# Patient Record
Sex: Male | Born: 1940 | Race: White | Hispanic: No | Marital: Married | Smoking: Former smoker
Health system: Southern US, Community
[De-identification: ages and names within clinical notes are randomized; demographics above are authoritative.]

## PROBLEM LIST (undated history)

## (undated) DIAGNOSIS — I509 Heart failure, unspecified: Secondary | ICD-10-CM

## (undated) DIAGNOSIS — I2510 Atherosclerotic heart disease of native coronary artery without angina pectoris: Secondary | ICD-10-CM

## (undated) DIAGNOSIS — I779 Disorder of arteries and arterioles, unspecified: Secondary | ICD-10-CM

## (undated) DIAGNOSIS — I5020 Unspecified systolic (congestive) heart failure: Secondary | ICD-10-CM

## (undated) DIAGNOSIS — I255 Ischemic cardiomyopathy: Secondary | ICD-10-CM

## (undated) DIAGNOSIS — I447 Left bundle-branch block, unspecified: Secondary | ICD-10-CM

## (undated) DIAGNOSIS — Z87891 Personal history of nicotine dependence: Secondary | ICD-10-CM

## (undated) DIAGNOSIS — Z5189 Encounter for other specified aftercare: Secondary | ICD-10-CM

## (undated) DIAGNOSIS — R011 Cardiac murmur, unspecified: Secondary | ICD-10-CM

## (undated) DIAGNOSIS — R001 Bradycardia, unspecified: Secondary | ICD-10-CM

## (undated) DIAGNOSIS — I739 Peripheral vascular disease, unspecified: Secondary | ICD-10-CM

## (undated) DIAGNOSIS — I771 Stricture of artery: Secondary | ICD-10-CM

## (undated) DIAGNOSIS — I350 Nonrheumatic aortic (valve) stenosis: Secondary | ICD-10-CM

## (undated) DIAGNOSIS — E119 Type 2 diabetes mellitus without complications: Secondary | ICD-10-CM

## (undated) DIAGNOSIS — S2239XA Fracture of one rib, unspecified side, initial encounter for closed fracture: Secondary | ICD-10-CM

## (undated) DIAGNOSIS — K922 Gastrointestinal hemorrhage, unspecified: Secondary | ICD-10-CM

## (undated) DIAGNOSIS — D689 Coagulation defect, unspecified: Secondary | ICD-10-CM

## (undated) DIAGNOSIS — I10 Essential (primary) hypertension: Secondary | ICD-10-CM

## (undated) DIAGNOSIS — D649 Anemia, unspecified: Secondary | ICD-10-CM

## (undated) HISTORY — DX: Ischemic cardiomyopathy: I25.5

## (undated) HISTORY — DX: Peripheral vascular disease, unspecified: I73.9

## (undated) HISTORY — PX: CORONARY ANGIOPLASTY: SHX604

## (undated) HISTORY — DX: Encounter for other specified aftercare: Z51.89

## (undated) HISTORY — DX: Atherosclerotic heart disease of native coronary artery without angina pectoris: I25.10

## (undated) HISTORY — DX: Disorder of arteries and arterioles, unspecified: I77.9

## (undated) HISTORY — DX: Cardiac murmur, unspecified: R01.1

## (undated) HISTORY — DX: Personal history of nicotine dependence: Z87.891

## (undated) HISTORY — DX: Gastrointestinal hemorrhage, unspecified: K92.2

## (undated) HISTORY — DX: Bradycardia, unspecified: R00.1

## (undated) HISTORY — DX: Left bundle-branch block, unspecified: I44.7

## (undated) HISTORY — DX: Coagulation defect, unspecified: D68.9

## (undated) HISTORY — DX: Nonrheumatic aortic (valve) stenosis: I35.0

## (undated) HISTORY — DX: Unspecified systolic (congestive) heart failure: I50.20

## (undated) HISTORY — PX: CARDIAC CATHETERIZATION: SHX172

---
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
## 2009-05-06 ENCOUNTER — Emergency Department: Payer: Self-pay | Admitting: Emergency Medicine

## 2010-02-01 DIAGNOSIS — F1011 Alcohol abuse, in remission: Secondary | ICD-10-CM | POA: Insufficient documentation

## 2010-06-18 ENCOUNTER — Emergency Department: Payer: Self-pay | Admitting: Emergency Medicine

## 2011-02-13 DIAGNOSIS — L219 Seborrheic dermatitis, unspecified: Secondary | ICD-10-CM | POA: Insufficient documentation

## 2011-08-05 ENCOUNTER — Emergency Department: Payer: Self-pay

## 2011-08-07 ENCOUNTER — Emergency Department: Payer: Self-pay | Admitting: Emergency Medicine

## 2015-12-14 DIAGNOSIS — I6523 Occlusion and stenosis of bilateral carotid arteries: Secondary | ICD-10-CM | POA: Insufficient documentation

## 2015-12-14 DIAGNOSIS — IMO0001 Reserved for inherently not codable concepts without codable children: Secondary | ICD-10-CM | POA: Insufficient documentation

## 2016-02-09 DIAGNOSIS — R001 Bradycardia, unspecified: Secondary | ICD-10-CM | POA: Insufficient documentation

## 2016-04-04 DIAGNOSIS — N400 Enlarged prostate without lower urinary tract symptoms: Secondary | ICD-10-CM | POA: Insufficient documentation

## 2016-06-25 DIAGNOSIS — L570 Actinic keratosis: Secondary | ICD-10-CM | POA: Insufficient documentation

## 2017-10-13 ENCOUNTER — Emergency Department: Payer: Medicare Other

## 2017-10-13 ENCOUNTER — Other Ambulatory Visit: Payer: Self-pay

## 2017-10-13 ENCOUNTER — Encounter: Payer: Self-pay | Admitting: Emergency Medicine

## 2017-10-13 ENCOUNTER — Emergency Department
Admission: EM | Admit: 2017-10-13 | Discharge: 2017-10-13 | Disposition: A | Discharge status: Home / Self Care | Payer: Medicare Other | Attending: Emergency Medicine | Admitting: Emergency Medicine

## 2017-10-13 DIAGNOSIS — Z7984 Long term (current) use of oral hypoglycemic drugs: Secondary | ICD-10-CM | POA: Insufficient documentation

## 2017-10-13 DIAGNOSIS — M25461 Effusion, right knee: Secondary | ICD-10-CM | POA: Diagnosis not present

## 2017-10-13 DIAGNOSIS — E119 Type 2 diabetes mellitus without complications: Secondary | ICD-10-CM | POA: Insufficient documentation

## 2017-10-13 DIAGNOSIS — W08XXXA Fall from other furniture, initial encounter: Secondary | ICD-10-CM | POA: Insufficient documentation

## 2017-10-13 DIAGNOSIS — I10 Essential (primary) hypertension: Secondary | ICD-10-CM | POA: Diagnosis not present

## 2017-10-13 DIAGNOSIS — Z79899 Other long term (current) drug therapy: Secondary | ICD-10-CM | POA: Insufficient documentation

## 2017-10-13 DIAGNOSIS — Z87891 Personal history of nicotine dependence: Secondary | ICD-10-CM | POA: Diagnosis not present

## 2017-10-13 DIAGNOSIS — M25561 Pain in right knee: Secondary | ICD-10-CM | POA: Diagnosis present

## 2017-10-13 HISTORY — DX: Essential (primary) hypertension: I10

## 2017-10-13 HISTORY — DX: Type 2 diabetes mellitus without complications: E11.9

## 2017-10-13 HISTORY — DX: Fracture of one rib, unspecified side, initial encounter for closed fracture: S22.39XA

## 2017-10-13 MED ORDER — TRAMADOL HCL 50 MG PO TABS: 50.0000 mg | ORAL_TABLET | Freq: Four times a day (QID) | ORAL | 0 refills | Status: DC | PRN

## 2017-10-13 NOTE — ED Provider Notes (Signed)
[facility] Emergency Department Provider Note ____________________________________________  Time seen: Approximately 12:56 PM  I have reviewed the triage vital signs and the nursing notes.   HISTORY  Chief Complaint Fall and Knee Injury    HPI Kevin Case is a 76 y.o. male who presents to the emergency department for evaluation and treatment after sustaining a mechanical, non-syncopal fall 5 days ago.  While inside hanging Christmas lights, he fell off of a bench and landed on a hardwood floor.  His back initially hurt as well, however that has since resolved but the knee continues to be painful and is still swollen.  He took an ibuprofen this morning, but otherwise has not taken any medications for his pain.  He attempted to apply neoprene knee sleeve, but removed it because it made the knee more painful. Past Medical History:  Diagnosis Date  . Diabetes mellitus without complication (HCC)   . Hypertension   . Rib fracture     There are no active problems to display for this patient.   History reviewed. No pertinent surgical history.  Prior to Admission medications   Medication Sig Start Date End Date Taking? Authorizing Provider  hydrochlorothiazide (HYDRODIURIL) 25 MG tablet Take 25 mg by mouth daily.   Yes [provider]  metFORMIN (GLUCOPHAGE) 500 MG tablet Take 500 mg by mouth 2 (two) times daily with a meal.   Yes [provider]  omeprazole (PRILOSEC) 20 MG capsule Take 20 mg by mouth daily.   Yes [provider]  traMADol (ULTRAM) 50 MG tablet Take 1 tablet (50 mg total) by mouth every 6 (six) hours as needed. 10/13/17   Chinita Pester, FNP    Allergies Patient has no known allergies.  No family history on file.  Social History Social History   Tobacco Use  . Smoking status: Former Games developer  . Smokeless tobacco: Never Used  Substance Use Topics  . Alcohol use: No    Frequency: Never  . Drug use: Not on  file    Review of Systems Constitutional: Positive for recent injury Cardiovascular: Negative for chest pain Respiratory: Negative for shortness of breath Musculoskeletal: Positive for right knee pain Skin: Positive for swelling of the right knee  Neurological: Negative for loss of consciousness  ____________________________________________   PHYSICAL EXAM:  VITAL SIGNS: ED Triage Vitals  Enc Vitals Group     BP 10/13/17 1140 (!) 135/52     Pulse Rate 10/13/17 1140 66     Resp 10/13/17 1140 16     Temp 10/13/17 1140 97.6 F (36.4 C)     Temp Source 10/13/17 1140 Oral     SpO2 10/13/17 1140 100 %     Weight 10/13/17 1140 157 lb (71.2 kg)     Height 10/13/17 1140 5\' 9"  (1.753 m)     Head Circumference --      Peak Flow --      Pain Score 10/13/17 1139 3     Pain Loc --      Pain Edu? --      Excl. in GC? --     Constitutional: Alert and oriented. Well appearing and in no acute distress. Eyes: Conjunctivae are clear without discharge or drainage Head: Atraumatic Neck: Supple with full range of motion observed Respiratory: Respirations even and unlabored Musculoskeletal: Palpable prepatellar effusion of the right knee without obvious deformity. Neurologic: Sensation is intact. Skin: Intact Psychiatric: Affect and behavior are appropriate.  ____________________________________________   LABS (all  labs ordered are listed, but only abnormal results are displayed)  Labs Reviewed - No data to display ____________________________________________  RADIOLOGY  Image of the right knee is negative for acute bony abnormality per radiology. ____________________________________________   PROCEDURES  .Splint Application Date/Time: 10/13/2017 3:54 PM Performed by: Chinita Pesterriplett, Jacobi Nile B, FNP Authorized by: Chinita Pesterriplett, Pauline Pegues B, FNP   Consent:    Consent obtained:  Verbal   Consent given by:  Patient   Risks discussed:  Pain   Alternatives discussed:  Referral Pre-procedure  details:    Sensation:  Normal Procedure details:    Laterality:  Right   Location:  Knee   Splint type:  Knee immobilizer Post-procedure details:    Pain:  Unchanged   Sensation:  Normal   Patient tolerance of procedure:  Tolerated well, no immediate complications   ____________________________________________   INITIAL IMPRESSION / ASSESSMENT AND PLAN / ED COURSE  [name] is a 76 y.o. male who presents to the emergency department 5 days after a mechanical, non-syncopal fall.  X-rays do not reveal acute bony abnormality.  Knee immobilizer to be applied and the patient was advised to rest, ice, and elevate the right lower extremity for the next several days.  He was instructed to call and schedule an appointment with orthopedics if he has not had any improvement over the next 5-7 days.  He is currently using a cane and was encouraged to continue to do so.  He was advised to follow-up with his primary care provider or return to the emergency department for symptoms of concern if he is unable to see orthopedics.  Medications - No data to display  Pertinent labs & imaging results that were available during my care of the patient were reviewed by me and considered in my medical decision making (see chart for details).  _________________________________________   FINAL CLINICAL IMPRESSION(S) / ED DIAGNOSES  Final diagnoses:  Effusion of right knee    ED Discharge Orders        Ordered    traMADol (ULTRAM) 50 MG tablet  Every 6 hours PRN     10/13/17 1255       If controlled substance prescribed during this visit, 12 month history viewed on the [facility] prior to issuing an initial prescription for Schedule II or III opiod.    Chinita Pesterriplett, Berish Bohman B, FNP 10/13/17 1600    Sharman CheekStafford, Phillip, MD 10/15/17 2324

## 2017-10-13 NOTE — ED Triage Notes (Signed)
Says fell off a bench while hanging christmas decorations about 5 days ago.  Says his back got better with heat, but his right knee is still swollen.

## 2017-10-13 NOTE — ED Notes (Signed)
Pt ambulatory with cane and knee immobilizer to wheelchair. Pt states "this is 100% better with this brace on." Pt and family verbalized understanding of discharge instructions, follow-up care and prescription. VSS. Skin warm and dry. A&O x4. This RN helped pt to family vehicle.

## 2017-10-13 NOTE — ED Notes (Signed)
See triage note  Presents with pain and swelling to right knee s/p fall about 5 days ago   States he fell on wood floor

## 2018-01-29 DIAGNOSIS — E039 Hypothyroidism, unspecified: Secondary | ICD-10-CM | POA: Insufficient documentation

## 2018-01-29 DIAGNOSIS — E038 Other specified hypothyroidism: Secondary | ICD-10-CM | POA: Insufficient documentation

## 2018-11-24 DIAGNOSIS — K219 Gastro-esophageal reflux disease without esophagitis: Secondary | ICD-10-CM | POA: Insufficient documentation

## 2019-03-12 ENCOUNTER — Emergency Department: Payer: Medicare Other

## 2019-03-12 ENCOUNTER — Other Ambulatory Visit: Payer: Self-pay

## 2019-03-12 ENCOUNTER — Inpatient Hospital Stay
Admission: EM | Admit: 2019-03-12 | Discharge: 2019-03-16 | DRG: 280 | Disposition: A | Discharge status: Home / Self Care | Payer: Medicare Other | Attending: Family Medicine | Admitting: Family Medicine

## 2019-03-12 DIAGNOSIS — J9601 Acute respiratory failure with hypoxia: Secondary | ICD-10-CM | POA: Diagnosis present

## 2019-03-12 DIAGNOSIS — I2510 Atherosclerotic heart disease of native coronary artery without angina pectoris: Secondary | ICD-10-CM | POA: Diagnosis present

## 2019-03-12 DIAGNOSIS — I429 Cardiomyopathy, unspecified: Secondary | ICD-10-CM | POA: Diagnosis present

## 2019-03-12 DIAGNOSIS — N179 Acute kidney failure, unspecified: Secondary | ICD-10-CM | POA: Diagnosis present

## 2019-03-12 DIAGNOSIS — I350 Nonrheumatic aortic (valve) stenosis: Secondary | ICD-10-CM | POA: Diagnosis present

## 2019-03-12 DIAGNOSIS — Z87891 Personal history of nicotine dependence: Secondary | ICD-10-CM

## 2019-03-12 DIAGNOSIS — I5021 Acute systolic (congestive) heart failure: Secondary | ICD-10-CM

## 2019-03-12 DIAGNOSIS — E119 Type 2 diabetes mellitus without complications: Secondary | ICD-10-CM | POA: Diagnosis present

## 2019-03-12 DIAGNOSIS — R0602 Shortness of breath: Secondary | ICD-10-CM

## 2019-03-12 DIAGNOSIS — I708 Atherosclerosis of other arteries: Secondary | ICD-10-CM | POA: Diagnosis present

## 2019-03-12 DIAGNOSIS — I110 Hypertensive heart disease with heart failure: Secondary | ICD-10-CM | POA: Diagnosis present

## 2019-03-12 DIAGNOSIS — I5031 Acute diastolic (congestive) heart failure: Secondary | ICD-10-CM | POA: Diagnosis not present

## 2019-03-12 DIAGNOSIS — Z8249 Family history of ischemic heart disease and other diseases of the circulatory system: Secondary | ICD-10-CM | POA: Diagnosis not present

## 2019-03-12 DIAGNOSIS — I214 Non-ST elevation (NSTEMI) myocardial infarction: Principal | ICD-10-CM

## 2019-03-12 DIAGNOSIS — Z7984 Long term (current) use of oral hypoglycemic drugs: Secondary | ICD-10-CM

## 2019-03-12 DIAGNOSIS — E785 Hyperlipidemia, unspecified: Secondary | ICD-10-CM | POA: Diagnosis present

## 2019-03-12 DIAGNOSIS — I5043 Acute on chronic combined systolic (congestive) and diastolic (congestive) heart failure: Secondary | ICD-10-CM | POA: Diagnosis present

## 2019-03-12 DIAGNOSIS — J811 Chronic pulmonary edema: Secondary | ICD-10-CM

## 2019-03-12 DIAGNOSIS — I447 Left bundle-branch block, unspecified: Secondary | ICD-10-CM | POA: Diagnosis present

## 2019-03-12 DIAGNOSIS — Z5320 Procedure and treatment not carried out because of patient's decision for unspecified reasons: Secondary | ICD-10-CM | POA: Diagnosis present

## 2019-03-12 DIAGNOSIS — I959 Hypotension, unspecified: Secondary | ICD-10-CM | POA: Diagnosis not present

## 2019-03-12 DIAGNOSIS — Z20828 Contact with and (suspected) exposure to other viral communicable diseases: Secondary | ICD-10-CM | POA: Diagnosis present

## 2019-03-12 DIAGNOSIS — R7989 Other specified abnormal findings of blood chemistry: Secondary | ICD-10-CM | POA: Diagnosis not present

## 2019-03-12 LAB — BLOOD GAS, ARTERIAL
Acid-base deficit: 3 mmol/L — ABNORMAL HIGH (ref 0.0–2.0)
Bicarbonate: 21.1 mmol/L (ref 20.0–28.0)
Delivery systems: POSITIVE
Expiratory PAP: 6
FIO2: 50
Inspiratory PAP: 12
O2 Saturation: 95.4 %
Patient temperature: 37
pCO2 arterial: 34 mmHg (ref 32.0–48.0)
pH, Arterial: 7.4 (ref 7.350–7.450)
pO2, Arterial: 78 mmHg — ABNORMAL LOW (ref 83.0–108.0)

## 2019-03-12 LAB — CBC WITH DIFFERENTIAL/PLATELET
Abs Immature Granulocytes: 0.04 10*3/uL (ref 0.00–0.07)
Basophils Absolute: 0 10*3/uL (ref 0.0–0.1)
Basophils Relative: 0 %
Eosinophils Absolute: 0.3 10*3/uL (ref 0.0–0.5)
Eosinophils Relative: 2 %
HCT: 36 % — ABNORMAL LOW (ref 39.0–52.0)
Hemoglobin: 10.9 g/dL — ABNORMAL LOW (ref 13.0–17.0)
Immature Granulocytes: 0 %
Lymphocytes Relative: 26 %
Lymphs Abs: 2.7 10*3/uL (ref 0.7–4.0)
MCH: 25.3 pg — ABNORMAL LOW (ref 26.0–34.0)
MCHC: 30.3 g/dL (ref 30.0–36.0)
MCV: 83.5 fL (ref 80.0–100.0)
Monocytes Absolute: 0.9 10*3/uL (ref 0.1–1.0)
Monocytes Relative: 8 %
Neutro Abs: 6.4 10*3/uL (ref 1.7–7.7)
Neutrophils Relative %: 64 %
Platelets: 218 10*3/uL (ref 150–400)
RBC: 4.31 MIL/uL (ref 4.22–5.81)
RDW: 15.2 % (ref 11.5–15.5)
WBC: 10.3 10*3/uL (ref 4.0–10.5)
nRBC: 0 % (ref 0.0–0.2)

## 2019-03-12 LAB — COMPREHENSIVE METABOLIC PANEL
ALT: 34 U/L (ref 0–44)
AST: 49 U/L — ABNORMAL HIGH (ref 15–41)
Albumin: 3.5 g/dL (ref 3.5–5.0)
Alkaline Phosphatase: 67 U/L (ref 38–126)
Anion gap: 11 (ref 5–15)
BUN: 26 mg/dL — ABNORMAL HIGH (ref 8–23)
CO2: 19 mmol/L — ABNORMAL LOW (ref 22–32)
Calcium: 8.3 mg/dL — ABNORMAL LOW (ref 8.9–10.3)
Chloride: 110 mmol/L (ref 98–111)
Creatinine, Ser: 1.27 mg/dL — ABNORMAL HIGH (ref 0.61–1.24)
GFR calc Af Amer: >60 mL/min (ref >60)
GFR calc non Af Amer: 55 mL/min — ABNORMAL LOW (ref >60)
Glucose, Bld: 188 mg/dL — ABNORMAL HIGH (ref 70–99)
Potassium: 3.7 mmol/L (ref 3.5–5.1)
Sodium: 140 mmol/L (ref 135–145)
Total Bilirubin: 0.9 mg/dL (ref 0.3–1.2)
Total Protein: 7.3 g/dL (ref 6.5–8.1)

## 2019-03-12 LAB — BRAIN NATRIURETIC PEPTIDE: B Natriuretic Peptide: 864 pg/mL — ABNORMAL HIGH (ref 0.0–100.0)

## 2019-03-12 LAB — ETHANOL: Alcohol, Ethyl (B): <10 mg/dL (ref <10)

## 2019-03-12 LAB — TROPONIN I
Troponin I: 0.06 ng/mL (ref <0.03)
Troponin I: 1.6 ng/mL (ref <0.03)

## 2019-03-12 LAB — GLUCOSE, CAPILLARY: Glucose-Capillary: 138 mg/dL — ABNORMAL HIGH (ref 70–99)

## 2019-03-12 LAB — SARS CORONAVIRUS 2 BY RT PCR (HOSPITAL ORDER, PERFORMED IN ~~LOC~~ HOSPITAL LAB): SARS Coronavirus 2: NEGATIVE

## 2019-03-12 LAB — PROCALCITONIN: Procalcitonin: <0.1 ng/mL

## 2019-03-12 LAB — PROTIME-INR
INR: 1.1 (ref 0.8–1.2)
Prothrombin Time: 14.4 seconds (ref 11.4–15.2)

## 2019-03-12 LAB — APTT: aPTT: 27 seconds (ref 24–36)

## 2019-03-12 MED ORDER — ACETAMINOPHEN 650 MG RE SUPP: 650.0000 mg | Freq: Four times a day (QID) | RECTAL | Status: DC | PRN

## 2019-03-12 MED ORDER — ONDANSETRON HCL 4 MG/2ML IJ SOLN: 4.0000 mg | Freq: Four times a day (QID) | INTRAMUSCULAR | Status: DC | PRN

## 2019-03-12 MED ORDER — ENOXAPARIN SODIUM 40 MG/0.4ML ~~LOC~~ SOLN
40.0000 mg | SUBCUTANEOUS | Status: DC
  Administered 2019-03-12: 40 mg via SUBCUTANEOUS
  Filled 2019-03-12: qty 0.4

## 2019-03-12 MED ORDER — ONDANSETRON HCL 4 MG PO TABS: 4.0000 mg | ORAL_TABLET | Freq: Four times a day (QID) | ORAL | Status: DC | PRN

## 2019-03-12 MED ORDER — ACETAMINOPHEN 325 MG PO TABS
650.0000 mg | ORAL_TABLET | Freq: Four times a day (QID) | ORAL | Status: DC | PRN
  Administered 2019-03-14 (×2): 650 mg via ORAL
  Filled 2019-03-12 (×2): qty 2

## 2019-03-12 MED ORDER — INSULIN ASPART 100 UNIT/ML ~~LOC~~ SOLN
0.0000 [IU] | Freq: Three times a day (TID) | SUBCUTANEOUS | Status: DC
  Administered 2019-03-13: 2 [IU] via SUBCUTANEOUS
  Administered 2019-03-13 – 2019-03-14 (×2): 1 [IU] via SUBCUTANEOUS
  Administered 2019-03-14: 3 [IU] via SUBCUTANEOUS
  Administered 2019-03-14 – 2019-03-16 (×3): 1 [IU] via SUBCUTANEOUS
  Filled 2019-03-12 (×8): qty 1

## 2019-03-12 MED ORDER — ATORVASTATIN CALCIUM 20 MG PO TABS
80.0000 mg | ORAL_TABLET | Freq: Every day | ORAL | Status: DC
  Administered 2019-03-13 – 2019-03-15 (×3): 80 mg via ORAL
  Filled 2019-03-12 (×3): qty 4

## 2019-03-12 MED ORDER — ASPIRIN EC 81 MG PO TBEC
162.0000 mg | DELAYED_RELEASE_TABLET | Freq: Once | ORAL | Status: AC
  Administered 2019-03-12: 162 mg via ORAL

## 2019-03-12 MED ORDER — FUROSEMIDE 10 MG/ML IJ SOLN
40.0000 mg | Freq: Every day | INTRAMUSCULAR | Status: DC
  Administered 2019-03-13: 40 mg via INTRAVENOUS
  Filled 2019-03-12: qty 4

## 2019-03-12 MED ORDER — FUROSEMIDE 10 MG/ML IJ SOLN
40.0000 mg | Freq: Once | INTRAMUSCULAR | Status: AC
  Administered 2019-03-12: 40 mg via INTRAVENOUS
  Filled 2019-03-12: qty 4

## 2019-03-12 MED ORDER — HYDRALAZINE HCL 20 MG/ML IJ SOLN: 5.0000 mg | INTRAMUSCULAR | Status: DC | PRN

## 2019-03-12 MED ORDER — PANTOPRAZOLE SODIUM 40 MG PO TBEC
80.0000 mg | DELAYED_RELEASE_TABLET | Freq: Every day | ORAL | Status: DC
  Administered 2019-03-13 – 2019-03-16 (×3): 80 mg via ORAL
  Filled 2019-03-12 (×3): qty 2

## 2019-03-12 MED ORDER — INSULIN ASPART 100 UNIT/ML ~~LOC~~ SOLN: 0.0000 [IU] | Freq: Every day | SUBCUTANEOUS | Status: DC

## 2019-03-12 MED ORDER — POLYETHYLENE GLYCOL 3350 17 G PO PACK: 17.0000 g | PACK | Freq: Every day | ORAL | Status: DC | PRN

## 2019-03-12 MED ORDER — HEPARIN (PORCINE) 25000 UT/250ML-% IV SOLN
1050.0000 [IU]/h | INTRAVENOUS | Status: DC
  Administered 2019-03-12 – 2019-03-14 (×3): 1050 [IU]/h via INTRAVENOUS
  Filled 2019-03-12 (×3): qty 250

## 2019-03-12 NOTE — ED Notes (Signed)
Pt taken off bipap, on 4L [place] at 94% sats

## 2019-03-12 NOTE — ED Notes (Signed)
Pt placed on Bipap

## 2019-03-12 NOTE — ED Notes (Signed)
ED TO INPATIENT HANDOFF REPORT  ED Nurse Name and Phone #: Berline Lopes [phone]  S Name/Age/Gender Kevin Case 78 y.o. male Room/Bed: ED01A/ED01A  Code Status   Code Status: Not on file  Home/SNF/Other Home Patient oriented to: self, place, time and situation Is this baseline? Yes   Triage Complete: Triage complete  Chief Complaint CODE STEMI  Triage Note Pt to ED via EMS from home with c/o chest pain sob, and cough for 2-3 days. Upon EMS arrival to pts home pt was in respiratory distress and in flash pulmonary edema after walking up set of stairs. Pt has hx LBBB, pt was given 1 inch nitro paste and fentanyl by ems. Pt placed on NRB 15L by ems not on o2 chronically, ems states sats were in the 70s on their arrival. Pt arrives at 92% NRB, MD McShane at bedside orders for biapap.    Allergies No Known Allergies  Level of Care/Admitting Diagnosis ED Disposition    ED Disposition Condition Comment   Admit  Hospital Area: [facility] [[id number]]  Level of Care: Telemetry [5]  Covid Evaluation: N/A  Diagnosis: Acute respiratory failure with hypoxia [facility]) [[id number]]  Admitting Physician: Willadean Carol DODD [[id number]]  Attending Physician: Willadean Carol DODD [[id number]]  Estimated length of stay: past midnight tomorrow  Certification:: I certify this patient will need inpatient services for at least 2 midnights  PT Class (Do Not Modify): Inpatient [101]  PT Acc Code (Do Not Modify): Private [1]       B Medical/Surgery History Past Medical History:  Diagnosis Date  . Diabetes mellitus without complication (HCC)   . Hypertension   . Rib fracture    History reviewed. No pertinent surgical history.   A IV Location/Drains/Wounds Patient Lines/Drains/Airways Status   Active Line/Drains/Airways    Name:   Placement date:   Placement time:   Site:   Days:   Peripheral IV 03/12/19 Left Forearm   03/12/19    1653    Forearm   less than 1   Peripheral IV  03/12/19 Right Forearm   03/12/19    1653    Forearm   less than 1          Intake/Output Last 24 hours No intake or output data in the 24 hours ending 03/12/19 1831  Labs/Imaging Results for orders placed or performed during the hospital encounter of 03/12/19 (from the past 48 hour(s))  CBC with Differential     Status: Abnormal   Collection Time: 03/12/19  4:44 PM  Result Value Ref Range   WBC 10.3 4.0 - 10.5 K/uL   RBC 4.31 4.22 - 5.81 MIL/uL   Hemoglobin 10.9 (L) 13.0 - 17.0 g/dL   HCT 92.4 (L) 46.2 - 86.3 %   MCV 83.5 80.0 - 100.0 fL   MCH 25.3 (L) 26.0 - 34.0 pg   MCHC 30.3 30.0 - 36.0 g/dL   RDW 81.7 71.1 - 65.7 %   Platelets 218 150 - 400 K/uL   nRBC 0.0 0.0 - 0.2 %   Neutrophils Relative % 64 %   Neutro Abs 6.4 1.7 - 7.7 K/uL   Lymphocytes Relative 26 %   Lymphs Abs 2.7 0.7 - 4.0 K/uL   Monocytes Relative 8 %   Monocytes Absolute 0.9 0.1 - 1.0 K/uL   Eosinophils Relative 2 %   Eosinophils Absolute 0.3 0.0 - 0.5 K/uL   Basophils Relative 0 %   Basophils Absolute 0.0 0.0 - 0.1 K/uL  Immature Granulocytes 0 %   Abs Immature Granulocytes 0.04 0.00 - 0.07 K/uL    Comment: Performed at [facility], [address] [place], [place] [address]  Troponin I - Once     Status: Abnormal   Collection Time: 03/12/19  4:44 PM  Result Value Ref Range   Troponin I 0.06 (HH) <0.03 ng/mL    Comment: CRITICAL RESULT CALLED TO, READ BACK BY AND VERIFIED WITH GRACIE [facility] AT 1724 03/12/2019.PMF Performed at [facility], [address] [place], [place] [address]   Comprehensive metabolic panel     Status: Abnormal   Collection Time: 03/12/19  4:44 PM  Result Value Ref Range   Sodium 140 135 - 145 mmol/L   Potassium 3.7 3.5 - 5.1 mmol/L   Chloride 110 98 - 111 mmol/L   CO2 19 (L) 22 - 32 mmol/L   Glucose, Bld 188 (H) 70 - 99 mg/dL   BUN 26 (H) 8 - 23 mg/dL   Creatinine, Ser 7.94 (H) 0.61 - 1.24 mg/dL   Calcium 8.3 (L) 8.9 - 10.3 mg/dL   Total Protein 7.3  6.5 - 8.1 g/dL   Albumin 3.5 3.5 - 5.0 g/dL   AST 49 (H) 15 - 41 U/L   ALT 34 0 - 44 U/L   Alkaline Phosphatase 67 38 - 126 U/L   Total Bilirubin 0.9 0.3 - 1.2 mg/dL   GFR calc non Af Amer 55 (L) >60 mL/min   GFR calc Af Amer >60 >60 mL/min   Anion gap 11 5 - 15    Comment: Performed at [facility], [address], [place], [place] [address]  Brain natriuretic peptide     Status: Abnormal   Collection Time: 03/12/19  4:44 PM  Result Value Ref Range   B Natriuretic Peptide 864.0 (H) 0.0 - 100.0 pg/mL    Comment: Performed at [facility], [address] [place], [place] [address]  Protime-INR     Status: None   Collection Time: 03/12/19  4:44 PM  Result Value Ref Range   Prothrombin Time 14.4 11.4 - 15.2 seconds   INR 1.1 0.8 - 1.2    Comment: (NOTE) INR goal varies based on device and disease states. Performed at [facility], [address] [place], [place] [address]   APTT     Status: None   Collection Time: 03/12/19  4:44 PM  Result Value Ref Range   aPTT 27 24 - 36 seconds    Comment: Performed at [facility], [address] [place], [place] [address]  Ethanol     Status: None   Collection Time: 03/12/19  4:44 PM  Result Value Ref Range   Alcohol, Ethyl (B) <10 <10 mg/dL    Comment: (NOTE) Lowest detectable limit for serum alcohol is 10 mg/dL. For medical purposes only. Performed at [facility], [address] [place], [place] [address]   Blood gas, arterial     Status: Abnormal   Collection Time: 03/12/19  5:10 PM  Result Value Ref Range   FIO2 50.00    Delivery systems BILEVEL POSITIVE AIRWAY PRESSURE    Inspiratory PAP 12    Expiratory PAP 6    pH, Arterial 7.40 7.350 - 7.450   pCO2 arterial 34 32.0 - 48.0 mmHg   pO2, Arterial 78 (L) 83.0 - 108.0 mmHg   Bicarbonate 21.1 20.0 - 28.0 mmol/L   Acid-base deficit 3.0 (H) 0.0 - 2.0 mmol/L  O2 Saturation 95.4 %   Patient temperature 37.0    Collection site  LEFT RADIAL    Sample type ARTERIAL DRAW    Allens test (pass/fail) PASS PASS    Comment: Performed at [facility], [address] [place] [place], [place] [address]   Dg Chest Port 1 View  Result Date: 03/12/2019 CLINICAL DATA:  Acute chest pain and cough. EXAM: PORTABLE CHEST 1 VIEW COMPARISON:  08/07/2011 FINDINGS: Diffuse bilateral airspace opacities are noted. The cardiomediastinal silhouette is unremarkable. There may be trace bilateral pleural effusions present. No pneumothorax or acute bony abnormality. IMPRESSION: Diffuse bilateral airspace opacities which may represent diffuse edema versus infection. Electronically Signed   By: Harmon Pier M.D.   On: 03/12/2019 17:11    Pending Labs Unresulted Labs (From admission, onward)    Start     Ordered   03/12/19 1643  SARS Coronavirus 2 (CEPHEID - Performed in [facility] hospital lab), Hosp Order  (Asymptomatic Patients Labs)  Once,   STAT    Question:  Rule Out  Answer:  Yes   03/12/19 1642   Signed and Held  Basic metabolic panel  Tomorrow morning,   R     Signed and Held   Signed and Held  CBC  Tomorrow morning,   R     Signed and Held   Signed and Held  Troponin I - Now Then Q6H  Now then every 6 hours,   R     Signed and Held   Signed and Held  Procalcitonin - Baseline  ONCE - STAT,   STAT     Signed and Held          Vitals/Pain Today's Vitals   03/12/19 1639 03/12/19 1640 03/12/19 1642 03/12/19 1700  BP:  139/84  110/73  Pulse:    88  Resp:    (!) 27  Temp:   97.8 F (36.6 C)   TempSrc:   Oral   SpO2:    96%  Weight: 73.9 kg     Height:  (1.753 m)     PainSc: 3        Isolation Precautions No active isolations  Medications Medications  aspirin EC tablet 162 mg (162 mg Oral Given 03/12/19 1653)  furosemide (LASIX) injection 40 mg (40 mg Intravenous Given 03/12/19 1746)    Mobility walks with person assist Low fall risk   Focused Assessments Pulmonary Assessment Handoff:  Lung sounds: Bilateral  Breath Sounds: Rales O2 Device: Bi-PAP        R Recommendations: See Admitting Provider Note  Report given to:   Additional Notes:

## 2019-03-12 NOTE — ED Provider Notes (Addendum)
[facility] Emergency Department Provider Note  ____________________________________________   I have reviewed the triage vital signs and the nursing notes. Where available I have reviewed prior notes and, if possible and indicated, outside hospital notes.    HISTORY  Chief Complaint Shortness of Breath    HPI Kevin Case is a 78 y.o. male  With a history of diabetes mellitus hypertension left bundle branch block, who also has a remote history of EtOH abuse presents today complaining of shortness of breath and chest pain which is a pressure, nonradiating, has been there off and on for the last 3 days.  Exertional.  Now constant for the last several hours.  No nausea no vomiting.  No leg swelling.  Is on Lasix.  He is somewhat limited due to patient respiratory status.  EMS found him with a oxygen saturation in the 80s or 70s gave him a nonrebreather, he is come off and is oxygen, he is also found to actually hypertensive for them: They did give him fentanyl, they gave him nitroglycerin, he took 2 baby aspirin's prior to EMS arrival.  Patient rates his pain down to about a 2 or 3 at this time and feels better although he still having difficulty breathing he feels that is improved as well.  No fever, no productive cough.  No known COVID exposures.  EKG was transmitted by EMS and reviewed by Dr. Kirke Corin.  Who felt that this was consistent with a old left bundle branch block and did not wish to activate cath lab.  Patient is full code. His med list that he brought with him including his medications does show Lasix which it appears that he is been taking but he states he is not on a fluid pill or on Lasix.  However Lasix is 1 of his meds  Past Medical History:  Diagnosis Date  . Diabetes mellitus without complication (HCC)   . Hypertension   . Rib fracture     There are no active problems to display for this patient.   History reviewed. No pertinent surgical  history.  Prior to Admission medications   Medication Sig Start Date End Date Taking? Authorizing Provider  hydrochlorothiazide (HYDRODIURIL) 25 MG tablet Take 25 mg by mouth daily.    [provider]  metFORMIN (GLUCOPHAGE) 500 MG tablet Take 500 mg by mouth 2 (two) times daily with a meal.    [provider]  omeprazole (PRILOSEC) 20 MG capsule Take 20 mg by mouth daily.    [provider]  traMADol (ULTRAM) 50 MG tablet Take 1 tablet (50 mg total) by mouth every 6 (six) hours as needed. 10/13/17   Chinita Pester, FNP    Allergies Patient has no known allergies.  No family history on file.  Social History Social History   Tobacco Use  . Smoking status: Former Games developer  . Smokeless tobacco: Never Used  Substance Use Topics  . Alcohol use: No    Frequency: Never  . Drug use: Not on file    Review of Systems Constitutional: No fever/chills Eyes: No visual changes. ENT: No sore throat. No stiff neck no neck pain Cardiovascular: + chest pain. Respiratory: + shortness of breath. Gastrointestinal:   no vomiting.  No diarrhea.  No constipation. Genitourinary: Negative for dysuria. Musculoskeletal: Negative lower extremity swelling Skin: Negative for rash. Neurological: Negative for severe headaches, focal weakness or numbness.   ____________________________________________   PHYSICAL EXAM:  VITAL SIGNS: ED Triage Vitals  Enc Vitals  Group     BP 03/12/19 1640 139/84     Pulse Rate 03/12/19 1638 (!) 102     Resp 03/12/19 1638 20     Temp 03/12/19 1642 97.8 F (36.6 C)     Temp Source 03/12/19 1642 Oral     SpO2 03/12/19 1638 94 %     Weight 03/12/19 1639 163 lb (73.9 kg)     Height 03/12/19 1639  (1.753 m)     Head Circumference --      Peak Flow --      Pain Score 03/12/19 1639 3     Pain Loc --      Pain Edu? --      Excl. in GC? --     Constitutional: Alert and oriented.  Patient is working to breathe, but speaking in 4 word  sentences. Eyes: Conjunctivae are normal Head: Atraumatic HEENT: No congestion/rhinnorhea. Mucous membranes are moist.  Oropharynx non-erythematous Neck:   Nontender with no meningismus, no masses, no stridor Cardiovascular: Normal rate, regular rhythm. Grossly normal heart sounds.  Good peripheral circulation. Respiratory: Excess respiratory effort.  No retractions.  Sounds wet diffuse Rales Abdominal: Soft and nontender. No distention. No guarding no rebound Back:  There is no focal tenderness or step off.  there is no midline tenderness there are no lesions noted. there is no CVA tenderness Musculoskeletal: No lower extremity tenderness, no upper extremity tenderness. No joint effusions, no DVT signs strong distal pulses no edema Neurologic:  Normal speech and language. No gross focal neurologic deficits are appreciated.  Skin:  Skin is warm, dry and intact. No rash noted. Psychiatric: Mood and affect are somewhat anxious. Speech and behavior are normal.  ____________________________________________   LABS (all labs ordered are listed, but only abnormal results are displayed)  Labs Reviewed  SARS CORONAVIRUS 2 (HOSPITAL ORDER, PERFORMED IN [facility])  CBC WITH DIFFERENTIAL/PLATELET  TROPONIN I  COMPREHENSIVE METABOLIC PANEL  BRAIN NATRIURETIC PEPTIDE  PROTIME-INR  APTT  ETHANOL    Pertinent labs  results that were available during my care of the patient were reviewed by me and considered in my medical decision making (see chart for details). ____________________________________________  EKG  I personally interpreted any EKGs ordered by me or triage Left bundle branch block rate 101, sinus tach, ____________________________________________  RADIOLOGY  Pertinent labs & imaging results that were available during my care of the patient were reviewed by me and considered in my medical decision making (see chart for details). If possible, patient and/or family  made aware of any abnormal findings.  No results found. ____________________________________________    PROCEDURES  Procedure(s) performed: None  Procedures  Critical Care performed: CRITICAL CARE Performed by: Jeanmarie Plant   Total critical care time: 55 minutes  Critical care time was exclusive of separately billable procedures and treating other patients.  Critical care was necessary to treat or prevent imminent or life-threatening deterioration.  Critical care was time spent personally by me on the following activities: development of treatment plan with patient and/or surrogate as well as nursing, discussions with consultants, evaluation of patient's response to treatment, examination of patient, obtaining history from patient or surrogate, ordering and performing treatments and interventions, ordering and review of laboratory studies, ordering and review of radiographic studies, pulse oximetry and re-evaluation of patient's condition.   ____________________________________________   INITIAL IMPRESSION / ASSESSMENT AND PLAN / ED COURSE  Pertinent labs & imaging results that were available during my care of the  patient were reviewed by me and considered in my medical decision making (see chart for details).  Patient here with chest pain shortness of breath and what it likely is pulmonary edema on clinical exam, feeling much better after EMS interventions.  Appreciate their hard work.  Patient is satting in the mid 90s on nonrebreather here.  I have again discussed with on-call cardiology.  They do not feel that he needs to go to the Cath Lab at this time after evaluating his EKG.  We did discuss his pain and his symptoms and history.  Dr. Kirke Corin asked me to defer heparin until we get the patient's enzymes given 3 days of symptoms.  We will do so.  Low suspicion for dissection given this history of intermittent pain worse with exertion, and it is certainly not possible infectious  etiology is present but I think that is not likely to be the first on the list.  In addition, patient does have no history of PE or DVT in the past he has diffuse altered lung sounds and chest pain, again more likely to be CHF.  However, we will continually reassess the need for further evaluation as patient progresses here.  We are putting him on BiPAP given his work of breathing which I think will help him.  Because of the pandemic we are sending COVID, though it is not first on my list of differentials.   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  I did discuss again with cardiology.  Again  can appreciate consult.  He does not want to heparinize the patient at this time he agrees with management and Lasix administration which we will give.  If patient's pressure starts to trend down that he is much more relaxed, is my hope that we can perhaps remove some of the nitroglycerin and get him off of the BiPAP.    ____________________________________________   FINAL CLINICAL IMPRESSION(S) / ED DIAGNOSES  Final diagnoses:  SOB (shortness of breath)      This chart was dictated using voice recognition software.  Despite best efforts to proofread,  errors can occur which can change meaning.      Jeanmarie Plant, MD 03/12/19 1654    Jeanmarie Plant, MD 03/12/19 1747    Jeanmarie Plant, MD 03/12/19 [phone]

## 2019-03-12 NOTE — H&P (Addendum)
[employer] [facility] [place] [facility]   PATIENT NAME: Kevin Case    MR#:  [id number]  DATE OF BIRTH:  1940-11-29  DATE OF ADMISSION:  03/12/2019  PRIMARY CARE PHYSICIAN: Ebbie Ridge, PA-C   REQUESTING/REFERRING PHYSICIAN: Ileana Roup, MD  CHIEF COMPLAINT:   Chief Complaint  Patient presents with  . Shortness of Breath    HISTORY OF PRESENT ILLNESS:  Kevin Case  is a 78 y.o. male with a known history of hypertension and diabetes who presented to the ED with gradually worsening shortness of breath over the last 3 days.  He states that his shortness of breath was so bad this morning that it "brought him to his knees".  He also endorses some worsening lower extremity edema over the last couple days.  He denies any orthopnea, cough, fevers, chills.  He endorses some "chest tightness", which has completely resolved.  In the ED, he had significantly increased work of breathing and was placed on nonrebreather mask and then BiPAP.  His work of breathing significantly improved, and he was weaned back to nasal cannula.  Labs are significant for creatinine 1.27, BNP 864, troponin 0.06. Chest x-ray with diffuse airspace opacities, likely diffuse pulmonary edema.  He was given Lasix IV x1 and hospitalists were called for admission.  PAST MEDICAL HISTORY:   Past Medical History:  Diagnosis Date  . Diabetes mellitus without complication (HCC)   . Hypertension   . Rib fracture     PAST SURGICAL HISTORY:  History reviewed. No pertinent surgical history.  SOCIAL HISTORY:   Social History   Tobacco Use  . Smoking status: Former Games developer  . Smokeless tobacco: Never Used  Substance Use Topics  . Alcohol use: No    Frequency: Never    FAMILY HISTORY:  Father-heart attack and pacemaker  DRUG ALLERGIES:  No Known Allergies  REVIEW OF SYSTEMS:   Review of Systems  Constitutional: Negative for chills and fever.  HENT: Negative for congestion and sore  throat.   Eyes: Negative for blurred vision and double vision.  Respiratory: Positive for shortness of breath. Negative for cough.   Cardiovascular: Positive for leg swelling. Negative for chest pain and palpitations.  Gastrointestinal: Negative for nausea and vomiting.  Genitourinary: Negative for dysuria and urgency.  Musculoskeletal: Negative for back pain and neck pain.  Neurological: Negative for dizziness and headaches.  Psychiatric/Behavioral: Negative for depression. The patient is not nervous/anxious.     MEDICATIONS AT HOME:   Prior to Admission medications   Medication Sig Start Date End Date Taking? Authorizing Provider  atorvastatin (LIPITOR) 80 MG tablet Take 1 tablet by mouth at bedtime. 01/05/19  Yes [provider]  furosemide (LASIX) 20 MG tablet Take 1 tablet by mouth daily. 12/14/18  Yes [provider]  JARDIANCE 10 MG TABS tablet Take 10 mg by mouth daily. 02/24/19  Yes [provider]  losartan (COZAAR) 25 MG tablet Take 25 mg by mouth daily. 10/27/18  Yes [provider]  omeprazole (PRILOSEC) 40 MG capsule Take 40 mg by mouth daily. 01/26/19  Yes [provider]  traMADol (ULTRAM) 50 MG tablet Take 1 tablet (50 mg total) by mouth every 6 (six) hours as needed. Patient not taking: Reported on 03/12/2019 10/13/17   Kem Boroughs B, FNP      VITAL SIGNS:  Blood pressure 110/73, pulse 88, temperature 97.8 F (36.6 C), temperature source Oral, resp. rate (!) 27, height 5\' 9"  (1.753 m), weight 73.9 kg, SpO2  96 %.  PHYSICAL EXAMINATION:  Physical Exam  GENERAL:  78 y.o.-year-old patient lying in the bed with no acute distress.  EYES: Pupils equal, round, reactive to light and accommodation. No scleral icterus. Extraocular muscles intact.  HEENT: Head atraumatic, normocephalic. Oropharynx and nasopharynx clear.  NECK:  Supple, no jugular venous distention. No thyroid enlargement, no tenderness.  LUNGS: + Bibasilar crackles  present.  BiPAP in place.  Able to speak in full sentences.  No use of accessory muscles of respiration.  CARDIOVASCULAR: RRR, S1, S2 normal. No murmurs, rubs, or gallops.  ABDOMEN: Soft, nontender, nondistended. Bowel sounds present. No organomegaly or mass.  EXTREMITIES: No cyanosis, or clubbing. + Trace peripheral edema bilaterally. NEUROLOGIC: Cranial nerves II through XII are intact. Muscle strength 5/5 in all extremities. Sensation intact. Gait not checked.  PSYCHIATRIC: The patient is alert and oriented x 3.  SKIN: No obvious rash, lesion, or ulcer.   LABORATORY PANEL:   CBC Recent Labs  Lab 03/12/19 1644  WBC 10.3  HGB 10.9*  HCT 36.0*  PLT 218   ------------------------------------------------------------------------------------------------------------------  Chemistries  Recent Labs  Lab 03/12/19 1644  NA 140  K 3.7  CL 110  CO2 19*  GLUCOSE 188*  BUN 26*  CREATININE 1.27*  CALCIUM 8.3*  AST 49*  ALT 34  ALKPHOS 67  BILITOT 0.9   ------------------------------------------------------------------------------------------------------------------  Cardiac Enzymes Recent Labs  Lab 03/12/19 1644  TROPONINI 0.06*   ------------------------------------------------------------------------------------------------------------------  RADIOLOGY:  Dg Chest Port 1 View  Result Date: 03/12/2019 CLINICAL DATA:  Acute chest pain and cough. EXAM: PORTABLE CHEST 1 VIEW COMPARISON:  08/07/2011 FINDINGS: Diffuse bilateral airspace opacities are noted. The cardiomediastinal silhouette is unremarkable. There may be trace bilateral pleural effusions present. No pneumothorax or acute bony abnormality. IMPRESSION: Diffuse bilateral airspace opacities which may represent diffuse edema versus infection. Electronically Signed   By: Harmon Pier M.D.   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  Chest x-ray with diffuse opacities, likely diffuse pulmonary edema.  Initially requiring BiPAP, but transitioned to nasal cannula.  Last ECHO 2017 with normal EF and grade 1 diastolic dysfunction. -COVID negative -Wean O2 as able -IV Lasix 40mg  daily -[facility] cardiology consulted -Check ECHO -Check procalcitonin -Daily weights, strict I/O  Elevated troponin- likely demand ischemia in the setting of CHF exacerbation and AKI.  No active chest pain. -Trend troponins -No heparin per cardiology recommendations, unless troponins trend up.  AKI- creatinine 1.27, baseline 0.8-1.0. -Will need to monitor kidney function closely with IV diuresis -Hold home losartan for now -Avoid nephrotoxic agents -Recheck kidney function in the morning  Hypertension-blood pressures normal in the ED -Holding home losartan -Hydralazine IV as needed  Type 2 diabetes- blood sugars elevated in the ED -SSI  Hyperlipidemia-stable -Continue home Lipitor  All the records are reviewed and case discussed with ED provider. Management plans discussed with the patient, family and they are in agreement.  CODE STATUS: DNI  TOTAL TIME TAKING CARE OF THIS PATIENT: 45 minutes.    Jinny Blossom Mayo M.D on 03/12/2019 at 5:51 PM  Between 7am to 6pm - Pager - [phone]  After 6pm go to [email or web address] - Social research officer, government  [facility]  Office  [phone]  CC: Primary care physician; Ebbie Ridge, PA-C   Note: This dictation was prepared with Dragon dictation along with smaller phrase technology. Any transcriptional errors that result from  this process are unintentional.

## 2019-03-12 NOTE — Progress Notes (Signed)
Family Meeting Note  Advance Directive:no  Today a meeting took place with the Patient.  Patient is able to participate.  The following clinical team members were present during this meeting:MD  The following were discussed:Patient's diagnosis: CHF exacerbation, Patient's progosis: Unable to determine and Goals for treatment: DNI   Patient states that he would never want to be on a ventilator machine, even for a short period of time.  He has been on BiPAP in the ED, and states he would be okay being on this in the future.  He is okay with cardiac resuscitation. Will make patient DNI.  Additional follow-up to be provided: prn  Time spent during discussion:20 minutes  Kevin Sinclair, MD

## 2019-03-12 NOTE — ED Notes (Signed)
Pts wife updated at this time.

## 2019-03-12 NOTE — Progress Notes (Signed)
ANTICOAGULATION CONSULT NOTE - Initial Consult  Pharmacy Consult for heparin drip Indication: chest pain/ACS  No Known Allergies  Patient Measurements: Height: 5\' 9"  (175.3 cm) Weight: 161 lb 8 oz (73.3 kg) IBW/kg (Calculated) : 70.7 Heparin Dosing Weight: 73 kg  Vital Signs: Temp: 98.9 F (37.2 C) (05/08 2019) Temp Source: Oral (05/08 2019) BP: 128/80 (05/08 2019) Pulse Rate: 82 (05/08 2019)  Labs: Recent Labs    03/12/19 1644 03/12/19 2034  HGB 10.9*  --   HCT 36.0*  --   PLT 218  --   APTT 27  --   LABPROT 14.4  --   INR 1.1  --   CREATININE 1.27*  --   TROPONINI 0.06* 1.60*    Estimated Creatinine Clearance: 50.3 mL/min (A) (by C-G formula based on SCr of 1.27 mg/dL (H)).   Medical History: Past Medical History:  Diagnosis Date  . Diabetes mellitus without complication (HCC)   . Hypertension   . Rib fracture     Medications:  Scheduled:  . atorvastatin  80 mg Oral QHS  . [START ON 03/13/2019] furosemide  40 mg Intravenous Daily  . insulin aspart  0-5 Units Subcutaneous QHS  . [START ON 03/13/2019] insulin aspart  0-9 Units Subcutaneous TID WC  . [START ON 03/13/2019] pantoprazole  80 mg Oral Daily    Assessment: Patient was admitted for SOB was found to have an initial trop of 0.06 then >> 1.6. EKG shows QRS widening and QT prolongation. Patient is being started on heparin for NSTEMI Patient is not on any PTA anticoagulation. Patient received one dose of lovenox 40 mg subq x 1  Goal of Therapy:  Heparin level 0.3-0.7 units/ml Monitor platelets by anticoagulation protocol: Yes   Plan:  Will omit bolus considering patient received lovenox x 1 @ 2059 Will start heparin drip at 1050 units/hr  Baseline labs WNL, will check anti-Xa @ 0600. Will monitor daily CBC's, adjust per anti-Xa levels and will monitor normalization of trops.  Thomasene Ripple, PharmD, BCPS Clinical Pharmacist 03/12/2019

## 2019-03-12 NOTE — ED Triage Notes (Signed)
Pt to ED via EMS from home with c/o chest pain sob, and cough for 2-3 days. Upon EMS arrival to pts home pt was in respiratory distress and in flash pulmonary edema after walking up set of stairs. Pt has hx LBBB, pt was given 1 inch nitro paste and fentanyl by ems. Pt placed on NRB 15L by ems not on o2 chronically, ems states sats were in the 70s on their arrival. Pt arrives at 92% NRB, MD McShane at bedside orders for biapap.

## 2019-03-13 ENCOUNTER — Inpatient Hospital Stay: Payer: Medicare Other

## 2019-03-13 ENCOUNTER — Encounter: Payer: Self-pay | Admitting: Cardiovascular Disease

## 2019-03-13 ENCOUNTER — Inpatient Hospital Stay (HOSPITAL_COMMUNITY)
Admit: 2019-03-13 | Discharge: 2019-03-13 | Disposition: A | Discharge status: Home / Self Care | Payer: Medicare Other | Attending: Internal Medicine | Admitting: Internal Medicine

## 2019-03-13 DIAGNOSIS — I447 Left bundle-branch block, unspecified: Secondary | ICD-10-CM

## 2019-03-13 DIAGNOSIS — R7989 Other specified abnormal findings of blood chemistry: Secondary | ICD-10-CM

## 2019-03-13 DIAGNOSIS — J9601 Acute respiratory failure with hypoxia: Secondary | ICD-10-CM

## 2019-03-13 DIAGNOSIS — I5031 Acute diastolic (congestive) heart failure: Secondary | ICD-10-CM

## 2019-03-13 LAB — COMPREHENSIVE METABOLIC PANEL
ALT: 30 U/L (ref 0–44)
AST: 50 U/L — ABNORMAL HIGH (ref 15–41)
Albumin: 2.8 g/dL — ABNORMAL LOW (ref 3.5–5.0)
Alkaline Phosphatase: 54 U/L (ref 38–126)
Anion gap: 6 (ref 5–15)
BUN: 26 mg/dL — ABNORMAL HIGH (ref 8–23)
CO2: 23 mmol/L (ref 22–32)
Calcium: 8.2 mg/dL — ABNORMAL LOW (ref 8.9–10.3)
Chloride: 111 mmol/L (ref 98–111)
Creatinine, Ser: 1.4 mg/dL — ABNORMAL HIGH (ref 0.61–1.24)
GFR calc Af Amer: 57 mL/min — ABNORMAL LOW (ref >60)
GFR calc non Af Amer: 49 mL/min — ABNORMAL LOW (ref >60)
Glucose, Bld: 126 mg/dL — ABNORMAL HIGH (ref 70–99)
Potassium: 3.8 mmol/L (ref 3.5–5.1)
Sodium: 140 mmol/L (ref 135–145)
Total Bilirubin: 0.7 mg/dL (ref 0.3–1.2)
Total Protein: 6.2 g/dL — ABNORMAL LOW (ref 6.5–8.1)

## 2019-03-13 LAB — CBC
HCT: 29.9 % — ABNORMAL LOW (ref 39.0–52.0)
Hemoglobin: 9.1 g/dL — ABNORMAL LOW (ref 13.0–17.0)
MCH: 25.1 pg — ABNORMAL LOW (ref 26.0–34.0)
MCHC: 30.4 g/dL (ref 30.0–36.0)
MCV: 82.6 fL (ref 80.0–100.0)
Platelets: 176 10*3/uL (ref 150–400)
RBC: 3.62 MIL/uL — ABNORMAL LOW (ref 4.22–5.81)
RDW: 15.4 % (ref 11.5–15.5)
WBC: 10.1 10*3/uL (ref 4.0–10.5)
nRBC: 0 % (ref 0.0–0.2)

## 2019-03-13 LAB — HEPARIN LEVEL (UNFRACTIONATED)
Heparin Unfractionated: 0.61 IU/mL (ref 0.30–0.70)
Heparin Unfractionated: 0.61 IU/mL (ref 0.30–0.70)

## 2019-03-13 LAB — GLUCOSE, CAPILLARY
Glucose-Capillary: 132 mg/dL — ABNORMAL HIGH (ref 70–99)
Glucose-Capillary: 195 mg/dL — ABNORMAL HIGH (ref 70–99)
Glucose-Capillary: 154 mg/dL — ABNORMAL HIGH (ref 70–99)
Glucose-Capillary: 113 mg/dL — ABNORMAL HIGH (ref 70–99)

## 2019-03-13 LAB — TROPONIN I
Troponin I: 3.53 ng/mL (ref <0.03)
Troponin I: 3.44 ng/mL (ref <0.03)

## 2019-03-13 MED ORDER — IPRATROPIUM-ALBUTEROL 0.5-2.5 (3) MG/3ML IN SOLN
3.0000 mL | Freq: Three times a day (TID) | RESPIRATORY_TRACT | Status: DC
  Administered 2019-03-13 – 2019-03-16 (×7): 3 mL via RESPIRATORY_TRACT
  Filled 2019-03-13 (×7): qty 3

## 2019-03-13 MED ORDER — SODIUM CHLORIDE 0.9 % IV BOLUS: 250.0000 mL | Freq: Once | INTRAVENOUS | Status: DC

## 2019-03-13 MED ORDER — ALBUTEROL SULFATE (2.5 MG/3ML) 0.083% IN NEBU
2.5000 mg | INHALATION_SOLUTION | RESPIRATORY_TRACT | Status: DC | PRN
  Administered 2019-03-14: 2.5 mg via RESPIRATORY_TRACT
  Filled 2019-03-13: qty 3

## 2019-03-13 NOTE — Progress Notes (Signed)
The patient presented with chest pain and heart failure symptoms . Initially, a code STEMI was called by EMS due to LBBB but this was determined to be old . Improved with dieresis . Troponin trended up to 3.5. Heparin drip was started . Echo is pending .  Will likely need a right and left heart cath on Monday .  Full consult to follow by our team.

## 2019-03-13 NOTE — Consult Note (Signed)
Cardiology Consultation:   Patient ID: Kevin Case MRN: [id number]; DOB: [birth year]  Admit date: 03/12/2019 Date of Consult: 03/13/2019  Primary Care Provider: Ebbie Ridge, PA-C Primary Cardiologist: [facility]  Primary Electrophysiologist:  None    Patient Profile:   Kevin Case is a 78 y.o. male with a hx of bradycardia, hypertension, left bundle branch block hyperlipidemia.  He is typically followed at [facility].  Who is being seen today for the evaluation of chest pain and CHF  at the request of  Dr. Amado Coe. Marland Kitchen  History of Present Illness:   Kevin Case is a 78 year old gentleman regularly followed by the cardiology department at [facility].  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  He also has a history of hypertension, hyperlipidemia.  The patient has had worsening shortness of breath over the past 2 weeks.  Acutely worse 2-3 day s ago .  Severe DOE with any activity .     Yesterday morning it was so bad that it brought him to his knees.  He has had some chest discomfort.   Does not get any regular exercise  No weight loss Has some leg swelling , wears compression hose   He denies any fevers or chills. He denies any nausea vomiting or diarrhea.  He presented to the emergency room and was found to have significant pulmonary edema.  He received IV Lasix and has improved. He is diuresed 580 cc so far this admission.  No fever, covid test negative on 03/12/19    Past Medical History:  Diagnosis Date  . Diabetes mellitus without complication (HCC)   . Hypertension   . Rib fracture     History reviewed. No pertinent surgical history.   Home Medications:  Prior to Admission medications   Medication Sig Start Date End Date Taking? Authorizing Provider  atorvastatin (LIPITOR) 80 MG tablet Take 1 tablet by mouth at bedtime. 01/05/19  Yes [provider]  furosemide (LASIX) 20 MG tablet Take 1 tablet by mouth  daily. 12/14/18  Yes [provider]  JARDIANCE 10 MG TABS tablet Take 10 mg by mouth daily. 02/24/19  Yes [provider]  losartan (COZAAR) 25 MG tablet Take 25 mg by mouth daily. 10/27/18  Yes [provider]  omeprazole (PRILOSEC) 40 MG capsule Take 40 mg by mouth daily. 01/26/19  Yes [provider]  traMADol (ULTRAM) 50 MG tablet Take 1 tablet (50 mg total) by mouth every 6 (six) hours as needed. Patient not taking: Reported on 03/12/2019 10/13/17   Chinita Pester, FNP    Inpatient Medications: Scheduled Meds: . atorvastatin  80 mg Oral QHS  . furosemide  40 mg Intravenous Daily  . insulin aspart  0-5 Units Subcutaneous QHS  . insulin aspart  0-9 Units Subcutaneous TID WC  . pantoprazole  80 mg Oral Daily   Continuous Infusions: . heparin 1,050 Units/hr (03/12/19 2359)   PRN Meds: acetaminophen **OR** acetaminophen, hydrALAZINE, ondansetron **OR** ondansetron (ZOFRAN) IV, polyethylene glycol  Allergies:   No Known Allergies  Social History:   Social History   Socioeconomic History  . Marital status: Married    Spouse name: Not on file  . Number of children: Not on file  . Years of education: Not on file  . Highest education level: Not on file  Occupational History  . Not on file  Social Needs  . Financial resource strain: Not on file  . Food insecurity:  Worry: Not on file    Inability: Not on file  . Transportation needs:    Medical: Not on file    Non-medical: Not on file  Tobacco Use  . Smoking status: Former Games developer  . Smokeless tobacco: Never Used  Substance and Sexual Activity  . Alcohol use: No    Frequency: Never  . Drug use: Not on file  . Sexual activity: Not on file  Lifestyle  . Physical activity:    Days per week: Not on file    Minutes per session: Not on file  . Stress: Not on file  Relationships  . Social connections:    Talks on phone: Not on file    Gets together: Not on file    Attends religious  service: Not on file    Active member of club or organization: Not on file    Attends meetings of clubs or organizations: Not on file    Relationship status: Not on file  . Intimate partner violence:    Fear of current or ex partner: Not on file    Emotionally abused: Not on file    Physically abused: Not on file    Forced sexual activity: Not on file  Other Topics Concern  . Not on file  Social History Narrative  . Not on file    Family History:    Family History  Problem Relation Age of Onset  . Congestive Heart Failure Father        had AICD placed.      ROS:  Please see the history of present illness.   All other ROS reviewed and negative.     Physical Exam/Data:   Vitals:   03/12/19 2000 03/12/19 2019 03/13/19 0522 03/13/19 0820  BP:  128/80 (!) 96/56 (!) 90/57  Pulse:  82 (!) 58 60  Resp:  20 16   Temp:  98.9 F (37.2 C) 98.3 F (36.8 C) 99.3 F (37.4 C)  TempSrc:  Oral Oral Oral  SpO2:  97% 98% 98%  Weight: 73.3 kg     Height:  (1.753 m)       Intake/Output Summary (Last 24 hours) at 03/13/2019 1147 Last data filed at 03/13/2019 0948 Gross per 24 hour  Intake 511.4 ml  Output 1100 ml  Net -588.6 ml   Last 3 Weights 03/12/2019 03/12/2019 10/13/2017  Weight (lbs) 161 lb 8 oz 163 lb 157 lb  Weight (kg) 73.256 kg 73.936 kg 71.215 kg     Body mass index is 23.85 kg/m.  General:   Elderly male,   NAD  HEENT: normal Lymph: no adenopathy Neck: no JVD Endocrine:  No thryomegaly Vascular: No carotid bruits; FA pulses 2+ bilaterally without bruits  Cardiac:  RR, soft systolic murmur  Lungs:  clear to auscultation bilaterally, no wheezing, rhonchi or rales  Abd: soft, nontender, no hepatomegaly  Ext: no edema Musculoskeletal:  No deformities, BUE and BLE strength normal and equal Skin: warm and dry  Neuro:  CNs 2-12 intact, no focal abnormalities noted Psych:  Normal affect   EKG:  The EKG was personally reviewed and demonstrates:   NSR , LBBB at 85.   Telemetry:  Telemetry was personally reviewed and demonstrates:  NSR with BBB   Relevant CV Studies:   Laboratory Data:  Chemistry Recent Labs  Lab 03/12/19 1644 03/13/19 0754  NA 140 140  K 3.7 3.8  CL 110 111  CO2 19* 23  GLUCOSE 188* 126*  BUN 26*  26*  CREATININE 1.27* 1.40*  CALCIUM 8.3* 8.2*  GFRNONAA 55* 49*  GFRAA >60 57*  ANIONGAP 11 6    Recent Labs  Lab 03/12/19 1644 03/13/19 0754  PROT 7.3 6.2*  ALBUMIN 3.5 2.8*  AST 49* 50*  ALT 34 30  ALKPHOS 67 54  BILITOT 0.9 0.7   Hematology Recent Labs  Lab 03/12/19 1644 03/13/19 0553  WBC 10.3 10.1  RBC 4.31 3.62*  HGB 10.9* 9.1*  HCT 36.0* 29.9*  MCV 83.5 82.6  MCH 25.3* 25.1*  MCHC 30.3 30.4  RDW 15.2 15.4  PLT 218 176   Cardiac Enzymes Recent Labs  Lab 03/12/19 1644 03/12/19 2034 03/13/19 0206 03/13/19 0754  TROPONINI 0.06* 1.60* 3.53* 3.44*   No results for input(s): TROPIPOC in the last 168 hours.  BNP Recent Labs  Lab 03/12/19 1644  BNP 864.0*    DDimer No results for input(s): DDIMER in the last 168 hours.  Radiology/Studies:  Dg Chest Port 1 View  Result Date: 03/12/2019 CLINICAL DATA:  Acute chest pain and cough. EXAM: PORTABLE CHEST 1 VIEW COMPARISON:  08/07/2011 FINDINGS: Diffuse bilateral airspace opacities are noted. The cardiomediastinal silhouette is unremarkable. There may be trace bilateral pleural effusions present. No pneumothorax or acute bony abnormality. IMPRESSION: Diffuse bilateral airspace opacities which may represent diffuse edema versus infection. Electronically Signed   By: Harmon Pier M.D.   On: 03/12/2019 17:11    Assessment and Plan:   1. 1.  Flash pulmonary edema: The patient has a history of left bundle branch block.  He is followed very closely at [facility].    His last echocardiogram from 2017 reveals an ejection fraction of 55 to 60%.  He has grade 1 diastolic dysfunction.  He has mild mitral regurgitation and mild aortic insufficiency.  He is  had progressive shortness of breath with exertion and now presents with flash pulmonary edema.  He is markedly better after receiving IV Lasix.  His troponin levels are elevated.  This  is consistent with coronary artery disease.  Continue current medications.  Anticipate heart catheterization on Monday.  2.  Possible coronary artery disease: The patient presents with flash pulmonary edema.  He had an elevated troponin level which might be due to demand ischemia but also may be due to severe coronary artery disease.  He has a old left bundle branch block.  His left ventricular systolic function has been normal at his last echocardiogram was in 2017 which showed normal left ventricular systolic function.  Repeat echocardiogram was performed this morning.  We discussed cardiac catheterization and possible PCI.  We discussed the risks, benefits, options.  He understands and agrees to proceed.      For questions or updates, please contact [facility] Please consult [email or web address] for contact info under     Signed, Kristeen Miss, MD  03/13/2019 11:47 AM

## 2019-03-13 NOTE — Progress Notes (Signed)
I was called by the nurse about low blood pressure of 80/40. I reviewed patient's chart and spoke to cardiologist-they suggested if patient is symptomatic then give 250 mL bolus.  I saw the patient, he did not had any complaints of shortness of breath or chest pain.  He was able to get up and go to the bathroom without any dizziness.  On auscultation his lungs sound clear without much crepitation.  He does not have much edema.  On recheck his blood pressure is more than 100 systolic as per the nurse. I would hold onto giving IV fluid bolus for now and check a chest x-ray to follow-up on pulmonary edema.  I have discontinued Lasix as patient has slight worsening on the renal function after 1600 mL of urine output and his lungs sound clear now.  Time spent in this management is 30 minutes.

## 2019-03-13 NOTE — Progress Notes (Addendum)
BP 82/54 (auto); 80/40 (manual).  No c/o dizziness or light-headedness.  Text sent to Dr. Amado Coe.  1833--No response.  On-call MD paged.  1843--DrKarrie Meres (on-call MD) paged again.  1850--Spoke with Dr. Karrie Meres regarding low BP.  He will confer with cardiology.  1855--Dr. Gouru responded to earlier page.  New orders:  Hold BP meds.  Updated Dr. Amado Coe regarding conversation with Dr. Karrie Meres.

## 2019-03-13 NOTE — Progress Notes (Signed)
[facility] - [place] at [facility]   PATIENT NAME: Kevin Case    MR#:  [id number]  DATE OF BIRTH:  04/20/1941  SUBJECTIVE:  CHIEF COMPLAINT: Patient is resting comfortably.  On heparin drip.  Denies any chest pain or shortness of breath  REVIEW OF SYSTEMS:  CONSTITUTIONAL: No fever, fatigue or weakness.  EYES: No blurred or double vision.  EARS, NOSE, AND THROAT: No tinnitus or ear pain.  RESPIRATORY: No cough, shortness of breath, wheezing or hemoptysis.  CARDIOVASCULAR: No chest pain, orthopnea, edema.  GASTROINTESTINAL: No nausea, vomiting, diarrhea or abdominal pain.  GENITOURINARY: No dysuria, hematuria.  ENDOCRINE: No polyuria, nocturia,  HEMATOLOGY: No anemia, easy bruising or bleeding SKIN: No rash or lesion. MUSCULOSKELETAL: No joint pain or arthritis.   NEUROLOGIC: No tingling, numbness, weakness.  PSYCHIATRY: No anxiety or depression.   DRUG ALLERGIES:  No Known Allergies  VITALS:  Blood pressure (!) 90/57, pulse 60, temperature 99.3 F (37.4 C), temperature source Oral, resp. rate 16, height 5\' 9"  (1.753 m), weight 73.3 kg, SpO2 98 %.  PHYSICAL EXAMINATION:  GENERAL:  78 y.o.-year-old patient lying in the bed with no acute distress.  EYES: Pupils equal, round, reactive to light and accommodation. No scleral icterus. Extraocular muscles intact.  HEENT: Head atraumatic, normocephalic. Oropharynx and nasopharynx clear.  NECK:  Supple, no jugular venous distention. No thyroid enlargement, no tenderness.  LUNGS: Normal breath sounds bilaterally, no wheezing, rales,rhonchi or crepitation. No use of accessory muscles of respiration.  CARDIOVASCULAR: S1, S2 normal. No murmurs, rubs, or gallops.  ABDOMEN: Soft, nontender, nondistended. Bowel sounds present. No organomegaly or mass.  EXTREMITIES: No pedal edema, cyanosis, or clubbing.  NEUROLOGIC: Awake alert and oriented x3 sensation intact. Gait not checked.  PSYCHIATRIC: The patient is  alert and oriented x 3.  SKIN: No obvious rash, lesion, or ulcer.    LABORATORY PANEL:   CBC Recent Labs  Lab 03/13/19 0553  WBC 10.1  HGB 9.1*  HCT 29.9*  PLT 176   ------------------------------------------------------------------------------------------------------------------  Chemistries  Recent Labs  Lab 03/13/19 0754  NA 140  K 3.8  CL 111  CO2 23  GLUCOSE 126*  BUN 26*  CREATININE 1.40*  CALCIUM 8.2*  AST 50*  ALT 30  ALKPHOS 54  BILITOT 0.7   ------------------------------------------------------------------------------------------------------------------  Cardiac Enzymes Recent Labs  Lab 03/13/19 0754  TROPONINI 3.44*   ------------------------------------------------------------------------------------------------------------------  RADIOLOGY:  Dg Chest Port 1 View  Result Date: 03/12/2019 CLINICAL DATA:  Acute chest pain and cough. EXAM: PORTABLE CHEST 1 VIEW COMPARISON:  08/07/2011 FINDINGS: Diffuse bilateral airspace opacities are noted. The cardiomediastinal silhouette is unremarkable. There may be trace bilateral pleural effusions present. No pneumothorax or acute bony abnormality. IMPRESSION: Diffuse bilateral airspace opacities which may represent diffuse edema versus infection. Electronically Signed   By: Harmon Pier M.D.   On: 03/12/2019 17:11    EKG:   Orders placed or performed during the hospital encounter of 03/12/19  . ED EKG  . ED EKG  . EKG 12-Lead  . EKG 12-Lead  . EKG 12-Lead  . 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   Chest x-ray with diffuse opacities, likely diffuse pulmonary edema.   Initially requiring BiPAP, but transitioned to nasal cannula.  - Last ECHO 2017 with normal EF and grade 1 diastolic dysfunction 55 to 60% EF. -COVID negative -Wean O2 to room air as able -IV Lasix  40mg  daily -[facility] cardiology is following  scheduled for cardiac cath on Monday -Check ECHO --Daily weights, strict I/O  Non-STEMI On heparin drip Scheduled to get cardiac catheterization on Monday -Repeat echocardiogram done today [facility] cardiology is following   AKI- baseline creatinine 0.8-1.0 Creatinine today is at 1.40 -Will need to monitor kidney function closely with IV diuresis -Hold home losartan for now -Avoid nephrotoxic agents -Recheck kidney function in the morning  Hypertension- -Currently patient is hypotensive -Holding home losartan -Hydralazine IV as needed  Type 2 diabetes- blood sugars elevated in the ED -SSI -Holding home medication in view of renal insufficiency -Diabetic coordinator consulted as patient is refusing insulin shots  Hyperlipidemia-stable -Continue home Lipitor    All the records are reviewed and case discussed with Care Management/Social Workerr. Management plans discussed with the patient at bedside and wife over phone and they are in agreement.  CODE STATUS: Partial code  TOTAL TIME TAKING CARE OF THIS PATIENT: 35 minutes.   POSSIBLE D/C IN 2-3 DAYS, DEPENDING ON CLINICAL CONDITION.  Note: This dictation was prepared with Dragon dictation along with smaller phrase technology. Any transcriptional errors that result from this process are unintentional.   Ramonita Lab M.D on 03/13/2019 at 2:11 PM  Between 7am to 6pm - Pager - [phone] After 6pm go to [email or web address] - password [email or web address] [facility]  [place] [facility]  Office  [phone]  CC: Primary care physician; Ebbie Ridge, PA-C

## 2019-03-13 NOTE — Plan of Care (Signed)
  Problem: Activity: Goal: Ability to tolerate increased activity will improve Outcome: Progressing   Problem: Cardiac: Goal: Ability to achieve and maintain adequate cardiovascular perfusion will improve Outcome: Progressing   

## 2019-03-13 NOTE — Progress Notes (Signed)
ANTICOAGULATION CONSULT NOTE - Initial Consult  Pharmacy Consult for heparin drip Indication: chest pain/ACS  No Known Allergies  Patient Measurements: Height: 5\' 9"  (175.3 cm) Weight: 161 lb 8 oz (73.3 kg) IBW/kg (Calculated) : 70.7 Heparin Dosing Weight: 73 kg  Vital Signs: Temp: 98.3 F (36.8 C) (05/09 0522) Temp Source: Oral (05/09 0522) BP: 96/56 (05/09 0522) Pulse Rate: 58 (05/09 0522)  Labs: Recent Labs    03/12/19 1644 03/12/19 2034 03/13/19 0206 03/13/19 0553  HGB 10.9*  --   --  9.1*  HCT 36.0*  --   --  29.9*  PLT 218  --   --  176  APTT 27  --   --   --   LABPROT 14.4  --   --   --   INR 1.1  --   --   --   HEPARINUNFRC  --   --   --  0.61  CREATININE 1.27*  --   --   --   TROPONINI 0.06* 1.60* 3.53*  --     Estimated Creatinine Clearance: 50.3 mL/min (A) (by C-G formula based on SCr of 1.27 mg/dL (H)).   Medical History: Past Medical History:  Diagnosis Date  . Diabetes mellitus without complication (HCC)   . Hypertension   . Rib fracture     Medications:  Scheduled:  . atorvastatin  80 mg Oral QHS  . furosemide  40 mg Intravenous Daily  . insulin aspart  0-5 Units Subcutaneous QHS  . insulin aspart  0-9 Units Subcutaneous TID WC  . pantoprazole  80 mg Oral Daily    Assessment: Patient was admitted for SOB was found to have an initial trop of 0.06 then >> 1.6. EKG shows QRS widening and QT prolongation. Patient is being started on heparin for NSTEMI Patient is not on any PTA anticoagulation. Patient received one dose of lovenox 40 mg subq x 1  Goal of Therapy:  Heparin level 0.3-0.7 units/ml Monitor platelets by anticoagulation protocol: Yes   Plan:  05/09 @ 0600 HL 0.61 therapeutic. Will continue current rate and will recheck HL @ 1400, CBC trended down, trops continue to trend up, will continue to monitor.  Thomasene Ripple, PharmD, BCPS Clinical Pharmacist 03/13/2019

## 2019-03-13 NOTE — Plan of Care (Signed)
  Problem: Activity: Goal: Ability to tolerate increased activity will improve Outcome: Progressing   Problem: Cardiac: Goal: Ability to achieve and maintain adequate cardiovascular perfusion will improve Outcome: Progressing   Problem: Clinical Measurements: Goal: Ability to maintain clinical measurements within normal limits will improve Outcome: Progressing Goal: Will remain free from infection Outcome: Progressing   Problem: Activity: Goal: Risk for activity intolerance will decrease Outcome: Progressing   Problem: Coping: Goal: Level of anxiety will decrease Outcome: Progressing   Problem: Pain Managment: Goal: General experience of comfort will improve Outcome: Progressing   Problem: Safety: Goal: Ability to remain free from injury will improve Outcome: Progressing

## 2019-03-13 NOTE — Plan of Care (Signed)

## 2019-03-13 NOTE — Progress Notes (Signed)
ANTICOAGULATION CONSULT NOTE   Pharmacy Consult for heparin drip Indication: chest pain/ACS  No Known Allergies  Patient Measurements: Height: 5\' 9"  (175.3 cm) Weight: 161 lb 8 oz (73.3 kg) IBW/kg (Calculated) : 70.7 Heparin Dosing Weight: 73 kg  Vital Signs: Temp: 99.3 F (37.4 C) (05/09 0820) Temp Source: Oral (05/09 0820) BP: 90/57 (05/09 0820) Pulse Rate: 60 (05/09 0820)  Labs: Recent Labs    03/12/19 1644 03/12/19 2034 03/13/19 0206 03/13/19 0553 03/13/19 0754 03/13/19 1413  HGB 10.9*  --   --  9.1*  --   --   HCT 36.0*  --   --  29.9*  --   --   PLT 218  --   --  176  --   --   APTT 27  --   --   --   --   --   LABPROT 14.4  --   --   --   --   --   INR 1.1  --   --   --   --   --   HEPARINUNFRC  --   --   --  0.61  --  0.61  CREATININE 1.27*  --   --   --  1.40*  --   TROPONINI 0.06* 1.60* 3.53*  --  3.44*  --     Estimated Creatinine Clearance: 45.6 mL/min (A) (by C-G formula based on SCr of 1.4 mg/dL (H)).   Medical History: Past Medical History:  Diagnosis Date  . Diabetes mellitus without complication (HCC)   . Hypertension   . Rib fracture     Medications:  Scheduled:  . atorvastatin  80 mg Oral QHS  . furosemide  40 mg Intravenous Daily  . insulin aspart  0-5 Units Subcutaneous QHS  . insulin aspart  0-9 Units Subcutaneous TID WC  . pantoprazole  80 mg Oral Daily    Assessment: Patient was admitted for SOB was found to have an initial trop of 0.06 then >> 1.6. EKG shows QRS widening and QT prolongation. Patient is being started on heparin for NSTEMI Patient is not on any PTA anticoagulation. Patient received one dose of lovenox 40 mg subq x 1  05/09 @ 0600 HL 0.61 therapeutic. Will continue current rate and will recheck HL @ 1400, CBC trended down, trops continue to trend up, will continue to monitor.  Goal of Therapy:  Heparin level 0.3-0.7 units/ml Monitor platelets by anticoagulation protocol: Yes   Plan:  5/9 HL @ 1413= 0.61.  Will continue current Heparin drip rate of 1050 units/hr and f/u HL with am labs.   Bari Mantis PharmD Clinical Pharmacist 03/13/2019

## 2019-03-14 DIAGNOSIS — I5043 Acute on chronic combined systolic (congestive) and diastolic (congestive) heart failure: Secondary | ICD-10-CM

## 2019-03-14 LAB — CBC
HCT: 29.2 % — ABNORMAL LOW (ref 39.0–52.0)
Hemoglobin: 8.8 g/dL — ABNORMAL LOW (ref 13.0–17.0)
MCH: 25.1 pg — ABNORMAL LOW (ref 26.0–34.0)
MCHC: 30.1 g/dL (ref 30.0–36.0)
MCV: 83.2 fL (ref 80.0–100.0)
Platelets: 179 10*3/uL (ref 150–400)
RBC: 3.51 MIL/uL — ABNORMAL LOW (ref 4.22–5.81)
RDW: 15.8 % — ABNORMAL HIGH (ref 11.5–15.5)
WBC: 8.3 10*3/uL (ref 4.0–10.5)
nRBC: 0 % (ref 0.0–0.2)

## 2019-03-14 LAB — HEPARIN LEVEL (UNFRACTIONATED): Heparin Unfractionated: 0.54 IU/mL (ref 0.30–0.70)

## 2019-03-14 LAB — ECHOCARDIOGRAM COMPLETE
Height: 69 in
Weight: 2584 oz

## 2019-03-14 LAB — GLUCOSE, CAPILLARY
Glucose-Capillary: 134 mg/dL — ABNORMAL HIGH (ref 70–99)
Glucose-Capillary: 215 mg/dL — ABNORMAL HIGH (ref 70–99)
Glucose-Capillary: 124 mg/dL — ABNORMAL HIGH (ref 70–99)
Glucose-Capillary: 114 mg/dL — ABNORMAL HIGH (ref 70–99)

## 2019-03-14 MED ORDER — SODIUM CHLORIDE 0.9 % IV SOLN
INTRAVENOUS | Status: DC
  Administered 2019-03-14 – 2019-03-16 (×2): via INTRAVENOUS

## 2019-03-14 NOTE — Progress Notes (Addendum)
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 Echo yesterday showed EF 30-35%. He has moderate AS  He was aggressively diuresed yesterday and had transient hypotension  Received some IV fluids and BP has stabilized.   Feeling better   Inpatient Medications    Scheduled Meds: . atorvastatin  80 mg Oral QHS  . insulin aspart  0-5 Units Subcutaneous QHS  . insulin aspart  0-9 Units Subcutaneous TID WC  . ipratropium-albuterol  3 mL Nebulization TID  . pantoprazole  80 mg Oral Daily   Continuous Infusions: . heparin 1,050 Units/hr (03/13/19 2155)  . sodium chloride Stopped (03/13/19 2043)   PRN Meds: acetaminophen **OR** acetaminophen, albuterol, hydrALAZINE, ondansetron **OR** ondansetron (ZOFRAN) IV, polyethylene glycol   Vital Signs    Vitals:   03/14/19 0459 03/14/19 0537 03/14/19 0750 03/14/19 0905  BP: 94/62  (!) 91/49   Pulse: 72  (!) 51 (!) 55  Resp: 16   16  Temp: 98.1 F (36.7 C)  98.2 F (36.8 C)   TempSrc: Oral  Oral   SpO2: 100% 99% 98% 100%  Weight: 71.4 kg     Height:        Intake/Output Summary (Last 24 hours) at 03/14/2019 1022 Last data filed at 03/14/2019 0959 Gross per 24 hour  Intake 771.14 ml  Output 1645 ml  Net -873.86 ml   Last 3 Weights 03/14/2019 03/12/2019 03/12/2019  Weight (lbs) 157 lb 4.8 oz 161 lb 8 oz 163 lb  Weight (kg) 71.351 kg 73.256 kg 73.936 kg      Telemetry     NSR , LBBB  - Personally Reviewed  ECG     - Personally Reviewed  Physical Exam   GEN:  elderly man,  NAD  Neck: No JVD Cardiac: RRR, 2-3/6 systolic murmur .  Respiratory: Clear to auscultation bilaterally. GI: Soft, nontender, non-distended  MS: No edema; No deformity. Neuro:  Nonfocal  Psych: Normal affect   Labs    Chemistry Recent Labs  Lab 03/12/19 1644 03/13/19 0754  NA  140 140  K 3.7 3.8  CL 110 111  CO2 19* 23  GLUCOSE 188* 126*  BUN 26* 26*  CREATININE 1.27* 1.40*  CALCIUM 8.3* 8.2*  PROT 7.3 6.2*  ALBUMIN 3.5 2.8*  AST 49* 50*  ALT 34 30  ALKPHOS 67 54  BILITOT 0.9 0.7  GFRNONAA 55* 49*  GFRAA >60 57*  ANIONGAP 11 6     Hematology Recent Labs  Lab 03/12/19 1644 03/13/19 0553 03/14/19 0419  WBC 10.3 10.1 8.3  RBC 4.31 3.62* 3.51*  HGB 10.9* 9.1* 8.8*  HCT 36.0* 29.9* 29.2*  MCV 83.5 82.6 83.2  MCH 25.3* 25.1* 25.1*  MCHC 30.3 30.4 30.1  RDW 15.2 15.4 15.8*  PLT 218 176 179    Cardiac Enzymes Recent Labs  Lab 03/12/19 1644 03/12/19 2034 03/13/19 0206 03/13/19 0754  TROPONINI 0.06* 1.60* 3.53* 3.44*   No results for input(s): TROPIPOC in the last 168 hours.   BNP Recent Labs  Lab 03/12/19 1644  BNP 864.0*     DDimer No results for input(s): DDIMER in the last 168 hours.   Radiology    Dg Chest 1 View  Result Date: 03/13/2019 CLINICAL DATA:  Pulmonary edema EXAM: CHEST  1 VIEW COMPARISON:  03/12/2019 FINDINGS: Cardiac shadow is  stable. Previously seen vascular congestion and pulmonary edema has improved significantly with mild residual remaining. No focal infiltrate is seen. No bony abnormality is noted. IMPRESSION: Significant improvement in pulmonary edema when compared with the previous day. Electronically Signed   By: Alcide Clever M.D.   On: 03/13/2019 21:32   Dg Chest Port 1 View  Result Date: 03/12/2019 CLINICAL DATA:  Acute chest pain and cough. EXAM: PORTABLE CHEST 1 VIEW COMPARISON:  08/07/2011 FINDINGS: Diffuse bilateral airspace opacities are noted. The cardiomediastinal silhouette is unremarkable. There may be trace bilateral pleural effusions present. No pneumothorax or acute bony abnormality. IMPRESSION: Diffuse bilateral airspace opacities which may represent diffuse edema versus infection. Electronically Signed   By: Harmon Pier M.D.   On: 03/12/2019 17:11    Cardiac Studies      Patient Profile      78 y.o. male with long hx of LBBB Now admitted with CP and severe DOE   Assessment & Plan     1.   Acute on chronic combined CHF:   Echo shows an EF of 30-35%. He will need a cardiac cath  Also has at least moderate AS  Continue medical therapy for now We have discussed risks, benefits, options of cath .  He understands and agrees to proceed.  May eventually need a BI-V pacer ICD .   2.  Aortic stenosis.   Has thickened AV.   Will assess further at cath   3.  LBBB:         For questions or updates, please contact [facility] Please consult [email or web address] for contact info under        Signed, Kristeen Miss, MD  03/14/2019, 10:22 AM

## 2019-03-14 NOTE — Progress Notes (Signed)
[facility] - [place] at [facility]   PATIENT NAME: Kevin Case    MR#:  [id number]  DATE OF BIRTH:  [birth year]  SUBJECTIVE:  CHIEF COMPLAINT: Patient is resting comfortably.  On heparin drip.  Denies any chest pain or shortness of breath.  Denies any dizzy spells.  Reports his baseline systolic blood pressure 115-120  REVIEW OF SYSTEMS:  CONSTITUTIONAL: No fever, fatigue or weakness.  EYES: No blurred or double vision.  EARS, NOSE, AND THROAT: No tinnitus or ear pain.  RESPIRATORY: No cough, shortness of breath, wheezing or hemoptysis.  CARDIOVASCULAR: No chest pain, orthopnea, edema.  GASTROINTESTINAL: No nausea, vomiting, diarrhea or abdominal pain.  GENITOURINARY: No dysuria, hematuria.  ENDOCRINE: No polyuria, nocturia,  HEMATOLOGY: No anemia, easy bruising or bleeding SKIN: No rash or lesion. MUSCULOSKELETAL: No joint pain or arthritis.   NEUROLOGIC: No tingling, numbness, weakness.  PSYCHIATRY: No anxiety or depression.   DRUG ALLERGIES:  No Known Allergies  VITALS:  Blood pressure (!) 91/49, pulse (!) 55, temperature 98.2 F (36.8 C), temperature source Oral, resp. rate 16, height 5\' 9"  (1.753 m), weight 71.4 kg, SpO2 100 %.  PHYSICAL EXAMINATION:  GENERAL:  78 y.o.-year-old patient lying in the bed with no acute distress.  EYES: Pupils equal, round, reactive to light and accommodation. No scleral icterus. Extraocular muscles intact.  HEENT: Head atraumatic, normocephalic. Oropharynx and nasopharynx clear.  NECK:  Supple, no jugular venous distention. No thyroid enlargement, no tenderness.  LUNGS: Normal breath sounds bilaterally, no wheezing, rales,rhonchi or crepitation. No use of accessory muscles of respiration.  CARDIOVASCULAR: S1, S2 normal. No murmurs, rubs, or gallops.  ABDOMEN: Soft, nontender, nondistended. Bowel sounds present. EXTREMITIES: No pedal edema, cyanosis, or clubbing.  NEUROLOGIC: Awake alert and oriented x3 sensation  intact. Gait not checked.  PSYCHIATRIC: The patient is alert and oriented x 3.  SKIN: No obvious rash, lesion, or ulcer.    LABORATORY PANEL:   CBC Recent Labs  Lab 03/14/19 0419  WBC 8.3  HGB 8.8*  HCT 29.2*  PLT 179   ------------------------------------------------------------------------------------------------------------------  Chemistries  Recent Labs  Lab 03/13/19 0754  NA 140  K 3.8  CL 111  CO2 23  GLUCOSE 126*  BUN 26*  CREATININE 1.40*  CALCIUM 8.2*  AST 50*  ALT 30  ALKPHOS 54  BILITOT 0.7   ------------------------------------------------------------------------------------------------------------------  Cardiac Enzymes Recent Labs  Lab 03/13/19 0754  TROPONINI 3.44*   ------------------------------------------------------------------------------------------------------------------  RADIOLOGY:  Dg Chest 1 View  Result Date: 03/13/2019 CLINICAL DATA:  Pulmonary edema EXAM: CHEST  1 VIEW COMPARISON:  03/12/2019 FINDINGS: Cardiac shadow is stable. Previously seen vascular congestion and pulmonary edema has improved significantly with mild residual remaining. No focal infiltrate is seen. No bony abnormality is noted. IMPRESSION: Significant improvement in pulmonary edema when compared with the previous day. Electronically Signed   By: Alcide Clever M.D.   On: 03/13/2019 21:32   Dg Chest Port 1 View  Result Date: 03/12/2019 CLINICAL DATA:  Acute chest pain and cough. EXAM: PORTABLE CHEST 1 VIEW COMPARISON:  08/07/2011 FINDINGS: Diffuse bilateral airspace opacities are noted. The cardiomediastinal silhouette is unremarkable. There may be trace bilateral pleural effusions present. No pneumothorax or acute bony abnormality. IMPRESSION: Diffuse bilateral airspace opacities which may represent diffuse edema versus infection. Electronically Signed   By: Harmon Pier M.D.   On: 03/12/2019 17:11    EKG:   Orders placed or performed during the hospital encounter  of 03/12/19  . ED EKG  .  ED EKG  . EKG 12-Lead  . EKG 12-Lead  . EKG 12-Lead  . EKG 12-Lead    ASSESSMENT AND PLAN:    Acute hypoxic respiratory failure-likely secondary to acute on chronic systolic CHF exacerbation.   Chest x-ray with diffuse opacities, likely diffuse pulmonary edema.   Initially requiring BiPAP, but transitioned to nasal cannula.  -COVID negative -Wean O2 to room air as able -Lasix discontinued in view of hypotension -[facility] cardiology is following scheduled for cardiac cath on Monday -Current echo cardiogram has revealed 30 to 35% of ejection fraction  which has gotten worse from /2017-previously it was at 55 to 60% EF --Daily weights, strict I/O  Non-STEMI On heparin drip Scheduled to get cardiac catheterization on Monday.  N.p.o. after midnight -Repeat echocardiogram done today [facility] cardiology is following   AKI- baseline creatinine 0.8-1.0 Provide gentle hydration with IV fluids with close monitoring for symptoms and signs of fluid overload as his ejection fraction is 30 to 35% Creatinine is at 1.40 -Will need to monitor kidney function closely with IV diuresis -Hold home losartan and Lasix -Avoid nephrotoxic agents -Recheck kidney function in the morning  Hypertension- -Currently patient is hypotensive -Holding home losartan -Hydralazine IV as needed for high blood sugar  Type 2 diabetes- blood sugars elevated in the ED -SSI -Holding home medication in view of renal insufficiency -Diabetic coordinator consulted as patient is refusing insulin shots  Hyperlipidemia-stable -Continue home Lipitor    All the records are reviewed and case discussed with Care Management/Social Workerr. Management plans discussed with the patient at bedside and wife over phone and they are in agreement.  CODE STATUS: Partial code  TOTAL TIME TAKING CARE OF THIS PATIENT: 35 minutes.   POSSIBLE D/C IN 2-3 DAYS, DEPENDING ON CLINICAL CONDITION.  Note: This  dictation was prepared with Dragon dictation along with smaller phrase technology. Any transcriptional errors that result from this process are unintentional.   Ramonita Lab M.D on 03/14/2019 at 12:17 PM  Between 7am to 6pm - Pager - [phone] After 6pm go to [email or web address] - password [email or web address] [facility]  [place]  [facility]  Office  [phone]  CC: Primary care physician; Ebbie Ridge, PA-C

## 2019-03-14 NOTE — H&P (View-Only) (Signed)
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 Echo yesterday showed EF 30-35%. He has moderate AS  He was aggressively diuresed yesterday and had transient hypotension  Received some IV fluids and BP has stabilized.   Feeling better   Inpatient Medications    Scheduled Meds: . atorvastatin  80 mg Oral QHS  . insulin aspart  0-5 Units Subcutaneous QHS  . insulin aspart  0-9 Units Subcutaneous TID WC  . ipratropium-albuterol  3 mL Nebulization TID  . pantoprazole  80 mg Oral Daily   Continuous Infusions: . heparin 1,050 Units/hr (03/13/19 2155)  . sodium chloride Stopped (03/13/19 2043)   PRN Meds: acetaminophen **OR** acetaminophen, albuterol, hydrALAZINE, ondansetron **OR** ondansetron (ZOFRAN) IV, polyethylene glycol   Vital Signs    Vitals:   03/14/19 0459 03/14/19 0537 03/14/19 0750 03/14/19 0905  BP: 94/62  (!) 91/49   Pulse: 72  (!) 51 (!) 55  Resp: 16   16  Temp: 98.1 F (36.7 C)  98.2 F (36.8 C)   TempSrc: Oral  Oral   SpO2: 100% 99% 98% 100%  Weight: 71.4 kg     Height:        Intake/Output Summary (Last 24 hours) at 03/14/2019 1022 Last data filed at 03/14/2019 0959 Gross per 24 hour  Intake 771.14 ml  Output 1645 ml  Net -873.86 ml   Last 3 Weights 03/14/2019 03/12/2019 03/12/2019  Weight (lbs) 157 lb 4.8 oz 161 lb 8 oz 163 lb  Weight (kg) 71.351 kg 73.256 kg 73.936 kg      Telemetry     NSR , LBBB  - Personally Reviewed  ECG     - Personally Reviewed  Physical Exam   GEN:  elderly man,  NAD  Neck: No JVD Cardiac: RRR, 2-3/6 systolic murmur .  Respiratory: Clear to auscultation bilaterally. GI: Soft, nontender, non-distended  MS: No edema; No deformity. Neuro:  Nonfocal  Psych: Normal affect   Labs    Chemistry Recent Labs  Lab 03/12/19 1644 03/13/19 0754  NA  140 140  K 3.7 3.8  CL 110 111  CO2 19* 23  GLUCOSE 188* 126*  BUN 26* 26*  CREATININE 1.27* 1.40*  CALCIUM 8.3* 8.2*  PROT 7.3 6.2*  ALBUMIN 3.5 2.8*  AST 49* 50*  ALT 34 30  ALKPHOS 67 54  BILITOT 0.9 0.7  GFRNONAA 55* 49*  GFRAA >60 57*  ANIONGAP 11 6     Hematology Recent Labs  Lab 03/12/19 1644 03/13/19 0553 03/14/19 0419  WBC 10.3 10.1 8.3  RBC 4.31 3.62* 3.51*  HGB 10.9* 9.1* 8.8*  HCT 36.0* 29.9* 29.2*  MCV 83.5 82.6 83.2  MCH 25.3* 25.1* 25.1*  MCHC 30.3 30.4 30.1  RDW 15.2 15.4 15.8*  PLT 218 176 179    Cardiac Enzymes Recent Labs  Lab 03/12/19 1644 03/12/19 2034 03/13/19 0206 03/13/19 0754  TROPONINI 0.06* 1.60* 3.53* 3.44*   No results for input(s): TROPIPOC in the last 168 hours.   BNP Recent Labs  Lab 03/12/19 1644  BNP 864.0*     DDimer No results for input(s): DDIMER in the last 168 hours.   Radiology    Dg Chest 1 View  Result Date: 03/13/2019 CLINICAL DATA:  Pulmonary edema EXAM: CHEST  1 VIEW COMPARISON:  03/12/2019 FINDINGS: Cardiac shadow is  stable. Previously seen vascular congestion and pulmonary edema has improved significantly with mild residual remaining. No focal infiltrate is seen. No bony abnormality is noted. IMPRESSION: Significant improvement in pulmonary edema when compared with the previous day. Electronically Signed   By: Mark  Lukens M.D.   On: 03/13/2019 21:32   Dg Chest Port 1 View  Result Date: 03/12/2019 CLINICAL DATA:  Acute chest pain and cough. EXAM: PORTABLE CHEST 1 VIEW COMPARISON:  08/07/2011 FINDINGS: Diffuse bilateral airspace opacities are noted. The cardiomediastinal silhouette is unremarkable. There may be trace bilateral pleural effusions present. No pneumothorax or acute bony abnormality. IMPRESSION: Diffuse bilateral airspace opacities which may represent diffuse edema versus infection. Electronically Signed   By: Jeffrey  Hu M.D.   On: 03/12/2019 17:11    Cardiac Studies      Patient Profile      77 y.o. male with long hx of LBBB Now admitted with CP and severe DOE   Assessment & Plan     1.   Acute on chronic combined CHF:   Echo shows an EF of 30-35%. He will need a cardiac cath  Also has at least moderate AS  Continue medical therapy for now We have discussed risks, benefits, options of cath .  He understands and agrees to proceed.  May eventually need a BI-V pacer ICD .   2.  Aortic stenosis.   Has thickened AV.   Will assess further at cath   3.  LBBB:         For questions or updates, please contact [facility] Please consult [email or web address] for contact info under        Signed, Mckyle Solanki, MD  03/14/2019, 10:22 AM    

## 2019-03-14 NOTE — Plan of Care (Signed)

## 2019-03-15 ENCOUNTER — Encounter
Admission: EM | Disposition: A | Discharge status: Home / Self Care | Payer: Self-pay | Source: Home / Self Care | Attending: Internal Medicine

## 2019-03-15 ENCOUNTER — Encounter: Payer: Self-pay | Admitting: Cardiovascular Disease

## 2019-03-15 DIAGNOSIS — I2510 Atherosclerotic heart disease of native coronary artery without angina pectoris: Secondary | ICD-10-CM

## 2019-03-15 DIAGNOSIS — I214 Non-ST elevation (NSTEMI) myocardial infarction: Secondary | ICD-10-CM

## 2019-03-15 DIAGNOSIS — I5021 Acute systolic (congestive) heart failure: Secondary | ICD-10-CM

## 2019-03-15 HISTORY — PX: RIGHT/LEFT HEART CATH AND CORONARY ANGIOGRAPHY: CATH118266

## 2019-03-15 LAB — HEPARIN LEVEL (UNFRACTIONATED): Heparin Unfractionated: 0.5 IU/mL (ref 0.30–0.70)

## 2019-03-15 LAB — BASIC METABOLIC PANEL
Anion gap: 6 (ref 5–15)
BUN: 27 mg/dL — ABNORMAL HIGH (ref 8–23)
CO2: 23 mmol/L (ref 22–32)
Calcium: 8.2 mg/dL — ABNORMAL LOW (ref 8.9–10.3)
Chloride: 114 mmol/L — ABNORMAL HIGH (ref 98–111)
Creatinine, Ser: 1.35 mg/dL — ABNORMAL HIGH (ref 0.61–1.24)
GFR calc Af Amer: 59 mL/min — ABNORMAL LOW (ref >60)
GFR calc non Af Amer: 51 mL/min — ABNORMAL LOW (ref >60)
Glucose, Bld: 150 mg/dL — ABNORMAL HIGH (ref 70–99)
Potassium: 3.9 mmol/L (ref 3.5–5.1)
Sodium: 143 mmol/L (ref 135–145)

## 2019-03-15 LAB — GLUCOSE, CAPILLARY
Glucose-Capillary: 103 mg/dL — ABNORMAL HIGH (ref 70–99)
Glucose-Capillary: 116 mg/dL — ABNORMAL HIGH (ref 70–99)
Glucose-Capillary: 146 mg/dL — ABNORMAL HIGH (ref 70–99)
Glucose-Capillary: 124 mg/dL — ABNORMAL HIGH (ref 70–99)

## 2019-03-15 LAB — CBC
HCT: 28.4 % — ABNORMAL LOW (ref 39.0–52.0)
Hemoglobin: 8.6 g/dL — ABNORMAL LOW (ref 13.0–17.0)
MCH: 24.9 pg — ABNORMAL LOW (ref 26.0–34.0)
MCHC: 30.3 g/dL (ref 30.0–36.0)
MCV: 82.1 fL (ref 80.0–100.0)
Platelets: 164 10*3/uL (ref 150–400)
RBC: 3.46 MIL/uL — ABNORMAL LOW (ref 4.22–5.81)
RDW: 15.7 % — ABNORMAL HIGH (ref 11.5–15.5)
WBC: 7.4 10*3/uL (ref 4.0–10.5)
nRBC: 0 % (ref 0.0–0.2)

## 2019-03-15 SURGERY — RIGHT/LEFT HEART CATH AND CORONARY ANGIOGRAPHY
Anesthesia: Moderate Sedation

## 2019-03-15 MED ORDER — SODIUM CHLORIDE 0.9% FLUSH: 3.0000 mL | INTRAVENOUS | Status: DC | PRN

## 2019-03-15 MED ORDER — SODIUM CHLORIDE 0.9 % IV SOLN
INTRAVENOUS | Status: DC
  Administered 2019-03-15: 1000 mL via INTRAVENOUS

## 2019-03-15 MED ORDER — LOSARTAN POTASSIUM 25 MG PO TABS
25.0000 mg | ORAL_TABLET | Freq: Every day | ORAL | Status: DC
  Administered 2019-03-15 – 2019-03-16 (×2): 25 mg via ORAL
  Filled 2019-03-15 (×3): qty 1

## 2019-03-15 MED ORDER — MIDAZOLAM HCL 2 MG/2ML IJ SOLN
INTRAMUSCULAR | Status: DC | PRN
  Administered 2019-03-15: 1 mg via INTRAVENOUS

## 2019-03-15 MED ORDER — MIDAZOLAM HCL 2 MG/2ML IJ SOLN
INTRAMUSCULAR | Status: AC
  Filled 2019-03-15: qty 2

## 2019-03-15 MED ORDER — HEPARIN (PORCINE) IN NACL 1000-0.9 UT/500ML-% IV SOLN
INTRAVENOUS | Status: AC
  Filled 2019-03-15: qty 1000

## 2019-03-15 MED ORDER — ASPIRIN 81 MG PO CHEW
CHEWABLE_TABLET | ORAL | Status: AC
  Filled 2019-03-15: qty 1

## 2019-03-15 MED ORDER — SODIUM CHLORIDE 0.9 % IV SOLN
INTRAVENOUS | Status: AC
  Administered 2019-03-15: 13:00:00 via INTRAVENOUS

## 2019-03-15 MED ORDER — FENTANYL CITRATE (PF) 100 MCG/2ML IJ SOLN
INTRAMUSCULAR | Status: DC | PRN
  Administered 2019-03-15: 25 ug via INTRAVENOUS

## 2019-03-15 MED ORDER — SODIUM CHLORIDE 0.9% FLUSH
3.0000 mL | Freq: Two times a day (BID) | INTRAVENOUS | Status: DC
  Administered 2019-03-16: 3 mL via INTRAVENOUS

## 2019-03-15 MED ORDER — SODIUM CHLORIDE 0.9 % IV SOLN: 250.0000 mL | INTRAVENOUS | Status: DC | PRN

## 2019-03-15 MED ORDER — LOSARTAN POTASSIUM 25 MG PO TABS: 25.0000 mg | ORAL_TABLET | Freq: Every day | ORAL | Status: DC

## 2019-03-15 MED ORDER — SODIUM CHLORIDE 0.9 % IV SOLN
INTRAVENOUS | Status: DC | PRN
  Administered 2019-03-15: 250 mL via INTRAVENOUS

## 2019-03-15 MED ORDER — SODIUM CHLORIDE 0.9% FLUSH
3.0000 mL | INTRAVENOUS | Status: DC | PRN
  Administered 2019-03-15: 3 mL via INTRAVENOUS
  Filled 2019-03-15: qty 3

## 2019-03-15 MED ORDER — SODIUM CHLORIDE 0.9% FLUSH: 3.0000 mL | Freq: Two times a day (BID) | INTRAVENOUS | Status: DC

## 2019-03-15 MED ORDER — ASPIRIN 81 MG PO CHEW: 81.0000 mg | CHEWABLE_TABLET | Freq: Every day | ORAL | Status: DC

## 2019-03-15 MED ORDER — FUROSEMIDE 20 MG PO TABS
20.0000 mg | ORAL_TABLET | Freq: Every day | ORAL | Status: DC
  Administered 2019-03-15 – 2019-03-16 (×2): 20 mg via ORAL
  Filled 2019-03-15 (×2): qty 1

## 2019-03-15 MED ORDER — IOHEXOL 300 MG/ML  SOLN
INTRAMUSCULAR | Status: DC | PRN
  Administered 2019-03-15: 70 mL via INTRA_ARTERIAL

## 2019-03-15 MED ORDER — ASPIRIN 81 MG PO CHEW
81.0000 mg | CHEWABLE_TABLET | ORAL | Status: AC
  Administered 2019-03-15: 81 mg via ORAL

## 2019-03-15 MED ORDER — FENTANYL CITRATE (PF) 100 MCG/2ML IJ SOLN
INTRAMUSCULAR | Status: AC
  Filled 2019-03-15: qty 2

## 2019-03-15 SURGICAL SUPPLY — 15 items
CANNULA 5F STIFF (CANNULA) ×3 IMPLANT
CATH INFINITI 5FR ANG PIGTAIL (CATHETERS) ×3 IMPLANT
CATH INFINITI 5FR JL4 (CATHETERS) ×3 IMPLANT
CATH INFINITI JR4 5F (CATHETERS) ×3 IMPLANT
CATH SWANZ 7F THERMO (CATHETERS) ×3 IMPLANT
GLIDESHEATH SLEND SS 6F .021 (SHEATH) IMPLANT
KIT MANI 3VAL PERCEP (MISCELLANEOUS) ×3 IMPLANT
KIT RIGHT HEART (MISCELLANEOUS) ×3 IMPLANT
NEEDLE PERC 18GX7CM (NEEDLE) ×3 IMPLANT
PACK CARDIAC CATH (CUSTOM PROCEDURE TRAY) ×3 IMPLANT
SHEATH AVANTI 5FR X 11CM (SHEATH) ×3 IMPLANT
SHEATH AVANTI 7FRX11 (SHEATH) ×3 IMPLANT
SHEATH GLIDE SLENDER 4/5FR (SHEATH) IMPLANT
WIRE GUIDERIGHT .035X150 (WIRE) ×3 IMPLANT
WIRE ROSEN-J .035X260CM (WIRE) IMPLANT

## 2019-03-15 NOTE — Interval H&P Note (Signed)
Cath Lab Visit (complete for each Cath Lab visit)  Clinical Evaluation Leading to the Procedure:   ACS: Yes.     Non-ACS:  n/a    History and Physical Interval Note:  03/15/2019 11:06 AM  Kevin Case  has presented today for surgery, with the diagnosis of Acute on chronic combined congestive heart failure with moderate aortic stenosis.  The various methods of treatment have been discussed with the patient and family. After consideration of risks, benefits and other options for treatment, the patient has consented to  Procedure(s): RIGHT/LEFT HEART CATH AND CORONARY ANGIOGRAPHY (N/A) as a surgical intervention.  The patient's history has been reviewed, patient examined, no change in status, stable for surgery.  I have reviewed the patient's chart and labs.  Questions were answered to the patient's satisfaction.     Lorine Bears

## 2019-03-15 NOTE — Progress Notes (Signed)
Progress Note  Patient Name: Kevin Case Date of Encounter: 03/15/2019  Primary Cardiologist: [facility] but he wants to switch his care to [place].  Subjective   He feels better with less shortness of breath.  The patient underwent a right and left cardiac catheterization today.  Before cardiac catheterization, he was noted to have very poor pulses in the upper extremities and thus the catheterization was done via the right femoral artery and vein.  He denies arm claudication  Inpatient Medications    Scheduled Meds: . aspirin  81 mg Oral Daily  . atorvastatin  80 mg Oral QHS  . furosemide  20 mg Oral Daily  . insulin aspart  0-5 Units Subcutaneous QHS  . insulin aspart  0-9 Units Subcutaneous TID WC  . ipratropium-albuterol  3 mL Nebulization TID  . losartan  25 mg Oral Daily  . pantoprazole  80 mg Oral Daily  . sodium chloride flush  3 mL Intravenous Q12H   Continuous Infusions: . sodium chloride 50 mL/hr at 03/14/19 1500  . sodium chloride 50 mL/hr at 03/15/19 1232  . sodium chloride    . sodium chloride Stopped (03/13/19 2043)   PRN Meds: sodium chloride, acetaminophen **OR** acetaminophen, albuterol, hydrALAZINE, ondansetron **OR** ondansetron (ZOFRAN) IV, polyethylene glycol, sodium chloride flush   Vital Signs    Vitals:   03/15/19 1300 03/15/19 1330 03/15/19 1354 03/15/19 1359  BP: 137/64 (!) 157/63 (!) 111/57   Pulse:  73 (!) 58   Resp: (!) 23 14    Temp:      TempSrc:      SpO2: 97% 100% 100% 99%  Weight:      Height:        Intake/Output Summary (Last 24 hours) at 03/15/2019 1613 Last data filed at 03/15/2019 1300 Gross per 24 hour  Intake 240 ml  Output 960 ml  Net -720 ml   Last 3 Weights 03/15/2019 03/14/2019 03/12/2019  Weight (lbs) 157 lb 9.4 oz 157 lb 4.8 oz 161 lb 8 oz  Weight (kg) 71.48 kg 71.351 kg 73.256 kg      Telemetry    Normal sinus rhythm with left bundle branch block- Personally Reviewed  ECG     - Personally Reviewed  Physical Exam    GEN: No acute distress.   Neck: No JVD Cardiac: RRR, no rubs, or gallops.  2 out of 6 crescendo decrescendo systolic murmur in the aortic area which is early peaking Respiratory: Clear to auscultation bilaterally. GI: Soft, nontender, non-distended  MS: No edema; No deformity. Neuro:  Nonfocal  Psych: Normal affect   Labs    Chemistry Recent Labs  Lab 03/12/19 1644 03/13/19 0754 03/15/19 0356  NA 140 140 143  K 3.7 3.8 3.9  CL 110 111 114*  CO2 19* 23 23  GLUCOSE 188* 126* 150*  BUN 26* 26* 27*  CREATININE 1.27* 1.40* 1.35*  CALCIUM 8.3* 8.2* 8.2*  PROT 7.3 6.2*  --   ALBUMIN 3.5 2.8*  --   AST 49* 50*  --   ALT 34 30  --   ALKPHOS 67 54  --   BILITOT 0.9 0.7  --   GFRNONAA 55* 49* 51*  GFRAA >60 57* 59*  ANIONGAP Hematology Recent Labs  Lab 03/13/19 0553 03/14/19 0419 03/15/19 0356  WBC 10.1 8.3 7.4  RBC 3.62* 3.51* 3.46*  HGB 9.1* 8.8* 8.6*  HCT 29.9* 29.2* 28.4*  MCV 82.6 83.2 82.1  MCH  25.1* 25.1* 24.9*  MCHC 30.4 30.1 30.3  RDW 15.4 15.8* 15.7*  PLT 176 179 164    Cardiac Enzymes Recent Labs  Lab 03/12/19 1644 03/12/19 2034 03/13/19 0206 03/13/19 0754  TROPONINI 0.06* 1.60* 3.53* 3.44*   No results for input(s): TROPIPOC in the last 168 hours.   BNP Recent Labs  Lab 03/12/19 1644  BNP 864.0*     DDimer No results for input(s): DDIMER in the last 168 hours.   Radiology    Dg Chest 1 View  Result Date: 03/13/2019 CLINICAL DATA:  Pulmonary edema EXAM: CHEST  1 VIEW COMPARISON:  03/12/2019 FINDINGS: Cardiac shadow is stable. Previously seen vascular congestion and pulmonary edema has improved significantly with mild residual remaining. No focal infiltrate is seen. No bony abnormality is noted. IMPRESSION: Significant improvement in pulmonary edema when compared with the previous day. Electronically Signed   By: Alcide Clever M.D.   On: 03/13/2019 21:32    Cardiac Studies   Echocardiogram on 03/13/2019:  1. The left  ventricle has moderate-severely reduced systolic function, with an ejection fraction of 30-35%. The cavity size was normal. There is mild concentric left ventricular hypertrophy. Left ventricular diastolic Doppler parameters are consistent  with impaired relaxation.  2. The right ventricle has normal systolic function. The cavity was normal. There is no increase in right ventricular wall thickness.  3. Left atrial size was mildly dilated.  4. Moderate mitral annular dilatation.  5. The mitral valve is grossly normal.  6. The tricuspid valve is grossly normal.  7. The aortic valve is tricuspid. Moderate thickening of the aortic valve. Moderate calcification of the aortic valve. Aortic valve regurgitation was not assessed by color flow Doppler. Moderate stenosis of the aortic valve. Mean gradient was 18 mm Hg  but valve area was 0.76. Visually, the stenosis seems moderate and not severe.  Cardiac catheterization today:  1.  Left dominant coronary arteries with significant mid left circumflex stenosis.  The coronary arteries are overall heavily calcified. 2.  Right heart catheterization showed normal filling pressures, normal pulmonary pressure and normal cardiac output. 3.  Mild aortic stenosis with peak to peak gradient of only 10 mmHg. 4.  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  systolic gradient between right arm noninvasive pressure and central aortic pressure.  of systolic gradient between left arm noninvasive pressure and central aortic pressure.  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  According the patient, he was taken off antihypertensive medications about 1 to 2 years ago because his blood pressure was running low.  It seems that his blood  pressure was falsely low given significant obstructive disease affecting arterial system in both arms.   I Am going to resume losartan.  No beta-blocker due to bradycardia.  Check blood pressure in the left arm which is closer to central aortic pressure. Outpatient left subclavian artery stenting can be considered to at least be able to obtain accurate blood pressure readings on him. The patient can likely be discharged home today or tomorrow on small dose furosemide 20 mg once daily.   For his left circumflex stenosis, outpatient atherectomy and stenting can be considered for refractory angina.   Patient Profile     78 y.o. male with history of diabetes, essential hypertension, left bundle branch block and bradycardia who  presented with chest pain and shortness of breath and was found to be in heart failure with mildly elevated troponin  Assessment & Plan    1.  Non-ST elevation myocardial infarction: Cardiac catheterization was done today which showed severely calcified vessels and 80% stenosis in the left circumflex.  This appears to be chronic and I did not see a culprit for myocardial infarction.  I recommend aggressive medical therapy for now.  If he has refractory angina, left circumflex atherectomy and stent placement can be considered.  2.  Acute systolic heart failure: EF was 30 to 35%.  I suspect this is likely due to hypertensive heart disease.  I think the patient has been undertreated for hypertension due to falsely low readings in his arms related to significant bilateral subclavian artery stenosis.  The left arm was closer to central aortic pressure but still there was in difference.  Interestingly, the patient's systolic blood pressure was 140 on presentation which means that his central aortic pressure was between 180 to 200 mmHg depending on which arm it was checked. I resumed losartan which should be gradually increased. No beta-blocker due to chronic bradycardia.  Volume status appears optimal on right heart catheterization and the patient will need a small dose furosemide upon discharge.  3.  Aortic stenosis: This was moderate by echo but was only mild by cath.  4.  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  He has heavily calcified vessels.  I am planning to obtain carotid Doppler as an outpatient and then consider proceeding with angiography and endovascular intervention at least on one side in order to improve vertebral flow and also be able to check blood pressure accurately in 1 arm.  I discussed the findings with the patient's wife and I also discussed with Dr. Amado Coe  The patient can likely be discharged home tomorrow.       For questions or updates, please contact [facility] Please consult [email or web address] for contact info under        Signed, Lorine Bears, MD  03/15/2019, 4:13 PM

## 2019-03-15 NOTE — Progress Notes (Signed)
Pt returns from cath lab. Right groin is clean, dry and intact. No bleeding nor hematoma noted. Pulses are positive. Pt has no complaints of pain. I will continue to assess.

## 2019-03-15 NOTE — Progress Notes (Signed)
[facility] [place] at [facility]   PATIENT NAME: Kevin Case    MR#:  [id number]  DATE OF BIRTH:  18-Jun-1941  SUBJECTIVE:  CHIEF COMPLAINT: Patient is resting comfortably.   Seen after cardiac cath.   Denies any chest pain or shortness of breath.   REVIEW OF SYSTEMS:  CONSTITUTIONAL: No fever, fatigue or weakness.  EYES: No blurred or double vision.  EARS, NOSE, AND THROAT: No tinnitus or ear pain.  RESPIRATORY: No cough, shortness of breath, wheezing or hemoptysis.  CARDIOVASCULAR: No chest pain, orthopnea, edema.  GASTROINTESTINAL: No nausea, vomiting, diarrhea or abdominal pain.  GENITOURINARY: No dysuria, hematuria.  ENDOCRINE: No polyuria, nocturia,  HEMATOLOGY: No anemia, easy bruising or bleeding SKIN: No rash or lesion. MUSCULOSKELETAL: No joint pain or arthritis.   NEUROLOGIC: No tingling, numbness, weakness.  PSYCHIATRY: No anxiety or depression.   DRUG ALLERGIES:  No Known Allergies  VITALS:  Blood pressure (!) 127/57, pulse (!) 59, temperature 98.6 F (37 C), temperature source Oral, resp. rate (!) 21, height 5\' 9"  (1.753 m), weight 71.5 kg, SpO2 99 %.  PHYSICAL EXAMINATION:  GENERAL:  78 y.o.-year-old patient lying in the bed with no acute distress.  EYES: Pupils equal, round, reactive to light and accommodation. No scleral icterus. Extraocular muscles intact.  HEENT: Head atraumatic, normocephalic. Oropharynx and nasopharynx clear.  NECK:  Supple, no jugular venous distention. No thyroid enlargement, no tenderness.  LUNGS: Normal breath sounds bilaterally, no wheezing, rales,rhonchi or crepitation. No use of accessory muscles of respiration.  CARDIOVASCULAR: S1, S2 normal. No murmurs, rubs, or gallops.  ABDOMEN: Soft, nontender, nondistended. Bowel sounds present. EXTREMITIES: No pedal edema, cyanosis, or clubbing.  NEUROLOGIC: Awake alert and oriented x3 sensation intact. Gait not checked.  PSYCHIATRIC: The patient is alert and  oriented x 3.  SKIN: No obvious rash, lesion, or ulcer.    LABORATORY PANEL:   CBC Recent Labs  Lab 03/15/19 0356  WBC 7.4  HGB 8.6*  HCT 28.4*  PLT 164   ------------------------------------------------------------------------------------------------------------------  Chemistries  Recent Labs  Lab 03/13/19 0754 03/15/19 0356  NA 140 143  K 3.8 3.9  CL 111 114*  CO2 23 23  GLUCOSE 126* 150*  BUN 26* 27*  CREATININE 1.40* 1.35*  CALCIUM 8.2* 8.2*  AST 50*  --   ALT 30  --   ALKPHOS 54  --   BILITOT 0.7  --    ------------------------------------------------------------------------------------------------------------------  Cardiac Enzymes Recent Labs  Lab 03/13/19 0754  TROPONINI 3.44*   ------------------------------------------------------------------------------------------------------------------  RADIOLOGY:  Dg Chest 1 View  Result Date: 03/13/2019 CLINICAL DATA:  Pulmonary edema EXAM: CHEST  1 VIEW COMPARISON:  03/12/2019 FINDINGS: Cardiac shadow is stable. Previously seen vascular congestion and pulmonary edema has improved significantly with mild residual remaining. No focal infiltrate is seen. No bony abnormality is noted. IMPRESSION: Significant improvement in pulmonary edema when compared with the previous day. Electronically Signed   By: Alcide Clever M.D.   On: 03/13/2019 21:32    EKG:   Orders placed or performed during the hospital encounter of 03/12/19  . ED EKG  . ED EKG  . EKG 12-Lead  . EKG 12-Lead  . EKG 12-Lead  . EKG 12-Lead    ASSESSMENT AND PLAN:    Acute hypoxic respiratory failure-likely secondary to acute on chronic systolic CHF exacerbation.   Chest x-ray with diffuse opacities, likely diffuse pulmonary edema.   Initially requiring BiPAP, but transitioned to nasal cannula.  -COVID negative -Wean O2 to  room air as able -Lasix discontinued in view of hypotension -[facility] cardiology is following scheduled for cardiac cath on  Monday -Current echo cardiogram has revealed 30 to 35% of ejection fraction  which has gotten worse from /2017-previously it was at 55 to 60% EF --Daily weights, strict I/O  Non-STEMI s/p heparin drip Had cardiac catheterization on 03/15/2019. No stents placed -Repeat echocardiogram results as above [facility] cardiology is following -Aspirin, Cozaar, Lasix and statin -Not considering beta-blocker in view of chronic bradycardia- -outpatient follow-up with cardiology Dr. Kirke Corin in a week or sooner as needed  AKI- baseline creatinine 0.8-1.0 Provide gentle hydration with IV fluids with close monitoring for symptoms and signs of fluid overload as his ejection fraction is 30 to 35% Creatinine is at 1.40 -Will need to monitor kidney function closely with IV diuresis -Hold home losartan and Lasix -Avoid nephrotoxic agents -Recheck kidney function in the morning  Hypertension- --pt has significant subclavian stenosis and needs stent placement soon, will be done by dr.Arida soon  -Patient has blood pressure discrepancy between the right and left shoulders and the plan is to check his blood pressure on the left side and down 40 points to get the accurate central pressure -resume home losartan.  Patient is started on Lasix 20 mg p.o. once daily -Not considering beta-blocker in view of chronic bradycardia -Hydralazine IV as needed for high BP -  Type 2 diabetes- blood sugars elevated in the ED -SSI during hospital course -Holding home medication in view of renal insufficiency -Diabetic coordinator consulted as patient is refusing insulin shots  Hyperlipidemia-stable -Continue home Lipitor    All the records are reviewed and case discussed with Care Management/Social Workerr. Management plans discussed with the patient at bedside and wife over phone and they are in agreement.  CODE STATUS: Partial code  TOTAL TIME TAKING CARE OF THIS PATIENT: 35 minutes.   POSSIBLE D/C IN 1  DAYS,  DEPENDING ON CLINICAL CONDITION.  Note: This dictation was prepared with Dragon dictation along with smaller phrase technology. Any transcriptional errors that result from this process are unintentional.   Ramonita Lab M.D on 03/15/2019 at 1:16 PM  Between 7am to 6pm - Pager - [phone] After 6pm go to [email or web address] - password [email or web address] [facility]  [facility]  Office  [phone]  CC: Primary care physician; Ebbie Ridge, PA-C

## 2019-03-15 NOTE — Progress Notes (Signed)
Inpatient Diabetes Program Recommendations  AACE/ADA: New Consensus Statement on Inpatient Glycemic Control   Target Ranges:  Prepandial:   less than 140 mg/dL      Peak postprandial:   less than 180 mg/dL (1-2 hours)      Critically ill patients:  140 - 180 mg/dL   Results for Kevin Case, Kevin Case (MRN [id number]) as of 03/15/2019 10:12  Ref. Range 03/14/2019 07:50 03/14/2019 12:11 03/14/2019 16:24 03/14/2019 21:17 03/15/2019 08:17  Glucose-Capillary Latest Ref Range: 70 - 99 mg/dL 709 (H)  Novolog 1 unit 215 (H)  Novolog 3 units 124 (H)  Novolog 1 unit 114 (H) 103 (H)   Review of Glycemic Control  Diabetes history: DM2 Outpatient Diabetes medications: Jardiance 10 mg daily Current orders for Inpatient glycemic control: Novolog 0-9 units TID with meals, Novolog 0-5 units QHS  NOTE: Noted consult for Diabetes Coordinator for "Pt refusing SQ insulin". However, in reviewing chart, noted patient has been receiving Novolog correction insulin as ordered and glucose is fairly well controlled. Patient is currently in Cath Lab and fasting glucose 103 mg/dl today. Will sign off consult. Please re-consult if needed.   Thanks, Orlando Penner, RN, MSN, CDE Diabetes Coordinator Inpatient Diabetes Program [phone] (Team Pager from 8am to 5pm)

## 2019-03-15 NOTE — Care Management Important Message (Signed)
Important Message  Patient Details  Name: Kevin Case MRN: [id number] Date of Birth: Jun 17, 1941   Medicare Important Message Given:  Yes    Johnell Comings 03/15/2019, 11:54 AM

## 2019-03-15 NOTE — Progress Notes (Signed)
ANTICOAGULATION CONSULT NOTE - Initial Consult  Pharmacy Consult for heparin drip Indication: chest pain/ACS  No Known Allergies  Patient Measurements: Height: 5\' 9"  (175.3 cm) Weight: 157 lb 9.4 oz (71.5 kg) IBW/kg (Calculated) : 70.7 Heparin Dosing Weight: 73 kg  Vital Signs: Temp: 98.6 F (37 C) (05/11 0407) Temp Source: Oral (05/11 0407) BP: 101/67 (05/11 0407) Pulse Rate: 71 (05/11 0407)  Labs: Recent Labs    03/12/19 1644 03/12/19 2034 03/13/19 0206  03/13/19 0553 03/13/19 0754 03/13/19 1413 03/14/19 0419 03/15/19 0356  HGB 10.9*  --   --   --  9.1*  --   --  8.8* 8.6*  HCT 36.0*  --   --   --  29.9*  --   --  29.2* 28.4*  PLT 218  --   --   --  176  --   --  179 164  APTT 27  --   --   --   --   --   --   --   --   LABPROT 14.4  --   --   --   --   --   --   --   --   INR 1.1  --   --   --   --   --   --   --   --   HEPARINUNFRC  --   --   --    < > 0.61  --  0.61 0.54 0.50  CREATININE 1.27*  --   --   --   --  1.40*  --   --  1.35*  TROPONINI 0.06* 1.60* 3.53*  --   --  3.44*  --   --   --    < > = values in this interval not displayed.    Estimated Creatinine Clearance: 47.3 mL/min (A) (by C-G formula based on SCr of 1.35 mg/dL (H)).   Medical History: Past Medical History:  Diagnosis Date  . Diabetes mellitus without complication (HCC)   . Hypertension   . Rib fracture     Medications:  Scheduled:  . [START ON 03/16/2019] aspirin  81 mg Oral Pre-Cath  . atorvastatin  80 mg Oral QHS  . insulin aspart  0-5 Units Subcutaneous QHS  . insulin aspart  0-9 Units Subcutaneous TID WC  . ipratropium-albuterol  3 mL Nebulization TID  . pantoprazole  80 mg Oral Daily    Assessment: Patient was admitted for SOB was found to have an initial trop of 0.06 then >> 1.6. EKG shows QRS widening and QT prolongation. Patient is being started on heparin for NSTEMI Patient is not on any PTA anticoagulation. Patient received one dose of lovenox 40 mg subq x  1  Goal of Therapy:  Heparin level 0.3-0.7 units/ml Monitor platelets by anticoagulation protocol: Yes   Plan:  05/09 @ 0500 HL 0.50 therapeutic. Will continue current rate and will recheck HL w/ am labs CBC trended down, trops coming down, will continue to monitor.  Thomasene Ripple, PharmD, BCPS Clinical Pharmacist 03/15/2019

## 2019-03-15 NOTE — Plan of Care (Signed)
  Problem: Activity: Goal: Ability to tolerate increased activity will improve Outcome: Progressing   Problem: Cardiac: Goal: Ability to achieve and maintain adequate cardiovascular perfusion will improve Outcome: Progressing   

## 2019-03-16 ENCOUNTER — Telehealth: Payer: Self-pay | Admitting: *Deleted

## 2019-03-16 DIAGNOSIS — I771 Stricture of artery: Secondary | ICD-10-CM

## 2019-03-16 DIAGNOSIS — R42 Dizziness and giddiness: Secondary | ICD-10-CM

## 2019-03-16 LAB — GLUCOSE, CAPILLARY
Glucose-Capillary: 146 mg/dL — ABNORMAL HIGH (ref 70–99)
Glucose-Capillary: 200 mg/dL — ABNORMAL HIGH (ref 70–99)

## 2019-03-16 MED ORDER — TRAMADOL HCL 50 MG PO TABS: 50.0000 mg | ORAL_TABLET | Freq: Four times a day (QID) | ORAL | 0 refills | Status: DC | PRN

## 2019-03-16 MED ORDER — ASPIRIN 81 MG PO CHEW: 81.0000 mg | CHEWABLE_TABLET | Freq: Every day | ORAL | 0 refills | Status: DC

## 2019-03-16 MED ORDER — LOSARTAN POTASSIUM 25 MG PO TABS: 25.0000 mg | ORAL_TABLET | Freq: Every day | ORAL | 0 refills | Status: DC

## 2019-03-16 NOTE — Telephone Encounter (Signed)
03/25/2019 is fine for both visits. Please change to in office. Thank you.

## 2019-03-16 NOTE — Plan of Care (Signed)
  Problem: Education: Goal: Individualized Educational Video(s) Outcome: Progressing   Problem: Activity: Goal: Ability to tolerate increased activity will improve Outcome: Progressing   Problem: Cardiac: Goal: Ability to achieve and maintain adequate cardiovascular perfusion will improve Outcome: Progressing   Problem: Clinical Measurements: Goal: Respiratory complications will improve Outcome: Progressing Goal: Cardiovascular complication will be avoided Outcome: Progressing   Problem: Elimination: Goal: Will not experience complications related to bowel motility Outcome: Progressing

## 2019-03-16 NOTE — Discharge Summary (Signed)
[facility] [place] at [facility]   PATIENT NAME: Kevin Case    MR#:  [id number]  DATE OF BIRTH:  1941/01/03  DATE OF ADMISSION:  03/12/2019 ADMITTING PHYSICIAN: Campbell Stall, MD  DATE OF DISCHARGE: No discharge date for patient encounter.  PRIMARY CARE PHYSICIAN: Ebbie Ridge, PA-C    ADMISSION DIAGNOSIS:  SOB (shortness of breath) [R06.02]  DISCHARGE DIAGNOSIS:  Active Problems:   Acute respiratory failure with hypoxia (HCC)   Non-ST elevation (NSTEMI) myocardial infarction (HCC)   Acute systolic heart failure (HCC)   SECONDARY DIAGNOSIS:   Past Medical History:  Diagnosis Date  . Diabetes mellitus without complication (HCC)   . Hypertension   . Rib fracture     HOSPITAL COURSE:  Acute hypoxic respiratory failure Resolved  likely secondary toacute on chronic systolicCHF exacerbation and non-STEMI Successfully weaned off oxygen  *Acute non-STEMI Status post heart catheterization by Dr. [place] Sink on Mar 15, 2019-medical management recommended going forward, echo cardiogram 30 to 35% of ejection fraction which has gotten worse from /2017-previously it was at 55 to 60% EF Treated on our ACS protocol, beta-blocker therapy avoided given chronic bradycardia, Aspirin, Cozaar, and statin   *Acute on chronic systolic congestive heart failure exacerbation Exacerbated by uncontrolled hypertension Treated on our congestive heart failure protocol, blood pressure control per below, continue Lasix, losartan  *AKI Resolved with gentle IV fluids for rehydration  *Hypertension pt has significant bilateral subclavian stenosis and needs stent placement soon-currently being followed by Dr. Rita/cardiology Patient has blood pressure discrepancy between the right and left shoulders and the plan is to check his blood pressure on the left side and down 40 points to get the accurate central pressure  *Type 2 diabetes Stable on current regiment Diabetic  coordinator consulted as patient is refusing insulin shots  *Hyperlipidemia stable Continue home Lipitor   DISCHARGE CONDITIONS:   stable  CONSULTS OBTAINED:  Treatment Team:  Iran Ouch, MD  DRUG ALLERGIES:  No Known Allergies  DISCHARGE MEDICATIONS:   Allergies as of 03/16/2019   No Known Allergies     Medication List    TAKE these medications   aspirin 81 MG chewable tablet Chew 1 tablet (81 mg total) by mouth daily. Start taking on:  Mar 17, 2019   atorvastatin 80 MG tablet Commonly known as:  LIPITOR Take 1 tablet by mouth at bedtime.   furosemide 20 MG tablet Commonly known as:  LASIX Take 1 tablet by mouth daily.   Jardiance 10 MG Tabs tablet Generic drug:  empagliflozin Take 10 mg by mouth daily.   losartan 25 MG tablet Commonly known as:  COZAAR Take 25 mg by mouth daily.   omeprazole 40 MG capsule Commonly known as:  PRILOSEC Take 40 mg by mouth daily.   traMADol 50 MG tablet Commonly known as:  Ultram Take 1 tablet (50 mg total) by mouth every 6 (six) hours as needed.        DISCHARGE INSTRUCTIONS:   If you experience worsening of your admission symptoms, develop shortness of breath, life threatening emergency, suicidal or homicidal thoughts you must seek medical attention immediately by calling 911 or calling your MD immediately  if symptoms less severe.  You Must read complete instructions/literature along with all the possible adverse reactions/side effects for all the Medicines you take and that have been prescribed to you. Take any new Medicines after you have completely understood and accept all the possible adverse reactions/side effects.   Please note  You were cared for by a hospitalist during your hospital stay. If you have any questions about your discharge medications or the care you received while you were in the hospital after you are discharged, you can call the unit and asked to speak with the hospitalist on call if  the hospitalist that took care of you is not available. Once you are discharged, your primary care physician will handle any further medical issues. Please note that NO REFILLS for any discharge medications will be authorized once you are discharged, as it is imperative that you return to your primary care physician (or establish a relationship with a primary care physician if you do not have one) for your aftercare needs so that they can reassess your need for medications and monitor your lab values.    Today   CHIEF COMPLAINT:   Chief Complaint  Patient presents with  . Shortness of Breath    HISTORY OF PRESENT ILLNESS:  78 y.o. male with a known history of hypertension and diabetes who presented to the ED with gradually worsening shortness of breath over the last 3 days.  He states that his shortness of breath was so bad this morning that it "brought him to his knees".  He also endorses some worsening lower extremity edema over the last couple days.  He denies any orthopnea, cough, fevers, chills.  He endorses some "chest tightness", which has completely resolved.  In the ED, he had significantly increased work of breathing and was placed on nonrebreather mask and then BiPAP.  His work of breathing significantly improved, and he was weaned back to nasal cannula.  Labs are significant for creatinine 1.27, BNP 864, troponin 0.06. Chest x-ray with diffuse airspace opacities, likely diffuse pulmonary edema.  He was given Lasix IV x1 and hospitalists were called for admission.    VITAL SIGNS:  Blood pressure 101/72, pulse 70, temperature 98.5 F (36.9 C), temperature source Oral, resp. rate 18, height 5\' 9"  (1.753 m), weight 70.3 kg, SpO2 92 %.  I/O:    Intake/Output Summary (Last 24 hours) at 03/16/2019 1127 Last data filed at 03/16/2019 0911 Gross per 24 hour  Intake 1313.44 ml  Output 2030 ml  Net -716.56 ml    PHYSICAL EXAMINATION:  GENERAL:  78 y.o.-year-old patient lying in  the bed with no acute distress.  EYES: Pupils equal, round, reactive to light and accommodation. No scleral icterus. Extraocular muscles intact.  HEENT: Head atraumatic, normocephalic. Oropharynx and nasopharynx clear.  NECK:  Supple, no jugular venous distention. No thyroid enlargement, no tenderness.  LUNGS: Normal breath sounds bilaterally, no wheezing, rales,rhonchi or crepitation. No use of accessory muscles of respiration.  CARDIOVASCULAR: S1, S2 normal. No murmurs, rubs, or gallops.  ABDOMEN: Soft, non-tender, non-distended. Bowel sounds present. No organomegaly or mass.  EXTREMITIES: No pedal edema, cyanosis, or clubbing.  NEUROLOGIC: Cranial nerves II through XII are intact. Muscle strength 5/5 in all extremities. Sensation intact. Gait not checked.  PSYCHIATRIC: The patient is alert and oriented x 3.  SKIN: No obvious rash, lesion, or ulcer.   DATA REVIEW:   CBC Recent Labs  Lab 03/15/19 0356  WBC 7.4  HGB 8.6*  HCT 28.4*  PLT 164    Chemistries  Recent Labs  Lab 03/13/19 0754 03/15/19 0356  NA 140 143  K 3.8 3.9  CL 111 114*  CO2 23 23  GLUCOSE 126* 150*  BUN 26* 27*  CREATININE 1.40* 1.35*  CALCIUM 8.2* 8.2*  AST 50*  --  ALT 30  --   ALKPHOS 54  --   BILITOT 0.7  --     Cardiac Enzymes Recent Labs  Lab 03/13/19 0754  TROPONINI 3.44*    Microbiology Results  Results for orders placed or performed during the hospital encounter of 03/12/19  SARS Coronavirus 2 (CEPHEID - Performed in [facility] hospital lab), Hosp Order     Status: None   Collection Time: 03/12/19  4:44 PM  Result Value Ref Range Status   SARS Coronavirus 2 NEGATIVE NEGATIVE Final    Comment: (NOTE) If result is NEGATIVE SARS-CoV-2 target nucleic acids are NOT DETECTED. The SARS-CoV-2 RNA is generally detectable in upper and lower  respiratory specimens during the acute phase of infection. The lowest  concentration of SARS-CoV-2 viral copies this assay can detect is 250  copies  / mL. A negative result does not preclude SARS-CoV-2 infection  and should not be used as the sole basis for treatment or other  patient management decisions.  A negative result may occur with  improper specimen collection / handling, submission of specimen other  than nasopharyngeal swab, presence of viral mutation(s) within the  areas targeted by this assay, and inadequate number of viral copies  (<250 copies / mL). A negative result must be combined with clinical  observations, patient history, and epidemiological information. If result is POSITIVE SARS-CoV-2 target nucleic acids are DETECTED. The SARS-CoV-2 RNA is generally detectable in upper and lower  respiratory specimens dur ing the acute phase of infection.  Positive  results are indicative of active infection with SARS-CoV-2.  Clinical  correlation with patient history and other diagnostic information is  necessary to determine patient infection status.  Positive results do  not rule out bacterial infection or co-infection with other viruses. If result is PRESUMPTIVE POSTIVE SARS-CoV-2 nucleic acids MAY BE PRESENT.   A presumptive positive result was obtained on the submitted specimen  and confirmed on repeat testing.  While 2019 novel coronavirus  (SARS-CoV-2) nucleic acids may be present in the submitted sample  additional confirmatory testing may be necessary for epidemiological  and / or clinical management purposes  to differentiate between  SARS-CoV-2 and other Sarbecovirus currently known to infect humans.  If clinically indicated additional testing with an alternate test  methodology [phone]) is advised. The SARS-CoV-2 RNA is generally  detectable in upper and lower respiratory sp ecimens during the acute  phase of infection. The expected result is Negative. Fact Sheet for Patients:  BoilerBrush.com.cy Fact Sheet for Healthcare Providers: https://pope.com/ This test  is not yet approved or cleared by the [place] FDA and has been authorized for detection and/or diagnosis of SARS-CoV-2 by FDA under an Emergency Use Authorization (EUA).  This EUA will remain in effect (meaning this test can be used) for the duration of the COVID-19 declaration under Section 564(b)(1) of the Act, 21 U.S.C. section 360bbb-3(b)(1), unless the authorization is terminated or revoked sooner. Performed at [facility], [address], [place], [place] [address]     RADIOLOGY:  No results found.  EKG:   Orders placed or performed during the hospital encounter of 03/12/19  . ED EKG  . ED EKG  . EKG 12-Lead  . EKG 12-Lead  . EKG 12-Lead  . EKG 12-Lead      Management plans discussed with the patient, family and they are in agreement.  CODE STATUS:     Code Status Orders  (From admission, onward)  Start     Ordered   03/12/19 2039  Limited resuscitation (code)  Continuous    Question Answer Comment  In the event of cardiac or respiratory ARREST: Initiate Code Blue, Call Rapid Response Yes   In the event of cardiac or respiratory ARREST: Perform CPR Yes   In the event of cardiac or respiratory ARREST: Perform Intubation/Mechanical Ventilation No   In the event of cardiac or respiratory ARREST: Use NIPPV/BiPAp only if indicated Yes   In the event of cardiac or respiratory ARREST: Administer ACLS medications if indicated Yes   In the event of cardiac or respiratory ARREST: Perform Defibrillation or Cardioversion if indicated Yes      03/12/19 2038        Code Status History    This patient has a current code status but no historical code status.      TOTAL TIME TAKING CARE OF THIS PATIENT: 40 minutes.    Evelena Asa  M.D on 03/16/2019 at 11:27 AM  Between 7am to 6pm - Pager - [phone]  After 6pm go to [email or web address] - password [email or web address]  [facility]  Office  [phone]  CC: Primary care physician;  Ebbie Ridge, PA-C   Note: This dictation was prepared with Dragon dictation along with smaller phrase technology. Any transcriptional errors that result from this process are unintentional.

## 2019-03-16 NOTE — Telephone Encounter (Signed)
Please advise where to overbook or can the current appt stand and have carotid that day ?

## 2019-03-16 NOTE — Telephone Encounter (Signed)
-----   Message from Iran Ouch, MD sent at 03/15/2019  4:28 PM EDT ----- This patient will likely be discharged home tomorrow.  Please schedule him for carotid Doppler within 1 week (for dizziness and bilateral subclavian artery stenosis) and office follow-up visit with me on the same day.

## 2019-03-16 NOTE — Progress Notes (Signed)
   Patient discharged before seen by cardiology.  Signed, Lennon Alstrom, PA-C 03/16/2019, 12:40 PM Pager [phone]

## 2019-03-22 ENCOUNTER — Ambulatory Visit: Payer: Medicare Other | Attending: Family | Admitting: Family

## 2019-03-22 ENCOUNTER — Encounter: Payer: Self-pay | Admitting: Family

## 2019-03-22 ENCOUNTER — Other Ambulatory Visit: Payer: Self-pay

## 2019-03-22 ENCOUNTER — Telehealth: Payer: Self-pay

## 2019-03-22 VITALS — BP 94/66 | Wt 154.0 lb

## 2019-03-22 DIAGNOSIS — I5022 Chronic systolic (congestive) heart failure: Secondary | ICD-10-CM

## 2019-03-22 DIAGNOSIS — E119 Type 2 diabetes mellitus without complications: Secondary | ICD-10-CM

## 2019-03-22 DIAGNOSIS — I10 Essential (primary) hypertension: Secondary | ICD-10-CM

## 2019-03-22 NOTE — Telephone Encounter (Signed)
TELEPHONE CALL NOTE  Kevin Case has been deemed a candidate for a follow-up tele-health visit to limit community exposure during the Covid-19 pandemic. I spoke with the patient via phone to ensure availability of phone/video source, confirm preferred email & phone number, discuss instructions and expectations, and review consent.   I reminded Kevin Case to be prepared with any vital sign and/or heart rhythm information that could potentially be obtained via home monitoring, at the time of his visit.  Finally, I reminded Kevin Case to expect an e-mail containing a link for their video-based visit approximately 15 minutes before his visit, or alternatively, a phone call at the time of his visit if his visit is planned to be a phone encounter.  Did the patient verbally consent to treatment as below? YES  Kevin Case, CMA 03/22/2019 8:30 AM  CONSENT FOR TELE-HEALTH VISIT - PLEASE REVIEW  I hereby voluntarily request, consent and authorize [facility] and its employed or contracted physicians, physician assistants, nurse practitioners or other licensed health care professionals (the Practitioner), to provide me with telemedicine health care services (the "Services") as deemed necessary by the treating Practitioner. I acknowledge and consent to receive the Services by the Practitioner via telemedicine. I understand that the telemedicine visit will involve communicating with the Practitioner through telephonic communication technology and the disclosure of certain medical information by electronic transmission. I acknowledge that I have been given the opportunity to request an in-person assessment or other available alternative prior to the telemedicine visit and am voluntarily participating in the telemedicine visit.  I understand that I have the right to withhold or withdraw my consent to the use of telemedicine in the course of my care at any time, without affecting my  right to future care or treatment, and that the Practitioner or I may terminate the telemedicine visit at any time. I understand that I have the right to inspect all information obtained and/or recorded in the course of the telemedicine visit and may receive copies of available information for a reasonable fee.  I understand that some of the potential risks of receiving the Services via telemedicine include:  Marland Kitchen Delay or interruption in medical evaluation due to technological equipment failure or disruption; . Information transmitted may not be sufficient (e.g. poor resolution of images) to allow for appropriate medical decision making by the Practitioner; and/or  . In rare instances, security protocols could fail, causing a breach of personal health information.  Furthermore, I acknowledge that it is my responsibility to provide information about my medical history, conditions and care that is complete and accurate to the best of my ability. I acknowledge that Practitioner's advice, recommendations, and/or decision may be based on factors not within their control, such as incomplete or inaccurate data provided by me or lack of visual representation. I understand that the practice of medicine is not an exact science and that Practitioner makes no warranties or guarantees regarding treatment outcomes. I acknowledge that I will receive a copy of this consent concurrently upon execution via email to the email address I last provided but may also request a printed copy by calling the office of [facility].    I understand that my insurance may be billed for this visit.   I have read or had this consent read to me. . I understand the contents of this consent, which adequately explains the benefits and risks of the Services being provided via telemedicine.  . I have been  provided ample opportunity to ask questions regarding this consent and the Services and have had my questions answered to my  satisfaction. . I give my informed consent for the services to be provided through the use of telemedicine in my medical care  By participating in this telemedicine visit I agree to the above.

## 2019-03-22 NOTE — Telephone Encounter (Signed)
   TELEPHONE CALL NOTE  This patient has been deemed a candidate for follow-up tele-health visit to limit community exposure during the Covid-19 pandemic. I spoke with the patient via phone to discuss instructions. The patient was advised to review the section on consent for treatment as well. The patient will receive a phone call 2-3 days prior to their E-Visit at which time consent will be verbally confirmed. A Virtual Office Visit appointment type has been scheduled for 03/22/2019 with [facility].  Nira Retort L, CMA 03/22/2019 8:29 AM

## 2019-03-22 NOTE — Progress Notes (Signed)
Virtual Visit via Telephone Note    Evaluation Performed:  Initial visit  This visit type was conducted due to national recommendations for restrictions regarding the COVID-19 Pandemic (e.g. social distancing).  This format is felt to be most appropriate for this patient at this time.  All issues noted in this document were discussed and addressed.  No physical exam was performed (except for noted visual exam findings with Video Visits).  Please refer to the patient's chart (MyChart message for video visits and phone note for telephone visits) for the patient's consent to telehealth for [facility]  Date:  03/22/2019   ID:  Kevin SpruceEldred Coppa, DOB 03/25/1941, MRN [id number]  Patient Location:  [place] in [place]  Provider location:   [facility] [address] [place], [place] [address]  PCP:  Ebbie Ridgeressent, [place], PA-C  Cardiologist:  Lorine BearsArida, Muhammad, MD Electrophysiologist:  None   Chief Complaint:  Shortness of breath  History of Present Illness:    Kevin Spruceldred Crutchfield is a 78 y.o. male who presents via audio/video conferencing for a telehealth visit today.  Patient verified DOB and address.  The patient does not have symptoms concerning for COVID-19 infection (fever, chills, cough, or new SHORTNESS OF BREATH).   Patient reports moderate shortness of breath upon minimal exertion. He describes this as chronic in nature having been present for several years. He has associated calf pain, cough and fatigue along with this. He denies any dizziness, swelling in his legs/ abdomen, chest pain, difficulty sleeping or weight gain. Says that his weight ranges from 153-156 pounds. Is [facility] and [facility] to local providers.   Prior CV studies:   The following studies were reviewed today:  Echo report from 03/13/2019 reviewed and showed an EF of 30-35% along with moderate AS.   Catheterization done 03/15/2019:  Prox Cx  lesion is 20% stenosed.  Mid Cx lesion is 80% stenosed.  Prox LAD lesion is 50% stenosed.  Mid LAD lesion is 30% stenosed.  Ost 1st Diag lesion is 50% stenosed.   1.  Left dominant coronary arteries with significant mid left circumflex stenosis.  The coronary arteries are overall heavily calcified. 2.  Right heart catheterization showed normal filling pressures, normal pulmonary pressure and normal cardiac output. 3.  Mild aortic stenosis with peak to peak gradient of only 10 mmHg. 4.  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  60mmHg systolic gradient between right arm noninvasive pressure and central aortic pressure.  40mmHg of systolic gradient between left arm noninvasive pressure and central aortic pressure.  Past Medical History:  Diagnosis Date  . Diabetes mellitus without complication (HCC)   . Hypertension   . Rib fracture    Past Surgical History:  Procedure Laterality Date  . RIGHT/LEFT HEART CATH AND CORONARY ANGIOGRAPHY N/A 03/15/2019   Procedure: RIGHT/LEFT HEART CATH AND CORONARY ANGIOGRAPHY;  Surgeon: Iran OuchArida, Muhammad A, MD;  Location: [facility] INVASIVE CV LAB;  Service: Cardiovascular;  Laterality: N/A;     Current Meds  Medication Sig  . aspirin 81 MG chewable tablet Chew 1 tablet (81 mg total) by mouth daily.  Marland Kitchen. atorvastatin (LIPITOR) 80 MG tablet Take 1 tablet by mouth at bedtime.  . cyanocobalamin 100 MCG tablet Take 100 mcg by mouth daily.  Marland Kitchen. docusate sodium (COLACE) 100 MG capsule Take 100 mg by mouth daily as needed for mild constipation.  . furosemide (LASIX)  20 MG tablet Take 1 tablet by mouth daily.  Marland Kitchen JARDIANCE 10 MG TABS tablet Take 10 mg by mouth daily.  Marland Kitchen losartan (COZAAR) 25 MG tablet Take 1 tablet (25 mg total) by mouth daily.  . Melatonin 10 MG TABS Take 10 mg by mouth.  . Multiple Vitamin (MULTIVITAMIN) tablet Take 1 tablet by mouth daily.  . Omega-3 Fatty Acids (FISH  OIL OMEGA-3 PO) Take 300 mg by mouth daily.  Marland Kitchen omeprazole (PRILOSEC) 40 MG capsule Take 40 mg by mouth daily.     Allergies:   Patient has no known allergies.   Social History   Tobacco Use  . Smoking status: Former Games developer  . Smokeless tobacco: Never Used  Substance Use Topics  . Alcohol use: No    Frequency: Never  . Drug use: Not on file     Family Hx: The patient's family history includes Congestive Heart Failure in his father.  ROS:   Please see the history of present illness.     All other systems reviewed and are negative.   Labs/Other Tests and Data Reviewed:    Recent Labs: 03/12/2019: B Natriuretic Peptide 864.0 03/13/2019: ALT 30 03/15/2019: BUN 27; Creatinine, Ser 1.35; Hemoglobin 8.6; Platelets 164; Potassium 3.9; Sodium 143   Recent Lipid Panel No results found for: CHOL, TRIG, HDL, CHOLHDL, LDLCALC, LDLDIRECT  Wt Readings from Last 3 Encounters:  03/22/19 154 lb (69.9 kg)  03/16/19 155 lb (70.3 kg)  10/13/17 157 lb (71.2 kg)     Exam:    Vital Signs:  BP 94/66   Wt 154 lb (69.9 kg) Comment: self-reported  BMI 22.74 kg/m    Well nourished, well developed male in no  acute distress.   ASSESSMENT & PLAN:    1. 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 Instructed to call for an overnight weight gain of >2 pounds or a weekly weight gain of >5 pounds - not adding salt and is trying to follow a low sodium diet - saw cardiology Orson Aloe) 02/13/18 and is transferring to Dr. Kirke Corin on 03/25/2019 - BNP 03/12/2019 was 864.0  2: HTN- - BP low at home but patient's wife says they've been told to add 40 points to each reading; confirmed that they are checking it in his left arm - does have significant obstructive disease in arterial system in both arms - saw PCP Wilson Singer) 11/24/2018 and is transferring care to  [place] on 04/01/2019 - BMP 03/15/2019 reviewed and showed sodium 143, potassium 3.9, creatinine 1.35 and GFR 51  3: DM-   - not checking his glucose at home - on jardiance  COVID-19 Education: The signs and symptoms of COVID-19 were discussed with the patient and how to seek care for testing (follow up with PCP or arrange E-visit).  The importance of social distancing was discussed today.  Patient Risk:   After full review of this patients clinical status, I feel that they are at least moderate risk at this time.  Time:   Today, I have spent 17 minutes with the patient with telehealth technology discussing medications, diet and symptoms to report.     Medication Adjustments/Labs and Tests Ordered: Current medicines are reviewed at length with the patient today.  Concerns regarding medicines are outlined above.   Tests Ordered: No orders of the defined types were placed in this encounter.  Medication Changes: No orders of the defined types were placed in this encounter.   Disposition:  Follow-up in 6 weeks or sooner for any questions/problems before then.   Signed, Delma Freeze, FNP  03/22/2019 11:12 AM    [facility]

## 2019-03-22 NOTE — Patient Instructions (Signed)
Continue weighing daily and call for an overnight weight gain of > 2 pounds or a weekly weight gain of >5 pounds. 

## 2019-03-24 ENCOUNTER — Encounter: Payer: Self-pay | Admitting: Physician Assistant

## 2019-03-25 ENCOUNTER — Other Ambulatory Visit: Payer: Self-pay

## 2019-03-25 ENCOUNTER — Encounter: Payer: Self-pay | Admitting: Cardiovascular Disease

## 2019-03-25 ENCOUNTER — Ambulatory Visit: Payer: Medicare Other | Admitting: Cardiovascular Disease

## 2019-03-25 ENCOUNTER — Other Ambulatory Visit
Admission: RE | Admit: 2019-03-25 | Discharge: 2019-03-25 | Disposition: A | Discharge status: Home / Self Care | Payer: Medicare Other | Source: Home / Self Care | Attending: Cardiovascular Disease | Admitting: Cardiovascular Disease

## 2019-03-25 ENCOUNTER — Ambulatory Visit (INDEPENDENT_AMBULATORY_CARE_PROVIDER_SITE_OTHER): Payer: Medicare Other

## 2019-03-25 ENCOUNTER — Other Ambulatory Visit
Admission: RE | Admit: 2019-03-25 | Discharge: 2019-03-25 | Disposition: A | Discharge status: Home / Self Care | Payer: Medicare Other | Source: Ambulatory Visit | Attending: Cardiovascular Disease | Admitting: Cardiovascular Disease

## 2019-03-25 VITALS — BP 119/44 | HR 50 | Ht 69.0 in | Wt 157.0 lb

## 2019-03-25 DIAGNOSIS — Z1159 Encounter for screening for other viral diseases: Secondary | ICD-10-CM | POA: Diagnosis not present

## 2019-03-25 DIAGNOSIS — I5022 Chronic systolic (congestive) heart failure: Secondary | ICD-10-CM

## 2019-03-25 DIAGNOSIS — Z01810 Encounter for preprocedural cardiovascular examination: Secondary | ICD-10-CM

## 2019-03-25 DIAGNOSIS — I2510 Atherosclerotic heart disease of native coronary artery without angina pectoris: Secondary | ICD-10-CM | POA: Diagnosis not present

## 2019-03-25 DIAGNOSIS — R42 Dizziness and giddiness: Secondary | ICD-10-CM | POA: Diagnosis not present

## 2019-03-25 DIAGNOSIS — I10 Essential (primary) hypertension: Secondary | ICD-10-CM

## 2019-03-25 DIAGNOSIS — I771 Stricture of artery: Secondary | ICD-10-CM

## 2019-03-25 LAB — CBC WITH DIFFERENTIAL/PLATELET
Abs Immature Granulocytes: 0.01 10*3/uL (ref 0.00–0.07)
Basophils Absolute: 0 10*3/uL (ref 0.0–0.1)
Basophils Relative: 0 %
Eosinophils Absolute: 0.3 10*3/uL (ref 0.0–0.5)
Eosinophils Relative: 4 %
HCT: 31.6 % — ABNORMAL LOW (ref 39.0–52.0)
Hemoglobin: 9.4 g/dL — ABNORMAL LOW (ref 13.0–17.0)
Immature Granulocytes: 0 %
Lymphocytes Relative: 28 %
Lymphs Abs: 2.1 10*3/uL (ref 0.7–4.0)
MCH: 24.3 pg — ABNORMAL LOW (ref 26.0–34.0)
MCHC: 29.7 g/dL — ABNORMAL LOW (ref 30.0–36.0)
MCV: 81.7 fL (ref 80.0–100.0)
Monocytes Absolute: 0.6 10*3/uL (ref 0.1–1.0)
Monocytes Relative: 8 %
Neutro Abs: 4.6 10*3/uL (ref 1.7–7.7)
Neutrophils Relative %: 60 %
Platelets: 251 10*3/uL (ref 150–400)
RBC: 3.87 MIL/uL — ABNORMAL LOW (ref 4.22–5.81)
RDW: 15.9 % — ABNORMAL HIGH (ref 11.5–15.5)
WBC: 7.7 10*3/uL (ref 4.0–10.5)
nRBC: 0 % (ref 0.0–0.2)

## 2019-03-25 LAB — BASIC METABOLIC PANEL
Anion gap: 8 (ref 5–15)
BUN: 32 mg/dL — ABNORMAL HIGH (ref 8–23)
CO2: 24 mmol/L (ref 22–32)
Calcium: 9 mg/dL (ref 8.9–10.3)
Chloride: 105 mmol/L (ref 98–111)
Creatinine, Ser: 1.41 mg/dL — ABNORMAL HIGH (ref 0.61–1.24)
GFR calc Af Amer: 56 mL/min — ABNORMAL LOW (ref >60)
GFR calc non Af Amer: 48 mL/min — ABNORMAL LOW (ref >60)
Glucose, Bld: 121 mg/dL — ABNORMAL HIGH (ref 70–99)
Potassium: 4.3 mmol/L (ref 3.5–5.1)
Sodium: 137 mmol/L (ref 135–145)

## 2019-03-25 MED ORDER — CLOPIDOGREL BISULFATE 75 MG PO TABS: 75.0000 mg | ORAL_TABLET | Freq: Every day | ORAL | 1 refills | Status: DC

## 2019-03-25 NOTE — Progress Notes (Signed)
  Cardiology Office Note   Date:  03/25/2019   ID:  [name], DOB 07/21/1941, MRN [id number]  PCP:  Cressent, [place], PA-C  Cardiologist:   Tijuan Dantes, MD   Chief Complaint  Patient presents with  . other    2 wk f/u carotid no complaints today. Meds reviewed verbally with pt.      History of Present Illness: Kevin Case is a 77 y.o. male who presents for a follow-up visit after recent hospitalization for acute systolic heart failure.  The patient was previously followed at [facility] cardiology.  He has history of sinus bradycardia, left bundle branch block, essential hypertension and hyperlipidemia. He presented recently to [facility] with worsening shortness of breath and orthopnea.  No chest discomfort.  Initially, code STEMI was activated due to left bundle branch block but it was determined that it was not new.  His previous ejection fraction was normal. An echocardiogram was performed which showed an EF of 30 to 35%, mild to moderate aortic stenosis and mildly dilated left atrium.  The patient was noted to have very diminished pulses in his upper extremities.  Troponin was mildly elevated and peaked at 3.5. He underwent a right and left cardiac catheterization which showed left dominant coronary arteries with heavily calcified vessels and 80% stenosis in the mid left circumflex.  Right heart catheterization showed normal filling pressures, normal pulmonary pressure and normal cardiac output.  Aortic stenosis was mild with a peak gradient of 10 mmHg.  He was found to have 2% heavily calcified proximal left subclavian artery stenosis and heavy calcification in the innominate artery.  There was 60 mm systolic gradient between the right arm and central aortic pressure and 40 mm of systolic gradient between left arm and central pressure.  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 He has been doing reasonably well since hospital discharge but continues to complain of fatigue and having no energy.  No chest pain.  He used to be much more active in the past.  He denies arm claudication.    Past Medical History:  Diagnosis Date  . CHF (congestive heart failure) (HCC)   . Clotting disorder (HCC)   . Diabetes mellitus without complication (HCC)   . Hypertension   . Rib fracture     Past Surgical History:  Procedure Laterality Date  . CARDIAC CATHETERIZATION    . RIGHT/LEFT HEART CATH AND CORONARY ANGIOGRAPHY N/A 03/15/2019   Procedure: RIGHT/LEFT HEART CATH AND CORONARY ANGIOGRAPHY;  Surgeon: Jarion Hawthorne A, MD;  Location: [facility] INVASIVE CV LAB;  Service: Cardiovascular;  Laterality: N/A;     Current Outpatient Medications  Medication Sig Dispense Refill  . aspirin 81 MG chewable tablet Chew 1 tablet (81 mg total) by mouth daily. 30 tablet 0  . atorvastatin (LIPITOR) 80 MG tablet Take 1 tablet by mouth at bedtime.    . cyanocobalamin 100 MCG tablet Take 100 mcg by mouth daily.    . docusate sodium (COLACE) 100 MG capsule Take 100 mg by mouth daily as needed for mild constipation.    . furosemide (LASIX) 20 MG tablet Take 1 tablet by mouth daily.    . JARDIANCE 10 MG TABS tablet Take 10 mg by mouth daily.    . losartan (COZAAR) 25 MG tablet Take 1 tablet (25 mg total) by mouth daily. 30 tablet   0  . Melatonin 10 MG TABS Take 10 mg by mouth.    . Multiple Vitamin (MULTIVITAMIN) tablet Take 1 tablet by mouth daily.    . Omega-3 Fatty Acids (FISH OIL OMEGA-3 PO) Take 1,000 mg by mouth daily.     Marland Kitchen omeprazole (PRILOSEC) 40 MG capsule Take 40 mg by mouth daily.    . traMADol (ULTRAM) 50 MG tablet Take 1 tablet (50 mg total) by mouth every 6 (six) hours as needed. 8 tablet 0   No current facility-administered medications for this visit.     Allergies:   Patient has no known allergies.    Social History:  The patient   reports that he has quit smoking. He has never used smokeless tobacco. He reports that he does not drink alcohol or use drugs.   Family History:  The patient's family history includes Congestive Heart Failure in his father.    ROS:  Please see the history of present illness.   Otherwise, review of systems are positive for none.   All other systems are reviewed and negative.    PHYSICAL EXAM: VS:  BP (!) 119/44 (BP Location: Left Arm, Patient Position: Sitting, Cuff Size: Normal)   Ht 5\' 9"  (1.753 m)   Wt 157 lb (71.2 kg)   BMI 23.18 kg/m  , BMI Body mass index is 23.18 kg/m. GEN: Well nourished, well developed, in no acute distress  HEENT: normal  Neck: no JVD, bilateral carotid bruits, or masses.  Bilateral bruits in the subclavian area Cardiac: RRR; no  rubs, or gallops,no edema .  2 out of 6 systolic murmur in the aortic area Respiratory:  clear to auscultation bilaterally, normal work of breathing GI: soft, nontender, nondistended, + BS MS: no deformity or atrophy  Skin: warm and dry, no rash Neuro:  Strength and sensation are intact Psych: euthymic mood, full affect.  Right groin: Normal femoral pulse with no hematoma   EKG:  EKG is ordered today. The ekg ordered today demonstrates sinus bradycardia with left bundle branch block.  Heart rate is 50 bpm.   Recent Labs: 03/12/2019: B Natriuretic Peptide 864.0 03/13/2019: ALT 30 03/15/2019: BUN 27; Creatinine, Ser 1.35; Hemoglobin 8.6; Platelets 164; Potassium 3.9; Sodium 143    Lipid Panel No results found for: CHOL, TRIG, HDL, CHOLHDL, VLDL, LDLCALC, LDLDIRECT    Wt Readings from Last 3 Encounters:  03/25/19 157 lb (71.2 kg)  03/22/19 154 lb (69.9 kg)  03/16/19 155 lb (70.3 kg)       PAD Screen 03/25/2019  Previous PAD dx? No  Previous surgical procedure? No  Pain with walking? Yes  Subsides with rest? Yes  Feet/toe relief with dangling? No  Painful, non-healing ulcers? No  Extremities discolored? No       ASSESSMENT AND PLAN:  1.  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  Degree of cardiomyopathy is out of proportion to coronary artery disease.  Up titration of heart failure medications is extremely difficult given an accurate blood pressure readings in both arms.  Also, and a drop in systolic blood pressure might lead to cerebral hypoperfusion given bilateral disease affecting innominate artery and left subclavian artery.  He does not appear to be volume overloaded on small dose furosemide which will be continued.  Continue current dose of losartan.  He is not on a beta-blocker due to chronic bradycardia.  He might ultimately benefit from CRT if low EF persists.  2.  Coronary artery disease: Recent non-ST elevation myocardial infarction.  Cardiac catheterization showed 80% stenosis in the mid left circumflex which was heavily calcified.  His presentation was felt to be more due to heart failure than coronary artery disease.  Currently has no convincing symptoms of angina.  Left circumflex PCI will be reserved for anginal symptoms and if it needs to be done, it will require atherectomy.  3.  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  This is problematic in terms of being able to uptitrate his heart failure medications as his systolic blood pressure is difficult to monitor noninvasively.  Also the patient continues to have significant fatigue and any further drop in blood pressure might decrease cerebral perfusion.  I think the best option is to try to at least revascularize either of the innominate artery or the left subclavian artery in order to improve cerebral hypoperfusion and also have a way of noninvasively measuring his blood pressure.  This is needed on urgent basis in order to address his cardiac status.  I discussed the procedure in details as well  as risks and benefits and he is agreeable to proceed with aortic arch angiography with possible endovascular intervention.  The procedure is going to be somewhat difficult due to heavy calcifications. Going to start the patient on Plavix 75 mg once daily.  3.  Essential hypertension: Blood pressure measurement is not accurate due to the above.  Central aortic pressure was significantly higher.  4.  Hyperlipidemia: Continue high-dose atorvastatin with a target LDL of less than 70.    Disposition:   FU with me in 1 month  Signed,  Lorine Bears, MD  03/25/2019 10:36 AM    [facility]

## 2019-03-25 NOTE — H&P (View-Only) (Signed)
Cardiology Office Note   Date:  03/25/2019   ID:  Kevin Case, DOB [birth year], MRN [id number]  PCP:  Ebbie Ridge, PA-C  Cardiologist:   Lorine Bears, MD   Chief Complaint  Patient presents with  . other    2 wk f/u carotid no complaints today. Meds reviewed verbally with pt.      History of Present Illness: Kevin Case is a 78 y.o. male who presents for a follow-up visit after recent hospitalization for acute systolic heart failure.  The patient was previously followed at [facility] cardiology.  He has history of sinus bradycardia, left bundle branch block, essential hypertension and hyperlipidemia. He presented recently to [facility] with worsening shortness of breath and orthopnea.  No chest discomfort.  Initially, code STEMI was activated due to left bundle branch block but it was determined that it was not new.  His previous ejection fraction was normal. An echocardiogram was performed which showed an EF of 30 to 35%, mild to moderate aortic stenosis and mildly dilated left atrium.  The patient was noted to have very diminished pulses in his upper extremities.  Troponin was mildly elevated and peaked at 3.5. He underwent a right and left cardiac catheterization which showed left dominant coronary arteries with heavily calcified vessels and 80% stenosis in the mid left circumflex.  Right heart catheterization showed normal filling pressures, normal pulmonary pressure and normal cardiac output.  Aortic stenosis was mild with a peak gradient of 10 mmHg.  He was found to have 2% heavily calcified proximal left subclavian artery stenosis and heavy calcification in the innominate artery.  There was 60 mm systolic gradient between the right arm and central aortic pressure and 40 mm of systolic gradient between left arm and central pressure.  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 He has been doing reasonably well since hospital discharge but continues to complain of fatigue and having no energy.  No chest pain.  He used to be much more active in the past.  He denies arm claudication.    Past Medical History:  Diagnosis Date  . CHF (congestive heart failure) (HCC)   . Clotting disorder (HCC)   . Diabetes mellitus without complication (HCC)   . Hypertension   . Rib fracture     Past Surgical History:  Procedure Laterality Date  . CARDIAC CATHETERIZATION    . RIGHT/LEFT HEART CATH AND CORONARY ANGIOGRAPHY N/A 03/15/2019   Procedure: RIGHT/LEFT HEART CATH AND CORONARY ANGIOGRAPHY;  Surgeon: Iran Ouch, MD;  Location: [facility] INVASIVE CV LAB;  Service: Cardiovascular;  Laterality: N/A;     Current Outpatient Medications  Medication Sig Dispense Refill  . aspirin 81 MG chewable tablet Chew 1 tablet (81 mg total) by mouth daily. 30 tablet 0  . atorvastatin (LIPITOR) 80 MG tablet Take 1 tablet by mouth at bedtime.    . cyanocobalamin 100 MCG tablet Take 100 mcg by mouth daily.    Marland Kitchen docusate sodium (COLACE) 100 MG capsule Take 100 mg by mouth daily as needed for mild constipation.    . furosemide (LASIX) 20 MG tablet Take 1 tablet by mouth daily.    Marland Kitchen JARDIANCE 10 MG TABS tablet Take 10 mg by mouth daily.    Marland Kitchen losartan (COZAAR) 25 MG tablet Take 1 tablet (25 mg total) by mouth daily. 30 tablet  0  . Melatonin 10 MG TABS Take 10 mg by mouth.    . Multiple Vitamin (MULTIVITAMIN) tablet Take 1 tablet by mouth daily.    . Omega-3 Fatty Acids (FISH OIL OMEGA-3 PO) Take 1,000 mg by mouth daily.     Marland Kitchen omeprazole (PRILOSEC) 40 MG capsule Take 40 mg by mouth daily.    . traMADol (ULTRAM) 50 MG tablet Take 1 tablet (50 mg total) by mouth every 6 (six) hours as needed. 8 tablet 0   No current facility-administered medications for this visit.     Allergies:   Patient has no known allergies.    Social History:  The patient   reports that he has quit smoking. He has never used smokeless tobacco. He reports that he does not drink alcohol or use drugs.   Family History:  The patient's family history includes Congestive Heart Failure in his father.    ROS:  Please see the history of present illness.   Otherwise, review of systems are positive for none.   All other systems are reviewed and negative.    PHYSICAL EXAM: VS:  BP (!) 119/44 (BP Location: Left Arm, Patient Position: Sitting, Cuff Size: Normal)   Ht 5\' 9"  (1.753 m)   Wt 157 lb (71.2 kg)   BMI 23.18 kg/m  , BMI Body mass index is 23.18 kg/m. GEN: Well nourished, well developed, in no acute distress  HEENT: normal  Neck: no JVD, bilateral carotid bruits, or masses.  Bilateral bruits in the subclavian area Cardiac: RRR; no  rubs, or gallops,no edema .  2 out of 6 systolic murmur in the aortic area Respiratory:  clear to auscultation bilaterally, normal work of breathing GI: soft, nontender, nondistended, + BS MS: no deformity or atrophy  Skin: warm and dry, no rash Neuro:  Strength and sensation are intact Psych: euthymic mood, full affect.  Right groin: Normal femoral pulse with no hematoma   EKG:  EKG is ordered today. The ekg ordered today demonstrates sinus bradycardia with left bundle branch block.  Heart rate is 50 bpm.   Recent Labs: 03/12/2019: B Natriuretic Peptide 864.0 03/13/2019: ALT 30 03/15/2019: BUN 27; Creatinine, Ser 1.35; Hemoglobin 8.6; Platelets 164; Potassium 3.9; Sodium 143    Lipid Panel No results found for: CHOL, TRIG, HDL, CHOLHDL, VLDL, LDLCALC, LDLDIRECT    Wt Readings from Last 3 Encounters:  03/25/19 157 lb (71.2 kg)  03/22/19 154 lb (69.9 kg)  03/16/19 155 lb (70.3 kg)       PAD Screen 03/25/2019  Previous PAD dx? No  Previous surgical procedure? No  Pain with walking? Yes  Subsides with rest? Yes  Feet/toe relief with dangling? No  Painful, non-healing ulcers? No  Extremities discolored? No       ASSESSMENT AND PLAN:  1.  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  Degree of cardiomyopathy is out of proportion to coronary artery disease.  Up titration of heart failure medications is extremely difficult given an accurate blood pressure readings in both arms.  Also, and a drop in systolic blood pressure might lead to cerebral hypoperfusion given bilateral disease affecting innominate artery and left subclavian artery.  He does not appear to be volume overloaded on small dose furosemide which will be continued.  Continue current dose of losartan.  He is not on a beta-blocker due to chronic bradycardia.  He might ultimately benefit from CRT if low EF persists.  2.  Coronary artery disease: Recent non-ST elevation myocardial infarction.  Cardiac catheterization showed 80% stenosis in the mid left circumflex which was heavily calcified.  His presentation was felt to be more due to heart failure than coronary artery disease.  Currently has no convincing symptoms of angina.  Left circumflex PCI will be reserved for anginal symptoms and if it needs to be done, it will require atherectomy.  3.  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  This is problematic in terms of being able to uptitrate his heart failure medications as his systolic blood pressure is difficult to monitor noninvasively.  Also the patient continues to have significant fatigue and any further drop in blood pressure might decrease cerebral perfusion.  I think the best option is to try to at least revascularize either of the innominate artery or the left subclavian artery in order to improve cerebral hypoperfusion and also have a way of noninvasively measuring his blood pressure.  This is needed on urgent basis in order to address his cardiac status.  I discussed the procedure in details as well  as risks and benefits and he is agreeable to proceed with aortic arch angiography with possible endovascular intervention.  The procedure is going to be somewhat difficult due to heavy calcifications. Going to start the patient on Plavix 75 mg once daily.  3.  Essential hypertension: Blood pressure measurement is not accurate due to the above.  Central aortic pressure was significantly higher.  4.  Hyperlipidemia: Continue high-dose atorvastatin with a target LDL of less than 70.    Disposition:   FU with me in 1 month  Signed,  Lorine Bears, MD  03/25/2019 10:36 AM    [facility]

## 2019-03-25 NOTE — Patient Instructions (Addendum)
Medication Instructions:  Your physician has recommended you make the following change in your medication:  1- START Plavix 75 mg by mouth once a day.  If you need a refill on your cardiac medications before your next appointment, please call your pharmacy.   Lab work: 1- Your physician recommends that you return for lab work in: TODAY  (CBC, BMP) AT THE MEDICAL MALL. - Please go to the [facility] You will check in at the front desk to the right as you walk into the atrium. Valet Parking is offered if needed.  2- DRIVE UP COVID TESTING AT THE [facility] AWNING TODAY-  They will give you more detailed instructions concerning the process. You will need to be prepared to be self-quarantined at home from the time you have the test to your procedure day.  If you have labs (blood work) drawn today and your tests are completely normal, you will receive your results only by:  MyChart Message (if you have MyChart) OR  A paper copy in the mail If you have any lab test that is abnormal or we need to change your treatment, we will call you to review the results.  Testing/Procedures:    [facility] CARDIOVASCULAR DIVISION [facility] [address] [place] [address] Dept: [phone] Loc: [phone]  Velton Bou  03/25/2019  You are scheduled for a Peripheral Angiogram on Wednesday, May 27 with Dr. Lorine Bears.  1. Please arrive at the [facility] (Main Entrance A) at [facility]: [address] [place], [place] [address] at 6:30 AM (This time is two hours before your procedure to ensure your preparation). Free valet parking service is available.   Special note: Every effort is made to have your procedure done on time. Please understand that emergencies sometimes delay scheduled procedures.  2. Diet: Do not eat solid foods after midnight.  The patient may have clear liquids until 5am upon the day  of the procedure.  3. Labs: You will need to have blood drawn on Thursday, May 21 at [facility] Entrance, Go to 1st desk on your right to register.  Address: [address] [place], [place] [address]  Open: 8am - 5pm  Phone: [phone]. You do not need to be fasting.  4. Medication instructions in preparation for your procedure:   Contrast Allergy: No  Stop taking, Lasix (Furosemide)  Wednesday, May 27, (day of the procedure).   Do not take Diabetes Med Jardiance on the day of the procedure and HOLD 48 HOURS AFTER THE PROCEDURE.  On the morning of your procedure, take your Plavix/Clopidogrel and aspirin and any morning medicines NOT listed above.  You may use sips of water.  5. Plan for one night stay--bring personal belongings. 6. Bring a current list of your medications and current insurance cards. 7. You MUST have a responsible person to drive you home. 8. Someone MUST be with you the first 24 hours after you arrive home or your discharge will be delayed. 9. Please wear clothes that are easy to get on and off and wear slip-on shoes.  Thank you for allowing [place] to care for you!   -- [facility]    Follow-Up: At [facility], you and your health needs are our priority.  As part of our continuing mission to provide you with exceptional heart care, we have created designated Provider Care Teams.  These Care Teams include your  primary Cardiologist (physician) and Advanced Practice Providers (APPs -  Physician Assistants and Nurse Practitioners) who all work together to provide you with the care you need, when you need it. You will need a follow up appointment in to be determined post procedure.  You may see Dr Kirke Corin or one of the following Advanced Practice Providers on your designated Care Team:   Nicolasa Ducking, NP Eula Listen, PA-C  [place]isue Ivan, [place]

## 2019-03-26 LAB — NOVEL CORONAVIRUS, NAA (HOSP ORDER, SEND-OUT TO REF LAB; TAT 18-24 HRS): SARS-CoV-2, NAA: NOT DETECTED

## 2019-03-31 ENCOUNTER — Encounter (HOSPITAL_COMMUNITY)
Admission: RE | Disposition: A | Discharge status: Home / Self Care | Payer: Self-pay | Source: Home / Self Care | Attending: Cardiovascular Disease

## 2019-03-31 ENCOUNTER — Ambulatory Visit (HOSPITAL_COMMUNITY): Payer: Medicare Other

## 2019-03-31 ENCOUNTER — Inpatient Hospital Stay (HOSPITAL_COMMUNITY)
Admission: RE | Admit: 2019-03-31 | Discharge: 2019-04-02 | DRG: 253 | Disposition: A | Discharge status: Home / Self Care | Payer: Medicare Other | Attending: Cardiology | Admitting: Cardiology

## 2019-03-31 ENCOUNTER — Encounter (HOSPITAL_COMMUNITY): Payer: Self-pay | Admitting: Cardiovascular Disease

## 2019-03-31 DIAGNOSIS — Z7984 Long term (current) use of oral hypoglycemic drugs: Secondary | ICD-10-CM

## 2019-03-31 DIAGNOSIS — N183 Chronic kidney disease, stage 3 (moderate): Secondary | ICD-10-CM | POA: Diagnosis present

## 2019-03-31 DIAGNOSIS — I5021 Acute systolic (congestive) heart failure: Secondary | ICD-10-CM | POA: Diagnosis present

## 2019-03-31 DIAGNOSIS — E8770 Fluid overload, unspecified: Secondary | ICD-10-CM

## 2019-03-31 DIAGNOSIS — I70208 Unspecified atherosclerosis of native arteries of extremities, other extremity: Secondary | ICD-10-CM | POA: Diagnosis present

## 2019-03-31 DIAGNOSIS — Z87891 Personal history of nicotine dependence: Secondary | ICD-10-CM

## 2019-03-31 DIAGNOSIS — Z7982 Long term (current) use of aspirin: Secondary | ICD-10-CM

## 2019-03-31 DIAGNOSIS — Z79899 Other long term (current) drug therapy: Secondary | ICD-10-CM

## 2019-03-31 DIAGNOSIS — E1122 Type 2 diabetes mellitus with diabetic chronic kidney disease: Secondary | ICD-10-CM | POA: Diagnosis present

## 2019-03-31 DIAGNOSIS — I350 Nonrheumatic aortic (valve) stenosis: Secondary | ICD-10-CM | POA: Diagnosis present

## 2019-03-31 DIAGNOSIS — E1151 Type 2 diabetes mellitus with diabetic peripheral angiopathy without gangrene: Secondary | ICD-10-CM | POA: Diagnosis present

## 2019-03-31 DIAGNOSIS — I5022 Chronic systolic (congestive) heart failure: Secondary | ICD-10-CM | POA: Diagnosis present

## 2019-03-31 DIAGNOSIS — Z79891 Long term (current) use of opiate analgesic: Secondary | ICD-10-CM

## 2019-03-31 DIAGNOSIS — I2510 Atherosclerotic heart disease of native coronary artery without angina pectoris: Secondary | ICD-10-CM | POA: Diagnosis present

## 2019-03-31 DIAGNOSIS — I130 Hypertensive heart and chronic kidney disease with heart failure and stage 1 through stage 4 chronic kidney disease, or unspecified chronic kidney disease: Secondary | ICD-10-CM | POA: Diagnosis present

## 2019-03-31 DIAGNOSIS — I959 Hypotension, unspecified: Secondary | ICD-10-CM

## 2019-03-31 DIAGNOSIS — R001 Bradycardia, unspecified: Secondary | ICD-10-CM | POA: Diagnosis present

## 2019-03-31 DIAGNOSIS — Z8249 Family history of ischemic heart disease and other diseases of the circulatory system: Secondary | ICD-10-CM

## 2019-03-31 DIAGNOSIS — I708 Atherosclerosis of other arteries: Principal | ICD-10-CM | POA: Diagnosis present

## 2019-03-31 DIAGNOSIS — K922 Gastrointestinal hemorrhage, unspecified: Secondary | ICD-10-CM | POA: Diagnosis present

## 2019-03-31 DIAGNOSIS — I252 Old myocardial infarction: Secondary | ICD-10-CM

## 2019-03-31 DIAGNOSIS — I447 Left bundle-branch block, unspecified: Secondary | ICD-10-CM | POA: Diagnosis present

## 2019-03-31 DIAGNOSIS — E785 Hyperlipidemia, unspecified: Secondary | ICD-10-CM | POA: Diagnosis present

## 2019-03-31 DIAGNOSIS — Z8711 Personal history of peptic ulcer disease: Secondary | ICD-10-CM

## 2019-03-31 DIAGNOSIS — I429 Cardiomyopathy, unspecified: Secondary | ICD-10-CM | POA: Diagnosis present

## 2019-03-31 DIAGNOSIS — I771 Stricture of artery: Secondary | ICD-10-CM | POA: Diagnosis present

## 2019-03-31 HISTORY — DX: Anemia, unspecified: D64.9

## 2019-03-31 HISTORY — PX: PERIPHERAL VASCULAR INTERVENTION: CATH118257

## 2019-03-31 HISTORY — DX: Stricture of artery: I77.1

## 2019-03-31 HISTORY — PX: AORTIC ARCH ANGIOGRAPHY: CATH118224

## 2019-03-31 LAB — CBC
HCT: 27.7 % — ABNORMAL LOW (ref 39.0–52.0)
HCT: 21.6 % — ABNORMAL LOW (ref 39.0–52.0)
HCT: 29.8 % — ABNORMAL LOW (ref 39.0–52.0)
Hemoglobin: 8.3 g/dL — ABNORMAL LOW (ref 13.0–17.0)
Hemoglobin: 6.5 g/dL — CL (ref 13.0–17.0)
Hemoglobin: 9.3 g/dL — ABNORMAL LOW (ref 13.0–17.0)
MCH: 24 pg — ABNORMAL LOW (ref 26.0–34.0)
MCH: 24.2 pg — ABNORMAL LOW (ref 26.0–34.0)
MCH: 24.7 pg — ABNORMAL LOW (ref 26.0–34.0)
MCHC: 30 g/dL (ref 30.0–36.0)
MCHC: 30.1 g/dL (ref 30.0–36.0)
MCHC: 31.2 g/dL (ref 30.0–36.0)
MCV: 80.1 fL (ref 80.0–100.0)
MCV: 80.3 fL (ref 80.0–100.0)
MCV: 79 fL — ABNORMAL LOW (ref 80.0–100.0)
Platelets: 227 10*3/uL (ref 150–400)
Platelets: 187 10*3/uL (ref 150–400)
Platelets: 180 10*3/uL (ref 150–400)
RBC: 3.46 MIL/uL — ABNORMAL LOW (ref 4.22–5.81)
RBC: 2.69 MIL/uL — ABNORMAL LOW (ref 4.22–5.81)
RBC: 3.77 MIL/uL — ABNORMAL LOW (ref 4.22–5.81)
RDW: 15.9 % — ABNORMAL HIGH (ref 11.5–15.5)
RDW: 15.9 % — ABNORMAL HIGH (ref 11.5–15.5)
RDW: 15.6 % — ABNORMAL HIGH (ref 11.5–15.5)
WBC: 12.7 10*3/uL — ABNORMAL HIGH (ref 4.0–10.5)
WBC: 11.4 10*3/uL — ABNORMAL HIGH (ref 4.0–10.5)
WBC: 11.8 10*3/uL — ABNORMAL HIGH (ref 4.0–10.5)
nRBC: 0 % (ref 0.0–0.2)
nRBC: 0 % (ref 0.0–0.2)
nRBC: 0 % (ref 0.0–0.2)

## 2019-03-31 LAB — GLUCOSE, CAPILLARY
Glucose-Capillary: 153 mg/dL — ABNORMAL HIGH (ref 70–99)
Glucose-Capillary: 118 mg/dL — ABNORMAL HIGH (ref 70–99)

## 2019-03-31 LAB — BASIC METABOLIC PANEL
Anion gap: 9 (ref 5–15)
BUN: 31 mg/dL — ABNORMAL HIGH (ref 8–23)
CO2: 20 mmol/L — ABNORMAL LOW (ref 22–32)
Calcium: 8.6 mg/dL — ABNORMAL LOW (ref 8.9–10.3)
Chloride: 110 mmol/L (ref 98–111)
Creatinine, Ser: 1.38 mg/dL — ABNORMAL HIGH (ref 0.61–1.24)
GFR calc Af Amer: 58 mL/min — ABNORMAL LOW (ref >60)
GFR calc non Af Amer: 50 mL/min — ABNORMAL LOW (ref >60)
Glucose, Bld: 141 mg/dL — ABNORMAL HIGH (ref 70–99)
Potassium: 4.8 mmol/L (ref 3.5–5.1)
Sodium: 139 mmol/L (ref 135–145)

## 2019-03-31 LAB — PREPARE RBC (CROSSMATCH)

## 2019-03-31 LAB — ABO/RH: ABO/RH(D): A POS

## 2019-03-31 LAB — POCT ACTIVATED CLOTTING TIME
Activated Clotting Time: 158 seconds
Activated Clotting Time: 175 seconds
Activated Clotting Time: 175 seconds
Activated Clotting Time: 395 seconds

## 2019-03-31 SURGERY — AORTIC ARCH ANGIOGRAPHY
Anesthesia: LOCAL

## 2019-03-31 MED ORDER — ASPIRIN 81 MG PO CHEW
81.0000 mg | CHEWABLE_TABLET | Freq: Every day | ORAL | Status: DC
  Administered 2019-04-01 – 2019-04-02 (×2): 81 mg via ORAL
  Filled 2019-03-31 (×2): qty 1

## 2019-03-31 MED ORDER — HEPARIN (PORCINE) IN NACL 1000-0.9 UT/500ML-% IV SOLN
INTRAVENOUS | Status: AC
  Filled 2019-03-31: qty 1000

## 2019-03-31 MED ORDER — SODIUM CHLORIDE 0.9 % IV SOLN: 250.0000 mL | INTRAVENOUS | Status: DC | PRN

## 2019-03-31 MED ORDER — HEPARIN SODIUM (PORCINE) 1000 UNIT/ML IJ SOLN
INTRAMUSCULAR | Status: AC
  Filled 2019-03-31: qty 1

## 2019-03-31 MED ORDER — FISH OIL 1000 MG PO CAPS: 1000.0000 mg | ORAL_CAPSULE | Freq: Every day | ORAL | Status: DC

## 2019-03-31 MED ORDER — LIDOCAINE HCL (PF) 1 % IJ SOLN
INTRAMUSCULAR | Status: DC | PRN
  Administered 2019-03-31: 15 mL

## 2019-03-31 MED ORDER — HEPARIN (PORCINE) IN NACL 1000-0.9 UT/500ML-% IV SOLN
INTRAVENOUS | Status: DC | PRN
  Administered 2019-03-31 (×2): 500 mL

## 2019-03-31 MED ORDER — BIVALIRUDIN TRIFLUOROACETATE 250 MG IV SOLR
INTRAVENOUS | Status: AC
  Filled 2019-03-31: qty 250

## 2019-03-31 MED ORDER — ASPIRIN 81 MG PO CHEW: 81.0000 mg | CHEWABLE_TABLET | ORAL | Status: DC

## 2019-03-31 MED ORDER — CANAGLIFLOZIN 100 MG PO TABS
100.0000 mg | ORAL_TABLET | Freq: Every day | ORAL | Status: DC
  Administered 2019-04-01 – 2019-04-02 (×2): 100 mg via ORAL
  Filled 2019-03-31 (×2): qty 1

## 2019-03-31 MED ORDER — FENTANYL CITRATE (PF) 100 MCG/2ML IJ SOLN
INTRAMUSCULAR | Status: DC | PRN
  Administered 2019-03-31 (×2): 25 ug via INTRAVENOUS

## 2019-03-31 MED ORDER — PANTOPRAZOLE SODIUM 40 MG PO TBEC: 40.0000 mg | DELAYED_RELEASE_TABLET | Freq: Every day | ORAL | Status: DC

## 2019-03-31 MED ORDER — SODIUM CHLORIDE 0.9 % IV SOLN
8.0000 mg/h | INTRAVENOUS | Status: DC
  Administered 2019-03-31: 8 mg/h via INTRAVENOUS
  Filled 2019-03-31 (×3): qty 80

## 2019-03-31 MED ORDER — SODIUM CHLORIDE 0.9% FLUSH: 3.0000 mL | Freq: Two times a day (BID) | INTRAVENOUS | Status: DC

## 2019-03-31 MED ORDER — SODIUM CHLORIDE 0.9% FLUSH
3.0000 mL | Freq: Two times a day (BID) | INTRAVENOUS | Status: DC
  Administered 2019-03-31 – 2019-04-02 (×4): 3 mL via INTRAVENOUS

## 2019-03-31 MED ORDER — HYDRALAZINE HCL 20 MG/ML IJ SOLN: 5.0000 mg | INTRAMUSCULAR | Status: DC | PRN

## 2019-03-31 MED ORDER — HYDRALAZINE HCL 20 MG/ML IJ SOLN
INTRAMUSCULAR | Status: DC | PRN
  Administered 2019-03-31: 10 mg via INTRAVENOUS

## 2019-03-31 MED ORDER — ACETAMINOPHEN 325 MG PO TABS: 650.0000 mg | ORAL_TABLET | ORAL | Status: DC | PRN

## 2019-03-31 MED ORDER — BIVALIRUDIN BOLUS VIA INFUSION - CUPID
INTRAVENOUS | Status: DC | PRN
  Administered 2019-03-31: 09:00:00 51 mg via INTRAVENOUS

## 2019-03-31 MED ORDER — ONDANSETRON HCL 4 MG/2ML IJ SOLN
4.0000 mg | Freq: Four times a day (QID) | INTRAMUSCULAR | Status: DC | PRN
  Administered 2019-03-31 – 2019-04-02 (×3): 4 mg via INTRAVENOUS
  Filled 2019-03-31 (×3): qty 2

## 2019-03-31 MED ORDER — SODIUM CHLORIDE 0.9% FLUSH: 3.0000 mL | INTRAVENOUS | Status: DC | PRN

## 2019-03-31 MED ORDER — SODIUM CHLORIDE 0.9 % IV SOLN
80.0000 mg | Freq: Once | INTRAVENOUS | Status: AC
  Administered 2019-03-31: 80 mg via INTRAVENOUS
  Filled 2019-03-31 (×2): qty 80

## 2019-03-31 MED ORDER — SODIUM CHLORIDE 0.9% IV SOLUTION
Freq: Once | INTRAVENOUS | Status: AC
  Administered 2019-03-31: 17:00:00 via INTRAVENOUS

## 2019-03-31 MED ORDER — PANTOPRAZOLE SODIUM 40 MG IV SOLR: 40.0000 mg | Freq: Two times a day (BID) | INTRAVENOUS | Status: DC

## 2019-03-31 MED ORDER — NOREPINEPHRINE 4 MG/250ML-% IV SOLN
2.0000 ug/min | INTRAVENOUS | Status: DC
  Administered 2019-03-31: 22:00:00 2 ug/min via INTRAVENOUS

## 2019-03-31 MED ORDER — SODIUM CHLORIDE 0.9 % IV SOLN
INTRAVENOUS | Status: AC
  Administered 2019-03-31: 12:00:00 via INTRAVENOUS

## 2019-03-31 MED ORDER — SODIUM CHLORIDE 0.9 % IV SOLN: 250.0000 mL | INTRAVENOUS | Status: DC

## 2019-03-31 MED ORDER — DOCUSATE SODIUM 100 MG PO CAPS: 100.0000 mg | ORAL_CAPSULE | Freq: Every day | ORAL | Status: DC

## 2019-03-31 MED ORDER — LIDOCAINE HCL (PF) 1 % IJ SOLN
INTRAMUSCULAR | Status: AC
  Filled 2019-03-31: qty 30

## 2019-03-31 MED ORDER — LOSARTAN POTASSIUM 25 MG PO TABS
25.0000 mg | ORAL_TABLET | Freq: Every day | ORAL | Status: DC
  Administered 2019-04-01: 09:00:00 25 mg via ORAL
  Filled 2019-03-31: qty 1

## 2019-03-31 MED ORDER — MIDAZOLAM HCL 2 MG/2ML IJ SOLN
INTRAMUSCULAR | Status: DC | PRN
  Administered 2019-03-31: 1 mg via INTRAVENOUS

## 2019-03-31 MED ORDER — SODIUM CHLORIDE 0.9 % IV BOLUS
500.0000 mL | Freq: Once | INTRAVENOUS | Status: AC
  Administered 2019-03-31: 22:00:00 500 mL via INTRAVENOUS

## 2019-03-31 MED ORDER — ADULT MULTIVITAMIN W/MINERALS CH
1.0000 | ORAL_TABLET | Freq: Every day | ORAL | Status: DC
  Administered 2019-04-01 – 2019-04-02 (×2): 1 via ORAL
  Filled 2019-03-31 (×2): qty 1

## 2019-03-31 MED ORDER — MELATONIN 3 MG PO TABS
9.0000 mg | ORAL_TABLET | Freq: Every day | ORAL | Status: DC
  Administered 2019-04-02: 9 mg via ORAL
  Filled 2019-03-31 (×3): qty 3

## 2019-03-31 MED ORDER — HEPARIN SODIUM (PORCINE) 1000 UNIT/ML IJ SOLN
INTRAMUSCULAR | Status: DC | PRN
  Administered 2019-03-31: 7000 [IU] via INTRAVENOUS

## 2019-03-31 MED ORDER — OMEGA-3-ACID ETHYL ESTERS 1 G PO CAPS
1.0000 g | ORAL_CAPSULE | Freq: Every day | ORAL | Status: DC
  Administered 2019-04-01 – 2019-04-02 (×2): 1 g via ORAL
  Filled 2019-03-31 (×2): qty 1

## 2019-03-31 MED ORDER — SODIUM CHLORIDE 0.9 % IV BOLUS
250.0000 mL | Freq: Once | INTRAVENOUS | Status: AC
  Administered 2019-03-31: 16:00:00 250 mL via INTRAVENOUS

## 2019-03-31 MED ORDER — SODIUM CHLORIDE 0.9 % IV SOLN
INTRAVENOUS | Status: DC | PRN
  Administered 2019-03-31: 09:00:00 1.75 mg/kg/h via INTRAVENOUS

## 2019-03-31 MED ORDER — IODIXANOL 320 MG/ML IV SOLN
INTRAVENOUS | Status: DC | PRN
  Administered 2019-03-31: 10:00:00 155 mL via INTRAVENOUS

## 2019-03-31 MED ORDER — NOREPINEPHRINE 4 MG/250ML-% IV SOLN
INTRAVENOUS | Status: AC
  Filled 2019-03-31: qty 250

## 2019-03-31 MED ORDER — FENTANYL CITRATE (PF) 100 MCG/2ML IJ SOLN
INTRAMUSCULAR | Status: AC
  Filled 2019-03-31: qty 2

## 2019-03-31 MED ORDER — VITAMIN B-12 100 MCG PO TABS
100.0000 ug | ORAL_TABLET | Freq: Every day | ORAL | Status: DC
  Administered 2019-04-01 – 2019-04-02 (×2): 100 ug via ORAL
  Filled 2019-03-31 (×2): qty 1

## 2019-03-31 MED ORDER — NOREPINEPHRINE BITARTRATE 1 MG/ML IV SOLN
INTRAVENOUS | Status: DC | PRN
  Administered 2019-03-31: 10:00:00 10 ug/min via INTRAVENOUS

## 2019-03-31 MED ORDER — ATORVASTATIN CALCIUM 80 MG PO TABS
80.0000 mg | ORAL_TABLET | Freq: Every day | ORAL | Status: DC
  Administered 2019-03-31 – 2019-04-01 (×2): 80 mg via ORAL
  Filled 2019-03-31 (×2): qty 1

## 2019-03-31 MED ORDER — SODIUM CHLORIDE 0.9 % IV SOLN
INTRAVENOUS | Status: DC
  Administered 2019-03-31: 07:00:00 via INTRAVENOUS

## 2019-03-31 MED ORDER — FUROSEMIDE 20 MG PO TABS
20.0000 mg | ORAL_TABLET | Freq: Every day | ORAL | Status: DC
  Administered 2019-04-01 – 2019-04-02 (×2): 20 mg via ORAL
  Filled 2019-03-31 (×2): qty 1

## 2019-03-31 MED ORDER — DOCUSATE SODIUM 100 MG PO CAPS
100.0000 mg | ORAL_CAPSULE | Freq: Every day | ORAL | Status: DC
  Administered 2019-04-01: 100 mg via ORAL
  Filled 2019-03-31: qty 1

## 2019-03-31 MED ORDER — CLOPIDOGREL BISULFATE 75 MG PO TABS
75.0000 mg | ORAL_TABLET | Freq: Every day | ORAL | Status: DC
  Administered 2019-04-01 – 2019-04-02 (×2): 75 mg via ORAL
  Filled 2019-03-31 (×2): qty 1

## 2019-03-31 MED ORDER — TRAMADOL HCL 50 MG PO TABS: 50.0000 mg | ORAL_TABLET | Freq: Four times a day (QID) | ORAL | Status: DC | PRN

## 2019-03-31 MED ORDER — FUROSEMIDE 20 MG PO TABS: 20.0000 mg | ORAL_TABLET | Freq: Once | ORAL | Status: DC

## 2019-03-31 MED ORDER — SODIUM CHLORIDE 0.9 % IV BOLUS: 250.0000 mL | Freq: Once | INTRAVENOUS | Status: DC

## 2019-03-31 MED ORDER — FUROSEMIDE 10 MG/ML IJ SOLN
20.0000 mg | Freq: Once | INTRAMUSCULAR | Status: AC
  Administered 2019-03-31: 19:00:00 20 mg via INTRAVENOUS
  Filled 2019-03-31: qty 2

## 2019-03-31 MED ORDER — MIDAZOLAM HCL 2 MG/2ML IJ SOLN
INTRAMUSCULAR | Status: AC
  Filled 2019-03-31: qty 2

## 2019-03-31 MED ORDER — HYDRALAZINE HCL 20 MG/ML IJ SOLN
INTRAMUSCULAR | Status: AC
  Filled 2019-03-31: qty 1

## 2019-03-31 MED ORDER — SODIUM CHLORIDE 0.9 % IV SOLN: INTRAVENOUS | Status: DC | PRN

## 2019-03-31 SURGICAL SUPPLY — 24 items
BALLN MUSTANG 6.0X40 135 (BALLOONS) ×3
BALLN MUSTANG 8X20X135 (BALLOONS) ×3
BALLOON MUSTANG 6.0X40 135 (BALLOONS) ×2 IMPLANT
BALLOON MUSTANG 8X20X135 (BALLOONS) ×2 IMPLANT
CATH ANGIO 5F PIGTAIL 100CM (CATHETERS) ×3 IMPLANT
CATH INFINITI 5FR JL4 (CATHETERS) IMPLANT
CATH INFINITI JR4 5F (CATHETERS) ×3 IMPLANT
CATH NAVICROSS ST .035X135CM (MICROCATHETER) ×3 IMPLANT
CLOSURE MYNX CONTROL 6F/7F (Vascular Products) ×3 IMPLANT
KIT ENCORE 26 ADVANTAGE (KITS) ×3 IMPLANT
KIT MICROPUNCTURE NIT STIFF (SHEATH) ×3 IMPLANT
KIT PV (KITS) ×3 IMPLANT
SHEATH PINNACLE 5F 10CM (SHEATH) ×3 IMPLANT
SHEATH PINNACLE 7F 10CM (SHEATH) ×3 IMPLANT
SHEATH PROBE COVER 6X72 (BAG) ×3 IMPLANT
SHEATH SHUTTLE 7FR (SHEATH) ×3 IMPLANT
STENT VIABAHNBX 8X29X135 (Permanent Stent) ×3 IMPLANT
STOPCOCK MORSE 400PSI 3WAY (MISCELLANEOUS) ×3 IMPLANT
SYR MEDRAD MARK 7 150ML (SYRINGE) ×3 IMPLANT
TRANSDUCER W/STOPCOCK (MISCELLANEOUS) ×3 IMPLANT
TRAY PV CATH (CUSTOM PROCEDURE TRAY) ×3 IMPLANT
WIRE HITORQ VERSACORE ST 145CM (WIRE) ×3 IMPLANT
WIRE ROSEN-J .035X260CM (WIRE) ×3 IMPLANT
WIRE VERSACORE LOC 115CM (WIRE) ×3 IMPLANT

## 2019-03-31 NOTE — Procedures (Signed)
Arterial Catheter Insertion Procedure Note Kevin Case [id number] December 22, 1940  Procedure: Insertion of Arterial Catheter  Indications: Blood pressure monitoring  Procedure Details Consent: Risks of procedure as well as the alternatives and risks of each were explained to the (patient/caregiver).  Consent for procedure obtained. Time Out: Verified patient identification, verified procedure, site/side was marked, verified correct patient position, special equipment/implants available, medications/allergies/relevent history reviewed, required imaging and test results available.  Performed  Maximum sterile technique was used including antiseptics, cap, gloves, gown, hand hygiene, mask and sheet. Skin prep: Chlorhexidine; local anesthetic administered 20 gauge catheter was inserted into left radial artery using the Seldinger technique. ULTRASOUND GUIDANCE USED: NO Evaluation Blood flow good; BP tracing good. Complications: No apparent complications.   Kevin Case 03/31/2019

## 2019-03-31 NOTE — Interval H&P Note (Signed)
History and Physical Interval Note:  03/31/2019 8:17 AM  Kevin Case  has presented today for surgery, with the diagnosis of subclavian artery stenosis.  The various methods of treatment have been discussed with the patient and family. After consideration of risks, benefits and other options for treatment, the patient has consented to  Procedure(s): AORTIC ARCH ANGIOGRAPHY (N/A) as a surgical intervention.  The patient's history has been reviewed, patient examined, no change in status, stable for surgery.  I have reviewed the patient's chart and labs.  Questions were answered to the patient's satisfaction.     Lorine Bears

## 2019-03-31 NOTE — Progress Notes (Signed)
    Called to the patient's bedside by RN. Pt became hypotensive after having a BM with BRB. Patient is pale and diaphoretic at the time of assessment. Reports hx of ulcers in stomach, colonoscopy about 2 years ago which was normal per patient report. Hgb was 8.3 post intervention this morning. Received IV heparin and aggrastat in the cath lab. Has been on ASA/plavix prior to admission. -- STAT CBC and Type and screen -- 250cc bolus x1, known EF of 30% prior to admission -- transfer to unit -- will order 1 unit PRBC now  Signed, Laverda Page, NP-C 03/31/2019, 3:44 PM Pager: [phone]

## 2019-03-31 NOTE — Progress Notes (Signed)
Patient on bedrest until 1445. Up to bathroom with assist. Gait steady. No bleeding from right femoral site with activity. Patient called out for nurse and showed tissue with bright red blood and clots on it. Noted blood in toilet red areas in water about 50% not totally bloody. Urinate 250 cc clear yellow urine. Patient was pale and complained of feeling dizzy. Returned to bed and blood pressure 76/41. Notified Laverda Page NP. Laverda Page into see patient and assessed. No complaints of pain in right groin or flank area. Femoral site without change with palpable pulse. Complained of left shoulder discomfort at 1510, pain subsided in time. Dr. Sw[place] into see patient and ordered to transfer to ICU. Dr. Swaziland spoke on the phone with patients wife and explained to patient need to transfer and plan of care.

## 2019-03-31 NOTE — Progress Notes (Signed)
PCCM Interval Progress Note  Asked to see pt for hypotension.  Had MAP in low 50's by cuff pressures this evening.  GI Bleed earlier with hgb 6.5 s/p 2u PRBC transfusions.  Repeat Hgb pending.  Art line placed and MAP's consistently mid 60s to 80s.  Levophed weaned off.   RN informed to continue current plan per cardiology.  Cards to call back if further PCCM support needed.   Rutherford Guys, PA [facility] Pager: [phone].  If no answer, [phone] 03/31/2019, 11:06 PM

## 2019-03-31 NOTE — Progress Notes (Signed)
Patient ID: Kevin Case, male   DOB: [birth year], 78 y.o.   MRN: [id number]  SBP dropped to as low as 70s and necessitated norepinephrine at low dose for a brief period.  He received another bolus of NS and completed blood transfusions, and we were able to titrate off norepinephrine.  Arterial line was placed, CBC/BMET were sent down, and IV Protonix was started.  He will need evaluation in the am by GI.   Marca Ancona 03/31/2019 11:07 PM

## 2019-03-31 NOTE — Progress Notes (Signed)
   Hemoglobin came back at 6.5. Spoke with Dr. Swaziland. Will give 2 units of PRBCs. Given EF of 30%, will give IV Lasix 20mg  between units. Will monitor CBC closely. Discussed with Dr. Sw[place]and about the need for IV Protonix. Given bright red bloody stools, does not sound like like upper GI bleed. Patient on Omeprazole 40mg  daily at home, which has been continued here. Will hold off on IV PPI at this time.  Corrin Parker, PA-C 03/31/2019 4:44 PM

## 2019-04-01 ENCOUNTER — Ambulatory Visit: Payer: Self-pay | Admitting: Physician Assistant

## 2019-04-01 ENCOUNTER — Encounter (HOSPITAL_COMMUNITY): Payer: Self-pay

## 2019-04-01 ENCOUNTER — Other Ambulatory Visit: Payer: Self-pay

## 2019-04-01 ENCOUNTER — Other Ambulatory Visit: Payer: Self-pay | Admitting: *Deleted

## 2019-04-01 DIAGNOSIS — I959 Hypotension, unspecified: Secondary | ICD-10-CM | POA: Diagnosis present

## 2019-04-01 DIAGNOSIS — I771 Stricture of artery: Secondary | ICD-10-CM | POA: Diagnosis not present

## 2019-04-01 DIAGNOSIS — I70208 Unspecified atherosclerosis of native arteries of extremities, other extremity: Secondary | ICD-10-CM | POA: Diagnosis present

## 2019-04-01 DIAGNOSIS — R001 Bradycardia, unspecified: Secondary | ICD-10-CM | POA: Diagnosis present

## 2019-04-01 DIAGNOSIS — K922 Gastrointestinal hemorrhage, unspecified: Secondary | ICD-10-CM

## 2019-04-01 DIAGNOSIS — E1122 Type 2 diabetes mellitus with diabetic chronic kidney disease: Secondary | ICD-10-CM | POA: Diagnosis present

## 2019-04-01 DIAGNOSIS — Z79899 Other long term (current) drug therapy: Secondary | ICD-10-CM | POA: Diagnosis not present

## 2019-04-01 DIAGNOSIS — D62 Acute posthemorrhagic anemia: Secondary | ICD-10-CM

## 2019-04-01 DIAGNOSIS — I447 Left bundle-branch block, unspecified: Secondary | ICD-10-CM | POA: Diagnosis present

## 2019-04-01 DIAGNOSIS — I252 Old myocardial infarction: Secondary | ICD-10-CM | POA: Diagnosis not present

## 2019-04-01 DIAGNOSIS — I130 Hypertensive heart and chronic kidney disease with heart failure and stage 1 through stage 4 chronic kidney disease, or unspecified chronic kidney disease: Secondary | ICD-10-CM | POA: Diagnosis present

## 2019-04-01 DIAGNOSIS — Z8249 Family history of ischemic heart disease and other diseases of the circulatory system: Secondary | ICD-10-CM | POA: Diagnosis not present

## 2019-04-01 DIAGNOSIS — E1151 Type 2 diabetes mellitus with diabetic peripheral angiopathy without gangrene: Secondary | ICD-10-CM | POA: Diagnosis present

## 2019-04-01 DIAGNOSIS — I2510 Atherosclerotic heart disease of native coronary artery without angina pectoris: Secondary | ICD-10-CM

## 2019-04-01 DIAGNOSIS — Z7984 Long term (current) use of oral hypoglycemic drugs: Secondary | ICD-10-CM | POA: Diagnosis not present

## 2019-04-01 DIAGNOSIS — Z87891 Personal history of nicotine dependence: Secondary | ICD-10-CM | POA: Diagnosis not present

## 2019-04-01 DIAGNOSIS — I5022 Chronic systolic (congestive) heart failure: Secondary | ICD-10-CM

## 2019-04-01 DIAGNOSIS — N183 Chronic kidney disease, stage 3 (moderate): Secondary | ICD-10-CM | POA: Diagnosis present

## 2019-04-01 DIAGNOSIS — I708 Atherosclerosis of other arteries: Secondary | ICD-10-CM | POA: Diagnosis present

## 2019-04-01 DIAGNOSIS — E785 Hyperlipidemia, unspecified: Secondary | ICD-10-CM | POA: Diagnosis present

## 2019-04-01 DIAGNOSIS — Z79891 Long term (current) use of opiate analgesic: Secondary | ICD-10-CM | POA: Diagnosis not present

## 2019-04-01 DIAGNOSIS — Z7982 Long term (current) use of aspirin: Secondary | ICD-10-CM | POA: Diagnosis not present

## 2019-04-01 DIAGNOSIS — I429 Cardiomyopathy, unspecified: Secondary | ICD-10-CM | POA: Diagnosis present

## 2019-04-01 DIAGNOSIS — I350 Nonrheumatic aortic (valve) stenosis: Secondary | ICD-10-CM | POA: Diagnosis present

## 2019-04-01 DIAGNOSIS — Z8711 Personal history of peptic ulcer disease: Secondary | ICD-10-CM | POA: Diagnosis not present

## 2019-04-01 LAB — BPAM RBC
Blood Product Expiration Date: 202006062359
Blood Product Expiration Date: 202006122359
ISSUE DATE / TIME: 202005271649
ISSUE DATE / TIME: 202005271914
Unit Type and Rh: 6200
Unit Type and Rh: 6200

## 2019-04-01 LAB — BASIC METABOLIC PANEL
Anion gap: 6 (ref 5–15)
BUN: 30 mg/dL — ABNORMAL HIGH (ref 8–23)
CO2: 20 mmol/L — ABNORMAL LOW (ref 22–32)
Calcium: 8.3 mg/dL — ABNORMAL LOW (ref 8.9–10.3)
Chloride: 111 mmol/L (ref 98–111)
Creatinine, Ser: 1.38 mg/dL — ABNORMAL HIGH (ref 0.61–1.24)
GFR calc Af Amer: 58 mL/min — ABNORMAL LOW (ref >60)
GFR calc non Af Amer: 50 mL/min — ABNORMAL LOW (ref >60)
Glucose, Bld: 155 mg/dL — ABNORMAL HIGH (ref 70–99)
Potassium: 3.7 mmol/L (ref 3.5–5.1)
Sodium: 137 mmol/L (ref 135–145)

## 2019-04-01 LAB — TYPE AND SCREEN
ABO/RH(D): A POS
Antibody Screen: NEGATIVE
Unit division: 0
Unit division: 0

## 2019-04-01 LAB — CBC WITH DIFFERENTIAL/PLATELET
Abs Immature Granulocytes: 0.05 10*3/uL (ref 0.00–0.07)
Basophils Absolute: 0 10*3/uL (ref 0.0–0.1)
Basophils Relative: 0 %
Eosinophils Absolute: 0 10*3/uL (ref 0.0–0.5)
Eosinophils Relative: 0 %
HCT: 26.5 % — ABNORMAL LOW (ref 39.0–52.0)
Hemoglobin: 8.5 g/dL — ABNORMAL LOW (ref 13.0–17.0)
Immature Granulocytes: 0 %
Lymphocytes Relative: 20 %
Lymphs Abs: 2.3 10*3/uL (ref 0.7–4.0)
MCH: 24.6 pg — ABNORMAL LOW (ref 26.0–34.0)
MCHC: 32.1 g/dL (ref 30.0–36.0)
MCV: 76.8 fL — ABNORMAL LOW (ref 80.0–100.0)
Monocytes Absolute: 1.2 10*3/uL — ABNORMAL HIGH (ref 0.1–1.0)
Monocytes Relative: 11 %
Neutro Abs: 8.2 10*3/uL — ABNORMAL HIGH (ref 1.7–7.7)
Neutrophils Relative %: 69 %
Platelets: 167 10*3/uL (ref 150–400)
RBC: 3.45 MIL/uL — ABNORMAL LOW (ref 4.22–5.81)
RDW: 16.1 % — ABNORMAL HIGH (ref 11.5–15.5)
WBC: 11.8 10*3/uL — ABNORMAL HIGH (ref 4.0–10.5)
nRBC: 0 % (ref 0.0–0.2)

## 2019-04-01 LAB — MRSA PCR SCREENING: MRSA by PCR: NEGATIVE

## 2019-04-01 LAB — TROPONIN I: Troponin I: 0.75 ng/mL (ref <0.03)

## 2019-04-01 MED ORDER — PANTOPRAZOLE SODIUM 40 MG PO TBEC
40.0000 mg | DELAYED_RELEASE_TABLET | Freq: Two times a day (BID) | ORAL | Status: DC
  Administered 2019-04-01 – 2019-04-02 (×3): 40 mg via ORAL
  Filled 2019-04-01 (×3): qty 1

## 2019-04-01 MED ORDER — CHLORHEXIDINE GLUCONATE CLOTH 2 % EX PADS
6.0000 | MEDICATED_PAD | Freq: Every day | CUTANEOUS | Status: DC
  Administered 2019-04-01: 09:00:00 6 via TOPICAL

## 2019-04-01 MED FILL — Heparin Sodium (Porcine) Inj 1000 Unit/ML: INTRAMUSCULAR | Qty: 10 | Status: AC

## 2019-04-01 NOTE — Progress Notes (Addendum)
CRITICAL VALUE ALERT  Critical Value:  Troponin 0.75  Date & Time Notied:  04/01/19 0016  Provider Notified: McLean,MD  Orders Received/Actions taken: No new orders at this time

## 2019-04-01 NOTE — Progress Notes (Signed)
Progress Note  Patient Name: Kevin Case Date of Encounter: 04/01/2019  Primary Cardiologist: Lorine Bears MD  Subjective   Feels much better this am. No further rectal bleeding. Some nausea. Was on Levophed during the night- this is now discontinued. No chest pain or dizziness.   Inpatient Medications    Scheduled Meds: . aspirin  81 mg Oral Daily  . atorvastatin  80 mg Oral QHS  . canagliflozin  100 mg Oral QAC breakfast  . Chlorhexidine Gluconate Cloth  6 each Topical Daily  . clopidogrel  75 mg Oral Daily  . docusate sodium  100 mg Oral QHS  . furosemide  20 mg Oral Daily  . losartan  25 mg Oral Daily  . Melatonin  9 mg Oral QHS  . multivitamin with minerals  1 tablet Oral Daily  . omega-3 acid ethyl esters  1 g Oral Daily  . [START ON 04/04/2019] pantoprazole  40 mg Intravenous Q12H  . sodium chloride flush  3 mL Intravenous Q12H  . cyanocobalamin  100 mcg Oral Daily   Continuous Infusions: . sodium chloride    . sodium chloride    . sodium chloride    . norepinephrine (LEVOPHED) Adult infusion 2 mcg/min (04/01/19 0600)  . pantoprozole (PROTONIX) infusion 8 mg/hr (04/01/19 0600)  . sodium chloride Stopped (03/31/19 1535)   PRN Meds: sodium chloride, Place/Maintain arterial line **AND** sodium chloride, acetaminophen, hydrALAZINE, ondansetron (ZOFRAN) IV, sodium chloride flush, traMADol   Vital Signs    Vitals:   04/01/19 0400 04/01/19 0500 04/01/19 0530 04/01/19 0600  BP: (!) 139/48 (!) 136/44 (!) 125/55 (!) 119/40  Pulse: (!) 52 63 62 (!) 53  Resp:  16 20 18   Temp: 98.5 F (36.9 C)     TempSrc: Oral     SpO2:  98% 94% 96%  Weight:      Height:        Intake/Output Summary (Last 24 hours) at 04/01/2019 0810 Last data filed at 04/01/2019 0600 Gross per 24 hour  Intake 3218.68 ml  Output 1901 ml  Net 1317.68 ml   Last 3 Weights 03/31/2019 03/25/2019 03/22/2019  Weight (lbs) 150 lb 157 lb 154 lb  Weight (kg) 68.04 kg 71.215 kg 69.854 kg       Telemetry    NSR - Personally Reviewed  ECG    none  Physical Exam   GEN: No acute distress.  Color is good today Neck: No JVD Cardiac: RRR, harsh gr 2-3/6 systolic murmur RUSB  Respiratory: Clear to auscultation bilaterally. GI: Soft, nontender, non-distended  MS: No edema; No deformity. Right groin without hematoma.  Neuro:  Nonfocal  Psych: Normal affect   Labs    Chemistry Recent Labs  Lab 03/25/19 1209 03/31/19 2146 04/01/19 0230  NA 137 139 137  K 4.3 4.8 3.7  CL 105 110 111  CO2 24 20* 20*  GLUCOSE 121* 141* 155*  BUN 32* 31* 30*  CREATININE 1.41* 1.38* 1.38*  CALCIUM 9.0 8.6* 8.3*  GFRNONAA 48* 50* 50*  GFRAA 56* 58* 58*  ANIONGAP 8 9 6      Hematology Recent Labs  Lab 03/31/19 1527 03/31/19 2143 04/01/19 0230  WBC 11.4* 11.8* 11.8*  RBC 2.69* 3.77* 3.45*  HGB 6.5* 9.3* 8.5*  HCT 21.6* 29.8* 26.5*  MCV 80.3 79.0* 76.8*  MCH 24.2* 24.7* 24.6*  MCHC 30.1 31.2 32.1  RDW 15.9* 15.6* 16.1*  PLT 187 180 167    Cardiac Enzymes Recent Labs  Lab 03/31/19 2146  TROPONINI 0.75*   No results for input(s): TROPIPOC in the last 168 hours.   BNPNo results for input(s): BNP, PROBNP in the last 168 hours.   DDimer No results for input(s): DDIMER in the last 168 hours.   Radiology    Dg Chest Port 1 View  Result Date: 03/31/2019 CLINICAL DATA:  Fluid excess history of CHF EXAM: PORTABLE CHEST 1 VIEW COMPARISON:  03/13/2019, 08/07/2011 FINDINGS: Normal heart size. No acute consolidation or effusion. No definitive edema. Aortic atherosclerosis. No pneumothorax. Old left lower rib fractures. IMPRESSION: No active disease. Electronically Signed   By: Jasmine Pang M.D.   On: 03/31/2019 23:18    Cardiac Studies   Echo 03/13/19: IMPRESSIONS    1. The left ventricle has moderate-severely reduced systolic function, with an ejection fraction of 30-35%. The cavity size was normal. There is mild concentric left ventricular hypertrophy. Left ventricular  diastolic Doppler parameters are consistent  with impaired relaxation.  2. The right ventricle has normal systolic function. The cavity was normal. There is no increase in right ventricular wall thickness.  3. Left atrial size was mildly dilated.  4. Moderate mitral annular dilatation.  5. The mitral valve is grossly normal.  6. The tricuspid valve is grossly normal.  7. The aortic valve is tricuspid. Moderate thickening of the aortic valve. Moderate calcification of the aortic valve. Aortic valve regurgitation was not assessed by color flow Doppler. Moderate stenosis of the aortic valve. Mean gradient was 18 mm Hg  but valve area was 0.76. Visually, the stenosis seems moderate and not severe.  Cardiac cath 03/15/19:  RIGHT/LEFT HEART CATH AND CORONARY ANGIOGRAPHY  Conclusion     Prox Cx lesion is 20% stenosed.  Mid Cx lesion is 80% stenosed.  Prox LAD lesion is 50% stenosed.  Mid LAD lesion is 30% stenosed.  Ost 1st Diag lesion is 50% stenosed.   1.  Left dominant coronary arteries with significant mid left circumflex stenosis.  The coronary arteries are overall heavily calcified. 2.  Right heart catheterization showed normal filling pressures, normal pulmonary pressure and normal cardiac output. 3.  Mild aortic stenosis with peak to peak gradient of only 10 mmHg. 4.  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  systolic gradient between right arm noninvasive pressure and central aortic pressure.  of systolic gradient between left arm noninvasive pressure and central aortic pressure.  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  According the patient, he was taken off antihypertensive medications about 1 to 2 years ago because his blood pressure was  running low.  It seems that his blood pressure was falsely low given significant obstructive disease affecting arterial system in both arms.   I Am going to resume losartan.  No beta-blocker due to bradycardia.  Check blood pressure in the left arm which is closer to central aortic pressure. Outpatient left subclavian artery stenting can be considered to at least be able to obtain accurate blood pressure readings on him. The patient can likely be discharged home today or tomorrow on small dose furosemide 20 mg once daily.   For his left circumflex stenosis, outpatient atherectomy and stenting can be considered for refractory angina.  I will arrange for follow-up in our office in 1 to 2 weeks.   PVI of left subclavian:  AORTIC ARCH ANGIOGRAPHY  PERIPHERAL VASCULAR INTERVENTION  Conclusion   1.  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 2.  Severe peripheral arterial disease affecting the right common femoral artery with an occluded right SFA. 3.  Successful angioplasty and balloon expandable covered stent placement to the left subclavian artery.  Recommendations: The patient had a hypotensive episode with holding pressure after partial failure of the minx closure device.  This responded to IV fluids and the patient was suspected of having a vagal response.  Will check CBC. Monitor overnight and hydrate gently given underlying chronic kidney disease. Continue dual antiplatelet therapy for at least 6 months. Possible discharge home tomorrow.     Patient Profile     78 y.o. male with PAD, CAD, CHF EF 30-35% and moderate AS. Admitted for angiography and PI of the left subclavian artery.  Assessment & Plan    1. PAD s/p percutaneous intervention of the left subclavian with stenting. Left arm feels fine. On ASA and Plavix. No groin  complication.  2. Acute lower GI bleed. Noted to have BRBPR with stool. Associated with hypotension. Hgb dropped from 9.4 to 6.5. Feels much better post transfusion. Hypotension resolved. No further clinical evidence of bleeding. Hgb up to 8.5 this am. On Protonix but suspect bleeding from rectum exacerbated by anticoagulation during the procedure. Patient reports colonoscopy 4 years ago but I cannot find report. He had upper EGD in 2018 with findings of nonbleeding gastric ulcer. 3. CAD 80% mid LCx stenosis. Managing medically. Asymptomatic. Could consider PCI for refractory symptoms. 4. Moderate AS 5. CKD stage 3. Stable.  6. Chronic systolic CHF. Hold lasix for now due to soft BP. Did receive dose IV with transfusion last night. On losartan. Not a candidate for beta blocker due to bradycardia.  7. HTN 8. HLD on high dose lipitor.  9. History of gastric ulcer.   Will transfer to telemetry today; ambulate. Monitor renal function and Hgb. If stable and no further bleeding anticipate DC tomorrow.    For questions or updates, please contact [facility] Please consult [email or web address] for contact info under        Signed, Peter [place], MD  04/01/2019, 8:10 AM

## 2019-04-02 ENCOUNTER — Encounter (HOSPITAL_COMMUNITY): Payer: Self-pay | Admitting: Cardiology

## 2019-04-02 DIAGNOSIS — K922 Gastrointestinal hemorrhage, unspecified: Secondary | ICD-10-CM

## 2019-04-02 DIAGNOSIS — I959 Hypotension, unspecified: Secondary | ICD-10-CM

## 2019-04-02 LAB — CBC
HCT: 27.5 % — ABNORMAL LOW (ref 39.0–52.0)
Hemoglobin: 8.8 g/dL — ABNORMAL LOW (ref 13.0–17.0)
MCH: 25.1 pg — ABNORMAL LOW (ref 26.0–34.0)
MCHC: 32 g/dL (ref 30.0–36.0)
MCV: 78.6 fL — ABNORMAL LOW (ref 80.0–100.0)
Platelets: 174 10*3/uL (ref 150–400)
RBC: 3.5 MIL/uL — ABNORMAL LOW (ref 4.22–5.81)
RDW: 16.4 % — ABNORMAL HIGH (ref 11.5–15.5)
WBC: 12 10*3/uL — ABNORMAL HIGH (ref 4.0–10.5)
nRBC: 0 % (ref 0.0–0.2)

## 2019-04-02 LAB — BASIC METABOLIC PANEL
Anion gap: 9 (ref 5–15)
BUN: 23 mg/dL (ref 8–23)
CO2: 22 mmol/L (ref 22–32)
Calcium: 8.9 mg/dL (ref 8.9–10.3)
Chloride: 107 mmol/L (ref 98–111)
Creatinine, Ser: 1.39 mg/dL — ABNORMAL HIGH (ref 0.61–1.24)
GFR calc Af Amer: 57 mL/min — ABNORMAL LOW (ref >60)
GFR calc non Af Amer: 49 mL/min — ABNORMAL LOW (ref >60)
Glucose, Bld: 118 mg/dL — ABNORMAL HIGH (ref 70–99)
Potassium: 3.7 mmol/L (ref 3.5–5.1)
Sodium: 138 mmol/L (ref 135–145)

## 2019-04-02 MED ORDER — PANTOPRAZOLE SODIUM 40 MG PO TBEC: 40.0000 mg | DELAYED_RELEASE_TABLET | Freq: Two times a day (BID) | ORAL | 2 refills | Status: DC

## 2019-04-02 NOTE — Discharge Summary (Addendum)
Discharge Summary    Patient ID: Kevin Case,  MRN: [id number], DOB/AGE: 12-Oct-1941 78 y.o.  Admit date: 03/31/2019 Discharge date: 04/02/2019  Primary Care Provider: Trey Sailors Primary Cardiologist: Dr. Kirke Corin   Discharge Diagnoses    Principal Problem:   Stenosis of left subclavian artery [facility]) Active Problems:   Acute systolic heart failure (HCC)   GI bleed   Hypotension   Allergies No Known Allergies  Diagnostic Studies/Procedures    PV intervention: 03/31/19  1.  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 2.  Severe peripheral arterial disease affecting the right common femoral artery with an occluded right SFA. 3.  Successful angioplasty and balloon expandable covered stent placement to the left subclavian artery.  Recommendations: The patient had a hypotensive episode with holding pressure after partial failure of the minx closure device.  This responded to IV fluids and the patient was suspected of having a vagal response.  Will check CBC. Monitor overnight and hydrate gently given underlying chronic kidney disease. Continue dual antiplatelet therapy for at least 6 months. Possible discharge home tomorrow. _____________   History of Present Illness      78 y.o. male who presented for a follow-up visit after recent hospitalization for acute systolic heart failure.  The patient was previously followed at [facility] cardiology.  He has history of sinus bradycardia, left bundle branch block, essential hypertension and hyperlipidemia. He presented recently to [facility] with worsening shortness of breath and orthopnea.  No chest discomfort.  Initially, code STEMI was activated due to left bundle branch block but it was determined that it was not new.  His previous ejection fraction was normal. An echocardiogram was performed  which showed an EF of 30 to 35%, mild to moderate aortic stenosis and mildly dilated left atrium.  The patient was noted to have very diminished pulses in his upper extremities.  Troponin was mildly elevated and peaked at 3.5. He underwent a right and left cardiac catheterization which showed left dominant coronary arteries with heavily calcified vessels and 80% stenosis in the mid left circumflex.  Right heart catheterization showed normal filling pressures, normal pulmonary pressure and normal cardiac output.  Aortic stenosis was mild with a peak gradient of 10 mmHg.  He was found to have 2% heavily calcified proximal left subclavian artery stenosis and heavy calcification in the innominate artery.  There was 60 mm systolic gradient between the right arm and central aortic pressure and 40 mm of systolic gradient between left arm and central pressure.  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   He was seen in the office on 03/25/19 by Dr. Kirke Corin and decision made to bring in for outpatient intervention to the left subclavian artery.   Hospital Course     1. PAD s/p percutaneous intervention of the left subclavian with stenting: On ASA and Plavix. No groin complication. Site closed with mynx device.    2. Acute lower GI bleed: Noted to have BRBPR with stool shortly after his cath case when he got up to the bathroom. Associated with hypotension. Hgb dropped from 9.4 to 6.5. He was transfused 2 units of PRBC. Felt much better post transfusion. Hypotension resolved, though did require pressers for short period  of time. No further clinical evidence of bleeding. Hgb up to 8.8 the morning of discharge. On Protonix but suspect bleeding from rectum exacerbated by anticoagulation during the procedure, received heparin/aggrastat. Patient reported colonoscopy 4 years  ago but I cannot find report. He had upper EGD in 2018 with findings of nonbleeding gastric ulcer. If he has recurrent rectal bleeding he will need to see GI per Dr. [place].   3. 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 Asymptomatic. Could consider PCI for refractory symptoms.  4. Moderate AS: stable during admission  5. CKD stage 3: Stable. Cr 1.39  6. Chronic systolic CHF: On Lasix 20 mg daily. Received IV lasix between transfusions.   7. HTN: Will hold losartan at discharge due to soft BP. This can be resumed on outpatient follow up if BP improved.  Not a candidate for beta blocker due to bradycardia.    8. HLD: on high dose lipitor.   9. History of gastric ulcer: consider GI outpatient if needed in the future.   Kevin Case was seen by Dr. [place] and determined stable for discharge home. Follow up in the office has been arranged. He was scheduled for an in office appt given his acute decompensation while inpatient and need for follow up labs. Medications are listed below.   _____________  Discharge Vitals Blood pressure (!) 144/53, pulse (!) 57, temperature 98.3 F (36.8 C), temperature source Oral, resp. rate 18, height 5\' 9"  (1.753 m), weight 68 kg, SpO2 99 %.  Filed Weights   03/31/19 0630 04/01/19 2351  Weight: 68 kg 68 kg    Labs & Radiologic Studies    CBC Recent Labs    04/01/19 0230 04/02/19 0601  WBC 11.8* 12.0*  NEUTROABS 8.2*  --   HGB 8.5* 8.8*  HCT 26.5* 27.5*  MCV 76.8* 78.6*  PLT 167 174   Basic Metabolic Panel Recent Labs    59/97/74 0230 04/02/19 0601  NA 137 138  K 3.7 3.7  CL 111 107  CO2 20* 22  GLUCOSE 155* 118*  BUN 30* 23  CREATININE 1.38* 1.39*  CALCIUM 8.3* 8.9   Liver Function Tests No results for input(s): AST, ALT, ALKPHOS, BILITOT, PROT, ALBUMIN in the last 72 hours. No results for input(s): LIPASE, AMYLASE in the last 72 hours. Cardiac Enzymes Recent Labs    03/31/19 2146  TROPONINI 0.75*   BNP Invalid  input(s): POCBNP D-Dimer No results for input(s): DDIMER in the last 72 hours. Hemoglobin A1C No results for input(s): HGBA1C in the last 72 hours. Fasting Lipid Panel No results for input(s): CHOL, HDL, LDLCALC, TRIG, CHOLHDL, LDLDIRECT in the last 72 hours. Thyroid Function Tests No results for input(s): TSH, T4TOTAL, T3FREE, THYROIDAB in the last 72 hours.  Invalid input(s): FREET3 _____________  Dg Chest 1 View  Result Date: 03/13/2019 CLINICAL DATA:  Pulmonary edema EXAM: CHEST  1 VIEW COMPARISON:  03/12/2019 FINDINGS: Cardiac shadow is stable. Previously seen vascular congestion and pulmonary edema has improved significantly with mild residual remaining. No focal infiltrate is seen. No bony abnormality is noted. IMPRESSION: Significant improvement in pulmonary edema when compared with the previous day. Electronically Signed   By: Alcide Clever M.D.   On: 03/13/2019 21:32   Dg Chest Port 1 View  Result Date: 03/31/2019 CLINICAL DATA:  Fluid excess history of CHF EXAM: PORTABLE CHEST 1 VIEW COMPARISON:  03/13/2019, 08/07/2011 FINDINGS: Normal heart size. No acute consolidation or effusion. No definitive edema. Aortic atherosclerosis. No pneumothorax. Old left lower  rib fractures. IMPRESSION: No active disease. Electronically Signed   By: Jasmine Pang M.D.   On: 03/31/2019 23:18   Dg Chest Port 1 View  Result Date: 03/12/2019 CLINICAL DATA:  Acute chest pain and cough. EXAM: PORTABLE CHEST 1 VIEW COMPARISON:  08/07/2011 FINDINGS: Diffuse bilateral airspace opacities are noted. The cardiomediastinal silhouette is unremarkable. There may be trace bilateral pleural effusions present. No pneumothorax or acute bony abnormality. IMPRESSION: Diffuse bilateral airspace opacities which may represent diffuse edema versus infection. Electronically Signed   By: Harmon Pier M.D.   On: 03/12/2019 17:11   Vas US Carotid  Result Date: 03/26/2019 Carotid Arterial Duplex Study Indications:       Left  subclavian artery stenosis, dizziness, occassional left                    arm muscle cramps. Risk Factors:      Hypertension, Diabetes, past history of smoking, coronary                    artery disease. Comparison Study:  None Performing Technologist: Quentin Ore RDMS, RVT, RDCS  Examination Guidelines: A complete evaluation includes B-mode imaging, spectral Doppler, color Doppler, and power Doppler as needed of all accessible portions of each vessel. Bilateral testing is considered an integral part of a complete examination. Limited examinations for reoccurring indications may be performed as noted.  Right Carotid Findings: +----------+--------+--------+--------+----------------------+--------+             PSV cm/s EDV cm/s Stenosis Describe               Comments  +----------+--------+--------+--------+----------------------+--------+  CCA Prox   239      18       >50%                                      +----------+--------+--------+--------+----------------------+--------+  CCA Distal 93       17                                                 +----------+--------+--------+--------+----------------------+--------+  ICA Prox   118      21       1-39%    hyperechoic and smooth           +----------+--------+--------+--------+----------------------+--------+  ICA Mid    101      16       1-39%                                     +----------+--------+--------+--------+----------------------+--------+  ICA Distal 101      20                                                 +----------+--------+--------+--------+----------------------+--------+  ECA        165      13                                                 +----------+--------+--------+--------+----------------------+--------+ +----------+--------+-------+----------+-------------------+  PSV cm/s EDV cms Describe   Arm Pressure (mmHG)  +----------+--------+-------+----------+-------------------+  Subclavian 85               Monophasic 80                    +----------+--------+-------+----------+-------------------+ +---------+--------+--+--------+----------+  Vertebral PSV cm/s 17 EDV cm/s Retrograde  +---------+--------+--+--------+----------+ Innominate artery vels of 483 cm/s. Monophasic brachial artery waveform  Left Carotid Findings: +----------+--------+--------+--------+----------------------+--------+             PSV cm/s EDV cm/s Stenosis Describe               Comments  +----------+--------+--------+--------+----------------------+--------+  CCA Prox   104      20                                                 +----------+--------+--------+--------+----------------------+--------+  CCA Distal 111      20                                                 +----------+--------+--------+--------+----------------------+--------+  ICA Prox   115      15       1-39%    focal and calcific               +----------+--------+--------+--------+----------------------+--------+  ICA Mid    105      14       1-39%    irregular and calcific           +----------+--------+--------+--------+----------------------+--------+  ICA Distal 148      16                                                 +----------+--------+--------+--------+----------------------+--------+  ECA        188      15                                                 +----------+--------+--------+--------+----------------------+--------+ +----------+--------+--------+--------+-------------------+  Subclavian PSV cm/s EDV cm/s Describe Arm Pressure (mmHG)  +----------+--------+--------+--------+-------------------+             404               Stenotic 88                   +----------+--------+--------+--------+-------------------+ +---------+--------+---+--------+--+---------+  Vertebral PSV cm/s 114 EDV cm/s 24 Antegrade  +---------+--------+---+--------+--+---------+ Turbulent flow in distal ICA. Monophasic brachial artery waveform  Summary: Right Carotid: Velocities in the right ICA are  consistent with a 1-39% stenosis.                Hemodynamically significant plaque >50% visualized in the CCA.                The ECA appears <50% stenosed. Stenotic innominate artery. Left Carotid: Velocities in the left ICA are consistent with a 1-39% stenosis.  Non-hemodynamically significant plaque noted in the CCA. The ECA               appears <50% stenosed. Vertebrals:  Left vertebral artery demonstrates antegrade flow. Right vertebral              artery demonstrates retrograde flow. Subclavians: Left subclavian artery was stenotic. Normal flow hemodynamics were              seen in the right subclavian artery. *See table(s) above for measurements and observations.  Vascular consult recommended. Electronically signed by Dina RichJonathan Branch MD on 03/26/2019 at 2:32:42 PM.    Final    Disposition   Pt is being discharged home today in good condition.  Follow-up Plans & Appointments    Follow-up Information    Creig HinesBerge, Christopher Ronald, NP Follow up on 04/09/2019.   Specialties:  Nurse Practitioner, Cardiology, Radiology Why:  at 9am for your follow up appt.  Contact information: [address] [place] [id number] [phone]          Discharge Instructions    Call MD for:  redness, tenderness, or signs of infection (pain, swelling, redness, odor or green/yellow discharge around incision site)   Complete by:  As directed    Diet - low sodium heart healthy   Complete by:  As directed    Discharge instructions   Complete by:  As directed    Groin Site Care Refer to this sheet in the next few weeks. These instructions provide you with information on caring for yourself after your procedure. Your caregiver may also give you more specific instructions. Your treatment has been planned according to current medical practices, but problems sometimes occur. Call your caregiver if you have any problems or questions after your procedure. HOME CARE INSTRUCTIONS You may  shower 24 hours after the procedure. Remove the bandage (dressing) and gently wash the site with plain soap and water. Gently pat the site dry.  Do not apply powder or lotion to the site.  Do not sit in a bathtub, swimming pool, or whirlpool for 5 to 7 days.  No bending, squatting, or lifting anything over 10 pounds (4.5 kg) as directed by your caregiver.  Inspect the site at least twice daily.  Do not drive home if you are discharged the same day of the procedure. Have someone else drive you.  You may drive 24 hours after the procedure unless otherwise instructed by your caregiver.  What to expect: Any bruising will usually fade within 1 to 2 weeks.  Blood that collects in the tissue (hematoma) may be painful to the touch. It should usually decrease in size and tenderness within 1 to 2 weeks.  SEEK IMMEDIATE MEDICAL CARE IF: You have unusual pain at the groin site or down the affected leg.  You have redness, warmth, swelling, or pain at the groin site.  You have drainage (other than a small amount of blood on the dressing).  You have chills.  You have a fever or persistent symptoms for more than 72 hours.  You have a fever and your symptoms suddenly get worse.  Your leg becomes pale, cool, tingly, or numb.  You have heavy bleeding from the site. Hold pressure on the site. .   Please monitor for recurrent GI bleeding as your are on aspirin and plavix. If recurrence you will need to follow up with GI MD for further management.   Increase activity slowly   Complete by:  As directed       Discharge Medications     Medication List    STOP taking these medications   losartan 25 MG tablet Commonly known as:  COZAAR   omeprazole 40 MG capsule Commonly known as:  PRILOSEC     TAKE these medications   acetaminophen 500 MG tablet Commonly known as:  TYLENOL Take 1,000 mg by mouth every 6 (six) hours as needed (headache.).   aspirin 81 MG chewable tablet Chew 1 tablet (81 mg total)  by mouth daily.   atorvastatin 80 MG tablet Commonly known as:  LIPITOR Take 80 mg by mouth at bedtime.   clopidogrel 75 MG tablet Commonly known as:  PLAVIX Take 1 tablet (75 mg total) by mouth daily.   cyanocobalamin 100 MCG tablet Take 100 mcg by mouth daily.   docusate sodium 100 MG capsule Commonly known as:  COLACE Take 100 mg by mouth daily.   Fish Oil 1000 MG Caps Take 1,000 mg by mouth daily.   furosemide 20 MG tablet Commonly known as:  LASIX Take 20 mg by mouth daily.   Jardiance 10 MG Tabs tablet Generic drug:  empagliflozin Take 10 mg by mouth daily.   Melatonin 10 MG Tabs Take 10 mg by mouth at bedtime.   multivitamin with minerals Tabs tablet Take 1 tablet by mouth daily. Multivitamin for Senior 50+   pantoprazole 40 MG tablet Commonly known as:  PROTONIX Take 1 tablet (40 mg total) by mouth 2 (two) times daily.   traMADol 50 MG tablet Commonly known as:  Ultram Take 1 tablet (50 mg total) by mouth every 6 (six) hours as needed. What changed:  reasons to take this        Acute coronary syndrome (MI, NSTEMI, STEMI, etc) this admission?: No.     Outstanding Labs/Studies   CBC/ BMET at office visit.   Duration of Discharge Encounter   Greater than 30 minutes including physician time.  Signed, Laverda Page NP-C 04/02/2019, 12:23 PM  Patient seen and examined and history reviewed. Agree with above findings and plan. See earlier rounding note.  Rohan Juenger [place], MDFACC 04/02/2019 12:45 PM

## 2019-04-02 NOTE — Progress Notes (Signed)
Progress Note  Patient Name: Kevin Case Date of Encounter: 04/02/2019  Primary Cardiologist: Lorine BearsMuhammad Arida MD  Subjective   Feels well this am.  No stools. mild nausea. No chest pain or dizziness.   Inpatient Medications    Scheduled Meds: . aspirin  81 mg Oral Daily  . atorvastatin  80 mg Oral QHS  . canagliflozin  100 mg Oral QAC breakfast  . Chlorhexidine Gluconate Cloth  6 each Topical Daily  . clopidogrel  75 mg Oral Daily  . docusate sodium  100 mg Oral QHS  . furosemide  20 mg Oral Daily  . Melatonin  9 mg Oral QHS  . multivitamin with minerals  1 tablet Oral Daily  . omega-3 acid ethyl esters  1 g Oral Daily  . pantoprazole  40 mg Oral BID  . sodium chloride flush  3 mL Intravenous Q12H  . cyanocobalamin  100 mcg Oral Daily   Continuous Infusions: . sodium chloride    . sodium chloride Stopped (03/31/19 1535)   PRN Meds: acetaminophen, hydrALAZINE, ondansetron (ZOFRAN) IV, sodium chloride flush, traMADol   Vital Signs    Vitals:   04/01/19 2031 04/01/19 2340 04/01/19 2351 04/02/19 0454  BP: (!) 120/48 (!) 95/59 (!) 95/59 (!) 99/44  Pulse: 67 67 67 70  Resp: 18 18 18 16   Temp: 98.4 F (36.9 C) 98.3 F (36.8 C) 98.3 F (36.8 C) 98.9 F (37.2 C)  TempSrc: Oral Oral Oral Oral  SpO2: 100% 100%  98%  Weight:   68 kg   Height:   5\' 9"  (1.753 m)     Intake/Output Summary (Last 24 hours) at 04/02/2019 0716 Last data filed at 04/02/2019 0026 Gross per 24 hour  Intake 1046.53 ml  Output 1175 ml  Net -128.47 ml   Last 3 Weights 04/01/2019 03/31/2019 03/25/2019  Weight (lbs) 149 lb 14.6 oz 150 lb 157 lb  Weight (kg) 68 kg 68.04 kg 71.215 kg      Telemetry    NSR - Personally Reviewed  ECG    none  Physical Exam   GEN: No acute distress.  Color is good today Neck: No JVD Cardiac: RRR, harsh gr 2-3/6 systolic murmur RUSB  Respiratory: Clear to auscultation bilaterally. GI: Soft, nontender, non-distended  MS: No edema; No deformity. Right  groin without hematoma.  Neuro:  Nonfocal  Psych: Normal affect   Labs    Chemistry Recent Labs  Lab 03/31/19 2146 04/01/19 0230 04/02/19 0601  NA 139 137 138  K 4.8 3.7 3.7  CL 110 111 107  CO2 20* 20* 22  GLUCOSE 141* 155* 118*  BUN 31* 30* 23  CREATININE 1.38* 1.38* 1.39*  CALCIUM 8.6* 8.3* 8.9  GFRNONAA 50* 50* 49*  GFRAA 58* 58* 57*  ANIONGAP 9 6 9      Hematology Recent Labs  Lab 03/31/19 2143 04/01/19 0230 04/02/19 0601  WBC 11.8* 11.8* 12.0*  RBC 3.77* 3.45* 3.50*  HGB 9.3* 8.5* 8.8*  HCT 29.8* 26.5* 27.5*  MCV 79.0* 76.8* 78.6*  MCH 24.7* 24.6* 25.1*  MCHC 31.2 32.1 32.0  RDW 15.6* 16.1* 16.4*  PLT 180 167 174    Cardiac Enzymes Recent Labs  Lab 03/31/19 2146  TROPONINI 0.75*   No results for input(s): TROPIPOC in the last 168 hours.   BNPNo results for input(s): BNP, PROBNP in the last 168 hours.   DDimer No results for input(s): DDIMER in the last 168 hours.   Radiology    Dg Chest  Port 1 View  Result Date: 03/31/2019 CLINICAL DATA:  Fluid excess history of CHF EXAM: PORTABLE CHEST 1 VIEW COMPARISON:  03/13/2019, 08/07/2011 FINDINGS: Normal heart size. No acute consolidation or effusion. No definitive edema. Aortic atherosclerosis. No pneumothorax. Old left lower rib fractures. IMPRESSION: No active disease. Electronically Signed   By: Jasmine Pang M.D.   On: 03/31/2019 23:18    Cardiac Studies   Echo 03/13/19: IMPRESSIONS    1. The left ventricle has moderate-severely reduced systolic function, with an ejection fraction of 30-35%. The cavity size was normal. There is mild concentric left ventricular hypertrophy. Left ventricular diastolic Doppler parameters are consistent  with impaired relaxation.  2. The right ventricle has normal systolic function. The cavity was normal. There is no increase in right ventricular wall thickness.  3. Left atrial size was mildly dilated.  4. Moderate mitral annular dilatation.  5. The mitral valve is  grossly normal.  6. The tricuspid valve is grossly normal.  7. The aortic valve is tricuspid. Moderate thickening of the aortic valve. Moderate calcification of the aortic valve. Aortic valve regurgitation was not assessed by color flow Doppler. Moderate stenosis of the aortic valve. Mean gradient was 18 mm Hg  but valve area was 0.76. Visually, the stenosis seems moderate and not severe.  Cardiac cath 03/15/19:  RIGHT/LEFT HEART CATH AND CORONARY ANGIOGRAPHY  Conclusion     Prox Cx lesion is 20% stenosed.  Mid Cx lesion is 80% stenosed.  Prox LAD lesion is 50% stenosed.  Mid LAD lesion is 30% stenosed.  Ost 1st Diag lesion is 50% stenosed.   1.  Left dominant coronary arteries with significant mid left circumflex stenosis.  The coronary arteries are overall heavily calcified. 2.  Right heart catheterization showed normal filling pressures, normal pulmonary pressure and normal cardiac output. 3.  Mild aortic stenosis with peak to peak gradient of only 10 mmHg. 4.  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  systolic gradient between right arm noninvasive pressure and central aortic pressure.  of systolic gradient between left arm noninvasive pressure and central aortic pressure.  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  According the patient, he was taken off antihypertensive medications about 1 to 2 years ago because his blood pressure was running low.  It seems that his blood pressure was falsely low given significant obstructive disease affecting arterial system in both arms.   I Am going to resume losartan.  No beta-blocker due to bradycardia.  Check blood pressure in the left arm which is closer to central aortic pressure. Outpatient left  subclavian artery stenting can be considered to at least be able to obtain accurate blood pressure readings on him. The patient can likely be discharged home today or tomorrow on small dose furosemide 20 mg once daily.   For his left circumflex stenosis, outpatient atherectomy and stenting can be considered for refractory angina.  I will arrange for follow-up in our office in 1 to 2 weeks.   PVI of left subclavian:  AORTIC ARCH ANGIOGRAPHY  PERIPHERAL VASCULAR INTERVENTION  Conclusion   1.  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 2.  Severe peripheral arterial disease affecting the right common femoral  artery with an occluded right SFA. 3.  Successful angioplasty and balloon expandable covered stent placement to the left subclavian artery.  Recommendations: The patient had a hypotensive episode with holding pressure after partial failure of the minx closure device.  This responded to IV fluids and the patient was suspected of having a vagal response.  Will check CBC. Monitor overnight and hydrate gently given underlying chronic kidney disease. Continue dual antiplatelet therapy for at least 6 months. Possible discharge home tomorrow.     Patient Profile     78 y.o. male with PAD, CAD, CHF EF 30-35% and moderate AS. Admitted for angiography and PI of the left subclavian artery.  Assessment & Plan    1. PAD s/p percutaneous intervention of the left subclavian with stenting. Left arm feels fine. On ASA and Plavix. No groin complication.  2. Acute lower GI bleed. Noted to have BRBPR with stool. Associated with hypotension. Hgb dropped from 9.4 to 6.5. Feels much better post transfusion. Hypotension resolved. No further clinical evidence of bleeding. Hgb up to 8.8 this am. On Protonix but suspect bleeding from rectum exacerbated by  anticoagulation during the procedure. Patient reports colonoscopy 4 years ago but I cannot find report. He had upper EGD in 2018 with findings of nonbleeding gastric ulcer. If he has recurrent rectal bleeding he will need to see GI.  3. CAD 80% mid LCx stenosis. Managing medically. Asymptomatic. Could consider PCI for refractory symptoms. 4. Moderate AS 5. CKD stage 3. Stable.  6. Chronic systolic CHF. On Lasix 20 mg daily. Will hold losartan now due to soft BP. This can be resumed on outpatient follow up if BP improved.  Not a candidate for beta blocker due to bradycardia.  7. HTN 8. HLD on high dose lipitor.  9. History of gastric ulcer.   Patient is ambulating in halls without symptoms. He is stable for DC today.   For questions or updates, please contact [facility] Please consult [email or web address] for contact info under        Signed, Peter [place], MD  04/02/2019, 7:16 AM

## 2019-04-02 NOTE — Progress Notes (Signed)
Patient felt nauseated from breakfast per pt statement. Mouth care was provided and pt requested nausea medication.  Zofran PRN given.  Patient is currently lying in bed watching television.

## 2019-04-02 NOTE — Progress Notes (Signed)
Discharged packet given to patient with medication education with teach back. Questions and concerns answered. Patients belongings in pts bag: wedding ring on and dentures. Family member in valet entrance. Peripheral iv removed clean dry and intact, pressure and dressing applied. Patient to be transported in wheelchair by nurse tech.

## 2019-04-03 ENCOUNTER — Telehealth: Payer: Self-pay | Admitting: Physician Assistant

## 2019-04-03 NOTE — Telephone Encounter (Signed)
Pt's wife called with a question regarding discharge medications. She states lipitor was not listed as continued. I reviewed chart and recommended continuing lipitor as he had been taking it prior to discharge - given at bedtime. She expressed understanding and thanks me for the phone call.   Roe Rutherford Jp [place]as[place], PA-C 04/03/2019, 8:39 AM

## 2019-04-08 ENCOUNTER — Telehealth: Payer: Self-pay

## 2019-04-08 NOTE — Telephone Encounter (Signed)
° ° °  COVID-19 Pre-Screening Questions: ° °• In the past 7 to 10 days have you had a cough,  shortness of breath, headache, congestion, fever (100 or greater) body aches, chills, sore throat, or sudden loss of taste or sense of smell?no °•  °• Have you been around anyone with known Covid 19.no °•  °• Have you been around anyone who is awaiting Covid 19 test results in the past 7 to 10 days? No °•  °• Have you been around anyone who has been exposed to Covid 19, or has mentioned symptoms of Covid 19 within the past 7 to 10 days? No °•  ° °If you have any concerns/questions about symptoms patients report during screening (either on the phone or at threshold). Contact the provider seeing the patient or DOD for further guidance.  If neither are available contact a member of the leadership team. ° ° ° °   ° ° ° ° °

## 2019-04-09 ENCOUNTER — Ambulatory Visit: Payer: Medicare Other | Admitting: Nurse Practitioner

## 2019-04-09 ENCOUNTER — Encounter: Payer: Self-pay | Admitting: Nurse Practitioner

## 2019-04-09 ENCOUNTER — Other Ambulatory Visit: Payer: Self-pay

## 2019-04-09 VITALS — BP 126/78 | HR 62 | Ht 69.0 in | Wt 153.0 lb

## 2019-04-09 DIAGNOSIS — K625 Hemorrhage of anus and rectum: Secondary | ICD-10-CM

## 2019-04-09 DIAGNOSIS — I255 Ischemic cardiomyopathy: Secondary | ICD-10-CM

## 2019-04-09 DIAGNOSIS — D649 Anemia, unspecified: Secondary | ICD-10-CM

## 2019-04-09 DIAGNOSIS — I5022 Chronic systolic (congestive) heart failure: Secondary | ICD-10-CM

## 2019-04-09 DIAGNOSIS — I2510 Atherosclerotic heart disease of native coronary artery without angina pectoris: Secondary | ICD-10-CM

## 2019-04-09 DIAGNOSIS — E785 Hyperlipidemia, unspecified: Secondary | ICD-10-CM

## 2019-04-09 DIAGNOSIS — Z79899 Other long term (current) drug therapy: Secondary | ICD-10-CM | POA: Diagnosis not present

## 2019-04-09 DIAGNOSIS — D62 Acute posthemorrhagic anemia: Secondary | ICD-10-CM

## 2019-04-09 DIAGNOSIS — I739 Peripheral vascular disease, unspecified: Secondary | ICD-10-CM

## 2019-04-09 DIAGNOSIS — I6523 Occlusion and stenosis of bilateral carotid arteries: Secondary | ICD-10-CM

## 2019-04-09 DIAGNOSIS — I350 Nonrheumatic aortic (valve) stenosis: Secondary | ICD-10-CM

## 2019-04-09 MED ORDER — LOSARTAN POTASSIUM 25 MG PO TABS: 12.5000 mg | ORAL_TABLET | Freq: Every day | ORAL | 3 refills | Status: DC

## 2019-04-09 NOTE — Patient Instructions (Signed)
Medication Instructions:  - Your physician has recommended you make the following change in your medication:   1) Resume losartan 25 mg- take 1/2 tablet (12.5 mg) by mouth once daily  If you need a refill on your cardiac medications before your next appointment, please call your pharmacy.   Lab work: - Your physician recommends that you have lab work today:  BMP/ CBC  - Your physician recommends that you return for lab work in: 1 week- BMP Please go to the [facility] entrance, 1st desk on the right to check in This is done on a walk-in basis, Monday-Friday (7:30 am- 5:00 pm)  If you have labs (blood work) drawn today and your tests are completely normal, you will receive your results only by: Marland Kitchen MyChart Message (if you have MyChart) OR . A paper copy in the mail If you have any lab test that is abnormal or we need to change your treatment, we will call you to review the results.  Testing/Procedures: - none ordered  Follow-Up: At [facility], you and your health needs are our priority.  As part of our continuing mission to provide you with exceptional heart care, we have created designated Provider Care Teams.  These Care Teams include your primary Cardiologist (physician) and Advanced Practice Providers (APPs -  Physician Assistants and Nurse Practitioners) who all work together to provide you with the care you need, when you need it. . in 1 month with Dr. Kirke Corin (in office visit)  Any Other Special Instructions Will Be Listed Below (If Applicable). - N/A

## 2019-04-09 NOTE — Addendum Note (Signed)
Addended by: Aurelio Jew on: 04/09/2019 10:55 AM   Modules accepted: Orders

## 2019-04-09 NOTE — Progress Notes (Signed)
Office Visit    Patient Name: Kevin Case Date of Encounter: 04/09/2019  Primary Care Provider:  Trey Sailors, PA-C Primary Cardiologist:  Lorine Bears, MD  Chief Complaint    78 year old male with a history of sinus bradycardia, hypertension, diabetes, left bundle branch block, and hyperlipidemia, who was recently admitted to [facility] and subsequently [facility] with findings of mixed ischemic and nonischemic cardiomyopathy, coronary artery disease, subclavian and femoral arterial disease, moderate aortic stenosis, and GI bleed requiring 2 units of packed red blood cells, who presents for post hospital follow-up of all of the above.  Past Medical History    Past Medical History:  Diagnosis Date   Anemia    CAD (coronary artery disease)    a. 03/2019 Cath: LM nl, LAD 50p CA2+, 58m, D1 50ost, LCX 20p, 79m, RCA small, mild diff dzs-->Med rx. Consider PCI LCX for refractory angina.   Carotid arterial disease (HCC)    a. 03/2019 Carotid U/S: RICA 1-39%, RECA <50%, LICA 1-39%, LECA <50%.   Clotting disorder (HCC)    Diabetes mellitus without complication (HCC)    GIB (gastrointestinal bleeding)    a. 03/2019 BRBPR following PV procedure req anticoagulation-->2u PRBCs.   HFrEF (heart failure with reduced ejection fraction) (HCC)    a. 03/2019 Echo: EF 30-35%, mild conc LVH. Mildly dil LA. Mod MV annular dil. Mod AS (mean grad , Valve area 0.76).   History of tobacco abuse    Hypertension    Ischemic cardiomyopathy    a. 03/2019 Echo: EF 30-35%.   Left bundle branch block    Moderate aortic stenosis    a. 03/2019 Echo:  Mod AS (mean grad , Valve area 0.76).   PAD (peripheral artery disease) (HCC)    a. 03/2019 PV Angio: RCFA 80, RSFA 100.   Rib fracture    Sinus bradycardia    Subclavian arterial stenosis (HCC)    a. 03/2019 PV Angio: RSCA 90 after origin of CCA. RCCA 30ost, LSCA 80 (PTA and covered stenting).   Past Surgical History:  Procedure  Laterality Date   AORTIC ARCH ANGIOGRAPHY N/A 03/31/2019   Procedure: AORTIC ARCH ANGIOGRAPHY;  Surgeon: Iran Ouch, MD;  Location: [facility] INVASIVE CV LAB;  Service: Cardiovascular;  Laterality: N/A;   CARDIAC CATHETERIZATION     PERIPHERAL VASCULAR INTERVENTION Left 03/31/2019   Procedure: PERIPHERAL VASCULAR INTERVENTION;  Surgeon: Iran Ouch, MD;  Location: [facility] INVASIVE CV LAB;  Service: Cardiovascular;  Laterality: Left;   RIGHT/LEFT HEART CATH AND CORONARY ANGIOGRAPHY N/A 03/15/2019   Procedure: RIGHT/LEFT HEART CATH AND CORONARY ANGIOGRAPHY;  Surgeon: Iran Ouch, MD;  Location: [facility] INVASIVE CV LAB;  Service: Cardiovascular;  Laterality: N/A;    Allergies  No Known Allergies  History of Present Illness    78 year old male with the above complex past medical history including bradycardia, hypertension, left bundle branch block, hyperlipidemia, and diabetes.  He had previously been followed at [facility].  About 2 years ago, he was taken off of antihypertensive medication secondary to low blood pressures.  He was admitted to [facility] in early May 2020 secondary to progressive dyspnea and non-STEMI.  He was found to have pulmonary edema.  COVID testing was negative.  He was admitted and diuresed.  Echo showed LV dysfunction with an EF of 30 to 35%.  Moderate aortic stenosis was also noted.  Right and left heart cardiac catheterization was performed and showed 50% proximal LAD disease with 80% circumflex stenosis.  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  It was felt that subclavian disease likely explained previously documented low blood pressures as he had a gradient between his central aortic pressure and his left arm.  Further, it was felt that he would require PTA of the left subclavian in the future.  Medical therapy was recommended for the left circumflex with plan for PCI only if he had  refractory angina.  Overall, it was felt that his cardiomyopathy was out of proportion to his coronary disease.  He was placed on low-dose losartan.  No beta-blocker in setting of baseline bradycardia.    He was subsequently discharged and followed up with Dr. Kirke Corin on May 21.  At that point, arrangements were made for peripheral angiography.  This revealed a 90% stenosis in the right subclavian artery after the origin of the common carotid artery.  There was an 80% stenosis in the left subclavian artery.  He was also found to have an 80% stenosis in a heavily calcified right common femoral artery while the right SFA appeared to be occluded.  PTA and covered stenting was performed within the left subclavian artery.Post procedure, he developed bright red blood per rectum with drop in hemoglobin from 9.4-6.5.  He required 2 units of packed red blood cells.  He did experience hypotension in the setting of anemia requiring discontinuation of losartan and he briefly required norepinephrine.  Protonix therapy was initiated.  It was ultimately felt the bleeding was exacerbated by anticoagulation (heparin/Aggrastat) use during his percutaneous intervention.  H&H were stable and he had no further evidence of bleeding and he was discharged home.    Since discharge, he has done relatively well.  He reports persistent fatigue but has been active in his yard without chest pain or dyspnea.  His weight has been stable.  He has tolerated his medications without any recurrent bright red blood per rectum or melena.  He denies palpitations, PND, orthopnea, dizziness, syncope, edema, or early satiety.  His left arm blood pressures have been mostly trending in the mid 120s to 130s.  He uses his arms frequently in his garden and denies any claudication symptoms in either his upper extremities or lower extremities.  Home Medications    Prior to Admission medications   Medication Sig Start Date End Date Taking? Authorizing  Provider  acetaminophen (TYLENOL) 500 MG tablet Take 1,000 mg by mouth every 6 (six) hours as needed (headache.).   Yes [provider]  aspirin 81 MG chewable tablet Chew 1 tablet (81 mg total) by mouth daily. 03/17/19  Yes Salary, Evelena Asa, MD  atorvastatin (LIPITOR) 80 MG tablet Take 80 mg by mouth at bedtime.  01/05/19  Yes [provider]  clopidogrel (PLAVIX) 75 MG tablet Take 1 tablet (75 mg total) by mouth daily. 03/25/19  Yes Iran Ouch, MD  cyanocobalamin 100 MCG tablet Take 100 mcg by mouth daily.   Yes [provider]  docusate sodium (COLACE) 100 MG capsule Take 100 mg by mouth daily.    Yes [provider]  furosemide (LASIX) 20 MG tablet Take 20 mg by mouth daily.  12/14/18  Yes [provider]  JARDIANCE 10 MG TABS tablet Take 10 mg by mouth daily. 02/24/19  Yes [provider]  Melatonin 10 MG TABS Take 10 mg by mouth at bedtime.    Yes [provider]  Multiple Vitamin (MULTIVITAMIN WITH MINERALS) TABS tablet Take 1 tablet by mouth  daily. Multivitamin for Senior 50+   Yes [provider]  Omega-3 Fatty Acids (FISH OIL) 1000 MG CAPS Take 1,000 mg by mouth daily.   Yes [provider]  pantoprazole (PROTONIX) 40 MG tablet Take 1 tablet (40 mg total) by mouth 2 (two) times daily. 04/02/19  Yes Arty Baumgartner, NP  traMADol (ULTRAM) 50 MG tablet Take 1 tablet (50 mg total) by mouth every 6 (six) hours as needed. Patient taking differently: Take 50 mg by mouth every 6 (six) hours as needed (pain).  03/16/19  Yes Salary, Evelena Asa, MD    Review of Systems    Reports fatigue but then also reports reasonably good exercise tolerance without chest pain, dyspnea, palpitations, PND, orthopnea, dizziness, syncope, edema, early satiety, or upper/lower extremity claudication.  All other systems reviewed and are otherwise negative except as noted above.  Physical Exam    VS:  BP 126/78 (BP Location: Left Arm,  Patient Position: Sitting, Cuff Size: Normal)    Pulse 62    Ht  (1.753 m)    Wt 153 lb (69.4 kg)    BMI 22.59 kg/m  , BMI Body mass index is 22.59 kg/m. GEN: Well nourished, well developed, in no acute distress. HEENT: normal. Neck: Supple, no JVD, bilateral carotid bruits, no masses. Cardiac: RRR, 2/6 systolic ejection murmur heard throughout and loudest at the upper sternal borders-radiating to the neck.  No rubs, or gallops. No clubbing, cyanosis, edema.  Radials/DP/PT 2+ and equal bilaterally.  Respiratory:  Respirations regular and unlabored, diminished breath sounds bilaterally. GI: Soft, nontender, nondistended, BS + x 4.  No abdominal bruits. MS: no deformity or atrophy. Skin: warm and dry, no rash. Neuro:  Strength and sensation are intact. Psych: Normal affect.  Accessory Clinical Findings    ECG personally reviewed by me today -regular sinus rhythm, 62, left axis deviation, left bundle branch block- no acute changes.  Lab Results  Component Value Date   WBC 12.0 (H) 04/02/2019   HGB 8.8 (L) 04/02/2019   HCT 27.5 (L) 04/02/2019   MCV 78.6 (L) 04/02/2019   PLT 174 04/02/2019   Lab Results  Component Value Date   CREATININE 1.39 (H) 04/02/2019   BUN 23 04/02/2019   NA 138 04/02/2019   K 3.7 04/02/2019   CL 107 04/02/2019   CO2 22 04/02/2019   Lab Results  Component Value Date   ALT 30 03/13/2019   AST 50 (H) 03/13/2019   ALKPHOS 54 03/13/2019   BILITOT 0.7 03/13/2019    Assessment & Plan    1.  HFrEF/mixed ischemic and nonischemic cardiomyopathy: EF 30 to 35% in early May in the setting of dyspnea and pulmonary edema.  He was found to have significant circumflex disease but ultimately, LV dysfunction is felt to be out of proportion to his coronary disease.  Following most recent hospitalization which was complicated by GI bleeding requiring transfusion as well as hypotension, his losartan was discontinued.  His blood pressures in his left arm since then have  been trending in the 120s and 130s.  He has been feeling well without chest pain or dyspnea.  Volume is stable today.  I am going to follow-up a basic metabolic panel today and plan to resume losartan 12.5 mg daily.  I will follow-up a basic metabolic panel in 1 week as well.  No beta-blocker in the setting of prior history of bradycardia.  If he tolerates reinitiation of losartan, we could consider switching to [facility]  at some point or adding Spironolactone however, given significant innominate artery disease and also mild carotid disease, he would remain at risk for cerebral hypoperfusion.  Ultimately, he will require follow-up echo on medical therapy in a few months to reevaluate LV function.  If LV dysfunction persists, he may benefit from EP referral for CRT in the setting of left bundle branch block.  2.  Coronary artery disease: Status post non-STEMI in early May.  He has known 80% stenosis in the mid left circumflex which was heavily calcified.  He has not been having any chest pain.  As previously noted, would plan to continue medical therapy unless he develops angina.  He remains on aspirin, statin and is currently on Plavix in the setting of recent peripheral intervention.  3.  Innominate and left subclavian artery stenosis: Status post left subclavian artery stenting.  Residual innominate disease.  He denies claudication in either arm and notes that he uses arms quite frequently while working in his garden.  He remains on aspirin and Plavix.  4.  Essential hypertension: Pressures have been trending in the mid 120s to 130s since discharge.  Adding low-dose losartan today in the setting of cardiomyopathy.  5.  Hyperlipidemia: LDL was 59 in August 2016.  This is a last lipid profile we have on record.  We can plan to arrange for follow-up after his next visit.  6.  Lower extremity peripheral arterial disease: Significant right common femoral artery disease with an occluded right superficial  femoral artery.  He denies claudication.  He remains on aspirin and statin.  Currently on Plavix in the setting of above.  7.  Acute blood loss anemia/GI bleeding: This occurred following his percutaneous intervention in late May.  Discharge H&H were 8.8 and 27.5 after 2 units of blood.  He has not had any recurrent bleeding and has not noted any dark stools.  Follow-up CBC today in the setting of ongoing aspirin and Plavix therapy.  If he has any recurrent bleeding or persistent anemia, he will need to follow-up with gastroenterology.  Continue PPI therapy.  8.  Moderate aortic stenosis: Noted on echo with plan for future follow-up.  9.  Carotid arterial disease: 1 to 39% bilateral carotid arterial disease on recent ultrasound.  Continue aspirin and statin therapy.  10.  Disposition: Follow-up basic metabolic panel and CBC today.  Follow-up basic metabolic panel in 1 week in the setting of resumption of losartan therapy.  He is scheduled for follow-up carotid/subclavian ultrasound in 2 weeks.  Follow-up with Dr. Kirke Corin in 1 month or sooner if necessary.  Nicolasa Ducking, NP 04/09/2019, 10:01 AM

## 2019-04-10 LAB — CBC WITH DIFFERENTIAL/PLATELET
Basophils Absolute: 0 10*3/uL (ref 0.0–0.2)
Basos: 0 %
EOS (ABSOLUTE): 0.2 10*3/uL (ref 0.0–0.4)
Eos: 3 %
Hematocrit: 27.2 % — ABNORMAL LOW (ref 37.5–51.0)
Hemoglobin: 8.6 g/dL — ABNORMAL LOW (ref 13.0–17.7)
Immature Grans (Abs): 0 10*3/uL (ref 0.0–0.1)
Immature Granulocytes: 0 %
Lymphocytes Absolute: 2 10*3/uL (ref 0.7–3.1)
Lymphs: 23 %
MCH: 25.1 pg — ABNORMAL LOW (ref 26.6–33.0)
MCHC: 31.6 g/dL (ref 31.5–35.7)
MCV: 80 fL (ref 79–97)
Monocytes Absolute: 0.8 10*3/uL (ref 0.1–0.9)
Monocytes: 9 %
Neutrophils Absolute: 5.7 10*3/uL (ref 1.4–7.0)
Neutrophils: 65 %
Platelets: 229 10*3/uL (ref 150–450)
RBC: 3.42 x10E6/uL — ABNORMAL LOW (ref 4.14–5.80)
RDW: 16.7 % — ABNORMAL HIGH (ref 11.6–15.4)
WBC: 8.8 10*3/uL (ref 3.4–10.8)

## 2019-04-10 LAB — BASIC METABOLIC PANEL
BUN/Creatinine Ratio: 19 (ref 10–24)
BUN: 29 mg/dL — ABNORMAL HIGH (ref 8–27)
CO2: 22 mmol/L (ref 20–29)
Calcium: 8.9 mg/dL (ref 8.6–10.2)
Chloride: 103 mmol/L (ref 96–106)
Creatinine, Ser: 1.55 mg/dL — ABNORMAL HIGH (ref 0.76–1.27)
GFR calc Af Amer: 50 mL/min/{1.73_m2} — ABNORMAL LOW (ref >59)
GFR calc non Af Amer: 43 mL/min/{1.73_m2} — ABNORMAL LOW (ref >59)
Glucose: 185 mg/dL — ABNORMAL HIGH (ref 65–99)
Potassium: 4.6 mmol/L (ref 3.5–5.2)
Sodium: 138 mmol/L (ref 134–144)

## 2019-04-12 ENCOUNTER — Telehealth: Payer: Self-pay | Admitting: *Deleted

## 2019-04-12 MED ORDER — FUROSEMIDE 20 MG PO TABS: 10.0000 mg | ORAL_TABLET | Freq: Every day | ORAL | 3 refills | Status: DC

## 2019-04-12 MED ORDER — FERROUS SULFATE 325 (65 FE) MG PO TABS: 325.0000 mg | ORAL_TABLET | Freq: Every day | ORAL | 1 refills | Status: DC

## 2019-04-12 NOTE — Telephone Encounter (Signed)
Results called to patient and wife. Wife wrote down all the instructions and verbalized understanding and read them back as listed in the results. She remembered to go to the [place] this Friday 6/12. Rx for ferrous sulfate sent to pharmacy. Wife will split the furosemide 20 mg tablets in half to make 10 mg.

## 2019-04-12 NOTE — Telephone Encounter (Signed)
-----   Message from Theora Gianotti, NP sent at 04/12/2019  5:45 AM EDT ----- Kidneys appear a little dry.  Reduce lasix to 10mg  daily and encourage adequate hydration.  Blood count (h/h) remains low but stable.  Let's add ferrous sulfate 325mg  daily.  F/u bmet in 1 wk as already planned.

## 2019-04-16 ENCOUNTER — Other Ambulatory Visit
Admission: RE | Admit: 2019-04-16 | Discharge: 2019-04-16 | Disposition: A | Discharge status: Home / Self Care | Payer: Medicare Other | Attending: Nurse Practitioner | Admitting: Nurse Practitioner

## 2019-04-16 ENCOUNTER — Other Ambulatory Visit: Payer: Self-pay

## 2019-04-16 ENCOUNTER — Telehealth: Payer: Self-pay | Admitting: Cardiovascular Disease

## 2019-04-16 DIAGNOSIS — I5022 Chronic systolic (congestive) heart failure: Secondary | ICD-10-CM

## 2019-04-16 DIAGNOSIS — Z79899 Other long term (current) drug therapy: Secondary | ICD-10-CM | POA: Diagnosis present

## 2019-04-16 LAB — BASIC METABOLIC PANEL
Anion gap: 9 (ref 5–15)
BUN: 24 mg/dL — ABNORMAL HIGH (ref 8–23)
CO2: 23 mmol/L (ref 22–32)
Calcium: 8.8 mg/dL — ABNORMAL LOW (ref 8.9–10.3)
Chloride: 107 mmol/L (ref 98–111)
Creatinine, Ser: 1.27 mg/dL — ABNORMAL HIGH (ref 0.61–1.24)
GFR calc Af Amer: >60 mL/min (ref >60)
GFR calc non Af Amer: 55 mL/min — ABNORMAL LOW (ref >60)
Glucose, Bld: 202 mg/dL — ABNORMAL HIGH (ref 70–99)
Potassium: 4.2 mmol/L (ref 3.5–5.1)
Sodium: 139 mmol/L (ref 135–145)

## 2019-04-16 NOTE — Telephone Encounter (Signed)
Patient request dr. Saunders Revel fill out handicapped form.  Printed for patient and placed in nurse box

## 2019-04-18 ENCOUNTER — Encounter: Payer: Self-pay | Admitting: Family

## 2019-04-19 ENCOUNTER — Other Ambulatory Visit: Payer: Self-pay | Admitting: *Deleted

## 2019-04-19 DIAGNOSIS — R1909 Other intra-abdominal and pelvic swelling, mass and lump: Secondary | ICD-10-CM

## 2019-04-22 ENCOUNTER — Ambulatory Visit (INDEPENDENT_AMBULATORY_CARE_PROVIDER_SITE_OTHER): Payer: Medicare Other

## 2019-04-22 ENCOUNTER — Other Ambulatory Visit: Payer: Self-pay

## 2019-04-22 DIAGNOSIS — R1909 Other intra-abdominal and pelvic swelling, mass and lump: Secondary | ICD-10-CM

## 2019-04-22 DIAGNOSIS — I771 Stricture of artery: Secondary | ICD-10-CM

## 2019-04-22 NOTE — Telephone Encounter (Signed)
Handicap form completed and signed by Dr Fletcher Anon. Placed at front desk for patient to pick up when he comes in for procedure later today.

## 2019-04-22 NOTE — Telephone Encounter (Signed)
Spoke with Gabriel Cirri who said patient's wife is calling to check on status of form. Advised I have it in hand and am going to have Dr Fletcher Anon sign and complete today. Instructed Gabriel Cirri to let her know we will call once done.

## 2019-04-23 ENCOUNTER — Telehealth: Payer: Self-pay | Admitting: *Deleted

## 2019-04-23 NOTE — Telephone Encounter (Signed)
Follow up  ° ° °Patient is returning call.  °

## 2019-04-23 NOTE — Telephone Encounter (Signed)
-----   Message from Wellington Hampshire, MD sent at 04/23/2019 10:14 AM EDT ----- No evidence of complications in the right groin.

## 2019-04-23 NOTE — Telephone Encounter (Signed)
Patient made aware of results and verbalized understanding.  

## 2019-04-23 NOTE — Telephone Encounter (Signed)
Left a message for the patient to call back.   Notes recorded by Wellington Hampshire, MD on 04/23/2019 at 10:13 AM EDT  Patent left subclavian artery stent with improved blood flow to the left arm. There is residual stenosis beyond the stent but that will be monitored for now.  Notes recorded by Wellington Hampshire, MD on 04/23/2019 at 10:14 AM EDT  No evidence of complications in the right groin.

## 2019-04-30 ENCOUNTER — Telehealth: Payer: Self-pay

## 2019-04-30 NOTE — Telephone Encounter (Signed)
   TELEPHONE CALL NOTE  This patient has been deemed a candidate for follow-up tele-health visit to limit community exposure during the Covid-19 pandemic. I spoke with the patient via phone to discuss instructions. The patient was advised to review the section on consent for treatment as well. The patient will receive a phone call 2-3 days prior to their E-Visit at which time consent will be verbally confirmed. A Virtual Office Visit appointment type has been scheduled for 05/03/2019 with Darylene Price FNP.  Gaylord Shih, CMA 04/30/2019 12:37 PM

## 2019-04-30 NOTE — Telephone Encounter (Signed)
TELEPHONE CALL NOTE  Kevin Case has been deemed a candidate for a follow-up tele-health visit to limit community exposure during the Covid-19 pandemic. I spoke with the patient via phone to ensure availability of phone/video source, confirm preferred email & phone number, discuss instructions and expectations, and review consent.   I reminded Kevin Case to be prepared with any vital sign and/or heart rhythm information that could potentially be obtained via home monitoring, at the time of his visit.  Finally, I reminded Kevin Case to expect an e-mail containing a link for their video-based visit approximately 15 minutes before his visit, or alternatively, a phone call at the time of his visit if his visit is planned to be a phone encounter.  Did the patient verbally consent to treatment as below? YES  Gaylord Shih, CMA 04/30/2019 12:37 PM  CONSENT FOR TELE-HEALTH VISIT - PLEASE REVIEW  I hereby voluntarily request, consent and authorize [facility] and its employed or contracted physicians, physician assistants, nurse practitioners or other licensed health care professionals (the Practitioner), to provide me with telemedicine health care services (the "Services") as deemed necessary by the treating Practitioner. I acknowledge and consent to receive the Services by the Practitioner via telemedicine. I understand that the telemedicine visit will involve communicating with the Practitioner through telephonic communication technology and the disclosure of certain medical information by electronic transmission. I acknowledge that I have been given the opportunity to request an in-person assessment or other available alternative prior to the telemedicine visit and am voluntarily participating in the telemedicine visit.  I understand that I have the right to withhold or withdraw my consent to the use of telemedicine in the course of my care at any time, without affecting my  right to future care or treatment, and that the Practitioner or I may terminate the telemedicine visit at any time. I understand that I have the right to inspect all information obtained and/or recorded in the course of the telemedicine visit and may receive copies of available information for a reasonable fee.  I understand that some of the potential risks of receiving the Services via telemedicine include:  Marland Kitchen Delay or interruption in medical evaluation due to technological equipment failure or disruption; . Information transmitted may not be sufficient (e.g. poor resolution of images) to allow for appropriate medical decision making by the Practitioner; and/or  . In rare instances, security protocols could fail, causing a breach of personal health information.  Furthermore, I acknowledge that it is my responsibility to provide information about my medical history, conditions and care that is complete and accurate to the best of my ability. I acknowledge that Practitioner's advice, recommendations, and/or decision may be based on factors not within their control, such as incomplete or inaccurate data provided by me or lack of visual representation. I understand that the practice of medicine is not an exact science and that Practitioner makes no warranties or guarantees regarding treatment outcomes. I acknowledge that I will receive a copy of this consent concurrently upon execution via email to the email address I last provided but may also request a printed copy by calling the office of [facility].    I understand that my insurance may be billed for this visit.   I have read or had this consent read to me. . I understand the contents of this consent, which adequately explains the benefits and risks of the Services being provided via telemedicine.  . I have been  provided ample opportunity to ask questions regarding this consent and the Services and have had my questions answered to my  satisfaction. . I give my informed consent for the services to be provided through the use of telemedicine in my medical care  By participating in this telemedicine visit I agree to the above.

## 2019-05-03 ENCOUNTER — Ambulatory Visit: Payer: Medicare Other | Attending: Family | Admitting: Family

## 2019-05-03 ENCOUNTER — Encounter: Payer: Self-pay | Admitting: Family

## 2019-05-03 ENCOUNTER — Other Ambulatory Visit: Payer: Self-pay

## 2019-05-03 ENCOUNTER — Telehealth: Payer: Self-pay | Admitting: Cardiovascular Disease

## 2019-05-03 VITALS — BP 134/54 | HR 55 | Wt 145.0 lb

## 2019-05-03 DIAGNOSIS — I5022 Chronic systolic (congestive) heart failure: Secondary | ICD-10-CM

## 2019-05-03 DIAGNOSIS — I10 Essential (primary) hypertension: Secondary | ICD-10-CM

## 2019-05-03 DIAGNOSIS — R1909 Other intra-abdominal and pelvic swelling, mass and lump: Secondary | ICD-10-CM

## 2019-05-03 DIAGNOSIS — E119 Type 2 diabetes mellitus without complications: Secondary | ICD-10-CM

## 2019-05-03 NOTE — Progress Notes (Signed)
Virtual Visit via Telephone Note   Evaluation Performed:  Follow-up visit  This visit type was conducted due to national recommendations for restrictions regarding the COVID-19 Pandemic (e.g. social distancing).  This format is felt to be most appropriate for this patient at this time.  All issues noted in this document were discussed and addressed.  No physical exam was performed (except for noted visual exam findings with Video Visits).  Please refer to the patient's chart (MyChart message for video visits and phone note for telephone visits) for the patient's consent to telehealth for [facility]  Date:  05/03/2019   ID:  Kevin Case, DOB 09-09-1941, MRN [id number]  Patient Location:  [address] [place] [address]   Provider location:   [facility] [address] [place], [place] [address]  PCP:  Trey Sailors, PA-C  Cardiologist:  Lorine Bears, MD  Electrophysiologist:  None   Chief Complaint:  fatigue  History of Present Illness:    Kevin Case is a 78 y.o. male who presents via audio/video conferencing for a telehealth visit today.  Patient verified DOB and address.  The patient does not have symptoms concerning for COVID-19 infection (fever, chills, cough, or new SHORTNESS OF BREATH).   Patient reports minimal fatigue upon moderate exertion. He describes this as chronic in nature having been present for several years. He has associated left shoulder blade pain and bulging in his right groin along with this. He denies any dizziness, swelling in his legs/ abdomen, palpitations, chest pain, shortness of breath, cough, difficulty sleeping or weight gain. He says that he's quite active doing a lot of walking around his 7 acres and cleaning out his inground pool.   He is most concerned about the bulging that is present in his right groin. Says that this is the groin where his catheterization was done. Had groin ultrasound done which  was negative for pseudoaneurysm.   Prior CV studies:   The following studies were reviewed today:  Echo report from 03/13/2019 reviewed and showed an EF of 30-35% along with mild MR  Catheterization done 03/15/2019 showed:  Prox Cx lesion is 20% stenosed.  Mid Cx lesion is 80% stenosed.  Prox LAD lesion is 50% stenosed.  Mid LAD lesion is 30% stenosed.  Ost 1st Diag lesion is 50% stenosed.  Past Medical History:  Diagnosis Date  . Anemia   . CAD (coronary artery disease)    a. 03/2019 Cath: LM nl, LAD 50p CA2+, 41m, D1 50ost, LCX 20p, 52m, RCA small, mild diff dzs-->Med rx. Consider PCI LCX for refractory angina.  . Carotid arterial disease (HCC)    a. 03/2019 Carotid U/S: RICA 1-39%, RECA <50%, LICA 1-39%, LECA <50%.  . Clotting disorder (HCC)   . Diabetes mellitus without complication (HCC)   . GIB (gastrointestinal bleeding)    a. 03/2019 BRBPR following PV procedure req anticoagulation-->2u PRBCs.  . HFrEF (heart failure with reduced ejection fraction) (HCC)    a. 03/2019 Echo: EF 30-35%, mild conc LVH. Mildly dil LA. Mod MV annular dil. Mod AS (mean grad , Valve area 0.76).  Marland Kitchen History of tobacco abuse   . Hypertension   . Ischemic cardiomyopathy    a. 03/2019 Echo: EF 30-35%.  . Left bundle branch block   . Moderate aortic stenosis    a. 03/2019 Echo:  Mod AS (mean grad , Valve area 0.76).  Marland Kitchen PAD (peripheral artery disease) (HCC)    a. 03/2019  PV Angio: RCFA 80, RSFA 100.  . Rib fracture   . Sinus bradycardia   . Subclavian arterial stenosis (HCC)    a. 03/2019 PV Angio: RSCA 90 after origin of CCA. RCCA 30ost, LSCA 80 (PTA and covered stenting).   Past Surgical History:  Procedure Laterality Date  . AORTIC ARCH ANGIOGRAPHY N/A 03/31/2019   Procedure: AORTIC ARCH ANGIOGRAPHY;  Surgeon: Iran OuchArida, Muhammad A, MD;  Location: [facility];  Service: Cardiovascular;  Laterality: N/A;  . CARDIAC CATHETERIZATION    . PERIPHERAL VASCULAR INTERVENTION Left 03/31/2019    Procedure: PERIPHERAL VASCULAR INTERVENTION;  Surgeon: Iran OuchArida, Muhammad A, MD;  Location: [facility]VAS[facility] CV [facility];  Service: Cardiovascular;  Laterality: Left;  . RIGHT/LEFT HEART CATH AND CORONARY ANGIOGRAPHY N/A 03/15/2019   Procedure: RIGHT/LEFT HEART CATH AND CORONARY ANGIOGRAPHY;  Surgeon: Iran OuchArida, Muhammad A, MD;  Location: [facility] INVASIVE CV LAB;  Service: Cardiovascular;  Laterality: N/A;     Current Meds  Medication Sig  . acetaminophen (TYLENOL) 500 MG tablet Take 1,000 mg by mouth every 6 (six) hours as needed (headache.).  Marland Kitchen. aspirin 81 MG chewable tablet Chew 1 tablet (81 mg total) by mouth daily.  Marland Kitchen. atorvastatin (LIPITOR) 80 MG tablet Take 80 mg by mouth at bedtime.   . clopidogrel (PLAVIX) 75 MG tablet Take 1 tablet (75 mg total) by mouth daily.  . cyanocobalamin 100 MCG tablet Take 100 mcg by mouth daily.  Marland Kitchen. docusate sodium (COLACE) 100 MG capsule Take 100 mg by mouth daily.   . ferrous sulfate 325 (65 FE) MG tablet Take 1 tablet (325 mg total) by mouth daily with breakfast.  . furosemide (LASIX) 20 MG tablet Take 0.5 tablets (10 mg total) by mouth daily.  Marland Kitchen. JARDIANCE 10 MG TABS tablet Take 10 mg by mouth daily.  Marland Kitchen. losartan (COZAAR) 25 MG tablet Take 0.5 tablets (12.5 mg total) by mouth daily.  . Melatonin 10 MG TABS Take 10 mg by mouth at bedtime.   . Multiple Vitamin (MULTIVITAMIN WITH MINERALS) TABS tablet Take 1 tablet by mouth daily. Multivitamin for Senior 50+  . Omega-3 Fatty Acids (FISH OIL) 1000 MG CAPS Take 1,000 mg by mouth daily.  . pantoprazole (PROTONIX) 40 MG tablet Take 1 tablet (40 mg total) by mouth 2 (two) times daily.  . traMADol (ULTRAM) 50 MG tablet Take 1 tablet (50 mg total) by mouth every 6 (six) hours as needed. (Patient taking differently: Take 50 mg by mouth every 6 (six) hours as needed (pain). )     Allergies:   Patient has no known allergies.   Social History   Tobacco Use  . Smoking status: Former Games developermoker  . Smokeless tobacco: Never Used  Substance  Use Topics  . Alcohol use: No    Frequency: Never  . Drug use: Never     Family Hx: The patient's family history includes Congestive Heart Failure in his father.  ROS:   Please see the history of present illness.     All other systems reviewed and are negative.   Labs/Other Tests and Data Reviewed:    Recent Labs: 03/12/2019: B Natriuretic Peptide 864.0 03/13/2019: ALT 30 04/09/2019: Hemoglobin 8.6; Platelets 229 04/16/2019: BUN 24; Creatinine, Ser 1.27; Potassium 4.2; Sodium 139   Recent Lipid Panel No results found for: CHOL, TRIG, HDL, CHOLHDL, LDLCALC, LDLDIRECT  Wt Readings from Last 3 Encounters:  05/03/19 145 lb (65.8 kg)  04/09/19 153 lb (69.4 kg)  04/01/19 149 lb 14.6 oz (68 kg)  Exam:    Vital Signs:  BP (!) 134/54 Comment: self-reported  Pulse (!) 55 Comment: self-reported  Wt 145 lb (65.8 kg) Comment: self-reported  BMI 21.41 kg/m    Well nourished, well developed male in no  acute distress.   ASSESSMENT & PLAN:    1.  Chronic heart failure with reduced ejection fraction- - NYHA class II - euvolemic today based on patient's description of symptoms - weighing daily and says that his weight has been stable. Instructed to call for an overnight weight gain of >2 pounds or a weekly weight gain of >5 pounds - not adding salt and is trying to follow a low sodium diet - saw cardiology Sharolyn Douglas) 04/09/2019 and returns 05/20/2019 - BNP 03/12/2019 was 864.0 - wearing compression socks daily - has been bradycardic so unable to use beta-blocker - doubt BP could tolerate changing losartan to entresto  2: HTN- - BP at home today looks good - does have significant obstructive disease in arterial system in both arms - saw PCP Ky Barban) 11/24/2018 and is transferring care to [place] on 05/04/2019 - BMP 04/16/2019 reviewed and showed sodium 139, potassium 4.2, creatinine 1.27 and GFR 55  3: DM-   - not checking his glucose at home very often - on jardiance  4: Groin  swelling- - patient says that he continues to have "bulging like a hernia" in his groin where his catheterization was at - encouraged him to call Dr. Tyrell Antonio office for further advice regarding this  COVID-19 Education: The signs and symptoms of COVID-19 were discussed with the patient and how to seek care for testing (follow up with PCP or arrange E-visit).  The importance of social distancing was discussed today.  Patient Risk:   After full review of this patients clinical status, I feel that they are at least moderate risk at this time.  Time:   Today, I have spent 12 minutes with the patient with telehealth technology discussing medications, diet and symptoms to report.     Medication Adjustments/Labs and Tests Ordered: Current medicines are reviewed at length with the patient today.  Concerns regarding medicines are outlined above.   Tests Ordered: No orders of the defined types were placed in this encounter.  Medication Changes: No orders of the defined types were placed in this encounter.   Disposition:  Follow-up in 3 months or sooner for any questions/problems before then.   Signed, Alisa Graff, FNP  05/03/2019 12:49 PM    [facility]

## 2019-05-03 NOTE — Telephone Encounter (Signed)
Based on his description, he might have inguinal hernia which is not related to the cath.  I agree that he should see his primary care physician tomorrow.  If his PCP thinks it is vascular, I will need to see him in the office and examine him.

## 2019-05-03 NOTE — Patient Instructions (Signed)
Continue weighing daily and call for an overnight weight gain of > 2 pounds or a weekly weight gain of >5 pounds. 

## 2019-05-03 NOTE — Telephone Encounter (Signed)
Patient wife states  cath site is still swelling. States when patient coughs, he experiences pain. States it looks like a "buldge" . Please call to discuss.

## 2019-05-03 NOTE — Telephone Encounter (Signed)
Call to patient, spoke with him and his wife.   Pt had cath 5/27. He reports swelling in r groin area since cath. He was seen in clinic on 6/5 and had u/s of R groin on 6/18. Per reading, "Wellington Hampshire, MD on 04/23/2019 at 10:14 AM EDT  No evidence of complications in the right groin."  Pt expresses concern that swelling has not resolved. He denies heat in area. No tenderness or change in size unless he is coughing, then area becomes more prominent and painful. Pt reports supporting area when he coughs to decrease pain. He denies any pain in testicles and BMs have been at [place].   Pt has an appt at PCP tomorrow at 2. I encouraged him to speak with her about current sx and POC.  Advised pt to call for any further questions or concerns.   Routed to Dr. Fletcher Anon for any further advice.

## 2019-05-04 ENCOUNTER — Other Ambulatory Visit: Payer: Self-pay

## 2019-05-04 ENCOUNTER — Encounter: Payer: Self-pay | Admitting: Physician Assistant

## 2019-05-04 ENCOUNTER — Ambulatory Visit (INDEPENDENT_AMBULATORY_CARE_PROVIDER_SITE_OTHER): Payer: Medicare Other | Admitting: Physician Assistant

## 2019-05-04 VITALS — BP 124/52 | HR 58 | Temp 97.9°F | Resp 16 | Ht 69.0 in | Wt 149.2 lb

## 2019-05-04 DIAGNOSIS — R1909 Other intra-abdominal and pelvic swelling, mass and lump: Secondary | ICD-10-CM

## 2019-05-04 DIAGNOSIS — Z131 Encounter for screening for diabetes mellitus: Secondary | ICD-10-CM | POA: Diagnosis not present

## 2019-05-04 DIAGNOSIS — I214 Non-ST elevation (NSTEMI) myocardial infarction: Secondary | ICD-10-CM

## 2019-05-04 DIAGNOSIS — D649 Anemia, unspecified: Secondary | ICD-10-CM | POA: Diagnosis not present

## 2019-05-04 DIAGNOSIS — E1122 Type 2 diabetes mellitus with diabetic chronic kidney disease: Secondary | ICD-10-CM

## 2019-05-04 DIAGNOSIS — N183 Chronic kidney disease, stage 3 unspecified: Secondary | ICD-10-CM

## 2019-05-04 NOTE — Progress Notes (Signed)
Patient: Kevin Case, Male    DOB: 06/20/1941, 78 y.o.   MRN: [id number] Visit Date: 05/04/2019  Today's Provider: Trey SailorsAdriana M Taunia Frasco, PA-C   Chief Complaint  Patient presents with  . New Patient (Initial Visit)   Subjective:     Annual physical exam Kevin Spruceldred Macphee is a 78 y.o. male who presents today for health maintenance and complete physical. He feels well. He reports exercising moderately. He reports he is sleeping well. Lives in [place], married 25 years to his wife, Rex KrasDonita Mcguirk, who accompanies him today. He has one living child. Reports being quite active, enjoys gardening and his pool. [facility].   Patient has an involved heart history. History from 04/09/2019 visit with Nicolasa Duckinghristopher Berge, NP in cardiology is copied below:   78 year old male with the above complex past medical history including bradycardia, hypertension, left bundle branch block, hyperlipidemia, and diabetes.  [facility].  About 2 years ago, he was taken off of antihypertensive medication secondary to low blood pressures.  He was admitted to [facility] in early May 2020 secondary to progressive dyspnea and non-STEMI.  He was found to have pulmonary edema.  COVID testing was negative.  He was admitted and diuresed.  Echo showed LV dysfunction with an EF of 30 to 35%.  Moderate aortic stenosis was also noted.  Right and left heart cardiac catheterization was performed and showed 50% proximal LAD disease with 80% circumflex stenosis.  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  It was felt that subclavian disease likely explained previously documented low blood pressures as he had a 40mmHg gradient between his central aortic pressure and his left arm.  Further, it was felt that he would require PTA of the left subclavian in the future.  Medical therapy was recommended for the left circumflex  with plan for PCI only if he had refractory angina.  Overall, it was felt that his cardiomyopathy was out of proportion to his coronary disease.  He was placed on low-dose losartan.  No beta-blocker in setting of baseline bradycardia.    He was subsequently discharged and followed up with Dr. Kirke CorinArida on May 21.  At that point, arrangements were made for peripheral angiography.  This revealed a 90% stenosis in the right subclavian artery after the origin of the common carotid artery.  There was an 80% stenosis in the left subclavian artery.  He was also found to have an 80% stenosis in a heavily calcified right common femoral artery while the right SFA appeared to be occluded.  PTA and covered stenting was performed within the left subclavian artery.Post procedure, he developed bright red blood per rectum with drop in hemoglobin from 9.4-6.5.  He required 2 units of packed red blood cells.  He did experience hypotension in the setting of anemia requiring discontinuation of losartan and he briefly required norepinephrine.  Protonix therapy was initiated.  It was ultimately felt the bleeding was exacerbated by anticoagulation (heparin/Aggrastat) use during his percutaneous intervention.  H&H were stable and he had no further evidence of bleeding and he was discharged home.    Since discharge, he has done relatively well.  He reports persistent fatigue but has been active in his yard without chest pain or dyspnea.  His weight has been stable.  He has tolerated his medications without any recurrent bright red blood per rectum or melena.  He denies palpitations, PND,  orthopnea, dizziness, syncope, edema, or early satiety.  His left arm blood pressures have been mostly trending in the mid 120s to 130s.  He uses his arms frequently in his garden and denies any claudication symptoms in either his upper extremities or lower extremities.  After followup, patient's wife reports he is on 12.5 mg QD losartan. He is also on  plavix 75 mg and ASA 81 mg daily. He is also taking 20 mg Lasix for fluid management.   Type II DM: He is currently on jardiance 10 mg QD. He was changed from metformin to jardiance for borderline low kidney function and to also help his heart. His last eye exam was last year at [facility]. He is due for one this year. He is UTD on pneumonia vaccinations.   Colonoscopy: 2013 with [facility]  Groin Mass: Patient has a mass in his right inguinal area that he noticed develop after his catheterization. He underwent ultrasound on 04/22/2019 which did not reveal pseudoaneurysm. He reports the mass has increased from a quarter in size to larger. The mass gets bigger when he coughs, sneezes and gets smaller when he lies down. He denies difficulty with bowel movements.    Anemia: After catheterization on 03/31/2019 patient experienced BRB per rectum and dropped three hemoglobin points. He was transfused with 2 units or pRBCs. He is taking iron supplements. Denies previous history of GI bleeds. Denies any black stools since.  -----------------------------------------------------------------   Review of Systems  Constitutional: Positive for activity change.  HENT: Positive for mouth sores.   Eyes: Negative.   Respiratory: Negative.   Cardiovascular: Negative.   Gastrointestinal: Negative.   Endocrine: Negative.   Genitourinary: Negative.   Musculoskeletal: Negative.   Skin: Negative.   Allergic/Immunologic: Negative.   Neurological: Positive for dizziness and light-headedness.  Hematological: Negative.   Psychiatric/Behavioral: Negative.     Social History      He  reports that he has quit smoking. He has never used smokeless tobacco. He reports that he does not drink alcohol or use drugs.       Social History   Socioeconomic History  . Marital status: Married    Spouse name: Not on file  . Number of children: Not on file  . Years of education: Not on file  . Highest education level: Not  on file  Occupational History  . Not on file  Social Needs  . Financial resource strain: Not on file  . Food insecurity    Worry: Not on file    Inability: Not on file  . Transportation needs    Medical: Not on file    Non-medical: Not on file  Tobacco Use  . Smoking status: Former Games developer  . Smokeless tobacco: Never Used  Substance and Sexual Activity  . Alcohol use: No    Frequency: Never  . Drug use: Never  . Sexual activity: Not on file  Lifestyle  . Physical activity    Days per week: Not on file    Minutes per session: Not on file  . Stress: Not on file  Relationships  . Social Musician on phone: Not on file    Gets together: Not on file    Attends religious service: Not on file    Active member of club or organization: Not on file    Attends meetings of clubs or organizations: Not on file    Relationship status: Not on file  Other Topics Concern  .  Not on file  Social History Narrative  . Not on file    Past Medical History:  Diagnosis Date  . Anemia   . CAD (coronary artery disease)    a. 03/2019 Cath: LM nl, LAD 50p CA2+, 87m, D1 50ost, LCX 20p, 19m, RCA small, mild diff dzs-->Med rx. Consider PCI LCX for refractory angina.  . Carotid arterial disease ([place])    a. 03/2019 Carotid U/S: RICA 7-82%, RECA <95%, LICA 6-21%, LECA <30%.  . Clotting disorder ([place])   . Diabetes mellitus without complication ([place])   . GIB (gastrointestinal bleeding)    a. 03/2019 BRBPR following PV procedure req anticoagulation-->2u PRBCs.  . HFrEF (heart failure with reduced ejection fraction) ([place])    a. 03/2019 Echo: EF 30-35%, mild conc LVH. Mildly dil LA. Mod MV annular dil. Mod AS (mean grad 66mmHg, Valve area 0.76).  Marland Kitchen History of tobacco abuse   . Hypertension   . Ischemic cardiomyopathy    a. 03/2019 Echo: EF 30-35%.  . Left bundle branch block   . Moderate aortic stenosis    a. 03/2019 Echo:  Mod AS (mean grad 31mmHg, Valve area 0.76).  Marland Kitchen PAD (peripheral artery  disease) ([place])    a. 03/2019 PV Angio: RCFA 80, RSFA 100.  . Rib fracture   . Sinus bradycardia   . 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 03/2019 PV Angio: RSCA 90 after origin of CCA. RCCA 30ost, LSCA 80 (PTA and covered stenting).     Patient Active Problem List   Diagnosis Date Noted  . GI bleed 04/02/2019  . Hypotension 04/02/2019  . 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 Non-ST elevation (NSTEMI) myocardial infarction ([place])   . Acute systolic heart failure ([place])   . Acute respiratory failure with hypoxia ([place]) 03/12/2019    Past Surgical History:  Procedure Laterality Date  . AORTIC ARCH ANGIOGRAPHY N/A 03/31/2019   Procedure: AORTIC ARCH ANGIOGRAPHY;  Surgeon: Wellington Hampshire, MD;  Location: [facility];  Service: Cardiovascular;  Laterality: N/A;  . CARDIAC CATHETERIZATION    . PERIPHERAL VASCULAR INTERVENTION Left 03/31/2019   Procedure: PERIPHERAL VASCULAR INTERVENTION;  Surgeon: Wellington Hampshire, MD;  Location: [facility];  Service: Cardiovascular;  Laterality: Left;  . RIGHT/LEFT HEART CATH AND CORONARY ANGIOGRAPHY N/A 03/15/2019   Procedure: RIGHT/LEFT HEART CATH AND CORONARY ANGIOGRAPHY;  Surgeon: Wellington Hampshire, MD;  Location: [facility];  Service: Cardiovascular;  Laterality: N/A;    Family History        Family Status  Relation Name Status  . Father  Deceased  . Mother  Deceased        His family history includes Congestive Heart Failure in his father.      No Known Allergies   Current Outpatient Medications:  .  acetaminophen (TYLENOL) 500 MG tablet, Take 1,000 mg by mouth every 6 (six) hours as needed (headache.)., Disp: , Rfl:  .  aspirin 81 MG chewable tablet, Chew 1 tablet (81 mg total) by mouth daily., Disp: 30 tablet, Rfl: 0 .  atorvastatin (LIPITOR) 80 MG tablet, Take 80 mg by mouth at bedtime. , Disp: , Rfl:  .  clopidogrel (PLAVIX) 75 MG tablet, Take 1 tablet (75 mg total) by mouth daily., Disp: 90  tablet, Rfl: 1 .  cyanocobalamin 100 MCG tablet, Take 100 mcg by mouth daily., Disp: , Rfl:  .  docusate sodium (COLACE) 100 MG capsule, Take 100 mg by mouth daily. ,  Disp: , Rfl:  .  ferrous sulfate 325 (65 FE) MG tablet, Take 1 tablet (325 mg total) by mouth daily with breakfast., Disp: 90 tablet, Rfl: 1 .  furosemide (LASIX) 20 MG tablet, Take 0.5 tablets (10 mg total) by mouth daily., Disp: 90 tablet, Rfl: 3 .  JARDIANCE 10 MG TABS tablet, Take 10 mg by mouth daily., Disp: , Rfl:  .  losartan (COZAAR) 25 MG tablet, Take 0.5 tablets (12.5 mg total) by mouth daily., Disp: 45 tablet, Rfl: 3 .  Melatonin 10 MG TABS, Take 10 mg by mouth at bedtime. , Disp: , Rfl:  .  Multiple Vitamin (MULTIVITAMIN WITH MINERALS) TABS tablet, Take 1 tablet by mouth daily. Multivitamin for Senior 50+, Disp: , Rfl:  .  Omega-3 Fatty Acids (FISH OIL) 1000 MG CAPS, Take 1,000 mg by mouth daily., Disp: , Rfl:  .  pantoprazole (PROTONIX) 40 MG tablet, Take 1 tablet (40 mg total) by mouth 2 (two) times daily., Disp: 30 tablet, Rfl: 2 .  traMADol (ULTRAM) 50 MG tablet, Take 1 tablet (50 mg total) by mouth every 6 (six) hours as needed. (Patient taking differently: Take 50 mg by mouth every 6 (six) hours as needed (pain). ), Disp: 8 tablet, Rfl: 0   Patient Care Team: Maryella ShiversPollak, Vence Lalor M, PA-C as PCP - General (Physician Assistant) [place] OuchArida, Muhammad A, MD as PCP - Cardiology (Cardiology)    Objective:    Vitals: BP (!) 124/52 (BP Location: Left Arm, Patient Position: Sitting, Cuff Size: Normal)   Pulse (!) 58   Temp 97.9 F (36.6 C) (Oral)   Resp 16   Ht 5\' 9"  (1.753 m)   Wt 149 lb 3.2 oz (67.7 kg)   SpO2 99%   BMI 22.03 kg/m    Vitals:   05/04/19 1418 05/04/19 1421  BP: (!) 88/55 (!) 124/52  Pulse: (!) 57 (!) 58  Resp: 16   Temp: 97.9 F (36.6 C)   TempSrc: Oral   SpO2: 99%   Weight: 149 lb 3.2 oz (67.7 kg)   Height: 5\' 9"  (1.753 m)      Physical Exam Constitutional:      Appearance: Normal  appearance.  Cardiovascular:     Rate and Rhythm: Normal rate and regular rhythm.     Heart sounds: Murmur present.  Pulmonary:     Effort: Pulmonary effort is normal.     Breath sounds: Normal breath sounds.  Skin:    General: Skin is warm and dry.  Neurological:     Mental Status: He is alert and oriented to person, place, and time. Mental status is at baseline.  Psychiatric:        Mood and Affect: Mood normal.        Behavior: Behavior normal.      Depression Screen PHQ 2/9 Scores 05/04/2019  PHQ - 2 Score 0  PHQ- 9 Score 3       Assessment & Plan:     Routine Health Maintenance and Physical Exam  Exercise Activities and Dietary recommendations Goals   None      There is no immunization history on file for this patient.  Health Maintenance  Topic Date Due  . COLONOSCOPY  10/09/1993  . INFLUENZA VACCINE  06/05/2019  . TETANUS/TDAP  02/11/2022  . PNA vac Low Risk Adult  Completed     Discussed health benefits of physical activity, and encouraged him to engage in regular exercise appropriate for his age and condition.  1. Non-ST elevation (NSTEMI) myocardial infarction [facility])  Followed by [facility]. He is currently on 12.5 mg losartan. Beta blocker withheld due to bradycardia. He is on a statin. He is also on ASA and plavix, recent left subclavian stent. Right subclavian and LAD/circumflex are being monitored. Patient is being monitored for angina as well to determine if any interventions are needed.   - Comprehensive Metabolic Panel (CMET)  2. Type 2 diabetes mellitus with stage 3 chronic kidney disease, without long-term current use of insulin (HCC)  Well controlled, on ACEi and statin. Due for eye exam. Does have moderate CKD.   3. Anemia, unspecified type  After catheterization. Hgb is improving, repeat CBC in one month to ensure this is still trending up.   - CBC with Differential - Fe+TIBC+Fer  4. Diabetes mellitus screening  -  HgB A1c  5. Right groin mass  There is a nonpulsatile, nonecchymotic right inguinal mass. [place] on 04/22/2019 was negative for pseudoaneurysm. History is more consistent with hernia. He would like to see Dr. Kirke Corin first before proceeding with surgical consultation. He may not quite yet be a candidate for surgery due to recent left subclavian stent, NSTEMI and need for anticoagulation.   The entirety of the information documented in the History of Present Illness, Review of Systems and Physical Exam were personally obtained by me. Portions of this information were initially documented by Rondel Baton, CMA and reviewed by me for thoroughness and accuracy.   F/u 6 months for diabetes  --------------------------------------------------------------------    Trey Sailors, PA-C  [facility]

## 2019-05-05 LAB — COMPREHENSIVE METABOLIC PANEL
ALT: 32 IU/L (ref 0–44)
AST: 41 IU/L — ABNORMAL HIGH (ref 0–40)
Albumin/Globulin Ratio: 1.3 (ref 1.2–2.2)
Albumin: 4 g/dL (ref 3.7–4.7)
Alkaline Phosphatase: 72 IU/L (ref 39–117)
BUN/Creatinine Ratio: 19 (ref 10–24)
BUN: 26 mg/dL (ref 8–27)
Bilirubin Total: 0.3 mg/dL (ref 0.0–1.2)
CO2: 21 mmol/L (ref 20–29)
Calcium: 9.3 mg/dL (ref 8.6–10.2)
Chloride: 103 mmol/L (ref 96–106)
Creatinine, Ser: 1.36 mg/dL — ABNORMAL HIGH (ref 0.76–1.27)
GFR calc Af Amer: 58 mL/min/{1.73_m2} — ABNORMAL LOW (ref >59)
GFR calc non Af Amer: 51 mL/min/{1.73_m2} — ABNORMAL LOW (ref >59)
Globulin, Total: 3.2 g/dL (ref 1.5–4.5)
Glucose: 110 mg/dL — ABNORMAL HIGH (ref 65–99)
Potassium: 4.3 mmol/L (ref 3.5–5.2)
Sodium: 138 mmol/L (ref 134–144)
Total Protein: 7.2 g/dL (ref 6.0–8.5)

## 2019-05-05 LAB — CBC WITH DIFFERENTIAL/PLATELET
Basophils Absolute: 0 10*3/uL (ref 0.0–0.2)
Basos: 0 %
EOS (ABSOLUTE): 0.3 10*3/uL (ref 0.0–0.4)
Eos: 3 %
Hematocrit: 32.2 % — ABNORMAL LOW (ref 37.5–51.0)
Hemoglobin: 10 g/dL — ABNORMAL LOW (ref 13.0–17.7)
Immature Grans (Abs): 0 10*3/uL (ref 0.0–0.1)
Immature Granulocytes: 0 %
Lymphocytes Absolute: 2.3 10*3/uL (ref 0.7–3.1)
Lymphs: 28 %
MCH: 25.3 pg — ABNORMAL LOW (ref 26.6–33.0)
MCHC: 31.1 g/dL — ABNORMAL LOW (ref 31.5–35.7)
MCV: 81 fL (ref 79–97)
Monocytes Absolute: 0.7 10*3/uL (ref 0.1–0.9)
Monocytes: 8 %
Neutrophils Absolute: 4.8 10*3/uL (ref 1.4–7.0)
Neutrophils: 61 %
Platelets: 220 10*3/uL (ref 150–450)
RBC: 3.96 x10E6/uL — ABNORMAL LOW (ref 4.14–5.80)
RDW: 18.4 % — ABNORMAL HIGH (ref 11.6–15.4)
WBC: 8 10*3/uL (ref 3.4–10.8)

## 2019-05-05 LAB — IRON,TIBC AND FERRITIN PANEL
Ferritin: 22 ng/mL — ABNORMAL LOW (ref 30–400)
Iron Saturation: 68 % — ABNORMAL HIGH (ref 15–55)
Iron: 285 ug/dL (ref 38–169)
Total Iron Binding Capacity: 422 ug/dL (ref 250–450)
UIBC: 137 ug/dL (ref 111–343)

## 2019-05-05 LAB — HEMOGLOBIN A1C
Est. average glucose Bld gHb Est-mCnc: 128 mg/dL
Hgb A1c MFr Bld: 6.1 % — ABNORMAL HIGH (ref 4.8–5.6)

## 2019-05-06 ENCOUNTER — Telehealth: Payer: Self-pay

## 2019-05-06 DIAGNOSIS — I214 Non-ST elevation (NSTEMI) myocardial infarction: Secondary | ICD-10-CM

## 2019-05-06 DIAGNOSIS — D649 Anemia, unspecified: Secondary | ICD-10-CM

## 2019-05-06 DIAGNOSIS — N183 Chronic kidney disease, stage 3 unspecified: Secondary | ICD-10-CM | POA: Insufficient documentation

## 2019-05-06 DIAGNOSIS — E1122 Type 2 diabetes mellitus with diabetic chronic kidney disease: Secondary | ICD-10-CM | POA: Insufficient documentation

## 2019-05-06 NOTE — Telephone Encounter (Signed)
-----   Message from Trinna Post, [place] sent at 05/06/2019 12:56 PM EDT ----- Kidney function is overall stable. A1c looks great at 6.1%. Blood counts are coming up. Please place a CBC with diff in the labs for him to get in one month.

## 2019-05-06 NOTE — Telephone Encounter (Signed)
Patient advised as below. Patient verbalizes understanding and is in agreement with treatment plan.  

## 2019-05-06 NOTE — Telephone Encounter (Signed)
-----   Message from Adriana M Pollak, PA-C sent at 05/06/2019 12:56 PM EDT ----- Kidney function is overall stable. A1c looks great at 6.1%. Blood counts are coming up. Please place a CBC with diff in the labs for him to get in one month. 

## 2019-05-09 ENCOUNTER — Encounter: Payer: Self-pay | Admitting: Physician Assistant

## 2019-05-14 ENCOUNTER — Encounter: Payer: Self-pay | Admitting: Physician Assistant

## 2019-05-14 DIAGNOSIS — R12 Heartburn: Secondary | ICD-10-CM

## 2019-05-14 DIAGNOSIS — E1122 Type 2 diabetes mellitus with diabetic chronic kidney disease: Secondary | ICD-10-CM

## 2019-05-14 DIAGNOSIS — N183 Chronic kidney disease, stage 3 unspecified: Secondary | ICD-10-CM

## 2019-05-14 MED ORDER — JARDIANCE 10 MG PO TABS: 10.0000 mg | ORAL_TABLET | Freq: Every day | ORAL | 0 refills | Status: DC

## 2019-05-14 MED ORDER — PANTOPRAZOLE SODIUM 40 MG PO TBEC: 40.0000 mg | DELAYED_RELEASE_TABLET | Freq: Two times a day (BID) | ORAL | 0 refills | Status: DC

## 2019-05-16 ENCOUNTER — Inpatient Hospital Stay
Admission: EM | Admit: 2019-05-16 | Discharge: 2019-05-19 | DRG: 377 | Disposition: A | Discharge status: Home / Self Care | Payer: Medicare Other | Attending: Internal Medicine | Admitting: Internal Medicine

## 2019-05-16 ENCOUNTER — Encounter: Payer: Self-pay | Admitting: Physician Assistant

## 2019-05-16 ENCOUNTER — Other Ambulatory Visit: Payer: Self-pay

## 2019-05-16 DIAGNOSIS — K920 Hematemesis: Principal | ICD-10-CM | POA: Diagnosis present

## 2019-05-16 DIAGNOSIS — Z7982 Long term (current) use of aspirin: Secondary | ICD-10-CM

## 2019-05-16 DIAGNOSIS — J690 Pneumonitis due to inhalation of food and vomit: Secondary | ICD-10-CM | POA: Diagnosis present

## 2019-05-16 DIAGNOSIS — D72829 Elevated white blood cell count, unspecified: Secondary | ICD-10-CM

## 2019-05-16 DIAGNOSIS — Z8711 Personal history of peptic ulcer disease: Secondary | ICD-10-CM | POA: Diagnosis not present

## 2019-05-16 DIAGNOSIS — K922 Gastrointestinal hemorrhage, unspecified: Secondary | ICD-10-CM

## 2019-05-16 DIAGNOSIS — D500 Iron deficiency anemia secondary to blood loss (chronic): Secondary | ICD-10-CM | POA: Diagnosis not present

## 2019-05-16 DIAGNOSIS — Z8249 Family history of ischemic heart disease and other diseases of the circulatory system: Secondary | ICD-10-CM

## 2019-05-16 DIAGNOSIS — Z7902 Long term (current) use of antithrombotics/antiplatelets: Secondary | ICD-10-CM

## 2019-05-16 DIAGNOSIS — I110 Hypertensive heart disease with heart failure: Secondary | ICD-10-CM | POA: Diagnosis present

## 2019-05-16 DIAGNOSIS — D509 Iron deficiency anemia, unspecified: Secondary | ICD-10-CM | POA: Diagnosis present

## 2019-05-16 DIAGNOSIS — E1151 Type 2 diabetes mellitus with diabetic peripheral angiopathy without gangrene: Secondary | ICD-10-CM | POA: Diagnosis present

## 2019-05-16 DIAGNOSIS — Z1159 Encounter for screening for other viral diseases: Secondary | ICD-10-CM | POA: Diagnosis not present

## 2019-05-16 DIAGNOSIS — I350 Nonrheumatic aortic (valve) stenosis: Secondary | ICD-10-CM | POA: Diagnosis present

## 2019-05-16 DIAGNOSIS — I2510 Atherosclerotic heart disease of native coronary artery without angina pectoris: Secondary | ICD-10-CM | POA: Diagnosis present

## 2019-05-16 DIAGNOSIS — I5022 Chronic systolic (congestive) heart failure: Secondary | ICD-10-CM | POA: Diagnosis present

## 2019-05-16 DIAGNOSIS — I255 Ischemic cardiomyopathy: Secondary | ICD-10-CM | POA: Diagnosis present

## 2019-05-16 DIAGNOSIS — Z87891 Personal history of nicotine dependence: Secondary | ICD-10-CM | POA: Diagnosis not present

## 2019-05-16 LAB — CBC WITH DIFFERENTIAL/PLATELET
Abs Immature Granulocytes: 0.1 10*3/uL — ABNORMAL HIGH (ref 0.00–0.07)
Basophils Absolute: 0 10*3/uL (ref 0.0–0.1)
Basophils Relative: 0 %
Eosinophils Absolute: 0 10*3/uL (ref 0.0–0.5)
Eosinophils Relative: 0 %
HCT: 35.9 % — ABNORMAL LOW (ref 39.0–52.0)
Hemoglobin: 11 g/dL — ABNORMAL LOW (ref 13.0–17.0)
Immature Granulocytes: 1 %
Lymphocytes Relative: 6 %
Lymphs Abs: 1 10*3/uL (ref 0.7–4.0)
MCH: 25.2 pg — ABNORMAL LOW (ref 26.0–34.0)
MCHC: 30.6 g/dL (ref 30.0–36.0)
MCV: 82.3 fL (ref 80.0–100.0)
Monocytes Absolute: 1.3 10*3/uL — ABNORMAL HIGH (ref 0.1–1.0)
Monocytes Relative: 8 %
Neutro Abs: 14 10*3/uL — ABNORMAL HIGH (ref 1.7–7.7)
Neutrophils Relative %: 85 %
Platelets: 232 10*3/uL (ref 150–400)
RBC: 4.36 MIL/uL (ref 4.22–5.81)
RDW: 22.1 % — ABNORMAL HIGH (ref 11.5–15.5)
WBC: 16.4 10*3/uL — ABNORMAL HIGH (ref 4.0–10.5)
nRBC: 0 % (ref 0.0–0.2)

## 2019-05-16 LAB — COMPREHENSIVE METABOLIC PANEL
ALT: 31 U/L (ref 0–44)
AST: 39 U/L (ref 15–41)
Albumin: 3.7 g/dL (ref 3.5–5.0)
Alkaline Phosphatase: 65 U/L (ref 38–126)
Anion gap: 8 (ref 5–15)
BUN: 26 mg/dL — ABNORMAL HIGH (ref 8–23)
CO2: 25 mmol/L (ref 22–32)
Calcium: 9.3 mg/dL (ref 8.9–10.3)
Chloride: 104 mmol/L (ref 98–111)
Creatinine, Ser: 1.23 mg/dL (ref 0.61–1.24)
GFR calc Af Amer: >60 mL/min (ref >60)
GFR calc non Af Amer: 57 mL/min — ABNORMAL LOW (ref >60)
Glucose, Bld: 171 mg/dL — ABNORMAL HIGH (ref 70–99)
Potassium: 3.8 mmol/L (ref 3.5–5.1)
Sodium: 137 mmol/L (ref 135–145)
Total Bilirubin: 0.8 mg/dL (ref 0.3–1.2)
Total Protein: 7.9 g/dL (ref 6.5–8.1)

## 2019-05-16 LAB — OCCULT BLOOD GASTRIC / DUODENUM (SPECIMEN CUP)
Occult Blood, Gastric: POSITIVE — AB
pH, Gastric: 2

## 2019-05-16 LAB — LIPASE, BLOOD: Lipase: 29 U/L (ref 11–51)

## 2019-05-16 LAB — TYPE AND SCREEN
ABO/RH(D): A POS
Antibody Screen: NEGATIVE

## 2019-05-16 LAB — PROTIME-INR
INR: 1.2 (ref 0.8–1.2)
Prothrombin Time: 15 seconds (ref 11.4–15.2)

## 2019-05-16 LAB — SARS CORONAVIRUS 2 BY RT PCR (HOSPITAL ORDER, PERFORMED IN ~~LOC~~ HOSPITAL LAB): SARS Coronavirus 2: NEGATIVE

## 2019-05-16 LAB — GLUCOSE, CAPILLARY: Glucose-Capillary: 139 mg/dL — ABNORMAL HIGH (ref 70–99)

## 2019-05-16 LAB — APTT: aPTT: 29 seconds (ref 24–36)

## 2019-05-16 MED ORDER — ATORVASTATIN CALCIUM 20 MG PO TABS
80.0000 mg | ORAL_TABLET | Freq: Every day | ORAL | Status: DC
  Administered 2019-05-16 – 2019-05-18 (×3): 80 mg via ORAL
  Filled 2019-05-16 (×3): qty 4

## 2019-05-16 MED ORDER — FERROUS SULFATE 325 (65 FE) MG PO TABS
325.0000 mg | ORAL_TABLET | Freq: Every day | ORAL | Status: DC
  Administered 2019-05-17 – 2019-05-19 (×2): 325 mg via ORAL
  Filled 2019-05-16 (×3): qty 1

## 2019-05-16 MED ORDER — SODIUM CHLORIDE 0.9 % IV SOLN
1000.0000 mL | Freq: Once | INTRAVENOUS | Status: AC
  Administered 2019-05-16: 17:00:00 1000 mL via INTRAVENOUS

## 2019-05-16 MED ORDER — VITAMIN B-12 100 MCG PO TABS
100.0000 ug | ORAL_TABLET | Freq: Every day | ORAL | Status: DC
  Administered 2019-05-16 – 2019-05-17 (×2): 100 ug via ORAL
  Filled 2019-05-16 (×3): qty 1

## 2019-05-16 MED ORDER — TRAMADOL HCL 50 MG PO TABS
50.0000 mg | ORAL_TABLET | Freq: Four times a day (QID) | ORAL | Status: DC | PRN
  Administered 2019-05-17 – 2019-05-19 (×4): 50 mg via ORAL
  Filled 2019-05-16 (×5): qty 1

## 2019-05-16 MED ORDER — ACETAMINOPHEN 325 MG PO TABS: 650.0000 mg | ORAL_TABLET | Freq: Four times a day (QID) | ORAL | Status: DC | PRN

## 2019-05-16 MED ORDER — ACETAMINOPHEN 650 MG RE SUPP: 650.0000 mg | Freq: Four times a day (QID) | RECTAL | Status: DC | PRN

## 2019-05-16 MED ORDER — FUROSEMIDE 20 MG PO TABS
10.0000 mg | ORAL_TABLET | Freq: Every day | ORAL | Status: DC
  Administered 2019-05-17 – 2019-05-18 (×2): 10 mg via ORAL
  Filled 2019-05-16 (×2): qty 1

## 2019-05-16 MED ORDER — MELATONIN 5 MG PO TABS
10.0000 mg | ORAL_TABLET | Freq: Every day | ORAL | Status: DC
  Administered 2019-05-16 – 2019-05-18 (×3): 10 mg via ORAL
  Filled 2019-05-16 (×4): qty 2

## 2019-05-16 MED ORDER — OMEGA-3-ACID ETHYL ESTERS 1 G PO CAPS
1.0000 g | ORAL_CAPSULE | Freq: Every day | ORAL | Status: DC
  Administered 2019-05-17 – 2019-05-19 (×3): 1 g via ORAL
  Filled 2019-05-16 (×3): qty 1

## 2019-05-16 MED ORDER — PANTOPRAZOLE SODIUM 40 MG IV SOLR
40.0000 mg | Freq: Two times a day (BID) | INTRAVENOUS | Status: DC
  Administered 2019-05-16 – 2019-05-19 (×6): 40 mg via INTRAVENOUS
  Filled 2019-05-16 (×6): qty 40

## 2019-05-16 MED ORDER — INSULIN ASPART 100 UNIT/ML ~~LOC~~ SOLN: 0.0000 [IU] | Freq: Every day | SUBCUTANEOUS | Status: DC

## 2019-05-16 MED ORDER — INSULIN ASPART 100 UNIT/ML ~~LOC~~ SOLN
0.0000 [IU] | Freq: Three times a day (TID) | SUBCUTANEOUS | Status: DC
  Administered 2019-05-17 – 2019-05-18 (×3): 2 [IU] via SUBCUTANEOUS
  Filled 2019-05-16 (×4): qty 1

## 2019-05-16 MED ORDER — SODIUM CHLORIDE 0.9 % IV SOLN
INTRAVENOUS | Status: DC
  Administered 2019-05-16 – 2019-05-18 (×3): via INTRAVENOUS

## 2019-05-16 MED ORDER — PANTOPRAZOLE SODIUM 40 MG IV SOLR
40.0000 mg | Freq: Once | INTRAVENOUS | Status: AC
  Administered 2019-05-16: 40 mg via INTRAVENOUS
  Filled 2019-05-16: qty 40

## 2019-05-16 MED ORDER — ONDANSETRON HCL 4 MG PO TABS: 4.0000 mg | ORAL_TABLET | Freq: Four times a day (QID) | ORAL | Status: DC | PRN

## 2019-05-16 MED ORDER — ADULT MULTIVITAMIN W/MINERALS CH
1.0000 | ORAL_TABLET | Freq: Every day | ORAL | Status: DC
  Administered 2019-05-17 – 2019-05-19 (×3): 1 via ORAL
  Filled 2019-05-16 (×3): qty 1

## 2019-05-16 MED ORDER — ONDANSETRON HCL 4 MG/2ML IJ SOLN
4.0000 mg | Freq: Four times a day (QID) | INTRAMUSCULAR | Status: DC | PRN
  Administered 2019-05-17 – 2019-05-18 (×2): 4 mg via INTRAVENOUS
  Filled 2019-05-16 (×2): qty 2

## 2019-05-16 MED ORDER — ONDANSETRON HCL 4 MG/2ML IJ SOLN
4.0000 mg | Freq: Once | INTRAMUSCULAR | Status: AC
  Administered 2019-05-16: 4 mg via INTRAVENOUS
  Filled 2019-05-16: qty 2

## 2019-05-16 NOTE — ED Notes (Signed)
Pt found in saturated bed at this time. Pt cleaned and linen changed by this RN.

## 2019-05-16 NOTE — ED Provider Notes (Signed)
[facility] Emergency Department Provider Note   ____________________________________________    I have reviewed the triage vital signs and the nursing notes.   HISTORY  Chief Complaint Emesis     HPI Kevin Case is a 78 y.o. male who presents with complaints of nausea and vomiting.  Patient reports symptoms started yesterday morning and he has been vomiting quite frequently.  He reports he vomited several times this morning although not once in the last several hours.  He denies abdominal pain.  No significant diarrhea.  No fevers or chills.  No body aches.  Review of record demonstrates a history of a GI bleed in the past, he is apparently on Plavix.  Past Medical History:  Diagnosis Date  . Anemia   . Blood transfusion without reported diagnosis   . CAD (coronary artery disease)    a. 03/2019 Cath: LM nl, LAD 50p CA2+, 6m, D1 50ost, LCX 20p, 58m, RCA small, mild diff dzs-->Med rx. Consider PCI LCX for refractory angina.  . Carotid arterial disease ([place])    a. 03/2019 Carotid U/S: RICA 4-03%, RECA <47%, LICA 4-25%, LECA <95%.  . CHF (congestive heart failure) ([place])   . Clotting disorder ([place])   . Diabetes mellitus without complication ([place])   . GIB (gastrointestinal bleeding)    a. 03/2019 BRBPR following PV procedure req anticoagulation-->2u PRBCs.  . Heart murmur   . HFrEF (heart failure with reduced ejection fraction) ([place])    a. 03/2019 Echo: EF 30-35%, mild conc LVH. Mildly dil LA. Mod MV annular dil. Mod AS (mean grad 49mmHg, Valve area 0.76).  Marland Kitchen History of tobacco abuse   . Hypertension   . Ischemic cardiomyopathy    a. 03/2019 Echo: EF 30-35%.  . Left bundle branch block   . Moderate aortic stenosis    a. 03/2019 Echo:  Mod AS (mean grad 34mmHg, Valve area 0.76).  Marland Kitchen PAD (peripheral artery disease) ([place])    a. 03/2019 PV Angio: RCFA 80, RSFA 100.  . Rib fracture   . Sinus bradycardia   . Subclavian arterial stenosis ([place])    a.  03/2019 PV Angio: RSCA 90 after origin of CCA. RCCA 30ost, LSCA 80 (PTA and covered stenting).    Patient Active Problem List   Diagnosis Date Noted  . Type 2 diabetes mellitus with stage 3 chronic kidney disease, without long-term current use of insulin ([place]) 05/06/2019  . GI bleed 04/02/2019  . Hypotension 04/02/2019  . Stenosis of left subclavian artery ([place]) 03/31/2019  . Non-ST elevation (NSTEMI) myocardial infarction ([place])   . Acute systolic heart failure ([place])   . Acute respiratory failure with hypoxia ([place]) 03/12/2019    Past Surgical History:  Procedure Laterality Date  . AORTIC ARCH ANGIOGRAPHY N/A 03/31/2019   Procedure: AORTIC ARCH ANGIOGRAPHY;  Surgeon: Wellington Hampshire, MD;  Location: [place] [facility];  Service: Cardiovascular;  Laterality: N/A;  . CARDIAC CATHETERIZATION    . PERIPHERAL VASCULAR INTERVENTION Left 03/31/2019   Procedure: PERIPHERAL VASCULAR INTERVENTION;  Surgeon: Wellington Hampshire, MD;  Location: [place] [facility];  Service: Cardiovascular;  Laterality: Left;  . RIGHT/LEFT HEART CATH AND CORONARY ANGIOGRAPHY N/A 03/15/2019   Procedure: RIGHT/LEFT HEART CATH AND CORONARY ANGIOGRAPHY;  Surgeon: Wellington Hampshire, MD;  Location: [place] [facility];  Service: Cardiovascular;  Laterality: N/A;    Prior to Admission medications   Medication Sig Start Date End Date Taking? Authorizing Provider  aspirin 81 MG chewable tablet Chew 1  tablet (81 mg total) by mouth daily. 03/17/19  Yes Salary, Evelena AsaMontell D, MD  atorvastatin (LIPITOR) 80 MG tablet Take 80 mg by mouth at bedtime.  01/05/19  Yes [provider]  clopidogrel (PLAVIX) 75 MG tablet Take 1 tablet (75 mg total) by mouth daily. 03/25/19  Yes Iran OuchArida, Muhammad A, MD  cyanocobalamin 100 MCG tablet Take 100 mcg by mouth daily.   Yes [provider]  docusate sodium (COLACE) 100 MG capsule Take 100 mg by mouth daily.    Yes [provider]  ferrous sulfate 325 (65 FE) MG tablet Take 1 tablet  (325 mg total) by mouth daily with breakfast. 04/12/19  Yes Creig HinesBerge, Christopher Ronald, NP  furosemide (LASIX) 20 MG tablet Take 0.5 tablets (10 mg total) by mouth daily. 04/12/19 07/11/19 Yes Creig HinesBerge, Christopher Ronald, NP  JARDIANCE 10 MG TABS tablet Take 10 mg by mouth daily. 05/14/19 08/12/19 Yes Pollak, Lavella HammockAdriana M, PA-C  losartan (COZAAR) 25 MG tablet Take 0.5 tablets (12.5 mg total) by mouth daily. 04/09/19 07/08/19 Yes Creig HinesBerge, Christopher Ronald, NP  Melatonin 10 MG TABS Take 10 mg by mouth at bedtime.    Yes [provider]  Multiple Vitamin (MULTIVITAMIN WITH MINERALS) TABS tablet Take 1 tablet by mouth daily. Multivitamin for Senior 50+   Yes [provider]  Omega-3 Fatty Acids (FISH OIL) 1000 MG CAPS Take 1,000 mg by mouth daily.   Yes [provider]  pantoprazole (PROTONIX) 40 MG tablet Take 1 tablet (40 mg total) by mouth 2 (two) times daily. 05/14/19 08/12/19 Yes Trey SailorsPollak, Adriana M, PA-C  acetaminophen (TYLENOL) 500 MG tablet Take 1,000 mg by mouth every 6 (six) hours as needed (headache.).    [provider]  traMADol (ULTRAM) 50 MG tablet Take 1 tablet (50 mg total) by mouth every 6 (six) hours as needed. Patient taking differently: Take 50 mg by mouth every 6 (six) hours as needed (pain).  03/16/19   Salary, Evelena AsaMontell D, MD     Allergies Patient has no known allergies.  Family History  Problem Relation Age of Onset  . Congestive Heart Failure Father        had AICD placed.   . Dementia Father   . Dementia Mother     Social History Social History   Tobacco Use  . Smoking status: Former Smoker    Packs/day: 1.50    Years: 57.00    Pack years: 85.50    Types: Cigarettes  . Smokeless tobacco: Never Used  Substance Use Topics  . Alcohol use: No    Frequency: Never  . Drug use: Never    Review of Systems  Constitutional: No fever/chills Eyes: No visual changes.  ENT: No sore throat. Cardiovascular: Denies chest pain. Respiratory: Denies  shortness of breath. Gastrointestinal: As above Genitourinary: Negative for dysuria. Musculoskeletal: Negative for back pain. Skin: Negative for rash. Neurological: Negative for headaches or weakness   ____________________________________________   PHYSICAL EXAM:  VITAL SIGNS: ED Triage Vitals  Enc Vitals Group     BP 05/16/19 1632 (!) 141/78     Pulse Rate 05/16/19 1632 81     Resp 05/16/19 1632 (!) 25     Temp 05/16/19 1632 98.5 F (36.9 C)     Temp Source 05/16/19 1632 Oral     SpO2 05/16/19 1632 100 %     Weight 05/16/19 1630 62.1 kg (137 lb)     Height 05/16/19 1630 1.753 m (5\' 9" )     Head Circumference --  Peak Flow --      Pain Score 05/16/19 1630 0     Pain Loc --      Pain Edu? --      Excl. in GC? --     Constitutional: Alert and oriented.  Eyes: Conjunctivae are normal.   Nose: No congestion/rhinnorhea. Mouth/Throat: Mucous membranes are dry Neck:  Painless ROM Cardiovascular: Normal rate, regular rhythm. Grossly normal heart sounds.  Good peripheral circulation. Respiratory: Normal respiratory effort.  No retractions. Lungs CTAB. Gastrointestinal: Soft and nontender. No distention.  Reassuring abdominal exam  Musculoskeletal:  Warm and well perfused Neurologic:  Normal speech and language. No gross focal neurologic deficits are appreciated.  Skin:  Skin is warm, dry and intact. No rash noted. Psychiatric: Mood and affect are normal. Speech and behavior are normal.  ____________________________________________   LABS (all labs ordered are listed, but only abnormal results are displayed)  Labs Reviewed  COMPREHENSIVE METABOLIC PANEL - Abnormal; Notable for the following components:      Result Value   Glucose, Bld 171 (*)    BUN 26 (*)    GFR calc non Af Amer 57 (*)    All other components within normal limits  CBC WITH DIFFERENTIAL/PLATELET - Abnormal; Notable for the following components:   WBC 16.4 (*)    Hemoglobin 11.0 (*)    HCT 35.9  (*)    MCH 25.2 (*)    RDW 22.1 (*)    Neutro Abs 14.0 (*)    Monocytes Absolute 1.3 (*)    Abs Immature Granulocytes 0.10 (*)    All other components within normal limits  OCCULT BLOOD GASTRIC / DUODENUM (SPECIMEN CUP) - Abnormal; Notable for the following components:   Occult Blood, Gastric POSITIVE (*)    All other components within normal limits  SARS CORONAVIRUS 2 (HOSPITAL ORDER, PERFORMED IN [facility])  LIPASE, BLOOD  APTT  PROTIME-INR  POCT GASTRIC OCCULT BLOOD (1-CARD TO LAB)  TYPE AND SCREEN   ____________________________________________  EKG  None ____________________________________________  RADIOLOGY  None ____________________________________________   PROCEDURES  Procedure(s) performed: No  Procedures   Critical Care performed: No ____________________________________________   INITIAL IMPRESSION / ASSESSMENT AND PLAN / ED COURSE  Pertinent labs & imaging results that were available during my care of the patient were reviewed by me and considered in my medical decision making (see chart for details).  Patient presents with vomiting greater than 24 hours, he appears dehydrated feels weak.  Will give IV fluids, IV Zofran check labs and reevaluate.  Patient had a large reddish vomitus all over his room, suspicious for upper GI bleed, confirmed by lab.  We will add IV Protonix, have discussed with hospitalist for admission    ____________________________________________   FINAL CLINICAL IMPRESSION(S) / ED DIAGNOSES  Final diagnoses:  Upper GI bleed        Note:  This document was prepared using Dragon voice recognition software and may include unintentional dictation errors.   Jene Every, MD 05/16/19 1859

## 2019-05-16 NOTE — ED Triage Notes (Signed)
Pt arrived via ems from home for report of N/V x3 days - reports vomiting x3 in last 24 hours - denies any pain

## 2019-05-16 NOTE — ED Notes (Signed)
Pt spouse stated that pt had been confused today and attempting to put both legs in one pant hole and vomiting every 20 minutes and unable to sit up/stand

## 2019-05-16 NOTE — ED Notes (Signed)
Pt vomited large amount of red/orange emesis - he denies eating or drinking anything red/orange in color - sample obtained to send to lab and Dr Corky Downs notified with new orders placed

## 2019-05-16 NOTE — H&P (Signed)
[facility] [place] [facility]    PATIENT NAME: Kevin Case    MR#:  [id number]  DATE OF BIRTH:  04/30/1941  DATE OF ADMISSION:  05/16/2019  PRIMARY CARE PHYSICIAN: Trey Sailors, PA-C   REQUESTING/REFERRING PHYSICIAN: Dr. Jene Every  CHIEF COMPLAINT:   Chief Complaint  Patient presents with  . Emesis    HISTORY OF PRESENT ILLNESS:  Kevin Case  is a 78 y.o. male with a known history of coronary artery disease, carotid artery disease, diabetes, previous history of GI bleeding ischemic cardiomyopathy ejection fraction of 30 to 35%, aortic stenosis, peripheral artery disease, previous history of subclavian stenosis who presents to the hospital due to multiple episodes of nausea and vomiting and the vomiting turning more bloody/bright red in color over the past 24 hours.  Patient says he has been having intractable nausea vomiting for the past 2 to 3 days and today his vomiting was more blood-tinged and therefore he was concerned and came to the ER for further evaluation.  Patient had his vomitus in the ER hemocculted and it was positive.  Patient denies any abdominal pain, fever chills cough chest pain shortness of breath or any other associated symptoms.  Given patient's persistent nausea vomiting and suspicious for having upper GI bleed hospital services were contacted for admission.  Patient denies any recent travel history, fever, cough, loss of taste or any other associated symptoms.  Patient's COVID-19 test still pending.  PAST MEDICAL HISTORY:   Past Medical History:  Diagnosis Date  . Anemia   . Blood transfusion without reported diagnosis   . CAD (coronary artery disease)    a. 03/2019 Cath: LM nl, LAD 50p CA2+, 70m, D1 50ost, LCX 20p, 75m, RCA small, mild diff dzs-->Med rx. Consider PCI LCX for refractory angina.  . Carotid arterial disease ([facility])    a. 03/2019 Carotid U/S: RICA 1-39%, RECA <50%, LICA 1-39%, LECA <50%.  . CHF (congestive heart  failure) ([facility])   . Clotting disorder ([facility])   . Diabetes mellitus without complication ([facility])   . GIB (gastrointestinal bleeding)    a. 03/2019 BRBPR following PV procedure req anticoagulation-->2u PRBCs.  . Heart murmur   . HFrEF (heart failure with reduced ejection fraction) ([facility])    a. 03/2019 Echo: EF 30-35%, mild conc LVH. Mildly dil LA. Mod MV annular dil. Mod AS (mean grad , Valve area 0.76).  Marland Kitchen History of tobacco abuse   . Hypertension   . Ischemic cardiomyopathy    a. 03/2019 Echo: EF 30-35%.  . Left bundle branch block   . Moderate aortic stenosis    a. 03/2019 Echo:  Mod AS (mean grad , Valve area 0.76).  Marland Kitchen PAD (peripheral artery disease) ([facility])    a. 03/2019 PV Angio: RCFA 80, RSFA 100.  . Rib fracture   . Sinus bradycardia   . Subclavian arterial stenosis ([facility])    a. 03/2019 PV Angio: RSCA 90 after origin of CCA. RCCA 30ost, LSCA 80 (PTA and covered stenting).    PAST SURGICAL HISTORY:   Past Surgical History:  Procedure Laterality Date  . AORTIC ARCH ANGIOGRAPHY N/A 03/31/2019   Procedure: AORTIC ARCH ANGIOGRAPHY;  Surgeon: Iran Ouch, MD;  [facility]: [facility];  Service: Cardiovascular;  Laterality: N/A;  . CARDIAC CATHETERIZATION    . PERIPHERAL VASCULAR INTERVENTION Left 03/31/2019   Procedure: PERIPHERAL VASCULAR INTERVENTION;  Surgeon: Iran Ouch, MD;  Location: [facility];  Service: Cardiovascular;  Laterality: Left;  .  RIGHT/LEFT HEART CATH AND CORONARY ANGIOGRAPHY N/A 03/15/2019   Procedure: RIGHT/LEFT HEART CATH AND CORONARY ANGIOGRAPHY;  Surgeon: Iran OuchArida, Muhammad A, MD;  Location: [facility];  Service: Cardiovascular;  Laterality: N/A;    SOCIAL HISTORY:   Social History   Tobacco Use  . Smoking status: Former Smoker    Packs/day: 1.50    Years: 57.00    Pack years: 85.50    Types: Cigarettes  . Smokeless tobacco: Never Used  Substance Use Topics  . Alcohol use: No    Frequency: Never    FAMILY HISTORY:    Family History  Problem Relation Age of Onset  . Congestive Heart Failure Father        had AICD placed.   . Dementia Father   . Dementia Mother     DRUG ALLERGIES:  No Known Allergies  REVIEW OF SYSTEMS:   Review of Systems  Constitutional: Negative for fever and weight loss.  HENT: Negative for congestion, nosebleeds and tinnitus.   Eyes: Negative for blurred vision, double vision and redness.  Respiratory: Negative for cough, hemoptysis and shortness of breath.   Cardiovascular: Negative for chest pain, orthopnea, leg swelling and PND.  Gastrointestinal: Positive for nausea and vomiting. Negative for abdominal pain, diarrhea and melena.  Genitourinary: Negative for dysuria, hematuria and urgency.  Musculoskeletal: Negative for falls and joint pain.  Neurological: Negative for dizziness, tingling, sensory change, focal weakness, seizures, weakness and headaches.  Endo/Heme/Allergies: Negative for polydipsia. Does not bruise/bleed easily.  Psychiatric/Behavioral: Negative for depression and memory loss. The patient is not nervous/anxious.     MEDICATIONS AT HOME:   Prior to Admission medications   Medication Sig Start Date End Date Taking? Authorizing Provider  aspirin 81 MG chewable tablet Chew 1 tablet (81 mg total) by mouth daily. 03/17/19  Yes Salary, Evelena AsaMontell D, MD  atorvastatin (LIPITOR) 80 MG tablet Take 80 mg by mouth at bedtime.  01/05/19  Yes [provider]  clopidogrel (PLAVIX) 75 MG tablet Take 1 tablet (75 mg total) by mouth daily. 03/25/19  Yes Iran OuchArida, Muhammad A, MD  cyanocobalamin 100 MCG tablet Take 100 mcg by mouth daily.   Yes [provider]  docusate sodium (COLACE) 100 MG capsule Take 100 mg by mouth daily.    Yes [provider]  ferrous sulfate 325 (65 FE) MG tablet Take 1 tablet (325 mg total) by mouth daily with breakfast. 04/12/19  Yes Creig HinesBerge, Christopher Ronald, NP  furosemide (LASIX) 20 MG tablet Take 0.5 tablets (10 mg total)  by mouth daily. 04/12/19 07/11/19 Yes Creig HinesBerge, Christopher Ronald, NP  JARDIANCE 10 MG TABS tablet Take 10 mg by mouth daily. 05/14/19 08/12/19 Yes Pollak, Lavella HammockAdriana M, PA-C  losartan (COZAAR) 25 MG tablet Take 0.5 tablets (12.5 mg total) by mouth daily. 04/09/19 07/08/19 Yes Creig HinesBerge, Christopher Ronald, NP  Melatonin 10 MG TABS Take 10 mg by mouth at bedtime.    Yes [provider]  Multiple Vitamin (MULTIVITAMIN WITH MINERALS) TABS tablet Take 1 tablet by mouth daily. Multivitamin for Senior 50+   Yes [provider]  Omega-3 Fatty Acids (FISH OIL) 1000 MG CAPS Take 1,000 mg by mouth daily.   Yes [provider]  pantoprazole (PROTONIX) 40 MG tablet Take 1 tablet (40 mg total) by mouth 2 (two) times daily. 05/14/19 08/12/19 Yes Trey SailorsPollak, Adriana M, PA-C  acetaminophen (TYLENOL) 500 MG tablet Take 1,000 mg by mouth every 6 (six) hours as needed (headache.).    [provider]  traMADol (ULTRAM) 50 MG tablet Take 1 tablet (50 mg total) by mouth every 6 (six) hours as needed. Patient taking differently: Take 50 mg by mouth every 6 (six) hours as needed (pain).  03/16/19   Salary, Evelena AsaMontell D, MD      VITAL SIGNS:  Blood pressure (!) 141/78, pulse 81, temperature 98.5 F (36.9 C), temperature source Oral, resp. rate (!) 25, height 5\' 9"  (1.753 m), weight 62.1 kg, SpO2 100 %.  PHYSICAL EXAMINATION:  Physical Exam  GENERAL:  78 y.o.-year-old patient lying in the bed in no acute distress.  EYES: Pupils equal, round, reactive to light and accommodation. No scleral icterus. Extraocular muscles intact.  HEENT: Head atraumatic, normocephalic. Oropharynx and nasopharynx clear. No oropharyngeal erythema, moist oral mucosa  NECK:  Supple, no jugular venous distention. No thyroid enlargement, no tenderness.  LUNGS: Normal breath sounds bilaterally, no wheezing, rales, rhonchi. No use of accessory muscles of respiration.  CARDIOVASCULAR: S1, S2 RRR. II/VI SEM at LSB, No rubs, gallops, clicks.   ABDOMEN: Soft, nontender, nondistended. Bowel sounds present. No organomegaly or mass.  EXTREMITIES: No pedal edema, cyanosis, or clubbing. + 2 pedal & radial pulses b/l.   NEUROLOGIC: Cranial nerves II through XII are intact. No focal Motor or sensory deficits appreciated b/l PSYCHIATRIC: The patient is alert and oriented x 3.  SKIN: No obvious rash, lesion, or ulcer.   LABORATORY PANEL:   CBC Recent Labs  Lab 05/16/19 1632  WBC 16.4*  HGB 11.0*  HCT 35.9*  PLT 232   ------------------------------------------------------------------------------------------------------------------  Chemistries  Recent Labs  Lab 05/16/19 1632  NA 137  K 3.8  CL 104  CO2 25  GLUCOSE 171*  BUN 26*  CREATININE 1.23  CALCIUM 9.3  AST 39  ALT 31  ALKPHOS 65  BILITOT 0.8   ------------------------------------------------------------------------------------------------------------------  Cardiac Enzymes No results for input(s): TROPONINI in the last 168 hours. ------------------------------------------------------------------------------------------------------------------  RADIOLOGY:  No results found.   IMPRESSION AND PLAN:   78 y.o. male with a known history of coronary artery disease, carotid artery disease, diabetes, previous history of GI bleeding ischemic cardiomyopathy ejection fraction of 30 to 35%, aortic stenosis, peripheral artery disease, previous history of subclavian stenosis who presents to the hospital due to multiple episodes of nausea and vomiting and the vomiting turning more bloody/bright red in color over the past 24 hours  1.  GI bleed-suspected to be upper GI bleed given patient's bright red blood/mild hematemesis. - Patient has no history of liver disease.  Patient is on aspirin and Plavix which I will hold.  Hemoglobin is currently stable. - Placed on Protonix twice daily, keep n.p.o. for now, give IV fluids and transfuse if needed. - We will get  gastroenterology consult.  2.  Essential hypertension-patient's blood pressure is a bit on the low side.  Hold his antihypertensives for now.  3.  Diabetes-hold patient's Jardiance. -Will place on sliding scale insulin for now given the patient's GI bleed.  4.  History of peripheral vascular disease/carotid artery disease- continue atorvastatin.  Hold aspirin and Plavix given the GI bleed.  5.  History of chronic systolic CHF-clinically patient is not in congestive heart failure. -Continue Lasix.    All the records are reviewed and case discussed with ED provider. Management plans discussed with the patient, family and they are in agreement.  CODE STATUS: Full code  TOTAL TIME TAKING CARE OF THIS PATIENT: 45 minutes.    Houston SirenVivek J England Greb M.D on 05/16/2019 at 6:55 PM  Between  7am to 6pm - Pager - [phone]  After 6pm go to [email or web address] - password [email or web address] [facility]  Office  [phone]  CC: Primary care physician; Trinna Post, PA-C

## 2019-05-16 NOTE — ED Notes (Addendum)
Called to pt room for pt c/o nausea and vomiting - went to pick emesis bag up to hand to pt and he exclaimed not to hand that to him that he had "peed" in the emesis bag - new emesis bag given  Pt vomited moderate amount of reddish emesis

## 2019-05-16 NOTE — ED Notes (Addendum)
Pt gave permission to speak to his wife Charlynn Grimes [phone] Pt spouse updated on pt condition and plan of care

## 2019-05-16 NOTE — ED Notes (Signed)
ED TO INPATIENT HANDOFF REPORT  ED Nurse Name and Phone #: Wells Guiles 32  S Name/Age/Gender Kevin Case 78 y.o. male Room/Bed: ED09A/ED09A  Code Status   Code Status: Prior  Home/SNF/Other Home Patient oriented to: self, place and situation Is this baseline? No   Triage Complete: Triage complete  Chief Complaint nv  Triage Note Pt arrived via ems from home for report of N/V x3 days - reports vomiting x3 in last 24 hours - denies any pain    Allergies No Known Allergies  Level of Care/Admitting Diagnosis ED Disposition    ED Disposition Condition [facility] Area: [place] [[id number]]  Level of Care: Med-Surg [16]  Covid Evaluation: Person Under Investigation (PUI)  Diagnosis: GI bleed [588502]  Admitting Physician: Henreitta Leber [[id number]]  Attending Physician: Henreitta Leber [[id number]]  Estimated length of stay: past midnight tomorrow  Certification:: I certify this patient will need inpatient services for at least 2 midnights  PT Class (Do Not Modify): Inpatient [101]  PT Acc Code (Do Not Modify): Private [1]       B Medical/Surgery History Past Medical History:  Diagnosis Date  . Anemia   . Blood transfusion without reported diagnosis   . CAD (coronary artery disease)    a. 03/2019 Cath: LM nl, LAD 50p CA2+, 37m, D1 50ost, LCX 20p, 25m, RCA small, mild diff dzs-->Med rx. Consider PCI LCX for refractory angina.  . Carotid arterial disease ([place])    a. 03/2019 Carotid U/S: RICA 2-09%, RECA <47%, LICA 0-96%, LECA <28%.  . CHF (congestive heart failure) ([place])   . Clotting disorder ([place])   . Diabetes mellitus without complication ([place])   . GIB (gastrointestinal bleeding)    a. 03/2019 BRBPR following PV procedure req anticoagulation-->2u PRBCs.  . Heart murmur   . HFrEF (heart failure with reduced ejection fraction) ([place])    a. 03/2019 Echo: EF 30-35%, mild conc LVH. Mildly dil LA. Mod MV annular dil. Mod AS (mean grad  62mmHg, Valve area 0.76).  Marland Kitchen History of tobacco abuse   . Hypertension   . Ischemic cardiomyopathy    a. 03/2019 Echo: EF 30-35%.  . Left bundle branch block   . Moderate aortic stenosis    a. 03/2019 Echo:  Mod AS (mean grad 62mmHg, Valve area 0.76).  Marland Kitchen PAD (peripheral artery disease) ([place])    a. 03/2019 PV Angio: RCFA 80, RSFA 100.  . Rib fracture   . Sinus bradycardia   . Subclavian arterial stenosis ([place])    a. 03/2019 PV Angio: RSCA 90 after origin of CCA. RCCA 30ost, LSCA 80 (PTA and covered stenting).   Past Surgical History:  Procedure Laterality Date  . AORTIC ARCH ANGIOGRAPHY N/A 03/31/2019   Procedure: AORTIC ARCH ANGIOGRAPHY;  Surgeon: Wellington Hampshire, MD;  Location: [facility];  Service: Cardiovascular;  Laterality: N/A;  . CARDIAC CATHETERIZATION    . PERIPHERAL VASCULAR INTERVENTION Left 03/31/2019   Procedure: PERIPHERAL VASCULAR INTERVENTION;  Surgeon: Wellington Hampshire, MD;  Location: [facility];  Service: Cardiovascular;  Laterality: Left;  . RIGHT/LEFT HEART CATH AND CORONARY ANGIOGRAPHY N/A 03/15/2019   Procedure: RIGHT/LEFT HEART CATH AND CORONARY ANGIOGRAPHY;  Surgeon: Wellington Hampshire, MD;  Location: [facility];  Service: Cardiovascular;  Laterality: N/A;     A IV Location/Drains/Wounds Patient Lines/Drains/Airways Status   Active Line/Drains/Airways    Name:   Placement date:   Placement time:   Site:  Days:   Peripheral IV 05/16/19 Right Antecubital   05/16/19    1628    Antecubital   less than 1          Intake/Output Last 24 hours  Intake/Output Summary (Last 24 hours) at 05/16/2019 2015 Last data filed at 05/16/2019 1858 Gross per 24 hour  Intake 1000 ml  Output -  Net 1000 ml    Labs/Imaging Results for orders placed or performed during the hospital encounter of 05/16/19 (from the past 48 hour(s))  Lipase, blood     Status: None   Collection Time: 05/16/19  4:32 PM  Result Value Ref Range   Lipase 29 11 - 51 U/L     Comment: Performed at [facility], [address] [place], [place] [address]  Comprehensive metabolic panel     Status: Abnormal   Collection Time: 05/16/19  4:32 PM  Result Value Ref Range   Sodium 137 135 - 145 mmol/L   Potassium 3.8 3.5 - 5.1 mmol/L   Chloride 104 98 - 111 mmol/L   CO2 25 22 - 32 mmol/L   Glucose, Bld 171 (H) 70 - 99 mg/dL   BUN 26 (H) 8 - 23 mg/dL   Creatinine, Ser 9.811.23 0.61 - 1.24 mg/dL   Calcium 9.3 8.9 - 19.110.3 mg/dL   Total Protein 7.9 6.5 - 8.1 g/dL   Albumin 3.7 3.5 - 5.0 g/dL   AST 39 15 - 41 U/L   ALT 31 0 - 44 U/L   Alkaline Phosphatase 65 38 - 126 U/L   Total Bilirubin 0.8 0.3 - 1.2 mg/dL   GFR calc non Af Amer 57 (L) >60 mL/min   GFR calc Af Amer >60 >60 mL/min   Anion gap 8 5 - 15    Comment: Performed at [facility], [address] [place], [place] [address]  CBC with Differential     Status: Abnormal   Collection Time: 05/16/19  4:32 PM  Result Value Ref Range   WBC 16.4 (H) 4.0 - 10.5 K/uL   RBC 4.36 4.22 - 5.81 MIL/uL   Hemoglobin 11.0 (L) 13.0 - 17.0 g/dL   HCT 56.235.9 (L) 13.039.0 - 86.552.0 %   MCV 82.3 80.0 - 100.0 fL   MCH 25.2 (L) 26.0 - 34.0 pg   MCHC 30.6 30.0 - 36.0 g/dL   RDW 78.422.1 (H) 69.611.5 - 29.515.5 %   Platelets 232 150 - 400 K/uL   nRBC 0.0 0.0 - 0.2 %   Neutrophils Relative % 85 %   Neutro Abs 14.0 (H) 1.7 - 7.7 K/uL   Lymphocytes Relative 6 %   Lymphs Abs 1.0 0.7 - 4.0 K/uL   Monocytes Relative 8 %   Monocytes Absolute 1.3 (H) 0.1 - 1.0 K/uL   Eosinophils Relative 0 %   Eosinophils Absolute 0.0 0.0 - 0.5 K/uL   Basophils Relative 0 %   Basophils Absolute 0.0 0.0 - 0.1 K/uL   Immature Granulocytes 1 %   Abs Immature Granulocytes 0.10 (H) 0.00 - 0.07 K/uL   Acanthocytes PRESENT    Burr Cells PRESENT    Dimorphism PRESENT    Target Cells PRESENT    Spherocytes PRESENT    Ovalocytes PRESENT     Comment: Performed at [facility], [address] [place], [place] [address]  Occult bld  gastric/duodenum (cup to lab)     Status: Abnormal   Collection Time: 05/16/19  4:59 PM  Result Value Ref Range  pH, Gastric 2    Occult Blood, Gastric POSITIVE (A) NEGATIVE    Comment: Performed at [facility], [address] [place], [place] [address]  SARS Coronavirus 2 (CEPHEID - Performed in [facility] hospital lab), Hosp Order     Status: None   Collection Time: 05/16/19  6:40 PM   Specimen: Nasopharyngeal Swab  Result Value Ref Range   SARS Coronavirus 2 NEGATIVE NEGATIVE    Comment: (NOTE) If result is NEGATIVE SARS-CoV-2 target nucleic acids are NOT DETECTED. The SARS-CoV-2 RNA is generally detectable in upper and lower  respiratory specimens during the acute phase of infection. The lowest  concentration of SARS-CoV-2 viral copies this assay can detect is 250  copies / mL. A negative result does not preclude SARS-CoV-2 infection  and should not be used as the sole basis for treatment or other  patient management decisions.  A negative result may occur with  improper specimen collection / handling, submission of specimen other  than nasopharyngeal swab, presence of viral mutation(s) within the  areas targeted by this assay, and inadequate number of viral copies  (<250 copies / mL). A negative result must be combined with clinical  observations, patient history, and epidemiological information. If result is POSITIVE SARS-CoV-2 target nucleic acids are DETECTED. The SARS-CoV-2 RNA is generally detectable in upper and lower  respiratory specimens dur ing the acute phase of infection.  Positive  results are indicative of active infection with SARS-CoV-2.  Clinical  correlation with patient history and other diagnostic information is  necessary to determine patient infection status.  Positive results do  not rule out bacterial infection or co-infection with other viruses. If result is PRESUMPTIVE POSTIVE SARS-CoV-2 nucleic acids MAY BE PRESENT.   A presumptive  positive result was obtained on the submitted specimen  and confirmed on repeat testing.  While 2019 novel coronavirus  (SARS-CoV-2) nucleic acids may be present in the submitted sample  additional confirmatory testing may be necessary for epidemiological  and / or clinical management purposes  to differentiate between  SARS-CoV-2 and other Sarbecovirus currently known to infect humans.  If clinically indicated additional testing with an alternate test  methodology [id number]) is advised. The SARS-CoV-2 RNA is generally  detectable in upper and lower respiratory sp ecimens during the acute  phase of infection. The expected result is Negative. Fact Sheet for Patients:  BoilerBrush.com.cy Fact Sheet for Healthcare Providers: https://pope.com/ This test is not yet approved or cleared by the [place] FDA and has been authorized for detection and/or diagnosis of SARS-CoV-2 by FDA under an Emergency Use Authorization (EUA).  This EUA will remain in effect (meaning this test can be used) for the duration of the COVID-19 declaration under Section 564(b)(1) of the Act, 21 U.S.C. section 360bbb-3(b)(1), unless the authorization is terminated or revoked sooner. Performed at [facility], [address] [place], [place] [address]   Type and screen [facility]     Status: None (Preliminary result)   Collection Time: 05/16/19  6:40 PM  Result Value Ref Range   ABO/RH(D) PENDING    Antibody Screen PENDING    Sample Expiration      05/19/2019,2359 Performed at [facility], [address] [place], [place] [address]   APTT     Status: None   Collection Time: 05/16/19  7:16 PM  Result Value Ref Range   aPTT 29 24 - 36 seconds    Comment: Performed at [facility], [address] [place], [place] [address]  Protime-INR     Status: None   Collection Time: 05/16/19  7:16 PM  Result Value Ref  Range   Prothrombin Time 15.0 11.4 - 15.2 seconds   INR 1.2 0.8 - 1.2    Comment: (NOTE) INR goal varies based on device and disease states. Performed at [facility], [address] [place], [place] [address]   Type and screen Ordered by PROVIDER DEFAULT     Status: None (Preliminary result)   Collection Time: 05/16/19  7:16 PM  Result Value Ref Range   ABO/RH(D) PENDING    Antibody Screen PENDING    Sample Expiration      05/19/2019,2359 Performed at [facility], [address] [place], [place] [address]    No results found.  Pending Labs [employer] (From admission, onward)    Start     Ordered   Signed and Armed forces training and education officerHeld  Basic metabolic panel  Tomorrow morning,   R     Signed and Held   Signed and Held  CBC  Tomorrow morning,   R     Signed and Held          Vitals/Pain Today's Vitals   05/16/19 1632 05/16/19 1830 05/16/19 1900 05/16/19 1930  BP: (!) 141/78 108/82 113/77 105/65  Pulse: 81 89 88 93  Resp: (!) 25 (!) 23 19 (!) 28  Temp: 98.5 F (36.9 C)     TempSrc: Oral     SpO2: 100% 98% 97% 98%  Weight:      Height:      PainSc:        Isolation Precautions No active isolations  Medications Medications  insulin aspart (novoLOG) injection 0-5 Units (has no administration in time range)  insulin aspart (novoLOG) injection 0-9 Units (has no administration in time range)  pantoprazole (PROTONIX) injection 40 mg (40 mg Intravenous Given 05/16/19 1718)  ondansetron (ZOFRAN) injection 4 mg (4 mg Intravenous Given 05/16/19 1718)  0.9 %  sodium chloride infusion (0 mLs Intravenous Stopped 05/16/19 1858)    Mobility non-ambulatory Low fall risk   Focused Assessments GI   R Recommendations: See Admitting Provider Note  Report given to:   Additional Notes:

## 2019-05-16 NOTE — ED Notes (Signed)
Pt pauses to answer all questions and appears to be drowsy He is oriented person and place but does not understand severity of illness and is requesting to go home stating that he only came because his wife made him

## 2019-05-17 ENCOUNTER — Inpatient Hospital Stay: Payer: Medicare Other

## 2019-05-17 DIAGNOSIS — K922 Gastrointestinal hemorrhage, unspecified: Secondary | ICD-10-CM

## 2019-05-17 DIAGNOSIS — K920 Hematemesis: Principal | ICD-10-CM

## 2019-05-17 LAB — BASIC METABOLIC PANEL
Anion gap: 8 (ref 5–15)
BUN: 28 mg/dL — ABNORMAL HIGH (ref 8–23)
CO2: 23 mmol/L (ref 22–32)
Calcium: 9 mg/dL (ref 8.9–10.3)
Chloride: 111 mmol/L (ref 98–111)
Creatinine, Ser: 1.15 mg/dL (ref 0.61–1.24)
GFR calc Af Amer: >60 mL/min (ref >60)
GFR calc non Af Amer: >60 mL/min (ref >60)
Glucose, Bld: 176 mg/dL — ABNORMAL HIGH (ref 70–99)
Potassium: 4 mmol/L (ref 3.5–5.1)
Sodium: 142 mmol/L (ref 135–145)

## 2019-05-17 LAB — CBC
HCT: 34.1 % — ABNORMAL LOW (ref 39.0–52.0)
Hemoglobin: 10.3 g/dL — ABNORMAL LOW (ref 13.0–17.0)
MCH: 25.2 pg — ABNORMAL LOW (ref 26.0–34.0)
MCHC: 30.2 g/dL (ref 30.0–36.0)
MCV: 83.4 fL (ref 80.0–100.0)
Platelets: 195 10*3/uL (ref 150–400)
RBC: 4.09 MIL/uL — ABNORMAL LOW (ref 4.22–5.81)
RDW: 22.4 % — ABNORMAL HIGH (ref 11.5–15.5)
WBC: 19 10*3/uL — ABNORMAL HIGH (ref 4.0–10.5)
nRBC: 0 % (ref 0.0–0.2)

## 2019-05-17 LAB — GLUCOSE, CAPILLARY
Glucose-Capillary: 156 mg/dL — ABNORMAL HIGH (ref 70–99)
Glucose-Capillary: 159 mg/dL — ABNORMAL HIGH (ref 70–99)
Glucose-Capillary: 116 mg/dL — ABNORMAL HIGH (ref 70–99)
Glucose-Capillary: 164 mg/dL — ABNORMAL HIGH (ref 70–99)

## 2019-05-17 LAB — VITAMIN B12: Vitamin B-12: 1044 pg/mL — ABNORMAL HIGH (ref 180–914)

## 2019-05-17 LAB — FOLATE: Folate: 15.3 ng/mL (ref >5.9)

## 2019-05-17 LAB — FERRITIN: Ferritin: 23 ng/mL — ABNORMAL LOW (ref 24–336)

## 2019-05-17 MED ORDER — SODIUM CHLORIDE 0.9 % IV SOLN
510.0000 mg | Freq: Once | INTRAVENOUS | Status: AC
  Administered 2019-05-17: 16:00:00 510 mg via INTRAVENOUS
  Filled 2019-05-17: qty 17

## 2019-05-17 NOTE — Progress Notes (Signed)
[employer] - [place] [facility]   PATIENT NAME: Kevin Case    MR#:  [id number]  DATE OF BIRTH:  1941/05/03  SUBJECTIVE:  CHIEF COMPLAINT:   Chief Complaint  Patient presents with  . Emesis   Vomiting with dark blood early a.m. No abdominal pain.  REVIEW OF SYSTEMS:    Review of Systems  Constitutional: Positive for malaise/fatigue. Negative for chills, fever and weight loss.  HENT: Negative for hearing loss and nosebleeds.   Eyes: Negative for blurred vision, double vision and pain.  Respiratory: Negative for cough, hemoptysis, sputum production, shortness of breath and wheezing.   Cardiovascular: Negative for chest pain, palpitations, orthopnea and leg swelling.  Gastrointestinal: Negative for abdominal pain, constipation, diarrhea, nausea and vomiting.  Genitourinary: Negative for dysuria and hematuria.  Musculoskeletal: Negative for back pain, falls and myalgias.  Skin: Negative for rash.  Neurological: Negative for dizziness, tremors, sensory change, speech change, focal weakness, seizures and headaches.  Endo/Heme/Allergies: Does not bruise/bleed easily.  Psychiatric/Behavioral: Negative for depression and memory loss. The patient is not nervous/anxious.     DRUG ALLERGIES:  No Known Allergies  VITALS:  Blood pressure (!) 141/51, pulse 68, temperature 98.2 F (36.8 C), temperature source Oral, resp. rate 18, height 5\' 9"  (1.753 m), weight 63.4 kg, SpO2 100 %.  PHYSICAL EXAMINATION:   Physical Exam  GENERAL:  78 y.o.-year-old patient lying in the bed with no acute distress.  EYES: Pupils equal, round, reactive to light and accommodation. No scleral icterus. Extraocular muscles intact.  HEENT: Head atraumatic, normocephalic. Oropharynx and nasopharynx clear.  NECK:  Supple, no jugular venous distention. No thyroid enlargement, no tenderness.  LUNGS: Normal breath sounds bilaterally, no wheezing, rales, rhonchi. No use of accessory muscles of  respiration.  CARDIOVASCULAR: S1, S2 normal. No murmurs, rubs, or gallops.  ABDOMEN: Soft, nontender, nondistended. Bowel sounds present. No organomegaly or mass.  EXTREMITIES: No cyanosis, clubbing or edema b/l.    NEUROLOGIC: Cranial nerves II through XII are intact. No focal Motor or sensory deficits b/l.   PSYCHIATRIC: The patient is alert and oriented x 3.  SKIN: No obvious rash, lesion, or ulcer.   LABORATORY PANEL:   CBC Recent Labs  Lab 05/17/19 0410  WBC 19.0*  HGB 10.3*  HCT 34.1*  PLT 195   ------------------------------------------------------------------------------------------------------------------ Chemistries  Recent Labs  Lab 05/16/19 1632 05/17/19 0410  NA 137 142  K 3.8 4.0  CL 104 111  CO2 25 23  GLUCOSE 171* 176*  BUN 26* 28*  CREATININE 1.23 1.15  CALCIUM 9.3 9.0  AST 39  --   ALT 31  --   ALKPHOS 65  --   BILITOT 0.8  --    ------------------------------------------------------------------------------------------------------------------  Cardiac Enzymes No results for input(s): TROPONINI in the last 168 hours. ------------------------------------------------------------------------------------------------------------------  RADIOLOGY:  No results found.   ASSESSMENT AND PLAN:   78 y.o. male with a known history of coronary artery disease, carotid artery disease, diabetes, previous history of GI bleeding ischemic cardiomyopathy ejection fraction of 30 to 35%, aortic stenosis, peripheral artery disease, previous history of subclavian stenosis who presents to the hospital due to multiple episodes of nausea and vomiting and the vomiting turning more bloody/bright red in color over the past 24 hours  * GI bleed-suspected to be upper GI bleed given patient's bright red blood/mild hematemesis. - Placed on Protonix twice daily - Discussed with Dr. Allegra Lai of GI.  EGD in the morning.  *  Essential hypertension-patient's blood pressure is  a bit on  the low side.  Hold his antihypertensives for now.  *  Diabetes-hold patient's Jardiance. -Will place on sliding scale insulin for now given the patient's GI bleed.  *  History of peripheral vascular disease/carotid artery disease- continue atorvastatin.  Hold aspirin and Plavix given the GI bleed.  *  History of chronic systolic CHF-clinically patient is not in congestive heart failure. -Continue Lasix.  *  Leukocytosis.  No fever.  No signs of infection.  Will check urinalysis and chest x-ray.  Aspiration?  No antibiotics.  Repeat labs in the morning.  All the records are reviewed and case discussed with Care Management/Social Worker Management plans discussed with the patient, family and they are in agreement.  CODE STATUS: FULL CODE  DVT Prophylaxis: SCDs  TOTAL TIME TAKING CARE OF THIS PATIENT: 30 minutes.   POSSIBLE D/C IN 1-2 DAYS, DEPENDING ON CLINICAL CONDITION.  Neita Carp M.D on 05/17/2019 at 4:26 PM  Between 7am to 6pm - Pager - [phone]  After 6pm go to [email or web address] - password [email or web address] [facility]  Office  [phone]  CC: Primary care physician; Trinna Post, PA-C  Note: This dictation was prepared with Dragon dictation along with smaller phrase technology. Any transcriptional errors that result from this process are unintentional.

## 2019-05-17 NOTE — Consult Note (Signed)
Kevin Darby, MD [address]  [place]  [place], [place] [address]  Main: [phone]  Fax: [phone] Pager: [phone]   Consultation  Referring Provider:     No ref. provider found Primary Care Physician:  Trinna Post, PA-C Primary Gastroenterologist: None         Reason for Consultation:     Hematemesis  Date of Admission:  05/16/2019 Date of Consultation:  05/17/2019         HPI:   Kevin Case is a 78 y.o. male history of coronary disease, carotid artery disease, diabetes, known peptic ulcer disease, ischemic cardiomyopathy, EF of 30 to 35%, aortic stenosis, peripheral artery disease, history of subclavian stenosis, on aspirin and Plavix presented to ER yesterday with episodes of nausea and hematemesis.  He reports having bloody emesis that started yesterday, last episode was 6 AM today.  Overall, he was having symptoms for the last 2 to 3 days.  His hemoglobin on admission was 11, 10.3 today.  No evidence of liver disease.  His BUN/creatinine are at baseline, normal LFTs, he denies abdominal pain, nausea, vomiting, fever, chills.  He was admitted to [facility] in 2018 due to dyspepsia, was found to have gastric ulcers clean based.  Biopsies were negative for H. pylori.  He acknowledges taking Protonix 40 mg twice daily at home as well as oral iron.  He is on aspirin and Plavix, last dose was yesterday.  He denies smoking or drinking alcohol He does have severe iron deficiency   NSAIDs: None  Antiplts/Anticoagulants/Anti thrombotics: Aspirin and Plavix  GI Procedures: EGD in 2018 at [facility] - Normal esophagus.  - Non-bleeding gastric ulcer with pigmented material.   Biopsied.  - Non-bleeding gastric ulcer with no stigmata of bleeding.   Biopsied.  - Mucosal changes in the gastric body and antrum. Biopsied.  - Duodenitis. A: Stomach, antrum, biopsy -  Reactive foveolar hyperplasia, consistent with reactive gastropathy - Negative for dysplasia and malignancy - No Helicobacter identified on H&E stain  B: Stomach, greater curvature, biopsy - Acute and chronic inflammation and reactive foveolar hyperplasia - Negative for dysplasia and malignancy - No Helicobacter identified by immunostain  C: Stomach, incisura, biopsy - Mild chronic inflammation and marked reactive foveolar hyperplasia - Negative for dysplasia and malignancy - No Helicobacter identified on H&E stain  Colonoscopy in 2008 and 2013 at [place] not available  Pathology from 2008 Diagnosis: Colon, sigmoid, biopsy -Adenomatous polyp (1 fragment)  Past Medical History:  Diagnosis Date  . Anemia   . Blood transfusion without reported diagnosis   . CAD (coronary artery disease)    a. 03/2019 Cath: LM nl, LAD 50p CA2+, 32m D1 50ost, LCX 20p, 857mRCA small, mild diff dzs-->Med rx. Consider PCI LCX for refractory angina.  . Carotid arterial disease ([facility]   a. 03/2019 Carotid U/S: RICA 11-07-59%RECA <5<44%LICA 11-06-13%LECA <5<40% . CHF (congestive heart failure) ([facility]  . Clotting disorder ([facility]  . Diabetes mellitus without complication ([facility]  . GIB (gastrointestinal bleeding)    a. 03/2019 BRBPR following PV procedure req anticoagulation-->2u PRBCs.  . Heart murmur   . HFrEF (heart failure with reduced ejection fraction) ([facility]   a. 03/2019 Echo: EF 30-35%, mild conc LVH. Mildly dil LA. Mod MV annular dil. Mod AS (mean grad 1839m, Valve area 0.76).  . HMarland Kitchenstory of tobacco abuse   . Hypertension   . Ischemic cardiomyopathy    a. 03/2019  Echo: EF 30-35%.  . Left bundle branch block   . Moderate aortic stenosis    a. 03/2019 Echo:  Mod AS (mean grad 47mHg, Valve area 0.76).  .Marland KitchenPAD (peripheral artery disease) ([facility]    a. 03/2019 PV Angio: RCFA 80, RSFA 100.  . Rib fracture   . Sinus bradycardia   . Subclavian arterial stenosis ([facility]    a. 03/2019 PV Angio: RSCA 90  after origin of CCA. RCCA 30ost, LSCA 80 (PTA and covered stenting).    Past Surgical History:  Procedure Laterality Date  . AORTIC ARCH ANGIOGRAPHY N/A 03/31/2019   Procedure: AORTIC ARCH ANGIOGRAPHY;  Surgeon: AWellington Hampshire MD;  [facility];  Service: Cardiovascular;  Laterality: N/A;  . CARDIAC CATHETERIZATION    . PERIPHERAL VASCULAR INTERVENTION Left 03/31/2019   Procedure: PERIPHERAL VASCULAR INTERVENTION;  Surgeon: AWellington Hampshire MD;  [facility];  Service: Cardiovascular;  Laterality: Left;  . RIGHT/LEFT HEART CATH AND CORONARY ANGIOGRAPHY N/A 03/15/2019   Procedure: RIGHT/LEFT HEART CATH AND CORONARY ANGIOGRAPHY;  Surgeon: AWellington Hampshire MD;  [facility];  Service: Cardiovascular;  Laterality: N/A;    Current Facility-Administered Medications:  .  0.9 %  sodium chloride infusion, , Intravenous, Continuous, Sainani, VBelia Heman MD, Last Rate: 75 mL/hr at 05/17/19 0347 .  acetaminophen (TYLENOL) tablet 650 mg, 650 mg, Oral, Q6H PRN **OR** acetaminophen (TYLENOL) suppository 650 mg, 650 mg, Rectal, Q6H PRN, Sainani, Vivek J, MD .  atorvastatin (LIPITOR) tablet 80 mg, 80 mg, Oral, QHS, Sainani, VBelia Heman MD, 80 mg at 05/16/19 2151 .  ferrous sulfate tablet 325 mg, 325 mg, Oral, Q breakfast, SHenreitta Leber MD, 325 mg at 05/17/19 02876.  ferumoxytol (FERAHEME) 510 mg in sodium chloride 0.9 % 100 mL IVPB, 510 mg, Intravenous, Once, Vanga, RTally Due MD .  furosemide (LASIX) tablet 10 mg, 10 mg, Oral, Daily, Sainani, VBelia Heman MD, 10 mg at 05/17/19 1027 .  insulin aspart (novoLOG) injection 0-5 Units, 0-5 Units, Subcutaneous, QHS, Sainani, Vivek J, MD .  insulin aspart (novoLOG) injection 0-9 Units, 0-9 Units, Subcutaneous, TID WC, SHenreitta Leber MD, 2 Units at 05/17/19 1247 .  Melatonin TABS 10 mg, 10 mg, Oral, QHS, Sainani, VBelia Heman MD, 10 mg at 05/16/19 2151 .  multivitamin with minerals tablet 1 tablet, 1 tablet, Oral,  Daily, SHenreitta Leber MD, 1 tablet at 05/17/19 1027 .  omega-3 acid ethyl esters (LOVAZA) capsule 1 g, 1 g, Oral, Daily, Sainani, VBelia Heman MD, 1 g at 05/17/19 1031 .  ondansetron (ZOFRAN) tablet 4 mg, 4 mg, Oral, Q6H PRN **OR** ondansetron (ZOFRAN) injection 4 mg, 4 mg, Intravenous, Q6H PRN, SHenreitta Leber MD, 4 mg at 05/17/19 0025 .  pantoprazole (PROTONIX) injection 40 mg, 40 mg, Intravenous, Q12H, Sainani, VBelia Heman MD, 40 mg at 05/17/19 1027 .  traMADol (ULTRAM) tablet 50 mg, 50 mg, Oral, Q6H PRN, Sainani, VBelia Heman MD .  vitamin B-12 (CYANOCOBALAMIN) tablet 100 mcg, 100 mcg, Oral, Daily, SHenreitta Leber MD, 100 mcg at 05/17/19 1028    Family History  Problem Relation Age of Onset  . Congestive Heart Failure Father        had AICD placed.   . Dementia Father   . Dementia Mother      Social History   Tobacco Use  . Smoking status: Former Smoker    Packs/day: 1.50    Years: 57.00    Pack years:  85.50    Types: Cigarettes  . Smokeless tobacco: Never Used  Substance Use Topics  . Alcohol use: No    Frequency: Never  . Drug use: Never    Allergies as of 05/16/2019  . (No Known Allergies)    Review of Systems:    All systems reviewed and negative except where noted in HPI.   Physical Exam:  Vital signs in last 24 hours: Temp:  [98.2 F (36.8 C)-98.7 F (37.1 C)] 98.2 F (36.8 C) (07/13 1140) Pulse Rate:  [68-93] 68 (07/13 1140) Resp:  [16-28] 18 (07/13 1140) BP: (101-159)/(51-82) 141/51 (07/13 1140) SpO2:  [92 %-100 %] 100 % (07/13 1140) Weight:  [62.1 kg-63.4 kg] 63.4 kg (07/12 2054) Last BM Date: 05/15/19   General:   Pleasant, cooperative in NAD Head:  Normocephalic and atraumatic. Eyes:   No icterus.   Conjunctiva pink. PERRLA. Ears:  Normal auditory acuity. Neck:  Supple; no masses or thyroidomegaly Lungs: Respirations even and unlabored. Lungs clear to auscultation bilaterally.   No wheezes, crackles, or rhonchi.  Heart:  Regular rate and rhythm;   Without murmur, clicks, rubs or gallops Abdomen:  Soft, nondistended, nontender. Normal bowel sounds. No appreciable masses or hepatomegaly.  No rebound or guarding.  Rectal:  Not performed. Msk:  Symmetrical without gross deformities.  Strength generalized weakness Extremities:  Without edema, cyanosis or clubbing. Neurologic:  Alert and oriented x3;  grossly normal neurologically. Skin:  Intact without significant lesions or rashes. Psych:  Alert and cooperative. Normal affect.  LAB RESULTS: CBC Latest Ref Rng & Units 05/17/2019 05/16/2019 05/04/2019  WBC 4.0 - 10.5 K/uL 19.0(H) 16.4(H) 8.0  Hemoglobin 13.0 - 17.0 g/dL 10.3(L) 11.0(L) 10.0(L)  Hematocrit 39.0 - 52.0 % 34.1(L) 35.9(L) 32.2(L)  Platelets 150 - 400 K/uL 195 232 220    BMET BMP Latest Ref Rng & Units 05/17/2019 05/16/2019 05/04/2019  Glucose 70 - 99 mg/dL 176(H) 171(H) 110(H)  BUN 8 - 23 mg/dL 28(H) 26(H) 26  Creatinine 0.61 - 1.24 mg/dL 1.15 1.23 1.36(H)  BUN/Creat Ratio 10 - 24 - - 19  Sodium 135 - 145 mmol/L 142 137 138  Potassium 3.5 - 5.1 mmol/L 4.0 3.8 4.3  Chloride 98 - 111 mmol/L 111 104 103  CO2 22 - 32 mmol/L '23 25 21  '$ Calcium 8.9 - 10.3 mg/dL 9.0 9.3 9.3    LFT Hepatic Function Latest Ref Rng & Units 05/16/2019 05/04/2019 03/13/2019  Total Protein 6.5 - 8.1 g/dL 7.9 7.2 6.2(L)  Albumin 3.5 - 5.0 g/dL 3.7 4.0 2.8(L)  AST 15 - 41 U/L 39 41(H) 50(H)  ALT 0 - 44 U/L 31 32 30  Alk Phosphatase 38 - 126 U/L 65 72 54  Total Bilirubin 0.3 - 1.2 mg/dL 0.8 0.3 0.7     STUDIES: No results found.    Impression / Plan:   Kevin Case is a 78 y.o. male with coronary disease, extensive vasculopathy on DAPT, known history of peptic ulcer disease diabetes admitted with hematemesis, neutrophilic leukocytosis  Hematemesis Continue IV Protonix No evidence of chronic liver disease Okay with clear liquid diet With known history of peptic ulcer disease, recommend EGD for further evaluation N.p.o. past midnight  Monitor CBC daily Okay with baby aspirin, continue to hold Plavix  Neutrophilic leukocytosis Work-up per primary   Thank you for involving me in the care of this patient.  Will follow along with you    LOS: 1 day   Sherri Sear, MD  05/17/2019, 3:51 PM  Note: This dictation was prepared with Dragon dictation along with smaller phrase technology. Any transcriptional errors that result from this process are unintentional.

## 2019-05-18 ENCOUNTER — Telehealth: Payer: Self-pay | Admitting: Gastroenterology

## 2019-05-18 ENCOUNTER — Encounter: Payer: Self-pay | Admitting: Anesthesiology

## 2019-05-18 LAB — CBC WITH DIFFERENTIAL/PLATELET
Abs Immature Granulocytes: 0.04 10*3/uL (ref 0.00–0.07)
Basophils Absolute: 0 10*3/uL (ref 0.0–0.1)
Basophils Relative: 0 %
Eosinophils Absolute: 0 10*3/uL (ref 0.0–0.5)
Eosinophils Relative: 0 %
HCT: 33 % — ABNORMAL LOW (ref 39.0–52.0)
Hemoglobin: 9.9 g/dL — ABNORMAL LOW (ref 13.0–17.0)
Immature Granulocytes: 0 %
Lymphocytes Relative: 8 %
Lymphs Abs: 1.1 10*3/uL (ref 0.7–4.0)
MCH: 25.4 pg — ABNORMAL LOW (ref 26.0–34.0)
MCHC: 30 g/dL (ref 30.0–36.0)
MCV: 84.8 fL (ref 80.0–100.0)
Monocytes Absolute: 1.1 10*3/uL — ABNORMAL HIGH (ref 0.1–1.0)
Monocytes Relative: 8 %
Neutro Abs: 11.3 10*3/uL — ABNORMAL HIGH (ref 1.7–7.7)
Neutrophils Relative %: 84 %
Platelets: 172 10*3/uL (ref 150–400)
RBC: 3.89 MIL/uL — ABNORMAL LOW (ref 4.22–5.81)
RDW: 22.9 % — ABNORMAL HIGH (ref 11.5–15.5)
WBC: 13.6 10*3/uL — ABNORMAL HIGH (ref 4.0–10.5)
nRBC: 0 % (ref 0.0–0.2)

## 2019-05-18 LAB — URINALYSIS, ROUTINE W REFLEX MICROSCOPIC
Bacteria, UA: NONE SEEN
Bilirubin Urine: NEGATIVE
Glucose, UA: >=500 mg/dL — AB
Ketones, ur: NEGATIVE mg/dL
Leukocytes,Ua: NEGATIVE
Nitrite: NEGATIVE
Protein, ur: 100 mg/dL — AB
Specific Gravity, Urine: 1.024 (ref 1.005–1.030)
pH: 5 (ref 5.0–8.0)

## 2019-05-18 LAB — GLUCOSE, CAPILLARY
Glucose-Capillary: 120 mg/dL — ABNORMAL HIGH (ref 70–99)
Glucose-Capillary: 113 mg/dL — ABNORMAL HIGH (ref 70–99)
Glucose-Capillary: 156 mg/dL — ABNORMAL HIGH (ref 70–99)
Glucose-Capillary: 149 mg/dL — ABNORMAL HIGH (ref 70–99)

## 2019-05-18 MED ORDER — SODIUM CHLORIDE 0.9 % IV SOLN
3.0000 g | Freq: Four times a day (QID) | INTRAVENOUS | Status: DC
  Administered 2019-05-18 – 2019-05-19 (×4): 3 g via INTRAVENOUS
  Filled 2019-05-18 (×7): qty 8
  Filled 2019-05-18 (×2): qty 3

## 2019-05-18 MED ORDER — SODIUM CHLORIDE 0.9 % IV SOLN
300.0000 mg | Freq: Once | INTRAVENOUS | Status: AC
  Administered 2019-05-18: 16:00:00 300 mg via INTRAVENOUS
  Filled 2019-05-18: qty 15

## 2019-05-18 NOTE — Consult Note (Signed)
Pharmacy Antibiotic Note  Kevin Case is a 78 y.o. male admitted on 05/16/2019 with Aspiration Pneumonia.  Pharmacy has been consulted for Unasyn dosing.  Plan: Will order Unasyn 3 g Q6H  Height: 5\' 9"  (175.3 cm) Weight: 139 lb 11.2 oz (63.4 kg) IBW/kg (Calculated) : 70.7  Temp (24hrs), Avg:98.4 F (36.9 C), Min:98.2 F (36.8 C), Max:98.5 F (36.9 C)  Recent Labs  Lab 05/16/19 1632 05/17/19 0410 05/18/19 0320  WBC 16.4* 19.0* 13.6*  CREATININE 1.23 1.15  --     Estimated Creatinine Clearance: 49.8 mL/min (by C-G formula based on SCr of 1.15 mg/dL).    No Known Allergies  Antimicrobials this admission: 7/14 Unasyn >>    Microbiology results: 7/12 SARS Coronavirus 2: Negative   Thank you for allowing pharmacy to be a part of this patient's care.  Rowland Lathe 05/18/2019 8:29 AM

## 2019-05-18 NOTE — Telephone Encounter (Signed)
Pt wife is calling for Dr. Marius Ditch pt is at [facility] and she would like to know when pt is having a procedure done she is very worried about her husband please call her   cb [phone]

## 2019-05-18 NOTE — Progress Notes (Signed)
Wife called to ask if patient was in room because he was not answering his phone. Checked patient per wife's request. Patient was asleep and phone by bedside. Told wife patient was asleep. Told wife I would let him know when I wake him to call her.   Fuller Mandril, RN

## 2019-05-18 NOTE — Progress Notes (Signed)
[place] at [place] NAME: Kevin Case    MR#:  [id number]  DATE OF BIRTH:  11/02/1941  SUBJECTIVE:  CHIEF COMPLAINT:   Chief Complaint  Patient presents with  . Emesis   Feels weak and has nausea  REVIEW OF SYSTEMS:    Review of Systems  Constitutional: Positive for malaise/fatigue. Negative for chills, fever and weight loss.  HENT: Negative for hearing loss and nosebleeds.   Eyes: Negative for blurred vision, double vision and pain.  Respiratory: Negative for cough, hemoptysis, sputum production, shortness of breath and wheezing.   Cardiovascular: Negative for chest pain, palpitations, orthopnea and leg swelling.  Gastrointestinal: Negative for abdominal pain, constipation, diarrhea, nausea and vomiting.  Genitourinary: Negative for dysuria and hematuria.  Musculoskeletal: Negative for back pain, falls and myalgias.  Skin: Negative for rash.  Neurological: Negative for dizziness, tremors, sensory change, speech change, focal weakness, seizures and headaches.  Endo/Heme/Allergies: Does not bruise/bleed easily.  Psychiatric/Behavioral: Negative for depression and memory loss. The patient is not nervous/anxious.    DRUG ALLERGIES:  No Known Allergies  VITALS:  Blood pressure (!) 158/66, pulse 67, temperature 99 F (37.2 C), temperature source Oral, resp. rate 20, height 5\' 9"  (1.753 m), weight 63.4 kg, SpO2 95 %.  PHYSICAL EXAMINATION:   Physical Exam  GENERAL:  78 y.o.-year-old patient lying in the bed with no acute distress.  EYES: Pupils equal, round, reactive to light and accommodation. No scleral icterus. Extraocular muscles intact.  HEENT: Head atraumatic, normocephalic. Oropharynx and nasopharynx clear.  NECK:  Supple, no jugular venous distention. No thyroid enlargement, no tenderness.  LUNGS: Normal breath sounds bilaterally, no wheezing, rales, rhonchi. No use of accessory muscles of respiration.  CARDIOVASCULAR: S1,  S2 normal. No murmurs, rubs, or gallops.  ABDOMEN: Soft, nontender, nondistended. Bowel sounds present. No organomegaly or mass.  EXTREMITIES: No cyanosis, clubbing or edema b/l.    NEUROLOGIC: Cranial nerves II through XII are intact. No focal Motor or sensory deficits b/l.   PSYCHIATRIC: The patient is alert and oriented x 3.  SKIN: No obvious rash, lesion, or ulcer.   LABORATORY PANEL:   CBC Recent Labs  Lab 05/18/19 0320  WBC 13.6*  HGB 9.9*  HCT 33.0*  PLT 172   ------------------------------------------------------------------------------------------------------------------ Chemistries  Recent Labs  Lab 05/16/19 1632 05/17/19 0410  NA 137 142  K 3.8 4.0  CL 104 111  CO2 25 23  GLUCOSE 171* 176*  BUN 26* 28*  CREATININE 1.23 1.15  CALCIUM 9.3 9.0  AST 39  --   ALT 31  --   ALKPHOS 65  --   BILITOT 0.8  --    ------------------------------------------------------------------------------------------------------------------  Cardiac Enzymes No results for input(s): TROPONINI in the last 168 hours. ------------------------------------------------------------------------------------------------------------------  RADIOLOGY:  Dg Chest 2 View  Result Date: 05/17/2019 CLINICAL DATA:  Vomiting blood.  Weakness, leukocytosis. EXAM: CHEST - 2 VIEW COMPARISON:  03/31/2019 FINDINGS: Heart is normal size. Patchy airspace opacities in the lower lobes bilaterally. No effusions. No acute bony abnormality. Old left rib fractures noted. IMPRESSION: Patchy bilateral lower lobe airspace opacities which could reflect multifocal pneumonia. Electronically Signed   By: Rolm Baptise M.D.   On: 05/17/2019 19:08     ASSESSMENT AND PLAN:   78 y.o. male with a known history of coronary artery disease, carotid artery disease, diabetes, previous history of GI bleeding ischemic cardiomyopathy ejection fraction of 30 to 35%, aortic stenosis, peripheral artery disease, previous history of  subclavian  stenosis who presents to the hospital due to multiple episodes of nausea and vomiting and the vomiting turning more bloody/bright red in color over the past 24 hours  * GI bleed-suspected to be upper GI bleed given patient's bright red blood/mild hematemesis. - Placed on Protonix twice daily - Discussed with Dr. Allegra Lai of GI.  EGD in the morning.  Discussed with patient's cardiologist Dr. Kirke Corin.  Advise discontinuing Plavix and restarting aspirin once bleeding resolves.   *Aspiration pneumonia Bibasilar infiltrates.  No fever or shortness of breath.  Has left basal rhonchi. Due to leukocytosis will start IV antibiotics.  *  Essential hypertension-patient's blood pressure is a bit on the low side.  Hold his antihypertensives for now.  *  Diabetes-hold patient's Jardiance. SSI  *  History of peripheral vascular disease/carotid artery disease- continue atorvastatin.  Hold aspirin and Plavix given the GI bleed.  *  History of chronic systolic CHF-clinically patient is not in congestive heart failure. -Continue Lasix.  All the records are reviewed and case discussed with Care Management/Social Worker Management plans discussed with the patient, family and they are in agreement.  CODE STATUS: FULL CODE  DVT Prophylaxis: SCDs  TOTAL TIME TAKING CARE OF THIS PATIENT: 30 minutes.   POSSIBLE D/C IN 1-2 DAYS, DEPENDING ON CLINICAL CONDITION.  Molinda Bailiff Lilli Dewald M.D on 05/18/2019 at 2:15 PM  Between 7am to 6pm - Pager - [phone]  After 6pm go to [email or web address] - password [email or web address] [facility]  [facility]  Office  [phone]  CC: Primary care physician; Trey Sailors, PA-C  Note: This dictation was prepared with Dragon dictation along with smaller phrase technology. Any transcriptional errors that result from this process are unintentional.

## 2019-05-18 NOTE — Telephone Encounter (Signed)
Patient's wife called very upset stating she had not heard from Dr Marius Ditch. She is very anxious about her husband's care & why the procedure was cancelled today. She states he doesn't know & if it will be done tomorrow. She would like to speak with Dr Marius Ditch to determine what's going on. Please call her@ [phone]

## 2019-05-18 NOTE — Progress Notes (Signed)
Advance care planning  Purpose of Encounter GI bleed  Parties in Attendance Patient  Patients Decisional capacity Alert and oriented.  Able to make medical decisions.  Wife is his healthcare power of attorney.  Discussed in detail regarding upper GI bleed.  Treatment plan , prognosis discussed.  All questions answered  CODE STATUS discussed.  Patient would like aggressive medical care with CPR/intubation/defibrillation if needed.  Does not want prolonged ventilatory support.  FULL CODE  Time spent - 17 minutes

## 2019-05-18 NOTE — Progress Notes (Signed)
Arlyss Repress, MD [address]  [address]  [place], [place] [address]  Main: [phone]  Fax: [phone] Pager: [phone]   Subjective: EGD was canceled today by anesthesia due to bilateral pulmonary infiltrates on chest x-ray and leukocytosis.  Patient is sitting comfortably in bed, eating.  He denied chest pain, shortness of breath.  He speaking in full sentences.  He is started on Unasyn for pneumonia, he denies abdominal pain, nausea, vomiting, rectal bleeding  Objective: Vital signs in last 24 hours: Vitals:   05/17/19 1140 05/17/19 2123 05/18/19 0415 05/18/19 1217  BP: (!) 141/51 123/65 (!) 159/57 (!) 158/66  Pulse: 68 69 68 67  Resp: 18 20 20 20   Temp: 98.2 F (36.8 C) 98.4 F (36.9 C) 98.5 F (36.9 C) 99 F (37.2 C)  TempSrc: Oral Oral Oral Oral  SpO2: 100% 96% 93% 95%  Weight:      Height:       Weight change:   Intake/Output Summary (Last 24 hours) at 05/18/2019 1840 Last data filed at 05/17/2019 2344 Gross per 24 hour  Intake 780 ml  Output 650 ml  Net 130 ml     Exam: Heart:: Regular rate and rhythm or S1S2 present Lungs: normal and clear to auscultation Abdomen: soft, nontender, normal bowel sounds   Lab Results: CBC Latest Ref Rng & Units 05/18/2019 05/17/2019 05/16/2019  WBC 4.0 - 10.5 K/uL 13.6(H) 19.0(H) 16.4(H)  Hemoglobin 13.0 - 17.0 g/dL 6.2(G) 10.3(L) 11.0(L)  Hematocrit 39.0 - 52.0 % 33.0(L) 34.1(L) 35.9(L)  Platelets 150 - 400 K/uL 172 195 232   CMP Latest Ref Rng & Units 05/17/2019 05/16/2019 05/04/2019  Glucose 70 - 99 mg/dL 315(V) 761(Y) 073(X)  BUN 8 - 23 mg/dL 10(G) 26(R) 26  Creatinine 0.61 - 1.24 mg/dL 4.85 4.62 7.03(J)  Sodium 135 - 145 mmol/L 142 137 138  Potassium 3.5 - 5.1 mmol/L 4.0 3.8 4.3  Chloride 98 - 111 mmol/L 111 104 103  CO2 22 - 32 mmol/L 23 25 21   Calcium 8.9 - 10.3 mg/dL 9.0 9.3 9.3  Total Protein 6.5 - 8.1 g/dL - 7.9 7.2  Total Bilirubin 0.3 - 1.2 mg/dL - 0.8 0.3  Alkaline Phos 38 - 126 U/L - 65  72  AST 15 - 41 U/L - 39 41(H)  ALT 0 - 44 U/L - 31 32    Micro Results: Recent Results (from the past 240 hour(s))  SARS Coronavirus 2 (CEPHEID - Performed in [facility] hospital lab), Hosp Order     Status: None   Collection Time: 05/16/19  6:40 PM   Specimen: Nasopharyngeal Swab  Result Value Ref Range Status   SARS Coronavirus 2 NEGATIVE NEGATIVE Final    Comment: (NOTE) If result is NEGATIVE SARS-CoV-2 target nucleic acids are NOT DETECTED. The SARS-CoV-2 RNA is generally detectable in upper and lower  respiratory specimens during the acute phase of infection. The lowest  concentration of SARS-CoV-2 viral copies this assay can detect is 250  copies / mL. A negative result does not preclude SARS-CoV-2 infection  and should not be used as the sole basis for treatment or other  patient management decisions.  A negative result may occur with  improper specimen collection / handling, submission of specimen other  than nasopharyngeal swab, presence of viral mutation(s) within the  areas targeted by this assay, and inadequate number of viral copies  (<250 copies / mL). A negative result must be combined with clinical  observations, patient history,  and epidemiological information. If result is POSITIVE SARS-CoV-2 target nucleic acids are DETECTED. The SARS-CoV-2 RNA is generally detectable in upper and lower  respiratory specimens dur ing the acute phase of infection.  Positive  results are indicative of active infection with SARS-CoV-2.  Clinical  correlation with patient history and other diagnostic information is  necessary to determine patient infection status.  Positive results do  not rule out bacterial infection or co-infection with other viruses. If result is PRESUMPTIVE POSTIVE SARS-CoV-2 nucleic acids MAY BE PRESENT.   A presumptive positive result was obtained on the submitted specimen  and confirmed on repeat testing.  While 2019 novel coronavirus  (SARS-CoV-2)  nucleic acids may be present in the submitted sample  additional confirmatory testing may be necessary for epidemiological  and / or clinical management purposes  to differentiate between  SARS-CoV-2 and other Sarbecovirus currently known to infect humans.  If clinically indicated additional testing with an alternate test  methodology [phone]) is advised. The SARS-CoV-2 RNA is generally  detectable in upper and lower respiratory sp ecimens during the acute  phase of infection. The expected result is Negative. Fact Sheet for Patients:  StrictlyIdeas.no Fact Sheet for Healthcare Providers: BankingDealers.co.za This test is not yet approved or cleared by the [place] FDA and has been authorized for detection and/or diagnosis of SARS-CoV-2 by FDA under an Emergency Use Authorization (EUA).  This EUA will remain in effect (meaning this test can be used) for the duration of the COVID-19 declaration under Section 564(b)(1) of the Act, 21 U.S.C. section 360bbb-3(b)(1), unless the authorization is terminated or revoked sooner. Performed at [facility], [address] [place], [place] [address]    Studies/Results: Dg Chest 2 View  Result Date: 05/17/2019 CLINICAL DATA:  Vomiting blood.  Weakness, leukocytosis. EXAM: CHEST - 2 VIEW COMPARISON:  03/31/2019 FINDINGS: Heart is normal size. Patchy airspace opacities in the lower lobes bilaterally. No effusions. No acute bony abnormality. Old left rib fractures noted. IMPRESSION: Patchy bilateral lower lobe airspace opacities which could reflect multifocal pneumonia. Electronically Signed   By: Rolm Baptise M.D.   On: 05/17/2019 19:08   Medications:  I have reviewed the patient's current medications. Prior to Admission:  Medications Prior to Admission  Medication Sig Dispense Refill Last Dose   aspirin 81 MG chewable tablet Chew 1 tablet (81 mg total) by mouth daily. 30 tablet 0  05/16/2019 at 0830   atorvastatin (LIPITOR) 80 MG tablet Take 80 mg by mouth at bedtime.    05/15/2019 at Unknown time   clopidogrel (PLAVIX) 75 MG tablet Take 1 tablet (75 mg total) by mouth daily. 90 tablet 1 05/16/2019 at 0830   cyanocobalamin 100 MCG tablet Take 100 mcg by mouth daily.   05/16/2019 at Unknown time   docusate sodium (COLACE) 100 MG capsule Take 100 mg by mouth daily.    05/16/2019 at Unknown time   ferrous sulfate 325 (65 FE) MG tablet Take 1 tablet (325 mg total) by mouth daily with breakfast. 90 tablet 1 05/15/2019 at Unknown time   furosemide (LASIX) 20 MG tablet Take 0.5 tablets (10 mg total) by mouth daily. 90 tablet 3 05/16/2019 at Unknown time   JARDIANCE 10 MG TABS tablet Take 10 mg by mouth daily. 900 mg 0 05/16/2019 at Unknown time   losartan (COZAAR) 25 MG tablet Take 0.5 tablets (12.5 mg total) by mouth daily. 45 tablet 3 05/16/2019 at Unknown time   Melatonin 10 MG TABS Take 10 mg by  mouth at bedtime.    05/15/2019 at Unknown time   Multiple Vitamin (MULTIVITAMIN WITH MINERALS) TABS tablet Take 1 tablet by mouth daily. Multivitamin for Senior 50+   05/16/2019 at Unknown time   Omega-3 Fatty Acids (FISH OIL) 1000 MG CAPS Take 1,000 mg by mouth daily.   05/16/2019 at Unknown time   pantoprazole (PROTONIX) 40 MG tablet Take 1 tablet (40 mg total) by mouth 2 (two) times daily. 180 tablet 0 05/16/2019 at Unknown time   acetaminophen (TYLENOL) 500 MG tablet Take 1,000 mg by mouth every 6 (six) hours as needed (headache.).   PRN at PRN   traMADol (ULTRAM) 50 MG tablet Take 1 tablet (50 mg total) by mouth every 6 (six) hours as needed. (Patient taking differently: Take 50 mg by mouth every 6 (six) hours as needed (pain). ) 8 tablet 0 PRN at PRN   Scheduled:  atorvastatin  80 mg Oral QHS   ferrous sulfate  325 mg Oral Q breakfast   furosemide  10 mg Oral Daily   insulin aspart  0-5 Units Subcutaneous QHS   insulin aspart  0-9 Units Subcutaneous TID WC   Melatonin   10 mg Oral QHS   multivitamin with minerals  1 tablet Oral Daily   omega-3 acid ethyl esters  1 g Oral Daily   pantoprazole (PROTONIX) IV  40 mg Intravenous Q12H   Continuous:  ampicillin-sulbactam (UNASYN) IV 3 g (05/18/19 1204)   UJW:[id number]:acetaminophen **OR** acetaminophen, ondansetron **OR** ondansetron (ZOFRAN) IV, traMADol Anti-infectives (From admission, onward)   Start     Dose/Rate Route Frequency Ordered Stop   05/18/19 0900  Ampicillin-Sulbactam (UNASYN) 3 g in sodium chloride 0.9 % 100 mL IVPB     3 g 200 mL/hr over 30 Minutes Intravenous Every 6 hours 05/18/19 0834       Scheduled Meds:  atorvastatin  80 mg Oral QHS   ferrous sulfate  325 mg Oral Q breakfast   furosemide  10 mg Oral Daily   insulin aspart  0-5 Units Subcutaneous QHS   insulin aspart  0-9 Units Subcutaneous TID WC   Melatonin  10 mg Oral QHS   multivitamin with minerals  1 tablet Oral Daily   omega-3 acid ethyl esters  1 g Oral Daily   pantoprazole (PROTONIX) IV  40 mg Intravenous Q12H   Continuous Infusions:  ampicillin-sulbactam (UNASYN) IV 3 g (05/18/19 1204)   PRN Meds:.acetaminophen **OR** acetaminophen, ondansetron **OR** ondansetron (ZOFRAN) IV, traMADol   Assessment: Active Problems:   GI bleed  Hemoglobin is overall stable, no active GI bleed at this time Bilateral pulmonary infiltrates, leukocytosis, currently on Unasyn Patient is oxygenating well on room air and not in respiratory distress Severe iron deficiency anemia  Plan: Continue Protonix twice daily Continue liquid diet Patient is receiving parenteral iron Tentative plan for EGD tomorrow if cleared by anesthesia as long as his respiratory status is normal Called patient's wife and explained to her in detail why the procedure was canceled and updated his clinical status.  She expressed understanding of the plan Continue to hold Plavix N.p.o. past midnight   LOS: 2 days   Jamin Panther 05/18/2019, 6:40 PM

## 2019-05-18 NOTE — Progress Notes (Addendum)
Wife called for update. Update given.   Fuller Mandril, RN

## 2019-05-19 ENCOUNTER — Encounter
Admission: EM | Disposition: A | Discharge status: Home / Self Care | Payer: Self-pay | Source: Home / Self Care | Attending: Internal Medicine

## 2019-05-19 DIAGNOSIS — D500 Iron deficiency anemia secondary to blood loss (chronic): Secondary | ICD-10-CM

## 2019-05-19 LAB — GLUCOSE, CAPILLARY
Glucose-Capillary: 109 mg/dL — ABNORMAL HIGH (ref 70–99)
Glucose-Capillary: 91 mg/dL (ref 70–99)

## 2019-05-19 LAB — CBC WITH DIFFERENTIAL/PLATELET
Abs Immature Granulocytes: 0.05 10*3/uL (ref 0.00–0.07)
Basophils Absolute: 0 10*3/uL (ref 0.0–0.1)
Basophils Relative: 0 %
Eosinophils Absolute: 0.1 10*3/uL (ref 0.0–0.5)
Eosinophils Relative: 0 %
HCT: 30.4 % — ABNORMAL LOW (ref 39.0–52.0)
Hemoglobin: 9.2 g/dL — ABNORMAL LOW (ref 13.0–17.0)
Immature Granulocytes: 0 %
Lymphocytes Relative: 10 %
Lymphs Abs: 1.2 10*3/uL (ref 0.7–4.0)
MCH: 25.8 pg — ABNORMAL LOW (ref 26.0–34.0)
MCHC: 30.3 g/dL (ref 30.0–36.0)
MCV: 85.2 fL (ref 80.0–100.0)
Monocytes Absolute: 0.9 10*3/uL (ref 0.1–1.0)
Monocytes Relative: 8 %
Neutro Abs: 9.9 10*3/uL — ABNORMAL HIGH (ref 1.7–7.7)
Neutrophils Relative %: 82 %
Platelets: 157 10*3/uL (ref 150–400)
RBC: 3.57 MIL/uL — ABNORMAL LOW (ref 4.22–5.81)
RDW: 22.6 % — ABNORMAL HIGH (ref 11.5–15.5)
WBC: 12.2 10*3/uL — ABNORMAL HIGH (ref 4.0–10.5)
nRBC: 0 % (ref 0.0–0.2)

## 2019-05-19 SURGERY — EGD (ESOPHAGOGASTRODUODENOSCOPY)
Anesthesia: General

## 2019-05-19 MED ORDER — AMOXICILLIN-POT CLAVULANATE 875-125 MG PO TABS: 1.0000 | ORAL_TABLET | Freq: Two times a day (BID) | ORAL | 0 refills | Status: AC

## 2019-05-19 MED ORDER — ASPIRIN EC 81 MG PO TBEC: 81.0000 mg | DELAYED_RELEASE_TABLET | Freq: Every day | ORAL | Status: DC

## 2019-05-19 MED ORDER — FERROUS SULFATE 325 (65 FE) MG PO TBEC: 325.0000 mg | DELAYED_RELEASE_TABLET | Freq: Two times a day (BID) | ORAL | 3 refills | Status: DC

## 2019-05-19 MED ORDER — OMEPRAZOLE 40 MG PO CPDR: 40.0000 mg | DELAYED_RELEASE_CAPSULE | Freq: Two times a day (BID) | ORAL | 0 refills | Status: DC

## 2019-05-19 NOTE — Progress Notes (Signed)
Kevin Case to be D/C'd home per MD order.  Discussed prescriptions and follow up appointments with the patient. Prescriptions given to patient, medication list explained in detail. Pt verbalized understanding.  Allergies as of 05/19/2019   No Known Allergies     Medication List    STOP taking these medications   clopidogrel 75 MG tablet Commonly known as: PLAVIX   ferrous sulfate 325 (65 FE) MG tablet Replaced by: ferrous sulfate 325 (65 FE) MG EC tablet   pantoprazole 40 MG tablet Commonly known as: PROTONIX     TAKE these medications   acetaminophen 500 MG tablet Commonly known as: TYLENOL Take 1,000 mg by mouth every 6 (six) hours as needed (headache.).   amoxicillin-clavulanate 875-125 MG tablet Commonly known as: Augmentin Take 1 tablet by mouth 2 (two) times daily for 4 days.   aspirin 81 MG chewable tablet Chew 1 tablet (81 mg total) by mouth daily.   atorvastatin 80 MG tablet Commonly known as: LIPITOR Take 80 mg by mouth at bedtime.   cyanocobalamin 100 MCG tablet Take 100 mcg by mouth daily.   docusate sodium 100 MG capsule Commonly known as: COLACE Take 100 mg by mouth daily.   ferrous sulfate 325 (65 FE) MG EC tablet Take 1 tablet (325 mg total) by mouth 2 (two) times daily. Replaces: ferrous sulfate 325 (65 FE) MG tablet   Fish Oil 1000 MG Caps Take 1,000 mg by mouth daily.   furosemide 20 MG tablet Commonly known as: LASIX Take 0.5 tablets (10 mg total) by mouth daily.   Jardiance 10 MG Tabs tablet Generic drug: empagliflozin Take 10 mg by mouth daily.   losartan 25 MG tablet Commonly known as: COZAAR Take 0.5 tablets (12.5 mg total) by mouth daily.   Melatonin 10 MG Tabs Take 10 mg by mouth at bedtime.   multivitamin with minerals Tabs tablet Take 1 tablet by mouth daily. Multivitamin for Senior 50+   omeprazole 40 MG capsule Commonly known as: PriLOSEC Take 1 capsule (40 mg total) by mouth 2 (two) times a day.   traMADol 50 MG  tablet Commonly known as: Ultram Take 1 tablet (50 mg total) by mouth every 6 (six) hours as needed. What changed: reasons to take this       Vitals:   05/19/19 1218 05/19/19 1220  BP: (!) 88/55 (!) 132/47  Pulse: (!) 51 (!) 56  Resp: 17   Temp: 99.3 F (37.4 C)   SpO2: 96%     Skin clean, dry and intact without evidence of skin break down, no evidence of skin tears noted. IV catheter discontinued intact. Site without signs and symptoms of complications. Dressing and pressure applied. Pt denies pain at this time. No complaints noted.  An After Visit Summary was printed and given to the patient. Patient escorted via [place], and D/C home via private auto. Instructions reviewed with wife and patient. Wife has been very emotional and has cried during all of our conversations. I ensured her that her husband is stable for discharge and we have made all needed follow up appointments.   Kevin Case A Kevin Case

## 2019-05-19 NOTE — Progress Notes (Signed)
Kevin Case R Kevin Miles, MD [address]  [address]  [place], [place] [id number]  Main: [phone]  Fax: [phone] Pager: [phone]   Subjective: No acute events overnight.  Patient did not have any further episodes of hematemesis since admission.  He reports having mild epigastric discomfort.  He denies shortness of breath, chest pain.  Has been afebrile  Objective: Vital signs in last 24 hours: Vitals:   05/18/19 1217 05/18/19 2005 05/19/19 0434 05/19/19 0442  BP: (!) 158/66 (!) 141/58 (!) 89/53 95/62  Pulse: 67 70 64   Resp: 20 18 18    Temp: 99 F (37.2 C) 98.5 F (36.9 C) 99 F (37.2 C)   TempSrc: Oral Oral Oral   SpO2: 95% 92% 94%   Weight:      Height:       Weight change:   Intake/Output Summary (Last 24 hours) at 05/19/2019 1144 Last data filed at 05/19/2019 0910 Gross per 24 hour  Intake 1080 ml  Output 650 ml  Net 430 ml     Exam: Heart:: Regular rate and rhythm or S1S2 present Lungs: normal and clear to auscultation Abdomen: soft, nontender, normal bowel sounds   Lab Results: CBC Latest Ref Rng & Units 05/19/2019 05/18/2019 05/17/2019  WBC 4.0 - 10.5 K/uL 12.2(H) 13.6(H) 19.0(H)  Hemoglobin 13.0 - 17.0 g/dL 5.6(E9.2(L) 3.3(I9.9(L) 10.3(L)  Hematocrit 39.0 - 52.0 % 30.4(L) 33.0(L) 34.1(L)  Platelets 150 - 400 K/uL 157 172 195   CMP Latest Ref Rng & Units 05/17/2019 05/16/2019 05/04/2019  Glucose 70 - 99 mg/dL 951(O176(H) 841(Y171(H) 606(T110(H)  BUN 8 - 23 mg/dL 01(S28(H) 01(U26(H) 26  Creatinine 0.61 - 1.24 mg/dL 9.321.15 3.551.23 7.32(K1.36(H)  Sodium 135 - 145 mmol/L 142 137 138  Potassium 3.5 - 5.1 mmol/L 4.0 3.8 4.3  Chloride 98 - 111 mmol/L 111 104 103  CO2 22 - 32 mmol/L 23 25 21   Calcium 8.9 - 10.3 mg/dL 9.0 9.3 9.3  Total Protein 6.5 - 8.1 g/dL - 7.9 7.2  Total Bilirubin 0.3 - 1.2 mg/dL - 0.8 0.3  Alkaline Phos 38 - 126 U/L - 65 72  AST 15 - 41 U/L - 39 41(H)  ALT 0 - 44 U/L - 31 32    Micro Results: Recent Results (from the past 240 hour(s))  SARS Coronavirus 2 (CEPHEID -  Performed in [facility] lab), Hosp Order     Status: None   Collection Time: 05/16/19  6:40 PM   Specimen: Nasopharyngeal Swab  Result Value Ref Range Status   SARS Coronavirus 2 NEGATIVE NEGATIVE Final    Comment: (NOTE) If result is NEGATIVE SARS-CoV-2 target nucleic acids are NOT DETECTED. The SARS-CoV-2 RNA is generally detectable in upper and lower  respiratory specimens during the acute phase of infection. The lowest  concentration of SARS-CoV-2 viral copies this assay can detect is 250  copies / mL. A negative result does not preclude SARS-CoV-2 infection  and should not be used as the sole basis for treatment or other  patient management decisions.  A negative result may occur with  improper specimen collection / handling, submission of specimen other  than nasopharyngeal swab, presence of viral mutation(s) within the  areas targeted by this assay, and inadequate number of viral copies  (<250 copies / mL). A negative result must be combined with clinical  observations, patient history, and epidemiological information. If result is POSITIVE SARS-CoV-2 target nucleic acids are DETECTED. The SARS-CoV-2 RNA is generally detectable in upper and  lower  respiratory specimens dur ing the acute phase of infection.  Positive  results are indicative of active infection with SARS-CoV-2.  Clinical  correlation with patient history and other diagnostic information is  necessary to determine patient infection status.  Positive results do  not rule out bacterial infection or co-infection with other viruses. If result is PRESUMPTIVE POSTIVE SARS-CoV-2 nucleic acids MAY BE PRESENT.   A presumptive positive result was obtained on the submitted specimen  and confirmed on repeat testing.  While 2019 novel coronavirus  (SARS-CoV-2) nucleic acids may be present in the submitted sample  additional confirmatory testing may be necessary for epidemiological  and / or clinical management  purposes  to differentiate between  SARS-CoV-2 and other Sarbecovirus currently known to infect humans.  If clinically indicated additional testing with an alternate test  methodology [phone]) is advised. The SARS-CoV-2 RNA is generally  detectable in upper and lower respiratory sp ecimens during the acute  phase of infection. The expected result is Negative. Fact Sheet for Patients:  StrictlyIdeas.no Fact Sheet for Healthcare Providers: BankingDealers.co.za This test is not yet approved or cleared by the [place] FDA and has been authorized for detection and/or diagnosis of SARS-CoV-2 by FDA under an Emergency Use Authorization (EUA).  This EUA will remain in effect (meaning this test can be used) for the duration of the COVID-19 declaration under Section 564(b)(1) of the Act, 21 U.S.C. section 360bbb-3(b)(1), unless the authorization is terminated or revoked sooner. Performed at [facility], [address] [place], [place] [address]    Studies/Results: Dg Chest 2 View  Result Date: 05/17/2019 CLINICAL DATA:  Vomiting blood.  Weakness, leukocytosis. EXAM: CHEST - 2 VIEW COMPARISON:  03/31/2019 FINDINGS: Heart is normal size. Patchy airspace opacities in the lower lobes bilaterally. No effusions. No acute bony abnormality. Old left rib fractures noted. IMPRESSION: Patchy bilateral lower lobe airspace opacities which could reflect multifocal pneumonia. Electronically Signed   By: Rolm Baptise M.D.   On: 05/17/2019 19:08   Medications:  I have reviewed the patient's current medications. Prior to Admission:  Medications Prior to Admission  Medication Sig Dispense Refill Last Dose  . aspirin 81 MG chewable tablet Chew 1 tablet (81 mg total) by mouth daily. 30 tablet 0 05/16/2019 at 0830  . atorvastatin (LIPITOR) 80 MG tablet Take 80 mg by mouth at bedtime.    05/15/2019 at Unknown time  . clopidogrel (PLAVIX) 75 MG tablet  Take 1 tablet (75 mg total) by mouth daily. 90 tablet 1 05/16/2019 at 0830  . cyanocobalamin 100 MCG tablet Take 100 mcg by mouth daily.   05/16/2019 at Unknown time  . docusate sodium (COLACE) 100 MG capsule Take 100 mg by mouth daily.    05/16/2019 at Unknown time  . ferrous sulfate 325 (65 FE) MG tablet Take 1 tablet (325 mg total) by mouth daily with breakfast. 90 tablet 1 05/15/2019 at Unknown time  . furosemide (LASIX) 20 MG tablet Take 0.5 tablets (10 mg total) by mouth daily. 90 tablet 3 05/16/2019 at Unknown time  . JARDIANCE 10 MG TABS tablet Take 10 mg by mouth daily. 900 mg 0 05/16/2019 at Unknown time  . losartan (COZAAR) 25 MG tablet Take 0.5 tablets (12.5 mg total) by mouth daily. 45 tablet 3 05/16/2019 at Unknown time  . Melatonin 10 MG TABS Take 10 mg by mouth at bedtime.    05/15/2019 at Unknown time  . Multiple Vitamin (MULTIVITAMIN WITH MINERALS) TABS tablet Take 1 tablet  by mouth daily. Multivitamin for Senior 50+   05/16/2019 at Unknown time  . Omega-3 Fatty Acids (FISH OIL) 1000 MG CAPS Take 1,000 mg by mouth daily.   05/16/2019 at Unknown time  . pantoprazole (PROTONIX) 40 MG tablet Take 1 tablet (40 mg total) by mouth 2 (two) times daily. 180 tablet 0 05/16/2019 at Unknown time  . acetaminophen (TYLENOL) 500 MG tablet Take 1,000 mg by mouth every 6 (six) hours as needed (headache.).   PRN at PRN  . traMADol (ULTRAM) 50 MG tablet Take 1 tablet (50 mg total) by mouth every 6 (six) hours as needed. (Patient taking differently: Take 50 mg by mouth every 6 (six) hours as needed (pain). ) 8 tablet 0 PRN at PRN   Scheduled: . aspirin EC  81 mg Oral Daily  . atorvastatin  80 mg Oral QHS  . ferrous sulfate  325 mg Oral Q breakfast  . insulin aspart  0-5 Units Subcutaneous QHS  . insulin aspart  0-9 Units Subcutaneous TID WC  . Melatonin  10 mg Oral QHS  . multivitamin with minerals  1 tablet Oral Daily  . omega-3 acid ethyl esters  1 g Oral Daily  . pantoprazole (PROTONIX) IV  40 mg  Intravenous Q12H   Continuous: . ampicillin-sulbactam (UNASYN) IV 3 g (05/19/19 0910)   [id number] **OR** acetaminophen, ondansetron **OR** ondansetron (ZOFRAN) IV, traMADol Anti-infectives (From admission, onward)   Start     Dose/Rate Route Frequency Ordered Stop   05/18/19 0900  Ampicillin-Sulbactam (UNASYN) 3 g in sodium chloride 0.9 % 100 mL IVPB     3 g 200 mL/hr over 30 Minutes Intravenous Every 6 hours 05/18/19 0834       Scheduled Meds: . aspirin EC  81 mg Oral Daily  . atorvastatin  80 mg Oral QHS  . ferrous sulfate  325 mg Oral Q breakfast  . insulin aspart  0-5 Units Subcutaneous QHS  . insulin aspart  0-9 Units Subcutaneous TID WC  . Melatonin  10 mg Oral QHS  . multivitamin with minerals  1 tablet Oral Daily  . omega-3 acid ethyl esters  1 g Oral Daily  . pantoprazole (PROTONIX) IV  40 mg Intravenous Q12H   Continuous Infusions: . ampicillin-sulbactam (UNASYN) IV 3 g (05/19/19 0910)   PRN Meds:.acetaminophen **OR** acetaminophen, ondansetron **OR** ondansetron (ZOFRAN) IV, traMADol   Assessment: Active Problems:   GI bleed  Hemoglobin is overall stable, no active GI bleed at this time Bilateral pulmonary infiltrates, leukocytosis, currently on Unasyn Improving leukocytosis Patient is oxygenating well on room air and not in respiratory distress Severe iron deficiency anemia status post IV iron  Plan: Recommend prilosec 40mg  BID upon discharge Resume diet Patient received parenteral iron as inpatient Continue oral iron as outpatient Okay to hold Plavix per cardiology, discussed with patient's cardiologist, Dr. Kirke Corin Restarted aspirin 81mg  daily Plan to perform EGD as outpatient most likely Thursday next week I will follow-up with him on virtual visit prior to EGD  Patient can be discharged home today   LOS: 3 days   Doren Kaspar 05/19/2019, 11:44 AM

## 2019-05-19 NOTE — Care Management Important Message (Signed)
Important Message  Patient Details  Name: Kevin Case MRN: [id number] Date of Birth: Nov 17, 1940   Medicare Important Message Given:  Yes     Dannette Barbara 05/19/2019, 11:17 AM

## 2019-05-19 NOTE — Telephone Encounter (Signed)
Patient's wife called & would like to talk with dr Marius Ditch about th Patient. Please call she is very anxious!!!

## 2019-05-19 NOTE — Discharge Instructions (Signed)
Resume diet and activity as before ° ° °

## 2019-05-20 ENCOUNTER — Ambulatory Visit: Payer: Medicare Other | Admitting: Cardiovascular Disease

## 2019-05-20 NOTE — Discharge Summary (Signed)
[facility] [place] [facility]   PATIENT NAME: Kevin Case    MR#:  [id number]  DATE OF BIRTH:  11/21/1940  DATE OF ADMISSION:  05/16/2019 ADMITTING PHYSICIAN: Houston SirenVivek J Sainani, MD  DATE OF DISCHARGE: 05/19/2019  3:32 PM  PRIMARY CARE PHYSICIAN: Trey SailorsPollak, Adriana M, PA-C   ADMISSION DIAGNOSIS:  Upper GI bleed [K92.2]  DISCHARGE DIAGNOSIS:  Active Problems:   GI bleed   SECONDARY DIAGNOSIS:   Past Medical History:  Diagnosis Date  . Anemia   . Blood transfusion without reported diagnosis   . CAD (coronary artery disease)    a. 03/2019 Cath: LM nl, LAD 50p CA2+, 4817m, D1 50ost, LCX 20p, 5173m, RCA small, mild diff dzs-->Med rx. Consider PCI LCX for refractory angina.  . Carotid arterial disease (HCC)    a. 03/2019 Carotid U/S: RICA 1-39%, RECA <50%, LICA 1-39%, LECA <50%.  . CHF (congestive heart failure) (HCC)   . Clotting disorder (HCC)   . Diabetes mellitus without complication (HCC)   . GIB (gastrointestinal bleeding)    a. 03/2019 BRBPR following PV procedure req anticoagulation-->2u PRBCs.  . Heart murmur   . HFrEF (heart failure with reduced ejection fraction) (HCC)    a. 03/2019 Echo: EF 30-35%, mild conc LVH. Mildly dil LA. Mod MV annular dil. Mod AS (mean grad 18mmHg, Valve area 0.76).  Marland Kitchen. History of tobacco abuse   . Hypertension   . Ischemic cardiomyopathy    a. 03/2019 Echo: EF 30-35%.  . Left bundle branch block   . Moderate aortic stenosis    a. 03/2019 Echo:  Mod AS (mean grad 18mmHg, Valve area 0.76).  Marland Kitchen. PAD (peripheral artery disease) (HCC)    a. 03/2019 PV Angio: RCFA 80, RSFA 100.  . Rib fracture   . Sinus bradycardia   . Subclavian arterial stenosis (HCC)    a. 03/2019 PV Angio: RSCA 90 after origin of CCA. RCCA 30ost, LSCA 80 (PTA and covered stenting).     ADMITTING HISTORY  Kevin Case  is a 78 y.o. male with a known history of coronary artery disease, carotid artery disease, diabetes, previous history of GI bleeding ischemic  cardiomyopathy ejection fraction of 30 to 35%, aortic stenosis, peripheral artery disease, previous history of subclavian stenosis who presents to the hospital due to multiple episodes of nausea and vomiting and the vomiting turning more bloody/bright red in color over the past 24 hours.  Patient says he has been having intractable nausea vomiting for the past 2 to 3 days and today his vomiting was more blood-tinged and therefore he was concerned and came to the ER for further evaluation.  Patient had his vomitus in the ER hemocculted and it was positive.  Patient denies any abdominal pain, fever chills cough chest pain shortness of breath or any other associated symptoms.  Given patient's persistent nausea vomiting and suspicious for having upper GI bleed hospital services were contacted for admission.  Patient denies any recent travel history, fever, cough, loss of taste or any other associated symptoms.  Patient's COVID-19 test still pending.  HOSPITAL COURSE:   78 y.o.malewith a known history of coronary artery disease, carotid artery disease, diabetes, previous history of GI bleeding ischemic cardiomyopathy ejection fraction of 30 to 35%, aortic stenosis, peripheral artery disease, previous history of subclavian stenosis who presents to the hospital due to multiple episodes of nausea and vomiting and the vomiting turning more bloody/bright red in color over the past 24 hours  *GI bleed-suspected to be  upper GI bleed givenpatient's bright red blood/mild hematemesis. -Placed on Protonix twice daily - Discussed with Dr. Allegra LaiVanga of GI.  EGD was scheduled but anesthesia team recommended outpatient EGD after her pneumonia improves.  Discussed with patient's cardiologist Dr. Kirke CorinArida.  Advised discontinuing Plavix and restarting aspirin once bleeding resolves.  Bleeding resolved with stable hemoglobin.  Resumed aspirin  *Aspiration pneumonia Bibasilar infiltrates.  No fever or shortness of breath.   Has left basal rhonchi. Due to leukocytosis started IV antibiotics. Changed to oral Augmentin at discharge  * Essential hypertension-patient's blood pressure is a bit on the low side. Hold his antihypertensives for now.  * Diabetes-hold patient's Jardiance. SSI  * History of peripheral vascular disease/carotid artery disease-continue atorvastatin. Hold aspirin and Plavix given the GI bleed.  * History of chronic systolic CHF -Continue Lasix.  Stable for discharge home  CONSULTS OBTAINED:  Treatment Team:  Wyline MoodAnna, Kiran, MD  DRUG ALLERGIES:  No Known Allergies  DISCHARGE MEDICATIONS:   Allergies as of 05/19/2019   No Known Allergies     Medication List    STOP taking these medications   clopidogrel 75 MG tablet Commonly known as: PLAVIX   ferrous sulfate 325 (65 FE) MG tablet Replaced by: ferrous sulfate 325 (65 FE) MG EC tablet   pantoprazole 40 MG tablet Commonly known as: PROTONIX     TAKE these medications   acetaminophen 500 MG tablet Commonly known as: TYLENOL Take 1,000 mg by mouth every 6 (six) hours as needed (headache.).   amoxicillin-clavulanate 875-125 MG tablet Commonly known as: Augmentin Take 1 tablet by mouth 2 (two) times daily for 4 days.   aspirin 81 MG chewable tablet Chew 1 tablet (81 mg total) by mouth daily.   atorvastatin 80 MG tablet Commonly known as: LIPITOR Take 80 mg by mouth at bedtime.   cyanocobalamin 100 MCG tablet Take 100 mcg by mouth daily.   docusate sodium 100 MG capsule Commonly known as: COLACE Take 100 mg by mouth daily.   ferrous sulfate 325 (65 FE) MG EC tablet Take 1 tablet (325 mg total) by mouth 2 (two) times daily. Replaces: ferrous sulfate 325 (65 FE) MG tablet   Fish Oil 1000 MG Caps Take 1,000 mg by mouth daily.   furosemide 20 MG tablet Commonly known as: LASIX Take 0.5 tablets (10 mg total) by mouth daily.   Jardiance 10 MG Tabs tablet Generic drug: empagliflozin Take 10 mg by  mouth daily.   losartan 25 MG tablet Commonly known as: COZAAR Take 0.5 tablets (12.5 mg total) by mouth daily.   Melatonin 10 MG Tabs Take 10 mg by mouth at bedtime.   multivitamin with minerals Tabs tablet Take 1 tablet by mouth daily. Multivitamin for Senior 50+   omeprazole 40 MG capsule Commonly known as: PriLOSEC Take 1 capsule (40 mg total) by mouth 2 (two) times a day.   traMADol 50 MG tablet Commonly known as: Ultram Take 1 tablet (50 mg total) by mouth every 6 (six) hours as needed. What changed: reasons to take this       Today   VITAL SIGNS:  Blood pressure (!) 132/47, pulse (!) 56, temperature 99.3 F (37.4 C), temperature source Oral, resp. rate 17, height 5\' 9"  (1.753 m), weight 63.4 kg, SpO2 96 %.  I/O:  No intake or output data in the 24 hours ending 05/20/19 1604  PHYSICAL EXAMINATION:  Physical Exam  GENERAL:  78 y.o.-year-old patient lying in the bed with no acute distress.  LUNGS: Normal breath sounds bilaterally, no wheezing, rales,rhonchi or crepitation. No use of accessory muscles of respiration.  CARDIOVASCULAR: S1, S2 normal. No murmurs, rubs, or gallops.  ABDOMEN: Soft, non-tender, non-distended. Bowel sounds present. No organomegaly or mass.  NEUROLOGIC: Moves all 4 extremities. PSYCHIATRIC: The patient is alert and oriented x 3.  SKIN: No obvious rash, lesion, or ulcer.   DATA REVIEW:   CBC Recent Labs  Lab 05/19/19 0623  WBC 12.2*  HGB 9.2*  HCT 30.4*  PLT 157    Chemistries  Recent Labs  Lab 05/16/19 1632 05/17/19 0410  NA 137 142  K 3.8 4.0  CL 104 111  CO2 25 23  GLUCOSE 171* 176*  BUN 26* 28*  CREATININE 1.23 1.15  CALCIUM 9.3 9.0  AST 39  --   ALT 31  --   ALKPHOS 65  --   BILITOT 0.8  --     Cardiac Enzymes No results for input(s): TROPONINI in the last 168 hours.  Microbiology Results  Results for orders placed or performed during the hospital encounter of 05/16/19  SARS Coronavirus 2 (CEPHEID -  Performed in [facility] hospital lab), Hosp Order     Status: None   Collection Time: 05/16/19  6:40 PM   Specimen: Nasopharyngeal Swab  Result Value Ref Range Status   SARS Coronavirus 2 NEGATIVE NEGATIVE Final    Comment: (NOTE) If result is NEGATIVE SARS-CoV-2 target nucleic acids are NOT DETECTED. The SARS-CoV-2 RNA is generally detectable in upper and lower  respiratory specimens during the acute phase of infection. The lowest  concentration of SARS-CoV-2 viral copies this assay can detect is 250  copies / mL. A negative result does not preclude SARS-CoV-2 infection  and should not be used as the sole basis for treatment or other  patient management decisions.  A negative result may occur with  improper specimen collection / handling, submission of specimen other  than nasopharyngeal swab, presence of viral mutation(s) within the  areas targeted by this assay, and inadequate number of viral copies  (<250 copies / mL). A negative result must be combined with clinical  observations, patient history, and epidemiological information. If result is POSITIVE SARS-CoV-2 target nucleic acids are DETECTED. The SARS-CoV-2 RNA is generally detectable in upper and lower  respiratory specimens dur ing the acute phase of infection.  Positive  results are indicative of active infection with SARS-CoV-2.  Clinical  correlation with patient history and other diagnostic information is  necessary to determine patient infection status.  Positive results do  not rule out bacterial infection or co-infection with other viruses. If result is PRESUMPTIVE POSTIVE SARS-CoV-2 nucleic acids MAY BE PRESENT.   A presumptive positive result was obtained on the submitted specimen  and confirmed on repeat testing.  While 2019 novel coronavirus  (SARS-CoV-2) nucleic acids may be present in the submitted sample  additional confirmatory testing may be necessary for epidemiological  and / or clinical management  purposes  to differentiate between  SARS-CoV-2 and other Sarbecovirus currently known to infect humans.  If clinically indicated additional testing with an alternate test  methodology [id number]) is advised. The SARS-CoV-2 RNA is generally  detectable in upper and lower respiratory sp ecimens during the acute  phase of infection. The expected result is Negative. Fact Sheet for Patients:  BoilerBrush.com.cy Fact Sheet for Healthcare Providers: https://pope.com/ This test is not yet approved or cleared by the [place] FDA and has been authorized for detection and/or diagnosis of SARS-CoV-2  by FDA under an Emergency Use Authorization (EUA).  This EUA will remain in effect (meaning this test can be used) for the duration of the COVID-19 declaration under Section 564(b)(1) of the Act, 21 U.S.C. section 360bbb-3(b)(1), unless the authorization is terminated or revoked sooner. Performed at [facility], [address] [place],  [address]     RADIOLOGY:  No results found.  Follow up with PCP in 1 week.  Management plans discussed with the patient, family and they are in agreement.  CODE STATUS:  Code Status History    Date Active Date Inactive Code Status Order ID Comments User Context   05/16/2019 2046 05/19/2019 1911 Full Code [id number]  Henreitta Leber, MD Inpatient   03/31/2019 1133 04/02/2019 1554 Full Code [id number]  Wellington Hampshire, MD Inpatient   03/12/2019 2038 03/16/2019 1519 Partial Code [id number]  Mayo, Pete Pelt, MD Inpatient   Advance Care Planning Activity      TOTAL TIME TAKING CARE OF THIS PATIENT ON DAY OF DISCHARGE: more than 30 minutes.   Leia Alf Ghazal Pevey M.D on 05/20/2019 at 4:04 PM  Between 7am to 6pm - Pager - [phone]  After 6pm go to [email or web address] - password [email or web address] [facility]  Office  [phone]  CC: Primary care physician; Trinna Post, PA-C  Note: This  dictation was prepared with Dragon dictation along with smaller phrase technology. Any transcriptional errors that result from this process are unintentional.

## 2019-05-21 ENCOUNTER — Encounter: Payer: Self-pay | Admitting: Physician Assistant

## 2019-05-21 ENCOUNTER — Encounter: Payer: Self-pay | Admitting: Gastroenterology

## 2019-05-21 ENCOUNTER — Other Ambulatory Visit: Payer: Self-pay

## 2019-05-21 NOTE — Telephone Encounter (Signed)
Pt wife left vm she states pt is supposed to have another procedure and needs a Screening she needs to know when that is? She was also told to stop rx  And she needs  Know if he needs to continue rx Amoxicillon please call pt

## 2019-05-21 NOTE — Telephone Encounter (Signed)
He received 3 days broad spectrum IV antibiotics for pneumonia while in the hospital and was changed to augmentin for pneumonia at discharge for four days. This is enough of the antibiotics and he should not need a refill of this for his procedure on 05/27/2019, which is an EGD to check on bleeding.

## 2019-05-21 NOTE — Telephone Encounter (Signed)
Don't call, I have messaged this patient via Bud.

## 2019-05-21 NOTE — Telephone Encounter (Signed)
Patient wife Kevin Case) called requesting a refill on Augmentin due to patient having a procedure on 05/27/2019 and patient has pneumonia surgery had to be push back.Please advise.

## 2019-05-24 ENCOUNTER — Other Ambulatory Visit
Admission: RE | Admit: 2019-05-24 | Discharge: 2019-05-24 | Disposition: A | Discharge status: Home / Self Care | Payer: Medicare Other | Source: Ambulatory Visit | Attending: Gastroenterology | Admitting: Gastroenterology

## 2019-05-24 ENCOUNTER — Other Ambulatory Visit: Payer: Self-pay

## 2019-05-24 DIAGNOSIS — Z1159 Encounter for screening for other viral diseases: Secondary | ICD-10-CM | POA: Insufficient documentation

## 2019-05-24 LAB — SARS CORONAVIRUS 2 (TAT 6-24 HRS): SARS Coronavirus 2: NEGATIVE

## 2019-05-25 NOTE — Patient Instructions (Signed)
Acute Coronary Syndrome °Acute coronary syndrome (ACS) is a serious problem in which there is suddenly not enough blood and oxygen reaching the heart. ACS can result in chest pain or a heart attack. °This condition is a medical emergency. If you have any symptoms of this condition, get help right away. °What are the causes? °This condition may be caused by: °· A buildup of fat and cholesterol inside the arteries (atherosclerosis). This is the most common cause. The buildup (plaque) can cause blood vessels in the heart (coronary arteries) to become narrow or blocked, which reduces blood flow to the heart. Plaque can also break off and lead to a clot, which can block an artery and cause a heart attack or stroke. °· Sudden tightening of the muscles around the coronary arteries (coronary spasm). °· Tearing of a coronary artery (spontaneous coronary artery dissection). °· Very low blood pressure (hypotension). °· An abnormal heartbeat (arrhythmia). °· Other medical conditions that cause a decrease of oxygen to the heart, such as anemiaorrespiratory failure. °· Using cocaine or methamphetamine. °What increases the risk? °The following factors may make you more likely to develop this condition: °· Age. The risk for ACS increases as you get older. °· History of chest pain, heart attack, peripheral artery disease, or stroke. °· Having taken chemotherapy or immune-suppressing medicines. °· Being male. °· Family history of chest pain, heart disease, or stroke. °· Smoking. °· Not exercising enough. °· Being overweight. °· High cholesterol. °· High blood pressure (hypertension). °· Diabetes. °· Excessive alcohol use. °What are the signs or symptoms? °Common symptoms of this condition include: °· Chest pain. The pain may last a long time, or it may stop and come back (recur). It may feel like: °? Crushing or squeezing. °? Tightness, pressure, fullness, or heaviness. °· Arm, neck, jaw, or back pain. °· Heartburn or  indigestion. °· Shortness of breath. °· Nausea. °· Sudden cold sweats. °· Light-headedness. °· Dizziness or passing out. °· Tiredness (fatigue). °Sometimes there are no symptoms. °How is this diagnosed? °This condition may be diagnosed based on: °· Your medical history and symptoms. °· Imaging tests, such as: °? An electrocardiogram (ECG). This measures the heart's electrical activity. °? X-rays. °? CT scan. °? A coronary angiogram. For this test, dye is injected into the heart arteries and then X-rays are taken. °? Myocardial perfusion imaging. This test shows how well blood flows through your heart muscle. °· Blood tests. These may be repeated at certain time intervals. °· Exercise stress testing. °· Echocardiogram. This is a test that uses sound waves to produce detailed images of the heart. °How is this treated? °Treatment for this condition may include: °· Oxygen therapy. °· Medicines, such as: °? Antiplatelet medicines and blood-thinning medicines, such as aspirin. These help prevent blood clots. °? Medicine that dissolves any blood clots (fibrinolytic therapy). °? Blood pressure medicines. °? Nitroglycerin. This helps widen blood vessels to improve blood flow. °? Pain medicine. °? Cholesterol-lowering medicine. °· Surgery, such as: °? Coronary angioplasty with stent placement. This involves placing a small piece of metal that looks like mesh or a spring into a narrow coronary artery. This widens the artery and keeps it open. °? Coronary artery bypass surgery. This involves taking a section of a blood vessel from a different part of your body and placing it on the blocked coronary artery to allow blood to flow around the blockage. °· Cardiac rehabilitation. This is a program that includes exercise training, education, and counseling to help you recover. °  Follow these instructions at home: °Eating and drinking °· Eat a heart-healthy diet that includes whole grains, fruits and vegetables, lean proteins, and  low-fat or nonfat dairy products. °· Limit how much salt (sodium) you eat as told by your health care provider. Follow instructions from your health care provider about any other eating or drinking restrictions, such as limiting foods that are high in fat and processed sugars. °· Use healthy cooking methods such as roasting, grilling, broiling, baking, poaching, steaming, or stir-frying. °· Work with a dietitian to follow a heart-healthy eating plan. °Medicines °· Take over-the-counter and prescription medicines only as told by your health care provider. °· Do not take these medicines unless your health care provider approves: °? Vitamin supplements that contain vitamin A or vitamin E. °? NSAIDs, such as ibuprofen, naproxen, or celecoxib. °? Hormone replacement therapy that contains estrogen. °· If you are taking blood thinners: °? Talk with your health care provider before you take any medicines that contain aspirin or NSAIDs. These medicines increase your risk for dangerous bleeding. °? Take your medicine exactly as told, at the same time every day. °? Avoid activities that could cause injury or bruising, and follow instructions about how to prevent falls. °? Wear a medical alert bracelet, and carry a card that lists what medicines you take. °Activity °· Follow your cardiac rehabilitation program. Do exercises as told by your physical therapist. °· Ask your health care provider what activities and exercises are safe for you. Follow his or her instructions about lifting, driving, or climbing stairs. °Lifestyle °· Do not use any products that contain nicotine or tobacco, such as cigarettes, e-cigarettes, and chewing tobacco. If you need help quitting, ask your health care provider. °· Do not drink alcohol if your health care provider tells you not to drink. °· If you drink alcohol: °? Limit how much you have to 0-1 drink a day. °? Be aware of how much alcohol is in your drink. In the [place] one drink equals one 12 oz  bottle of beer (355 mL), one 5 oz glass of wine (148 mL), or one 1½ oz glass of hard liquor (44 mL). °· Maintain a healthy weight. If you need to lose weight, work with your health care provider to do so safely. °General instructions °· Tell all the health care providers who provide care for you about your heart condition, including your dentist. This may affect the medicines or treatment you receive. °· Manage any other health conditions you have, such as hypertension or diabetes. These conditions affect your heart. °· Pay attention to your mental health. You may be at higher risk for depression. °? Find ways to manage stress. °? Talk to your health care provider about depression screening and treatment. °· Keep your vaccinations up to date. °? Get the flu shot (influenza vaccine) every year. °? Get the pneumococcal vaccine if you are age 65 or older. °· If directed, monitor your blood pressure at home. °· Keep all follow-up visits as told by your health care provider. This is important. °Contact a health care provider if you: °· Feel overwhelmed or sad. °· Have trouble doing your daily activities. °Get help right away if you: °· Have pain in your chest, neck, arm, jaw, stomach, or back that recurs, and: °? It lasts for more than a few minutes. °? It is not relieved by taking the medicineyour health care provider prescribed. °· Have unexplained: °? Heavy sweating. °? Heartburn or indigestion. °? Nausea or vomiting. °?   Shortness of breath. °? Difficulty breathing. °? Fatigue. °? Nervousness or anxiety. °? Weakness. °? Diarrhea. °? Dark stools or blood in your stool. °· Have sudden light-headedness or dizziness. °· Have blood pressure that is higher than 180/120. °· Faint. °· Have thoughts about hurting yourself. °These symptoms may represent a serious problem that is an emergency. Do not wait to see if the symptoms will go away. Get medical help right away. Call your local emergency services (911 in the [place] Do  not drive yourself to the hospital.  °Summary °· Acute coronary syndrome (ACS) is when there is not enough blood and oxygen being supplied to the heart. ACS can result in chest pain or a heart attack. °· Acute coronary syndrome is a medical emergency. If you have any symptoms of this condition, get help right away. °· Treatment includes medicines and procedures to open the blocked arteries and restore blood flow. °This information is not intended to replace advice given to you by your health care provider. Make sure you discuss any questions you have with your health care provider. °Document Released: 10/21/2005 Document Revised: 11/02/2018 Document Reviewed: 11/02/2018 °Elsevier Patient Education © 2020 [employer] ° °

## 2019-05-26 ENCOUNTER — Encounter: Payer: Self-pay | Admitting: *Deleted

## 2019-05-26 NOTE — Telephone Encounter (Signed)
Pt has been notified of procedure date, COVID 19 test has been completed and results are negative

## 2019-05-27 ENCOUNTER — Inpatient Hospital Stay: Payer: Medicare Other

## 2019-05-27 ENCOUNTER — Inpatient Hospital Stay
Admission: RE | Admit: 2019-05-27 | Discharge: 2019-06-02 | DRG: 356 | Disposition: A | Discharge status: Home / Self Care | Payer: Medicare Other | Attending: Internal Medicine | Admitting: Internal Medicine

## 2019-05-27 ENCOUNTER — Ambulatory Visit: Payer: Medicare Other | Admitting: Nurse Practitioner

## 2019-05-27 ENCOUNTER — Encounter: Payer: Self-pay | Admitting: Physician Assistant

## 2019-05-27 ENCOUNTER — Encounter: Payer: Self-pay | Admitting: Anesthesiology

## 2019-05-27 ENCOUNTER — Encounter
Admission: RE | Disposition: A | Discharge status: Home / Self Care | Payer: Self-pay | Source: Home / Self Care | Attending: Internal Medicine

## 2019-05-27 ENCOUNTER — Ambulatory Visit: Payer: Medicare Other | Admitting: Certified Registered"

## 2019-05-27 DIAGNOSIS — I708 Atherosclerosis of other arteries: Secondary | ICD-10-CM | POA: Diagnosis present

## 2019-05-27 DIAGNOSIS — Z7982 Long term (current) use of aspirin: Secondary | ICD-10-CM

## 2019-05-27 DIAGNOSIS — I255 Ischemic cardiomyopathy: Secondary | ICD-10-CM | POA: Diagnosis present

## 2019-05-27 DIAGNOSIS — K2960 Other gastritis without bleeding: Secondary | ICD-10-CM | POA: Diagnosis not present

## 2019-05-27 DIAGNOSIS — I5042 Chronic combined systolic (congestive) and diastolic (congestive) heart failure: Secondary | ICD-10-CM | POA: Diagnosis present

## 2019-05-27 DIAGNOSIS — D62 Acute posthemorrhagic anemia: Secondary | ICD-10-CM | POA: Diagnosis present

## 2019-05-27 DIAGNOSIS — K551 Chronic vascular disorders of intestine: Secondary | ICD-10-CM | POA: Diagnosis present

## 2019-05-27 DIAGNOSIS — I774 Celiac artery compression syndrome: Secondary | ICD-10-CM | POA: Diagnosis present

## 2019-05-27 DIAGNOSIS — K254 Chronic or unspecified gastric ulcer with hemorrhage: Principal | ICD-10-CM

## 2019-05-27 DIAGNOSIS — I70208 Unspecified atherosclerosis of native arteries of extremities, other extremity: Secondary | ICD-10-CM | POA: Diagnosis not present

## 2019-05-27 DIAGNOSIS — Z20828 Contact with and (suspected) exposure to other viral communicable diseases: Secondary | ICD-10-CM | POA: Diagnosis present

## 2019-05-27 DIAGNOSIS — D509 Iron deficiency anemia, unspecified: Secondary | ICD-10-CM | POA: Diagnosis not present

## 2019-05-27 DIAGNOSIS — K920 Hematemesis: Secondary | ICD-10-CM | POA: Diagnosis present

## 2019-05-27 DIAGNOSIS — I998 Other disorder of circulatory system: Secondary | ICD-10-CM | POA: Diagnosis not present

## 2019-05-27 DIAGNOSIS — R578 Other shock: Secondary | ICD-10-CM | POA: Diagnosis present

## 2019-05-27 DIAGNOSIS — I2510 Atherosclerotic heart disease of native coronary artery without angina pectoris: Secondary | ICD-10-CM | POA: Diagnosis present

## 2019-05-27 DIAGNOSIS — K279 Peptic ulcer, site unspecified, unspecified as acute or chronic, without hemorrhage or perforation: Secondary | ICD-10-CM

## 2019-05-27 DIAGNOSIS — I739 Peripheral vascular disease, unspecified: Secondary | ICD-10-CM | POA: Diagnosis not present

## 2019-05-27 DIAGNOSIS — Z7189 Other specified counseling: Secondary | ICD-10-CM | POA: Diagnosis not present

## 2019-05-27 DIAGNOSIS — K559 Vascular disorder of intestine, unspecified: Secondary | ICD-10-CM | POA: Diagnosis not present

## 2019-05-27 DIAGNOSIS — I447 Left bundle-branch block, unspecified: Secondary | ICD-10-CM | POA: Diagnosis present

## 2019-05-27 DIAGNOSIS — R571 Hypovolemic shock: Secondary | ICD-10-CM | POA: Diagnosis not present

## 2019-05-27 DIAGNOSIS — K5289 Other specified noninfective gastroenteritis and colitis: Secondary | ICD-10-CM

## 2019-05-27 DIAGNOSIS — Z87891 Personal history of nicotine dependence: Secondary | ICD-10-CM

## 2019-05-27 DIAGNOSIS — E1122 Type 2 diabetes mellitus with diabetic chronic kidney disease: Secondary | ICD-10-CM | POA: Diagnosis present

## 2019-05-27 DIAGNOSIS — K259 Gastric ulcer, unspecified as acute or chronic, without hemorrhage or perforation: Secondary | ICD-10-CM

## 2019-05-27 DIAGNOSIS — K274 Chronic or unspecified peptic ulcer, site unspecified, with hemorrhage: Secondary | ICD-10-CM | POA: Diagnosis not present

## 2019-05-27 DIAGNOSIS — E785 Hyperlipidemia, unspecified: Secondary | ICD-10-CM | POA: Diagnosis present

## 2019-05-27 DIAGNOSIS — N183 Chronic kidney disease, stage 3 (moderate): Secondary | ICD-10-CM | POA: Diagnosis present

## 2019-05-27 DIAGNOSIS — Z8249 Family history of ischemic heart disease and other diseases of the circulatory system: Secondary | ICD-10-CM

## 2019-05-27 DIAGNOSIS — I130 Hypertensive heart and chronic kidney disease with heart failure and stage 1 through stage 4 chronic kidney disease, or unspecified chronic kidney disease: Secondary | ICD-10-CM | POA: Diagnosis present

## 2019-05-27 DIAGNOSIS — N179 Acute kidney failure, unspecified: Secondary | ICD-10-CM | POA: Diagnosis not present

## 2019-05-27 DIAGNOSIS — E1151 Type 2 diabetes mellitus with diabetic peripheral angiopathy without gangrene: Secondary | ICD-10-CM | POA: Diagnosis present

## 2019-05-27 DIAGNOSIS — I6523 Occlusion and stenosis of bilateral carotid arteries: Secondary | ICD-10-CM | POA: Diagnosis not present

## 2019-05-27 DIAGNOSIS — I700 Atherosclerosis of aorta: Secondary | ICD-10-CM | POA: Diagnosis not present

## 2019-05-27 DIAGNOSIS — K55069 Acute infarction of intestine, part and extent unspecified: Secondary | ICD-10-CM | POA: Diagnosis not present

## 2019-05-27 DIAGNOSIS — Z9889 Other specified postprocedural states: Secondary | ICD-10-CM | POA: Diagnosis not present

## 2019-05-27 DIAGNOSIS — Z515 Encounter for palliative care: Secondary | ICD-10-CM | POA: Diagnosis not present

## 2019-05-27 HISTORY — PX: ESOPHAGOGASTRODUODENOSCOPY (EGD) WITH PROPOFOL: SHX5813

## 2019-05-27 LAB — COMPREHENSIVE METABOLIC PANEL
ALT: 61 U/L — ABNORMAL HIGH (ref 0–44)
AST: 68 U/L — ABNORMAL HIGH (ref 15–41)
Albumin: 2.8 g/dL — ABNORMAL LOW (ref 3.5–5.0)
Alkaline Phosphatase: 96 U/L (ref 38–126)
Anion gap: 7 (ref 5–15)
BUN: 24 mg/dL — ABNORMAL HIGH (ref 8–23)
CO2: 24 mmol/L (ref 22–32)
Calcium: 8.5 mg/dL — ABNORMAL LOW (ref 8.9–10.3)
Chloride: 106 mmol/L (ref 98–111)
Creatinine, Ser: 1.38 mg/dL — ABNORMAL HIGH (ref 0.61–1.24)
GFR calc Af Amer: 58 mL/min — ABNORMAL LOW (ref >60)
GFR calc non Af Amer: 50 mL/min — ABNORMAL LOW (ref >60)
Glucose, Bld: 131 mg/dL — ABNORMAL HIGH (ref 70–99)
Potassium: 4.4 mmol/L (ref 3.5–5.1)
Sodium: 137 mmol/L (ref 135–145)
Total Bilirubin: 0.5 mg/dL (ref 0.3–1.2)
Total Protein: 7.1 g/dL (ref 6.5–8.1)

## 2019-05-27 LAB — CBC
HCT: 38.4 % — ABNORMAL LOW (ref 39.0–52.0)
HCT: 33.1 % — ABNORMAL LOW (ref 39.0–52.0)
Hemoglobin: 11.7 g/dL — ABNORMAL LOW (ref 13.0–17.0)
Hemoglobin: 10.3 g/dL — ABNORMAL LOW (ref 13.0–17.0)
MCH: 26.7 pg (ref 26.0–34.0)
MCH: 27 pg (ref 26.0–34.0)
MCHC: 30.5 g/dL (ref 30.0–36.0)
MCHC: 31.1 g/dL (ref 30.0–36.0)
MCV: 87.5 fL (ref 80.0–100.0)
MCV: 86.9 fL (ref 80.0–100.0)
Platelets: 237 10*3/uL (ref 150–400)
Platelets: 220 10*3/uL (ref 150–400)
RBC: 4.39 MIL/uL (ref 4.22–5.81)
RBC: 3.81 MIL/uL — ABNORMAL LOW (ref 4.22–5.81)
RDW: 24.9 % — ABNORMAL HIGH (ref 11.5–15.5)
RDW: 24.9 % — ABNORMAL HIGH (ref 11.5–15.5)
WBC: 13.2 10*3/uL — ABNORMAL HIGH (ref 4.0–10.5)
WBC: 7.3 10*3/uL (ref 4.0–10.5)
nRBC: 0 % (ref 0.0–0.2)
nRBC: 0 % (ref 0.0–0.2)

## 2019-05-27 LAB — TSH: TSH: 6.226 u[IU]/mL — ABNORMAL HIGH (ref 0.350–4.500)

## 2019-05-27 LAB — GLUCOSE, CAPILLARY
Glucose-Capillary: 98 mg/dL (ref 70–99)
Glucose-Capillary: 100 mg/dL — ABNORMAL HIGH (ref 70–99)
Glucose-Capillary: 82 mg/dL (ref 70–99)
Glucose-Capillary: 81 mg/dL (ref 70–99)

## 2019-05-27 LAB — FERRITIN: Ferritin: 454 ng/mL — ABNORMAL HIGH (ref 24–336)

## 2019-05-27 LAB — IRON AND TIBC
Iron: 40 ug/dL — ABNORMAL LOW (ref 45–182)
Saturation Ratios: 13 % — ABNORMAL LOW (ref 17.9–39.5)
TIBC: 311 ug/dL (ref 250–450)
UIBC: 271 ug/dL

## 2019-05-27 LAB — MAGNESIUM: Magnesium: 2.5 mg/dL — ABNORMAL HIGH (ref 1.7–2.4)

## 2019-05-27 LAB — MRSA PCR SCREENING: MRSA by PCR: NEGATIVE

## 2019-05-27 LAB — HEMOGLOBIN AND HEMATOCRIT, BLOOD
HCT: 33.3 % — ABNORMAL LOW (ref 39.0–52.0)
Hemoglobin: 10.1 g/dL — ABNORMAL LOW (ref 13.0–17.0)

## 2019-05-27 SURGERY — ESOPHAGOGASTRODUODENOSCOPY (EGD) WITH PROPOFOL
Anesthesia: General

## 2019-05-27 MED ORDER — DOPAMINE-DEXTROSE 3.2-5 MG/ML-% IV SOLN
2.5000 ug/kg/min | INTRAVENOUS | Status: DC
  Administered 2019-05-27: 2.5 ug/kg/min via INTRAVENOUS
  Filled 2019-05-27: qty 250

## 2019-05-27 MED ORDER — FERROUS SULFATE 325 (65 FE) MG PO TBEC: 325.0000 mg | DELAYED_RELEASE_TABLET | Freq: Two times a day (BID) | ORAL | Status: DC

## 2019-05-27 MED ORDER — SODIUM CHLORIDE (PF) 0.9 % IJ SOLN
PREFILLED_SYRINGE | INTRAMUSCULAR | Status: DC | PRN
  Administered 2019-05-27: 09:00:00 5 mL

## 2019-05-27 MED ORDER — VANCOMYCIN HCL 1.25 G IV SOLR
1250.0000 mg | INTRAVENOUS | Status: DC
  Filled 2019-05-27: qty 1250

## 2019-05-27 MED ORDER — LIDOCAINE HCL (PF) 2 % IJ SOLN
INTRAMUSCULAR | Status: AC
  Filled 2019-05-27: qty 10

## 2019-05-27 MED ORDER — FUROSEMIDE 20 MG PO TABS: 10.0000 mg | ORAL_TABLET | Freq: Every day | ORAL | Status: DC

## 2019-05-27 MED ORDER — DEXMEDETOMIDINE HCL IN NACL 200 MCG/50ML IV SOLN
INTRAVENOUS | Status: DC | PRN
  Administered 2019-05-27 (×2): 4 ug via INTRAVENOUS

## 2019-05-27 MED ORDER — OSMOLITE 1.5 CAL PO LIQD
1000.0000 mL | ORAL | Status: DC
  Administered 2019-05-27: 1000 mL

## 2019-05-27 MED ORDER — SODIUM CHLORIDE (PF) 0.9 % IJ SOLN
10.0000 mL | INTRAMUSCULAR | Status: DC | PRN
  Administered 2019-05-27: 10 mL

## 2019-05-27 MED ORDER — PIPERACILLIN-TAZOBACTAM 3.375 G IVPB
3.3750 g | Freq: Three times a day (TID) | INTRAVENOUS | Status: DC
  Administered 2019-05-27 – 2019-06-01 (×14): 3.375 g via INTRAVENOUS
  Filled 2019-05-27 (×13): qty 50

## 2019-05-27 MED ORDER — SODIUM CHLORIDE 0.9 % IV SOLN
80.0000 mg | Freq: Once | INTRAVENOUS | Status: AC
  Administered 2019-05-27: 80 mg via INTRAVENOUS
  Filled 2019-05-27: qty 80

## 2019-05-27 MED ORDER — GLYCOPYRROLATE 0.2 MG/ML IJ SOLN
INTRAMUSCULAR | Status: DC | PRN
  Administered 2019-05-27: 0.1 mg via INTRAVENOUS

## 2019-05-27 MED ORDER — IOPAMIDOL (ISOVUE-370) INJECTION 76%
100.0000 mL | Freq: Once | INTRAVENOUS | Status: AC | PRN
  Administered 2019-05-27: 100 mL via INTRAVENOUS

## 2019-05-27 MED ORDER — IOHEXOL 350 MG/ML SOLN: 100.0000 mL | Freq: Once | INTRAVENOUS | Status: DC | PRN

## 2019-05-27 MED ORDER — CHLORHEXIDINE GLUCONATE CLOTH 2 % EX PADS
6.0000 | MEDICATED_PAD | Freq: Every day | CUTANEOUS | Status: DC
  Administered 2019-05-27 – 2019-06-01 (×5): 6 via TOPICAL

## 2019-05-27 MED ORDER — LIDOCAINE HCL (CARDIAC) PF 100 MG/5ML IV SOSY
PREFILLED_SYRINGE | INTRAVENOUS | Status: DC | PRN
  Administered 2019-05-27: 20 mg via INTRAVENOUS

## 2019-05-27 MED ORDER — PROPOFOL 10 MG/ML IV BOLUS
INTRAVENOUS | Status: DC | PRN
  Administered 2019-05-27: 30 mg via INTRAVENOUS

## 2019-05-27 MED ORDER — MELATONIN 5 MG PO TABS
10.0000 mg | ORAL_TABLET | Freq: Every day | ORAL | Status: DC
  Administered 2019-05-27 – 2019-06-01 (×3): 10 mg via ORAL
  Filled 2019-05-27 (×9): qty 2

## 2019-05-27 MED ORDER — IOHEXOL 300 MG/ML  SOLN
20.0000 mL | Freq: Once | INTRAMUSCULAR | Status: AC | PRN
  Administered 2019-05-27: 20 mL

## 2019-05-27 MED ORDER — PRO-STAT SUGAR FREE PO LIQD: 30.0000 mL | Freq: Every day | ORAL | Status: DC

## 2019-05-27 MED ORDER — VASOPRESSIN 20 UNIT/ML IV SOLN
INTRAVENOUS | Status: DC | PRN
  Administered 2019-05-27: 1 [IU] via INTRAVENOUS

## 2019-05-27 MED ORDER — GLYCOPYRROLATE 0.2 MG/ML IJ SOLN
INTRAMUSCULAR | Status: AC
  Filled 2019-05-27: qty 1

## 2019-05-27 MED ORDER — LACTATED RINGERS IV BOLUS
500.0000 mL | Freq: Once | INTRAVENOUS | Status: AC
  Administered 2019-05-27: 500 mL via INTRAVENOUS

## 2019-05-27 MED ORDER — PROPOFOL 500 MG/50ML IV EMUL
INTRAVENOUS | Status: DC | PRN
  Administered 2019-05-27: 75 ug/kg/min via INTRAVENOUS

## 2019-05-27 MED ORDER — SODIUM CHLORIDE 0.9 % IV SOLN
INTRAVENOUS | Status: DC
  Administered 2019-05-27: 1000 mL via INTRAVENOUS

## 2019-05-27 MED ORDER — VANCOMYCIN HCL 10 G IV SOLR
1750.0000 mg | Freq: Once | INTRAVENOUS | Status: AC
  Administered 2019-05-27: 1750 mg via INTRAVENOUS
  Filled 2019-05-27: qty 1750

## 2019-05-27 MED ORDER — DEXMEDETOMIDINE HCL IN NACL 80 MCG/20ML IV SOLN
INTRAVENOUS | Status: AC
  Filled 2019-05-27: qty 20

## 2019-05-27 MED ORDER — VASOPRESSIN 20 UNIT/ML IV SOLN
INTRAVENOUS | Status: AC
  Filled 2019-05-27: qty 1

## 2019-05-27 MED ORDER — SODIUM CHLORIDE 0.9 % IV BOLUS
500.0000 mL | Freq: Once | INTRAVENOUS | Status: AC
  Administered 2019-05-27: 20:00:00 500 mL via INTRAVENOUS

## 2019-05-27 MED ORDER — ALBUMIN HUMAN 5 % IV SOLN
25.0000 g | Freq: Once | INTRAVENOUS | Status: AC
  Administered 2019-05-27: 25 g via INTRAVENOUS
  Filled 2019-05-27: qty 500

## 2019-05-27 MED ORDER — PROPOFOL 10 MG/ML IV BOLUS
INTRAVENOUS | Status: AC
  Filled 2019-05-27: qty 40

## 2019-05-27 MED ORDER — SODIUM CHLORIDE 0.9 % IV SOLN
8.0000 mg/h | INTRAVENOUS | Status: AC
  Administered 2019-05-27 – 2019-05-30 (×7): 8 mg/h via INTRAVENOUS
  Filled 2019-05-27 (×8): qty 80

## 2019-05-27 MED ORDER — ATORVASTATIN CALCIUM 80 MG PO TABS
80.0000 mg | ORAL_TABLET | Freq: Every day | ORAL | Status: DC
  Administered 2019-05-27 – 2019-06-01 (×3): 80 mg via ORAL
  Filled 2019-05-27 (×2): qty 1
  Filled 2019-05-27 (×2): qty 4

## 2019-05-27 MED ORDER — PANTOPRAZOLE SODIUM 40 MG IV SOLR
40.0000 mg | Freq: Two times a day (BID) | INTRAVENOUS | Status: DC
  Administered 2019-05-30 – 2019-06-01 (×4): 40 mg via INTRAVENOUS
  Filled 2019-05-27 (×4): qty 40

## 2019-05-27 MED ORDER — LABETALOL HCL 5 MG/ML IV SOLN: 10.0000 mg | INTRAVENOUS | Status: DC | PRN

## 2019-05-27 MED ORDER — VANCOMYCIN HCL IN DEXTROSE 1-5 GM/200ML-% IV SOLN
1000.0000 mg | INTRAVENOUS | Status: DC
  Administered 2019-05-28 – 2019-05-29 (×2): 1000 mg via INTRAVENOUS
  Filled 2019-05-27 (×4): qty 200

## 2019-05-27 MED ORDER — FREE WATER
75.0000 mL | Status: DC
  Administered 2019-05-27 – 2019-05-30 (×14): 75 mL

## 2019-05-27 NOTE — Anesthesia Post-op Follow-up Note (Signed)
Anesthesia QCDR form completed.        

## 2019-05-27 NOTE — Progress Notes (Addendum)
Initial Nutrition Assessment  DOCUMENTATION CODES:   Not applicable  INTERVENTION:   Once post-pyloric feeding tube placed:  Osmolite 1.5 @ goal rate of 54ml/hr- Recommend initiate at 32ml/hr and increase by 33ml/hr q 8 hours until goal rate is reached.  Prostat liquid protein 68ml daily via tube, each supplement provides 100 kcal, 15 grams protein.  Recommend free water flushes 82ml q4 hours   Regimen at goal provides 1900kcal/day, 90g/day protein, 1338ml/day free water   Pt at high refeed risk; monitor K, Mg and P labs daily until stable.   NUTRITION DIAGNOSIS:   Inadequate oral intake related to acute illness(ischemic gastritis) as evidenced by NPO status.  GOAL:   Patient will meet greater than or equal to 90% of their needs  MONITOR:   Labs, Weight trends, TF tolerance, Skin, I & O's  REASON FOR ASSESSMENT:   Consult Enteral/tube feeding initiation and management  ASSESSMENT:   78 y.o. male with recent abdominal pain, nausea, emesis who underwent EGD 7/23 which is concerning for possible ischemic gastritis, complicated by pertinent comorbidities including a multitude of significant comorbidities including PUD, NSTEMI, CHF, DM, debilitation, and advanced age.   Unable to see patient today as pt not in room at time of RD visit. Per chart review, pt with poor appetite and oral intake for two weeks pta r/t nausea, vomiting and abdominal pain. Pt has primarily been drinking Ensure supplements per wife report. Per chart, pt has lost 20lbs(13%) in less than 2 months; this is significant. Pt found to have ischemic gastritis with necrotic gastric ulcers. Pt was seen by surgery and is a poor surgical candidate per PA note. Plan is for post pyloric feeding tube to be placed in radiology and initiation of tube feeds. Pt is at high refeed risk; recommend monitor electrolytes daily.   Pt at high risk for malnutrition but unable to diagnose at this time as NFPE cannot be performed.     Medications reviewed and include: lasix, melatonin, protonix, zosyn, vancomycin   Labs reviewed: BUN 24(H), creat 1.38(H), Mg 2.5(H) Iron 40(L), TIBC 311, ferritin 454(H) B12- 1044(H)- 7/12 Wbc- 13.2(H)  Unable to complete Nutrition-Focused physical exam at this time.   Diet Order:   Diet Order            Diet NPO time specified  Diet effective now             EDUCATION NEEDS:   Not appropriate for education at this time  Skin:  Skin Assessment: Reviewed RN Assessment  Last BM:  pta  Height:   Ht Readings from Last 1 Encounters:  05/27/19 5\' 9"  (1.753 m)    Weight:   Wt Readings from Last 1 Encounters:  05/27/19 62 kg    Ideal Body Weight:  72.7 kg  BMI:  Body mass index is 20.19 kg/m.  Estimated Nutritional Needs:   Kcal:  1700-2000kcal/day  Protein:  85-100g/day  Fluid:  >1.6L/day  Koleen Distance MS, RD, LDN Pager #- [phone] Office#- [phone] After Hours Pager: [phone]

## 2019-05-27 NOTE — Consult Note (Addendum)
Name: Kevin Case MRN: [id number] DOB: 1940/11/06    ADMISSION DATE:  05/27/2019 CONSULTATION DATE: 05/27/2019  REFERRING MD : Webb Silversmith, NP  CHIEF COMPLAINT: Hypotension   BRIEF PATIENT DESCRIPTION:  78 yo male admitted with septic shock secondary severe ischemic gastroenteritis resulting in necrotic gastric body and acute blood loss anemia secondary to requiring levophed gtt   SIGNIFICANT EVENTS/STUDIES:  07/23-EGD revealed normal duodenal bulb and second portion of the duodenum, oozing gastric ulcers with oozing hemorrhage (Forrest Class Ib). Injected, ischemic gastritis resulting in necrotic ulcer in gastric body, normal gastroesophageal junction and esophagus. 07/23-Pt admitted to the telemetry unit post EGD requiring transfer to ICU with hypotension requiring dopamine gtt  07/23-CTA Angio Extensive predominantly calcified aortic and peripheral vascular disease affecting numerous vascular beds. Aortic Atherosclerosis (ICD10-170.0) (ICD10-170.0). 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 The hypertrophic inferior mesenteric artery provides collateral flow to the superior mesenteric artery. Extensive peripheral arterial disease with heavily calcified atherosclerotic plaque throughout the iliac arteries with areas of mild to moderate stenosis bilaterally. Incompletely imaged chronic total occlusion of the right superficial femoral artery. No focal bowel wall thickening or evidence of infection/ inflammation at this time. Nonspecific tree-in-bud micro nodularity in the dependent portions of the visualized lower lungs may represent chronic or subclinical aspiration. Prostatomegaly.  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  He presented to [facility] on 07/23 for a scheduled EGD.  He was recently admitted to [facility] for a suspected upper GI Bleed and was diagnosed with pneumonia requiring iv antibiotics and discharged on augmentin and protonix bid with instructions to follow-up with GI in the outpatient setting for an EGD once pneumonia resolved.  Per GI notes pt has had poor po intake associated with nausea and vomiting. On 07/23 EGD revealed oozing gastric ulcers with oozing hemorrhage requiring epinephrine injection due to severe ischemic gastritis resulting in necrotic gastric body requiring hospital admission to the telemetry unit for additional workup and treatment.  Per surgery no indication for emergent surgical intervention and deemed poor surgical candidate recommended medical optimization.  He required transfer to the stepdown unit due to hypotension requiring dopamine gtt. CTA revealed high-grade celiac artery stenosis, therefore vascular surgery consulted.    PAST MEDICAL HISTORY :   has a past medical history of Anemia, Blood transfusion without reported diagnosis, CAD (coronary artery disease), Carotid arterial disease (HCC), CHF (congestive heart failure) (HCC), Clotting disorder (HCC), Diabetes mellitus without complication (HCC), GIB (gastrointestinal bleeding), Heart murmur, HFrEF (heart failure with reduced ejection fraction) (HCC), History of tobacco abuse, Hypertension, Ischemic cardiomyopathy, Left bundle branch block, Moderate aortic stenosis, PAD (peripheral artery disease) (HCC), Rib fracture, Sinus bradycardia, and Subclavian arterial stenosis (HCC).  has a past surgical history that includes RIGHT/LEFT HEART CATH AND CORONARY ANGIOGRAPHY (N/A, 03/15/2019); Cardiac catheterization; AORTIC ARCH  ANGIOGRAPHY (N/A, 03/31/2019); and PERIPHERAL VASCULAR INTERVENTION (Left, 03/31/2019). Prior to Admission medications   Medication Sig Start Date End Date Taking? Authorizing Provider  acetaminophen (TYLENOL) 500 MG tablet Take 1,000 mg by mouth every 6 (six) hours as needed (headache.).   Yes [provider]  aspirin 81 MG  chewable tablet Chew 1 tablet (81 mg total) by mouth daily. 03/17/19  Yes Salary, Evelena AsaMontell D, MD  atorvastatin (LIPITOR) 80 MG tablet Take 80 mg by mouth at bedtime.  01/05/19  Yes [provider]  cyanocobalamin 100 MCG tablet Take 100 mcg by mouth daily.   Yes [provider]  docusate sodium (COLACE) 100 MG capsule Take 100 mg by mouth daily.    Yes [provider]  ferrous sulfate 325 (65 FE) MG EC tablet Take 1 tablet (325 mg total) by mouth 2 (two) times daily. 05/19/19 05/18/20 Yes Sudini, Wardell HeathSrikar, MD  furosemide (LASIX) 20 MG tablet Take 0.5 tablets (10 mg total) by mouth daily. 04/12/19 07/11/19 Yes Creig HinesBerge, Christopher Ronald, NP  JARDIANCE 10 MG TABS tablet Take 10 mg by mouth daily. 05/14/19 08/12/19 Yes Pollak, Lavella HammockAdriana M, PA-C  losartan (COZAAR) 25 MG tablet Take 0.5 tablets (12.5 mg total) by mouth daily. 04/09/19 07/08/19 Yes Creig HinesBerge, Christopher Ronald, NP  Melatonin 10 MG TABS Take 10 mg by mouth at bedtime.    Yes [provider]  Multiple Vitamin (MULTIVITAMIN WITH MINERALS) TABS tablet Take 1 tablet by mouth daily. Multivitamin for Senior 50+   Yes [provider]  Omega-3 Fatty Acids (FISH OIL) 1000 MG CAPS Take 1,000 mg by mouth daily.   Yes [provider]  omeprazole (PRILOSEC) 40 MG capsule Take 1 capsule (40 mg total) by mouth 2 (two) times a day. 05/19/19 06/18/19 Yes Sudini, Wardell HeathSrikar, MD  traMADol (ULTRAM) 50 MG tablet Take 1 tablet (50 mg total) by mouth every 6 (six) hours as needed. Patient taking differently: Take 50 mg by mouth every 6 (six) hours as needed (pain).  03/16/19  Yes Salary, Evelena AsaMontell D, MD   No  Known Allergies  FAMILY HISTORY:  family history includes Congestive Heart Failure in his father; Dementia in his father and mother. SOCIAL HISTORY:  reports that he has quit smoking. His smoking use included cigarettes. He has a 85.50 pack-year smoking history. He has never used smokeless tobacco. He reports that he does not drink alcohol or use drugs.  REVIEW OF SYSTEMS: Positives in BOLD   Constitutional: Negative for fever, chills, weight loss, malaise/fatigue and diaphoresis.  HENT: Negative for hearing loss, ear pain, nosebleeds, congestion, sore throat, neck pain, tinnitus and ear discharge.   Eyes: Negative for blurred vision, double vision, photophobia, pain, discharge and redness.  Respiratory: Negative for cough, hemoptysis, sputum production, shortness of breath, wheezing and stridor.   Cardiovascular: Negative for chest pain, palpitations, orthopnea, claudication, leg swelling and PND.  Gastrointestinal: Negative for heartburn, nausea, vomiting, abdominal pain, diarrhea, constipation, blood in stool and melena.  Genitourinary: Negative for dysuria, urgency, frequency, hematuria and flank pain.  Musculoskeletal: Negative for myalgias, back pain, joint pain and falls.  Skin: Negative for itching and rash.  Neurological: Negative for dizziness, tingling, tremors, sensory change, speech change, focal weakness, seizures, loss of consciousness, weakness and headaches.  Endo/Heme/Allergies: Negative for environmental allergies and polydipsia. Does not bruise/bleed easily.  SUBJECTIVE:  No complaints at this time   VITAL SIGNS: Temp:  [96.2 F (35.7 C)-98.6 F (37 C)] 98.4 F (36.9 C) (07/23 2100) Pulse Rate:  [43-71] 48 (07/23 2300) Resp:  [15-24] 21 (07/23 2300) BP: (65-176)/(40-58) 71/43 (07/23 2300) SpO2:  [93 %-100 %] 98 % (07/23 2300) Weight:  [62 kg-69.4 kg] 63.1 kg (07/23 2100)  PHYSICAL EXAMINATION: General: frail elderly male resting in bed, NAD Neuro: alert  disoriented to time, follows commands  HEENT:  supple, no JVD  Cardiovascular: junctional rhythm, no R/G Lungs: clear throughout, even, non labored  Abdomen: +BS x4, soft, non tender, non distended  Musculoskeletal: normal bulk and tone, no edema  Skin: intact no rashes or lesions present   Recent Labs  Lab 05/27/19 1215  NA 137  K 4.4  CL 106  CO2 24  BUN 24*  CREATININE 1.38*  GLUCOSE 131*   Recent Labs  Lab 05/27/19 1007 05/27/19 1958 05/27/19 2229  HGB 11.7* 10.3* 10.1*  HCT 38.4* 33.1* 33.3*  WBC 13.2* 7.3  --   PLT 237 220  --    Dg Naso G Tube Plc W/fl W/rad  Result Date: 05/27/2019 CLINICAL DATA:  The off tube placement EXAM: NASO G TUBE PLACEMENT WITH FL AND WITH RAD CONTRAST:  20 mL of Omnipaque 300 FLUOROSCOPY TIME:  Fluoroscopy Time:  4 minutes and 18 seconds Number of Acquired Spot Images: 0 COMPARISON:  None. FINDINGS: A Dobbhoff tube was placed through the left nostril and advanced through the stomach into the distal duodenum. The distal tip is near the ligament of Treitz. IMPRESSION: Appropriate Dobbhoff tube placement as above. Electronically Signed   By: Dorise Bullion III M.D   On: 05/27/2019 14:57   Ct Angio Abd/pel W/ And/or W/o  Result Date: 05/27/2019 CLINICAL DATA:  78 year old male with infectious gastroenteritis/colitis. Evaluate for mesenteric ischemia. EXAM: CT ANGIOGRAPHY ABDOMEN AND PELVIS WITH CONTRAST AND WITHOUT CONTRAST TECHNIQUE: Multidetector CT imaging of the abdomen and pelvis was performed using the standard protocol during bolus administration of intravenous contrast. Multiplanar reconstructed images and MIPs were obtained and reviewed to evaluate the vascular anatomy. CONTRAST:  123mL ISOVUE-370 IOPAMIDOL (ISOVUE-370) INJECTION 76% COMPARISON:  None. FINDINGS: VASCULAR Aorta: Extensive calcified atherosclerotic plaque throughout the abdominal aorta. No evidence of aneurysm or dissection. There is bulky Coral reef like plaque in the visceral  aorta resulting in approximately 40% luminal loss. Celiac: Heavily calcified atherosclerotic plaque results in high-grade stenosis at the origin of the celiac artery. SMA: Heavily calcified atherosclerotic plaque with an additional fibrofatty component results in high-grade stenosis and probable occlusion of the proximal superior mesenteric artery. Renals: Heavy calcified atherosclerotic plaque along both renal arteries without evidence of significant stenosis. No changes of fibromuscular dysplasia. IMA: Hypertrophic inferior mesenteric artery provides the dominant blood supply to the superior mesenteric artery. Inflow: Bulky calcified atherosclerotic plaque bilaterally. Likely multifocal moderate to advanced stenosis throughout the iliac systems. Proximal Outflow: Bulky Coral reef like atherosclerotic plaque in the right common femoral artery results in a greater than 60% diameter stenosis. Fairly minimal plaque in the left common femoral artery. Heavily diseased superficial femoral arteries bilaterally with chronic occlusion of the right SFA. Veins: No focal venous abnormality or evidence of thrombus. Review of the MIP images confirms the above findings. NON-VASCULAR Lower chest: Dependent atelectasis in the lower lobes. Patchy areas of tree-in-bud micro nodularity in a peribronchovascular distribution in the dependent portions of the lower lungs. The heart is normal in size. Unremarkable distal thoracic esophagus. Hepatobiliary: Normal hepatic contour and morphology. No discrete hepatic lesions. Normal appearance of the gallbladder. No intra or extrahepatic biliary ductal dilatation. Pancreas: Unremarkable. No pancreatic ductal dilatation or surrounding inflammatory changes. Spleen: Normal in size without focal abnormality. Adrenals/Urinary Tract: Adrenal glands are unremarkable. Kidneys are normal, without renal calculi, focal lesion, or hydronephrosis. Bladder is unremarkable. Stomach/Bowel: No focal bowel wall  thickening or evidence of obstruction. Lymphatic: No suspicious lymphadenopathy. Reproductive: Prostatomegaly. Other: No abdominal wall hernia or abnormality. No abdominopelvic  ascites. Musculoskeletal: No acute fracture or aggressive appearing lytic or blastic osseous lesion. IMPRESSION: VASCULAR 1. 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 Aortic Atherosclerosis (ICD10-170.0) 2. 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 3. 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 4. The hypertrophic inferior mesenteric artery provides collateral flow to the superior mesenteric artery. 5. Extensive peripheral arterial disease with heavily calcified atherosclerotic plaque throughout the iliac arteries with areas of mild to moderate stenosis bilaterally. 6. Incompletely imaged chronic total occlusion of the right superficial femoral artery. NON-VASCULAR 1. No focal bowel wall thickening or evidence of infection/inflammation at this time. 2. Nonspecific tree-in-bud micro nodularity in the dependent portions of the visualized lower lungs may represent chronic or subclinical aspiration. 3. Prostatomegaly. Signed, Sterling BigHeath K. McCullough, MD, RPVI [facility] Electronically Signed   By: Malachy MoanHeath  McCullough M.D.   On: 05/27/2019 14:52    ASSESSMENT / PLAN:  Septic shock secondary severe ischemic gastroenteritis resulting in necrotic gastric body  Continuous telemetry monitoring  Prn dopamine to maintain map 60 Hold outpatient antihypertensives Trend WBC and monitor fever curve  Trend PCT and lactic acid  Continue zosyn and vancomycin  General surgery and GI consulted appreciate input   Keep NPO for now per GI recommendations   High-grade celiac artery stenosis-CT Abd  Pelvis results 05/28/2019 Vascular surgery consulted appreciate input   Acute blood loss anemia secondary to oozing ulcers s/p EGD treated with epinephrine injection 07/23 VTE px: SCD's avoid chemical prophylaxis  Serial H&H  Monitor for s/sx of bleeding and transfuse for hgb <7 Protonix gtt   Sonda Rumbleana Green [place], AGNP  Pulmonary/Critical Care Pager [phone] (please enter 7 digits) PCCM Consult Pager [phone] (please enter 7 digits)

## 2019-05-27 NOTE — Progress Notes (Addendum)
Pharmacy Antibiotic Note  Kevin Case is a 78 y.o. male admitted on 05/27/2019 with ischemic gastroenteritis.  Pharmacy has been consulted for Vancomycin and Zosyn dosing.  Plan: Vancomycin 1750mg  IV loading dose followed by Vancomycin 1000 mg IV Q 24 hrs. Goal AUC 400-550. Expected AUC: 523.5 SCr used: 1.38 Expected Cmin: 13.0  Zosyn 3.375g IV q8h extended infusion   Height: 5\' 9"  (175.3 cm) Weight: 153 lb (69.4 kg) IBW/kg (Calculated) : 70.7  Temp (24hrs), Avg:96.7 F (35.9 C), Min:96.2 F (35.7 C), Max:97.1 F (36.2 C)  Recent Labs  Lab 05/27/19 1007  WBC 13.2*    Estimated Creatinine Clearance: 54.5 mL/min (by C-G formula based on SCr of 1.15 mg/dL).    No Known Allergies  Antimicrobials this admission: Vancomycin 7/23 >>  Zosyn 7/23 >>   Thank you for allowing pharmacy to be a part of this patient's care.  Paulina Fusi, PharmD, BCPS 05/27/2019 11:16 AM

## 2019-05-27 NOTE — Progress Notes (Signed)
Pt arrived on the unit from Endo at 1150. Wife at bedside. Pt was A&Ox4. VSS. Pt and wife oriented to room and bedside equipment. Pt asked not to get OOB without assistance. Pt verbalized understanding. Fall risk bracelet applied. Pts skin is intact excepted for a scabbed over abrasion on his left FA and some redness to the right heel. Orders reviewed, acknowledged, and initiated. Care plan and education initiated. Pt arrived with clothes and cane. Pt wears dentures but left them at home.

## 2019-05-27 NOTE — Progress Notes (Signed)
Being transferred to ICU for hemorrhagic shock, needs aggressive volume resuscitation  will be on Protonix drip.

## 2019-05-27 NOTE — Op Note (Signed)
[facility] Gastroenterology Patient Name: Kevin Case Procedure Date: 05/27/2019 9:12 AM MRN: [id number] Account #: [id number] Date of Birth: October 12, 1941 Admit Type: Outpatient Age: 78 Room: [facility] ENDO ROOM 2 Gender: Male Note Status: Finalized Procedure:            Upper GI endoscopy Indications:          Iron deficiency anemia due to suspected upper                        gastrointestinal bleeding, Hematemesis Providers:            Lin Landsman MD, MD Referring MD:         Wendee Beavers. Terrilee Croak (Referring MD) Medicines:            Monitored Anesthesia Care Complications:        No immediate complications. Estimated blood loss: None. Procedure:            Pre-Anesthesia Assessment:                       - Prior to the procedure, a History and Physical was                        performed, and patient medications and allergies were                        reviewed. The patient is competent. The risks and                        benefits of the procedure and the sedation options and                        risks were discussed with the patient. All questions                        were answered and informed consent was obtained.                        Patient identification and proposed procedure were                        verified by the physician, the nurse, the                        anesthesiologist, the anesthetist and the technician in                        the pre-procedure area in the procedure room in the                        endoscopy suite. Mental Status Examination: alert and                        oriented. Airway Examination: normal oropharyngeal                        airway and neck mobility. Respiratory Examination:                        clear to auscultation. CV Examination: normal.  Prophylactic Antibiotics: The patient does not require                        prophylactic antibiotics. Prior Anticoagulants: The                  patient has taken no previous anticoagulant or                        antiplatelet agents. ASA Grade Assessment: IV - A                        patient with severe systemic disease that is a constant                        threat to life. After reviewing the risks and benefits,                        the patient was deemed in satisfactory condition to                        undergo the procedure. The anesthesia plan was to use                        monitored anesthesia care (MAC). Immediately prior to                        administration of medications, the patient was                        re-assessed for adequacy to receive sedatives. The                        heart rate, respiratory rate, oxygen saturations, blood                        pressure, adequacy of pulmonary ventilation, and                        response to care were monitored throughout the                        procedure. The physical status of the patient was                        re-assessed after the procedure.                       After obtaining informed consent, the endoscope was                        passed under direct vision. Throughout the procedure,                        the patient's blood pressure, pulse, and oxygen                        saturations were monitored continuously. The Endoscope                        was introduced through the mouth, and  advanced to the                        second part of duodenum. The upper GI endoscopy was                        accomplished without difficulty. The patient tolerated                        the procedure well. Findings:      The duodenal bulb and second portion of the duodenum were normal.      Many oozing cratered gastric ulcers with oozing hemorrhage (Forrest       Class Ib) were found in the gastric body, at the incisura and in the       gastric antrum. The largest lesion was 15 mm in largest dimension. Area       was successfully  injected with 6 mL of a 1:10,000 solution of       epinephrine for hemostasis. A wide area of eschar with underlying large       ulcer is found in the body along greater curvature. The edges of ulcer       were oozing. Very friable mucosa with spontaneous oozing is found in       incisura, treated with epi. Entire gastric mucosa is friable and       congested. All these features are consistent with ischemic and necrotic       gastritis give his history of severe cardiomyopathy      The gastroesophageal junction and examined esophagus were normal. Impression:           - Normal duodenal bulb and second portion of the                        duodenum.                       - Oozing gastric ulcers with oozing hemorrhage (Forrest                        Class Ib). Injected.                       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                       - No specimens collected. Recommendation:       - Admit the patient to hospital ward.                       - Clear or full liquid diet only                       - Empiric antibiotics                       - Stop aspirin                       -  Recommend surgery consult                       - Inpatient GI will follow the patient                       - Give Protonix (pantoprazole): initiate therapy with                        80 mg IV bolus, then 8 mg/hr IV by continuous infusion.                       - Maintain MAP > 80                       - CT angio to evaluate for celiac artery stenosis Procedure Code(s):    --- Professional ---                       [phone], Esophagogastroduodenoscopy, flexible, transoral;                        with control of bleeding, any method Diagnosis Code(s):    --- Professional ---                       K25.4, Chronic or unspecified gastric ulcer with                        hemorrhage                        D50.9, Iron deficiency anemia, unspecified                       K92.0, Hematemesis CPT copyright 2019 American Medical Association. All rights reserved. The codes documented in this report are preliminary and upon coder review may  be revised to meet current compliance requirements. Dr. Ulyess Mort Lin Landsman MD, MD 05/27/2019 9:54:41 AM This report has been signed electronically. Number of Addenda: 0 Note Initiated On: 05/27/2019 9:12 AM Estimated Blood Loss: Estimated blood loss: none.      [facility]

## 2019-05-27 NOTE — Anesthesia Postprocedure Evaluation (Addendum)
Anesthesia Post Note  Patient: Kevin Case  Procedure(s) Performed: ESOPHAGOGASTRODUODENOSCOPY (EGD) WITH PROPOFOL (N/A )  Patient location during evaluation: Endoscopy Anesthesia Type: General Level of consciousness: awake and alert and oriented Pain management: pain level controlled Vital Signs Assessment: post-procedure vital signs reviewed and stable Respiratory status: spontaneous breathing Cardiovascular status: blood pressure returned to baseline Anesthetic complications: no Comments: Heart rate double counting when it was reading in the 100s secondary to LBBB.  Checked by pulse check by MD     Last Vitals:  Vitals:   05/27/19 1124 05/27/19 1158  BP: (!) 121/50 (!) 115/50  Pulse: 61 (!) 54  Resp: 20 16  Temp:  36.7 C  SpO2: 100% 99%    Last Pain:  Vitals:   05/27/19 1204  TempSrc:   PainSc: 0-No pain                 Benito Lemmerman

## 2019-05-27 NOTE — H&P (Addendum)
agree with detailed note below. Patient presentation and plan discussed on rounds with APP.    [facility] [place] [facility]   PATIENT NAME: Kevin Case    MR#:  [id number]  DATE OF BIRTH:  03-31-1941  DATE OF ADMISSION:  05/27/2019  PRIMARY CARE PHYSICIAN: Trey Sailors, PA-C   REQUESTING/REFERRING PHYSICIAN:   CHIEF COMPLAINT:  No chief complaint on file.   HISTORY OF PRESENT ILLNESS:  78 y.o. male with pertinent past medical history of CAD, ischemic cardiomyopathy, hypertension, tobacco abuse, HFR EF, diabetes mellitus without complication, PAD, subclavian stenosis, PUD, and GIB presenting as direct admit from endoscopy suite with ischemic gastritis status post EGD.  Patient was recently admitted with suspected upper GI bleed and found to have pneumonia which was treated with IV antibiotics.  He was discharged on Augmentin, Protonix twice daily and to follow-up with GI for outpatient EGD following pneumonia resolution. Per GI report since discharge he has had poor po intake associated with nausea and vomiting. He was scheduled for EGD as outpatient today.  Patient had successful upper GI endoscopy done today due to suspected upper GI bleed, hematemesis per Dr. Allegra Lai. Patient was found to have oozing gastric ulcers with oozing hemorrhage due to ischemic gastritis resulting in necrotic gastric body.  Hospitalist were contacted to admit patient to unit for further management.  PAST MEDICAL HISTORY:   Past Medical History:  Diagnosis Date  . Anemia   . Blood transfusion without reported diagnosis   . CAD (coronary artery disease)    a. 03/2019 Cath: LM nl, LAD 50p CA2+, 45m, D1 50ost, LCX 20p, 56m, RCA small, mild diff dzs-->Med rx. Consider PCI LCX for refractory angina.  . Carotid arterial disease (HCC)    a. 03/2019 Carotid U/S: RICA 1-39%, RECA <50%, LICA 1-39%, LECA <50%.  . CHF (congestive heart failure) (HCC)   . Clotting disorder (HCC)   .  Diabetes mellitus without complication (HCC)   . GIB (gastrointestinal bleeding)    a. 03/2019 BRBPR following PV procedure req anticoagulation-->2u PRBCs.  . Heart murmur   . HFrEF (heart failure with reduced ejection fraction) (HCC)    a. 03/2019 Echo: EF 30-35%, mild conc LVH. Mildly dil LA. Mod MV annular dil. Mod AS (mean grad , Valve area 0.76).  Marland Kitchen History of tobacco abuse   . Hypertension   . Ischemic cardiomyopathy    a. 03/2019 Echo: EF 30-35%.  . Left bundle branch block   . Moderate aortic stenosis    a. 03/2019 Echo:  Mod AS (mean grad , Valve area 0.76).  Marland Kitchen PAD (peripheral artery disease) (HCC)    a. 03/2019 PV Angio: RCFA 80, RSFA 100.  . Rib fracture   . Sinus bradycardia   . Subclavian arterial stenosis (HCC)    a. 03/2019 PV Angio: RSCA 90 after origin of CCA. RCCA 30ost, LSCA 80 (PTA and covered stenting).    PAST SURGICAL HISTORY:   Past Surgical History:  Procedure Laterality Date  . AORTIC ARCH ANGIOGRAPHY N/A 03/31/2019   Procedure: AORTIC ARCH ANGIOGRAPHY;  Surgeon: Iran Ouch, MD;  Location: [facility] INVASIVE CV LAB;  Service: Cardiovascular;  Laterality: N/A;  . CARDIAC CATHETERIZATION    . PERIPHERAL VASCULAR INTERVENTION Left 03/31/2019   Procedure: PERIPHERAL VASCULAR INTERVENTION;  Surgeon: Iran Ouch, MD;  Location: [facility] INVASIVE CV LAB;  Service: Cardiovascular;  Laterality: Left;  . RIGHT/LEFT HEART CATH AND CORONARY ANGIOGRAPHY N/A 03/15/2019   Procedure: RIGHT/LEFT HEART CATH AND  CORONARY ANGIOGRAPHY;  Surgeon: Iran OuchArida, Muhammad A, MD;  Location: [facility] INVASIVE CV LAB;  Service: Cardiovascular;  Laterality: N/A;    SOCIAL HISTORY:   Social History   Tobacco Use  . Smoking status: Former Smoker    Packs/day: 1.50    Years: 57.00    Pack years: 85.50    Types: Cigarettes  . Smokeless tobacco: Never Used  Substance Use Topics  . Alcohol use: No    Frequency: Never    FAMILY HISTORY:   Family History  Problem Relation Age of  Onset  . Congestive Heart Failure Father        had AICD placed.   . Dementia Father   . Dementia Mother     DRUG ALLERGIES:  No Known Allergies  REVIEW OF SYSTEMS:   Review of Systems  Constitutional: Positive for weight loss. Negative for chills, fever and malaise/fatigue.  HENT: Negative for congestion, hearing loss and sore throat.   Eyes: Negative for blurred vision and double vision.  Respiratory: Negative for cough, shortness of breath and wheezing.   Cardiovascular: Negative for chest pain, palpitations, orthopnea and leg swelling.  Gastrointestinal: Positive for abdominal pain, nausea and vomiting. Negative for diarrhea.  Genitourinary: Negative for dysuria and urgency.  Musculoskeletal: Negative for myalgias.  Skin: Negative for rash.  Neurological: Negative for dizziness, sensory change, speech change, focal weakness and headaches.  Psychiatric/Behavioral: Negative for depression.    MEDICATIONS AT HOME:   Prior to Admission medications   Medication Sig Start Date End Date Taking? Authorizing Provider  acetaminophen (TYLENOL) 500 MG tablet Take 1,000 mg by mouth every 6 (six) hours as needed (headache.).   Yes [provider]  aspirin 81 MG chewable tablet Chew 1 tablet (81 mg total) by mouth daily. 03/17/19  Yes Salary, Evelena AsaMontell D, MD  atorvastatin (LIPITOR) 80 MG tablet Take 80 mg by mouth at bedtime.  01/05/19  Yes [provider]  cyanocobalamin 100 MCG tablet Take 100 mcg by mouth daily.   Yes [provider]  docusate sodium (COLACE) 100 MG capsule Take 100 mg by mouth daily.    Yes [provider]  ferrous sulfate 325 (65 FE) MG EC tablet Take 1 tablet (325 mg total) by mouth 2 (two) times daily. 05/19/19 05/18/20 Yes Sudini, Wardell HeathSrikar, MD  furosemide (LASIX) 20 MG tablet Take 0.5 tablets (10 mg total) by mouth daily. 04/12/19 07/11/19 Yes Creig HinesBerge, Christopher Ronald, NP  JARDIANCE 10 MG TABS tablet Take 10 mg by mouth daily. 05/14/19 08/12/19  Yes Pollak, Lavella HammockAdriana M, PA-C  losartan (COZAAR) 25 MG tablet Take 0.5 tablets (12.5 mg total) by mouth daily. 04/09/19 07/08/19 Yes Creig HinesBerge, Christopher Ronald, NP  Melatonin 10 MG TABS Take 10 mg by mouth at bedtime.    Yes [provider]  Multiple Vitamin (MULTIVITAMIN WITH MINERALS) TABS tablet Take 1 tablet by mouth daily. Multivitamin for Senior 50+   Yes [provider]  Omega-3 Fatty Acids (FISH OIL) 1000 MG CAPS Take 1,000 mg by mouth daily.   Yes [provider]  omeprazole (PRILOSEC) 40 MG capsule Take 1 capsule (40 mg total) by mouth 2 (two) times a day. 05/19/19 06/18/19 Yes Sudini, Wardell HeathSrikar, MD  traMADol (ULTRAM) 50 MG tablet Take 1 tablet (50 mg total) by mouth every 6 (six) hours as needed. Patient taking differently: Take 50 mg by mouth every 6 (six) hours as needed (pain).  03/16/19  Yes Salary, Evelena AsaMontell D, MD      VITAL SIGNS:  Blood  pressure (!) 121/50, pulse 61, temperature (!) 97.1 F (36.2 C), temperature source Tympanic, resp. rate 20, height 5\' 9"  (1.753 m), weight 69.4 kg, SpO2 100 %.  PHYSICAL EXAMINATION:   Physical Exam  GENERAL:  78 y.o.-year-old patient lying in the bed with no acute distress. Appears ill and lethargic EYES: Pupils equal, round, reactive to light and accommodation. No scleral icterus. Extraocular muscles intact.  HEENT: Head atraumatic, normocephalic. Oropharynx and nasopharynx clear.  NECK:  Supple, no jugular venous distention. No thyroid enlargement, no tenderness.  LUNGS: Normal breath sounds bilaterally, no wheezing, rales,rhonchi or crepitation. No use of accessory muscles of respiration.  CARDIOVASCULAR: S1, S2 normal. No murmurs, rubs, or gallops.  ABDOMEN: Soft, nontender, nondistended. Bowel sounds present. No organomegaly or mass.  EXTREMITIES: No pedal edema, cyanosis, or clubbing. No rash or lesions. + pedal pulses MUSCULOSKELETAL: Normal bulk, and power was 5+ grip and elbow, knee, and ankle flexion and extension  bilaterally.  NEUROLOGIC:Alert and oriented x 3. CN 2-12 intact. Sensation to light touch and cold stimuli intact bilaterally. DTR's (biceps, patellar, and achilles) 2+ and symmetric throughout. Gait not tested due to safety concern. PSYCHIATRIC: The patient is alert and oriented x 3.  SKIN: No obvious rash, lesion, or ulcer.   DATA REVIEWED:  LABORATORY PANEL:   CBC Recent Labs  Lab 05/27/19 1007  WBC 13.2*  HGB 11.7*  HCT 38.4*  PLT 237   ------------------------------------------------------------------------------------------------------------------  Chemistries  No results for input(s): NA, K, CL, CO2, GLUCOSE, BUN, CREATININE, CALCIUM, MG, AST, ALT, ALKPHOS, BILITOT in the last 168 hours.  Invalid input(s): GFRCGP ------------------------------------------------------------------------------------------------------------------  Cardiac Enzymes No results for input(s): TROPONINI in the last 168 hours. ------------------------------------------------------------------------------------------------------------------  RADIOLOGY:  No results found.  EKG:  EKG: normal EKG, normal sinus rhythm, unchanged from previous tracings.  IMPRESSION AND PLAN:   78 y.o. male  pertinent past medical history of CAD, ischemic cardiomyopathy, hypertension, tobacco abuse, HFR EF, diabetes mellitus without complication, PAD, subclavian stenosis, PUD, and GIB presenting as direct admit from endoscopy suite with ischemic gastritis status post EGD.   GI Bleed-patient presenting with hematemesis - s/p EGD 05/27/2019 found to have severe ischemic gastritis - Admit to telemetry unit - IVF resuscitation to maintain MAP>65 - H&H monitoring q6h -Transfuse PRN Hgb<8 - Pantoprazole 80mg  IV x1 then gtt 8mg /hr - Complete 72hr PPI drip vs 40mg  IV bid for post-EGD Stabilization - Empiric antibiotics with Vanco and Zosyn - Will order CT angio abdomen and pelvis - Check labs CBC, CMP and TSH - GI  Consult for EGD/Colonoscopy - Helicobacter pylori Ab + stool Ag.  If either positive, treat - Hold NSAIDs, steroids, ASA - Urgent surgery consult.  Message sent via Haiku to Dr. Marcy Siren  2. Ischemic gastritis - - Management as above - N.p.o. will start feeds once postpyloric feeding tube placed - Dietary consult - GI following  3. HFrEF - History of ischemic cardiomyopathy with EF of 30% - Continue Lasix  4. oronary Artery Disease  - Hold ASA 81mg  +Clopidogrel in the setting of ischemic gastritis - HTN, HLD, DM control as below  5. HLD  + Goal LDL<100 - Atorvastatin 80mg  PO qhs  6. HTN  + Goal BP <140/90 - Hold losartan  - Labetalol PRN  7. DM: His sugars have been well-controlled on current regimen  - Hold Jardiance in house - Low intensity sliding scale insulin coverage - FS BS qac and qhs    All the records are reviewed and case discussed with ED  provider. Management plans discussed with the patient, family and they are in agreement.  CODE STATUS: FULL  TOTAL TIME TAKING CARE OF THIS PATIENT: 50 minutes.     05/27/2019 at 11:49 AM  Webb SilversmithElizabeth Cammy Sanjurjo, DNP, FNP-BC [facility] Nurse Practitioner Between 7am to 6pm - Pager [phone]- [phone]  After 6pm go to [email or web address] - Social research officer, governmentpassword EPAS [facility]  [facility]  Office  [phone]  CC: Primary care physician; Trey SailorsPollak, Adriana M, PA-C

## 2019-05-27 NOTE — H&P (Signed)
Arlyss Repress, MD [address]  [address]  [place], [place] [address]  Main: [phone]  Fax: [phone] Pager: [phone]  Primary Care Physician:  Trey Sailors, PA-C Primary Gastroenterologist:  Dr. Arlyss Repress  Pre-Procedure History & Physical: HPI:  Kevin Case is a 78 y.o. male is here for an endoscopy.   Past Medical History:  Diagnosis Date  . Anemia   . Blood transfusion without reported diagnosis   . CAD (coronary artery disease)    a. 03/2019 Cath: LM nl, LAD 50p CA2+, 67m, D1 50ost, LCX 20p, 22m, RCA small, mild diff dzs-->Med rx. Consider PCI LCX for refractory angina.  . Carotid arterial disease (HCC)    a. 03/2019 Carotid U/S: RICA 1-39%, RECA <50%, LICA 1-39%, LECA <50%.  . CHF (congestive heart failure) (HCC)   . Clotting disorder (HCC)   . Diabetes mellitus without complication (HCC)   . GIB (gastrointestinal bleeding)    a. 03/2019 BRBPR following PV procedure req anticoagulation-->2u PRBCs.  . Heart murmur   . HFrEF (heart failure with reduced ejection fraction) (HCC)    a. 03/2019 Echo: EF 30-35%, mild conc LVH. Mildly dil LA. Mod MV annular dil. Mod AS (mean grad , Valve area 0.76).  Marland Kitchen History of tobacco abuse   . Hypertension   . Ischemic cardiomyopathy    a. 03/2019 Echo: EF 30-35%.  . Left bundle branch block   . Moderate aortic stenosis    a. 03/2019 Echo:  Mod AS (mean grad , Valve area 0.76).  Marland Kitchen PAD (peripheral artery disease) (HCC)    a. 03/2019 PV Angio: RCFA 80, RSFA 100.  . Rib fracture   . Sinus bradycardia   . Subclavian arterial stenosis (HCC)    a. 03/2019 PV Angio: RSCA 90 after origin of CCA. RCCA 30ost, LSCA 80 (PTA and covered stenting).    Past Surgical History:  Procedure Laterality Date  . AORTIC ARCH ANGIOGRAPHY N/A 03/31/2019   Procedure: AORTIC ARCH ANGIOGRAPHY;  Surgeon: Iran Ouch, MD;  [facility];  Service: Cardiovascular;  Laterality: N/A;  . CARDIAC CATHETERIZATION     . PERIPHERAL VASCULAR INTERVENTION Left 03/31/2019   Procedure: PERIPHERAL VASCULAR INTERVENTION;  Surgeon: Iran Ouch, MD;  [facility];  Service: Cardiovascular;  Laterality: Left;  . RIGHT/LEFT HEART CATH AND CORONARY ANGIOGRAPHY N/A 03/15/2019   Procedure: RIGHT/LEFT HEART CATH AND CORONARY ANGIOGRAPHY;  Surgeon: Iran Ouch, MD;  Location: [facility] INVASIVE CV LAB;  Service: Cardiovascular;  Laterality: N/A;    Prior to Admission medications   Medication Sig Start Date End Date Taking? Authorizing Provider  acetaminophen (TYLENOL) 500 MG tablet Take 1,000 mg by mouth every 6 (six) hours as needed (headache.).   Yes [provider]  aspirin 81 MG chewable tablet Chew 1 tablet (81 mg total) by mouth daily. 03/17/19  Yes Salary, Evelena Asa, MD  atorvastatin (LIPITOR) 80 MG tablet Take 80 mg by mouth at bedtime.  01/05/19  Yes [provider]  cyanocobalamin 100 MCG tablet Take 100 mcg by mouth daily.   Yes [provider]  docusate sodium (COLACE) 100 MG capsule Take 100 mg by mouth daily.    Yes [provider]  ferrous sulfate 325 (65 FE) MG EC tablet Take 1 tablet (325 mg total) by mouth 2 (two) times daily. 05/19/19 05/18/20 Yes Sudini, Wardell Heath, MD  furosemide (LASIX) 20 MG tablet Take 0.5 tablets (10 mg total) by mouth daily. 04/12/19 07/11/19  Yes Creig HinesBerge, Christopher Ronald, NP  JARDIANCE 10 MG TABS tablet Take 10 mg by mouth daily. 05/14/19 08/12/19 Yes Pollak, Lavella HammockAdriana M, PA-C  losartan (COZAAR) 25 MG tablet Take 0.5 tablets (12.5 mg total) by mouth daily. 04/09/19 07/08/19 Yes Creig HinesBerge, Christopher Ronald, NP  Melatonin 10 MG TABS Take 10 mg by mouth at bedtime.    Yes [provider]  Multiple Vitamin (MULTIVITAMIN WITH MINERALS) TABS tablet Take 1 tablet by mouth daily. Multivitamin for Senior 50+   Yes [provider]  Omega-3 Fatty Acids (FISH OIL) 1000 MG CAPS Take 1,000 mg by mouth daily.   Yes [provider]   omeprazole (PRILOSEC) 40 MG capsule Take 1 capsule (40 mg total) by mouth 2 (two) times a day. 05/19/19 06/18/19 Yes Sudini, Wardell HeathSrikar, MD  traMADol (ULTRAM) 50 MG tablet Take 1 tablet (50 mg total) by mouth every 6 (six) hours as needed. Patient taking differently: Take 50 mg by mouth every 6 (six) hours as needed (pain).  03/16/19  Yes Salary, Evelena AsaMontell D, MD    Allergies as of 05/19/2019  . (No Known Allergies)    Family History  Problem Relation Age of Onset  . Congestive Heart Failure Father        had AICD placed.   . Dementia Father   . Dementia Mother     Social History   Socioeconomic History  . Marital status: Married    Spouse name: Not on file  . Number of children: Not on file  . Years of education: Not on file  . Highest education level: Not on file  Occupational History  . Not on file  Social Needs  . Financial resource strain: Not on file  . Food insecurity    Worry: Not on file    Inability: Not on file  . Transportation needs    Medical: Not on file    Non-medical: Not on file  Tobacco Use  . Smoking status: Former Smoker    Packs/day: 1.50    Years: 57.00    Pack years: 85.50    Types: Cigarettes  . Smokeless tobacco: Never Used  Substance and Sexual Activity  . Alcohol use: No    Frequency: Never  . Drug use: Never  . Sexual activity: Not on file  Lifestyle  . Physical activity    Days per week: Not on file    Minutes per session: Not on file  . Stress: Not on file  Relationships  . Social Musicianconnections    Talks on phone: Not on file    Gets together: Not on file    Attends religious service: Not on file    Active member of club or organization: Not on file    Attends meetings of clubs or organizations: Not on file    Relationship status: Not on file  . Intimate partner violence    Fear of current or ex partner: Not on file    Emotionally abused: Not on file    Physically abused: Not on file    Forced sexual activity: Not on file  Other  Topics Concern  . Not on file  Social History Narrative  . Not on file    Review of Systems: See HPI, otherwise negative ROS  Physical Exam: BP (!) 92/40   Pulse (!) 58   Temp (!) 96.2 F (35.7 C) (Tympanic)   Resp 20   Ht 5\' 9"  (1.753 m)   Wt 69.4 kg   SpO2 95%  BMI 22.59 kg/m  General:   Alert,  pleasant and cooperative in NAD Head:  Normocephalic and atraumatic. Neck:  Supple; no masses or thyromegaly. Lungs:  Clear throughout to auscultation.    Heart:  Regular rate and rhythm. Abdomen:  Soft, nontender and nondistended. Normal bowel sounds, without guarding, and without rebound.   Neurologic:  Alert and  oriented x4;  grossly normal neurologically.  Impression/Plan: Sharl Ma is here for an endoscopy to be performed for recent h/o hematemesis  Risks, benefits, limitations, and alternatives regarding  endoscopy have been reviewed with the patient.  Questions have been answered.  All parties agreeable.   Sherri Sear, MD  05/27/2019, 9:08 AM

## 2019-05-27 NOTE — Progress Notes (Addendum)
Patient noted with low blood pressure 65/40 with a map of 49 no improvement with IV fluid bolus.  Patient is transferred to the ICU for pressors and close monitoring. 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  Consult placed to vascular surgery. Message sent to Dr. Lucky Cowboy via Raeanne Gathers.

## 2019-05-27 NOTE — Anesthesia Procedure Notes (Signed)
Date/Time: 05/27/2019 9:15 AM Performed by: Lavone Orn, CRNA Oxygen Delivery Method: Nasal cannula

## 2019-05-27 NOTE — Transfer of Care (Signed)
Immediate Anesthesia Transfer of Care Note  Patient: Nthony Wojdyla  Procedure(s) Performed: ESOPHAGOGASTRODUODENOSCOPY (EGD) WITH PROPOFOL (N/A )  Patient Location: PACU and Endoscopy Unit  Anesthesia Type:General  Level of Consciousness: drowsy  Airway & Oxygen Therapy: Patient Spontanous Breathing and Patient connected to nasal cannula oxygen  Post-op Assessment: Report given to RN and Post -op Vital signs reviewed and stable  Post vital signs: stable  Last Vitals:  Vitals Value Taken Time  BP 143/48 05/27/19 0954  Temp 36.2 C 05/27/19 0954  Pulse 71 05/27/19 0954  Resp 21 05/27/19 0954  SpO2 97 % 05/27/19 0954    Last Pain:  Vitals:   05/27/19 0954  TempSrc: Tympanic  PainSc: 0-No pain         Complications: No apparent anesthesia complications

## 2019-05-27 NOTE — Progress Notes (Signed)
Brief GI note  Patient presented for outpatient EGD today to evaluate for hematemesis.  He was admitted last week for hematemesis and EGD was deferred at that time due to pneumonia.  He is found to have severe ischemic gastritis on exam today.  Some of the oozing ulcers were treated with epinephrine injection only.  He does have severe ischemic cardiomyopathy with EF of 30%, likely attributing to ischemic gastritis  Discussed my findings with patient's wife.  Apparently, since his discharge, he had poor p.o. intake associated with nausea and vomiting.  Recommend admission and she agreed.  Patient's wife is HIS healthcare power of attorney.  Recommendations: Urgent surgery consult N.p.o. for now Check CBC, BMP CT angio based on his renal function to evaluate for any celiac artery stenosis Pantoprazole drip Broad-spectrum antibiotics Avoid anticoagulation Overall prognosis is poor Dr. Vicente Males will cover from tomorrow    [name], MD [address]  [place]  [place], [place] [address]  Main: [phone]  Fax: [phone] Pager: [phone]

## 2019-05-27 NOTE — Consult Note (Signed)
[facility] SURGICAL CONSULTATION NOTE (initial) - cpt: 60454: [id number]  HISTORY OF PRESENT ILLNESS (HPI):  78 y.o. male [facility] today for scheduled EGD with Dr Allegra LaiVanga. [facility] for upper GI bleeding but was also found to have PNA, so EGD was deferred as outpatient. EGD was preformed today which was concerning for ischemic gastritis Patient seen and examined with wife at bedside on inpatient unit. Patients wife reports his health as of late has been slowly declining. He has reportedly had epigastric abdominal pain and frequent nausea and emesis for quite sometime. He has only been able to tolerate small amounts of PO intake primarily Ensure. He has also become more weak as of late. No fever, chills. Of note, he has a significant history of arterial disease including CAD, Carotid stenosis, subclavian stenosis, and peripheral arterial disease. Echo in 2015 showed EF of 30-35%. He was admitted to the medicine service for work up.    Surgery is consulted by hospitalist provider Webb SilversmithElizabeth Ouma, NP in this context for evaluation and management of possible ischemic gastritis.   PAST MEDICAL HISTORY (PMH):  Past Medical History:  Diagnosis Date  . Anemia   . Blood transfusion without reported diagnosis   . CAD (coronary artery disease)    a. 03/2019 Cath: LM nl, LAD 50p CA2+, 2468m, D1 50ost, LCX 20p, 7277m, RCA small, mild diff dzs-->Med rx. Consider PCI LCX for refractory angina.  . Carotid arterial disease (HCC)    a. 03/2019 Carotid U/S: RICA 1-39%, RECA <50%, LICA 1-39%, LECA <50%.  . CHF (congestive heart failure) (HCC)   . Clotting disorder (HCC)   . Diabetes mellitus without complication (HCC)   . GIB (gastrointestinal bleeding)    a. 03/2019 BRBPR following PV procedure req anticoagulation-->2u PRBCs.  . Heart murmur   . HFrEF (heart failure with reduced ejection fraction) (HCC)    a. 03/2019 Echo: EF 30-35%, mild conc LVH. Mildly dil LA. Mod MV  annular dil. Mod AS (mean grad 18mmHg, Valve area 0.76).  Marland Kitchen. History of tobacco abuse   . Hypertension   . Ischemic cardiomyopathy    a. 03/2019 Echo: EF 30-35%.  . Left bundle branch block   . Moderate aortic stenosis    a. 03/2019 Echo:  Mod AS (mean grad 18mmHg, Valve area 0.76).  Marland Kitchen. PAD (peripheral artery disease) (HCC)    a. 03/2019 PV Angio: RCFA 80, RSFA 100.  . Rib fracture   . Sinus bradycardia   . Subclavian arterial stenosis (HCC)    a. 03/2019 PV Angio: RSCA 90 after origin of CCA. RCCA 30ost, LSCA 80 (PTA and covered stenting).     PAST SURGICAL HISTORY (PSH):  Past Surgical History:  Procedure Laterality Date  . AORTIC ARCH ANGIOGRAPHY N/A 03/31/2019   Procedure: AORTIC ARCH ANGIOGRAPHY;  Surgeon: Iran OuchArida, Muhammad A, MD;  [facility];  Service: Cardiovascular;  Laterality: N/A;  . CARDIAC CATHETERIZATION    . PERIPHERAL VASCULAR INTERVENTION Left 03/31/2019   Procedure: PERIPHERAL VASCULAR INTERVENTION;  Surgeon: Iran OuchArida, Muhammad A, MD;  [facility]: [facility];  Service: Cardiovascular;  Laterality: Left;  . RIGHT/LEFT HEART CATH AND CORONARY ANGIOGRAPHY N/A 03/15/2019   Procedure: RIGHT/LEFT HEART CATH AND CORONARY ANGIOGRAPHY;  Surgeon: Iran OuchArida, Muhammad A, MD;  Location: [facility] INVASIVE CV LAB;  Service: Cardiovascular;  Laterality: N/A;     MEDICATIONS:  Prior to Admission medications   Medication Sig Start Date End Date Taking? Authorizing Provider  acetaminophen (TYLENOL)  500 MG tablet Take 1,000 mg by mouth every 6 (six) hours as needed (headache.).   Yes [provider]  aspirin 81 MG chewable tablet Chew 1 tablet (81 mg total) by mouth daily. 03/17/19  Yes Salary, Evelena Asa, MD  atorvastatin (LIPITOR) 80 MG tablet Take 80 mg by mouth at bedtime.  01/05/19  Yes [provider]  cyanocobalamin 100 MCG tablet Take 100 mcg by mouth daily.   Yes [provider]  docusate sodium (COLACE) 100 MG capsule Take 100 mg by mouth daily.     Yes [provider]  ferrous sulfate 325 (65 FE) MG EC tablet Take 1 tablet (325 mg total) by mouth 2 (two) times daily. 05/19/19 05/18/20 Yes Sudini, Wardell Heath, MD  furosemide (LASIX) 20 MG tablet Take 0.5 tablets (10 mg total) by mouth daily. 04/12/19 07/11/19 Yes Creig Hines, NP  JARDIANCE 10 MG TABS tablet Take 10 mg by mouth daily. 05/14/19 08/12/19 Yes Pollak, Lavella Hammock, PA-C  losartan (COZAAR) 25 MG tablet Take 0.5 tablets (12.5 mg total) by mouth daily. 04/09/19 07/08/19 Yes Creig Hines, NP  Melatonin 10 MG TABS Take 10 mg by mouth at bedtime.    Yes [provider]  Multiple Vitamin (MULTIVITAMIN WITH MINERALS) TABS tablet Take 1 tablet by mouth daily. Multivitamin for Senior 50+   Yes [provider]  Omega-3 Fatty Acids (FISH OIL) 1000 MG CAPS Take 1,000 mg by mouth daily.   Yes [provider]  omeprazole (PRILOSEC) 40 MG capsule Take 1 capsule (40 mg total) by mouth 2 (two) times a day. 05/19/19 06/18/19 Yes Sudini, Wardell Heath, MD  traMADol (ULTRAM) 50 MG tablet Take 1 tablet (50 mg total) by mouth every 6 (six) hours as needed. Patient taking differently: Take 50 mg by mouth every 6 (six) hours as needed (pain).  03/16/19  Yes Salary, Evelena Asa, MD     ALLERGIES:  No Known Allergies   SOCIAL HISTORY:  Social History   Socioeconomic History  . Marital status: Married    Spouse name: Not on file  . Number of children: Not on file  . Years of education: Not on file  . Highest education level: Not on file  Occupational History  . Not on file  Social Needs  . Financial resource strain: Not on file  . Food insecurity    Worry: Not on file    Inability: Not on file  . Transportation needs    Medical: Not on file    Non-medical: Not on file  Tobacco Use  . Smoking status: Former Smoker    Packs/day: 1.50    Years: 57.00    Pack years: 85.50    Types: Cigarettes  . Smokeless tobacco: Never Used  Substance and Sexual Activity  .  Alcohol use: No    Frequency: Never  . Drug use: Never  . Sexual activity: Not on file  Lifestyle  . Physical activity    Days per week: Not on file    Minutes per session: Not on file  . Stress: Not on file  Relationships  . Social Musician on phone: Not on file    Gets together: Not on file    Attends religious service: Not on file    Active member of club or organization: Not on file    Attends meetings of clubs or organizations: Not on file    Relationship status: Not on file  . Intimate partner violence  Fear of current or ex partner: Not on file    Emotionally abused: Not on file    Physically abused: Not on file    Forced sexual activity: Not on file  Other Topics Concern  . Not on file  Social History Narrative  . Not on file     FAMILY HISTORY:  Family History  Problem Relation Age of Onset  . Congestive Heart Failure Father        had AICD placed.   . Dementia Father   . Dementia Mother       REVIEW OF SYSTEMS:  Review of Systems  Constitutional: Positive for malaise/fatigue and weight loss. Negative for chills and fever.  HENT: Negative for congestion and sore throat.   Respiratory: Negative for cough and shortness of breath.   Cardiovascular: Negative for chest pain and palpitations.  Gastrointestinal: Positive for abdominal pain, nausea and vomiting.  Genitourinary: Negative for dysuria and urgency.  Neurological: Positive for weakness. Negative for dizziness.  All other systems reviewed and are negative.   VITAL SIGNS:  Temp:  [96.2 F (35.7 C)-97.1 F (36.2 C)] 97.1 F (36.2 C) (07/23 0954) Pulse Rate:  [58-71] 61 (07/23 1124) Resp:  [16-24] 20 (07/23 1124) BP: (92-176)/(40-57) 121/50 (07/23 1124) SpO2:  [95 %-100 %] 100 % (07/23 1124) Weight:  [69.4 kg] 69.4 kg (07/23 0829)     Height: 5\' 9"  (175.3 cm) Weight: 69.4 kg BMI (Calculated): 22.58   INTAKE/OUTPUT:  No intake/output data recorded.  PHYSICAL EXAM:  Physical  Exam Vitals signs and nursing note reviewed.  Constitutional:      General: He is not in acute distress.    Appearance: He is underweight. He is not ill-appearing.     Comments: Very frail and thin male  HENT:     Head: Normocephalic and atraumatic.  Eyes:     General: No scleral icterus.    Conjunctiva/sclera: Conjunctivae normal.  Cardiovascular:     Rate and Rhythm: Normal rate and regular rhythm.     Pulses: Normal pulses.  Pulmonary:     Effort: Pulmonary effort is normal. No respiratory distress.     Breath sounds: Normal breath sounds.  Abdominal:     General: Abdomen is flat. There is no distension.     Palpations: Abdomen is soft.     Tenderness: There is abdominal tenderness in the epigastric area and left upper quadrant. There is no guarding or rebound.     Comments: Mild epigastric/LLQ tenderness, no peritonitis  Genitourinary:    Comments: Deferred Musculoskeletal:        General: No swelling or tenderness.  Skin:    General: Skin is warm and dry.     Coloration: Skin is not pale.     Findings: No erythema.  Neurological:     General: No focal deficit present.     Mental Status: He is alert. He is disoriented.  Psychiatric:        Mood and Affect: Mood normal.        Behavior: Behavior normal. Behavior is cooperative.      Labs:  CBC Latest Ref Rng & Units 05/27/2019 05/19/2019 05/18/2019  WBC 4.0 - 10.5 K/uL 13.2(H) 12.2(H) 13.6(H)  Hemoglobin 13.0 - 17.0 g/dL 11.7(L) 9.2(L) 9.9(L)  Hematocrit 39.0 - 52.0 % 38.4(L) 30.4(L) 33.0(L)  Platelets 150 - 400 K/uL 237 157 172   CMP Latest Ref Rng & Units 05/17/2019 05/16/2019 05/04/2019  Glucose 70 - 99 mg/dL 176(H) 171(H) 110(H)  BUN  8 - 23 mg/dL 13(K28(H) 44(W26(H) 26  Creatinine 0.61 - 1.24 mg/dL 1.021.15 7.251.23 3.66(Y1.36(H)  Sodium 135 - 145 mmol/L 142 137 138  Potassium 3.5 - 5.1 mmol/L 4.0 3.8 4.3  Chloride 98 - 111 mmol/L 111 104 103  CO2 22 - 32 mmol/L 23 25 21   Calcium 8.9 - 10.3 mg/dL 9.0 9.3 9.3  Total Protein 6.5 -  8.1 g/dL - 7.9 7.2  Total Bilirubin 0.3 - 1.2 mg/dL - 0.8 0.3  Alkaline Phos 38 - 126 U/L - 65 72  AST 15 - 41 U/L - 39 41(H)  ALT 0 - 44 U/L - 31 32     EGD on 05/27/2019 with Dr Allegra LaiVanga reviewed:  Impression:            - Normal duodenal bulb and second portion of the duodenum. - Oozing gastric ulcers with oozing hemorrhage ([place] Class Ib). Injected. - Ischemic gastritis resulting in necrotic ulcer in  gastric body - Normal gastroesophageal junction and esophagus.  Imaging studies:   CTA Abdomen/Pelvis (05/27/2019) pending   Assessment/Plan: (ICD-10's: K55.9) 78 y.o. male with recent abdominal pain, nausea, emesis who underwent EGD today with gastroenterology which is concerning for possible ischemic gastritis, complicated by pertinent comorbidities including a multitude of significant comorbidities, debilitation, and advanced age.   - No indication for emergent surgical intervention; patient is debilitated with significant arterial disease history and poor surgical candidate  - Agree with evaluation with CTA to assess intra-abdominal vasculature  - Diet per GI, pending work-up likely needs nutritional supplementation  - Pain control prn (avoid NSAIDs)  - Monitor abdominal examination  - He certainly needs medical optimization  - further management per primary team   All of the above findings and recommendations were discussed with the patient and his wife at bedside, and all of patient's and his family's questions were answered to their expressed satisfaction.  Thank you for the opportunity to participate in this patient's care.   -- Lynden OxfordZachary , PA-C [facility] 05/27/2019, 11:32 AM [phone] M-F: 7am - 4pm

## 2019-05-27 NOTE — Anesthesia Preprocedure Evaluation (Addendum)
Anesthesia Evaluation  Patient identified by MRN, date of birth, ID band Patient awake    Reviewed: Allergy & Precautions, NPO status , Patient's Chart, lab work & pertinent test results  Airway Mallampati: III       Dental   Pulmonary COPD, former smoker,    Pulmonary exam normal        Cardiovascular hypertension, + CAD, + Past MI, + Peripheral Vascular Disease and +CHF  + Valvular Problems/Murmurs AS      Neuro/Psych negative neurological ROS  negative psych ROS   GI/Hepatic Neg liver ROS,   Endo/Other  diabetes  Renal/GU Renal InsufficiencyRenal disease  negative genitourinary   Musculoskeletal   Abdominal Normal abdominal exam  (+)   Peds negative pediatric ROS (+)  Hematology  (+) anemia ,   Anesthesia Other Findings Past Medical History: No date: Anemia No date: Blood transfusion without reported diagnosis No date: CAD (coronary artery disease)     Comment:  a. 03/2019 Cath: LM nl, LAD 50p CA2+, 30m, D1 50ost, LCX               20p, 7m, RCA small, mild diff dzs-->Med rx. Consider PCI              LCX for refractory angina. No date: Carotid arterial disease ([place])     Comment:  a. 03/2019 Carotid U/S: RICA 6-16%, RECA <07%, LICA               3-71%, LECA <50%. No date: CHF (congestive heart failure) (HCC) No date: Clotting disorder ([place]) No date: Diabetes mellitus without complication (HCC) No date: GIB (gastrointestinal bleeding)     Comment:  a. 03/2019 BRBPR following PV procedure req               anticoagulation-->2u PRBCs. No date: Heart murmur No date: HFrEF (heart failure with reduced ejection fraction) ([place])     Comment:  a. 03/2019 Echo: EF 30-35%, mild conc LVH. Mildly dil LA.              Mod MV annular dil. Mod AS (mean grad 72mmHg, Valve area               0.76). No date: History of tobacco abuse No date: Hypertension No date: Ischemic cardiomyopathy     Comment:  a. 03/2019 Echo: EF  30-35%. No date: Left bundle branch block No date: Moderate aortic stenosis     Comment:  a. 03/2019 Echo:  Mod AS (mean grad 12mmHg, Valve area               0.76). No date: PAD (peripheral artery disease) ([place])     Comment:  a. 03/2019 PV Angio: RCFA 80, RSFA 100. No date: Rib fracture No date: Sinus bradycardia No date: Subclavian arterial stenosis (HCC)     Comment:  a. 03/2019 PV Angio: RSCA 90 after origin of CCA. RCCA               30ost, LSCA 80 (PTA and covered stenting).  Reproductive/Obstetrics                            Anesthesia Physical Anesthesia Plan  ASA: IV  Anesthesia Plan: General   Post-op Pain Management:    Induction: Intravenous  PONV Risk Score and Plan:   Airway Management Planned: Nasal Cannula  Additional Equipment:   Intra-op Plan:   Post-operative Plan:   Informed Consent: I  have reviewed the patients History and Physical, chart, labs and discussed the procedure including the risks, benefits and alternatives for the proposed anesthesia with the patient or authorized representative who has indicated his/her understanding and acceptance.     Dental advisory given  Plan Discussed with: CRNA and Surgeon  Anesthesia Plan Comments:         Anesthesia Quick Evaluation

## 2019-05-27 NOTE — Progress Notes (Signed)
Kevin Case,Kevin Case made aware that her husband was transferred to ICU 6 due to low BP.

## 2019-05-27 NOTE — OR Nursing (Signed)
Dr. Kayleen Memos into see pt, pulse registers 60's heart rate doubling per monitor. Pt is ok.

## 2019-05-27 NOTE — Progress Notes (Signed)
[facility] Progress Note Patient Name: Kevin Case DOB: 1941/06/11 MRN: [id number]   Date of Service  05/27/2019  HPI/Events of Note  Pt with multiple co-morbidities admitted s/p EGD secondary to GI bleeding from suspected ischemic gastritis. Pt transferred to the ICU due to hypotension. Last hemoglobin is 10.3 gm.  eICU Interventions  Pt seen via camera and records reviewed, serial H & H and Protonix infusion to continue. New pt evaluation completed.        Kerry Kass Toniya Rozar 05/27/2019, 9:24 PM

## 2019-05-28 ENCOUNTER — Encounter
Admission: RE | Disposition: A | Discharge status: Home / Self Care | Payer: Self-pay | Source: Home / Self Care | Attending: Internal Medicine

## 2019-05-28 ENCOUNTER — Telehealth: Payer: Self-pay | Admitting: Gastroenterology

## 2019-05-28 ENCOUNTER — Other Ambulatory Visit: Payer: Self-pay

## 2019-05-28 ENCOUNTER — Other Ambulatory Visit (INDEPENDENT_AMBULATORY_CARE_PROVIDER_SITE_OTHER): Payer: Self-pay | Admitting: Vascular Surgery

## 2019-05-28 ENCOUNTER — Encounter: Payer: Self-pay | Admitting: Gastroenterology

## 2019-05-28 DIAGNOSIS — K551 Chronic vascular disorders of intestine: Secondary | ICD-10-CM

## 2019-05-28 DIAGNOSIS — K559 Vascular disorder of intestine, unspecified: Secondary | ICD-10-CM

## 2019-05-28 DIAGNOSIS — I700 Atherosclerosis of aorta: Secondary | ICD-10-CM

## 2019-05-28 DIAGNOSIS — K55069 Acute infarction of intestine, part and extent unspecified: Secondary | ICD-10-CM

## 2019-05-28 DIAGNOSIS — I998 Other disorder of circulatory system: Secondary | ICD-10-CM

## 2019-05-28 DIAGNOSIS — I739 Peripheral vascular disease, unspecified: Secondary | ICD-10-CM

## 2019-05-28 DIAGNOSIS — I6523 Occlusion and stenosis of bilateral carotid arteries: Secondary | ICD-10-CM

## 2019-05-28 DIAGNOSIS — K274 Chronic or unspecified peptic ulcer, site unspecified, with hemorrhage: Secondary | ICD-10-CM

## 2019-05-28 DIAGNOSIS — Z7189 Other specified counseling: Secondary | ICD-10-CM

## 2019-05-28 DIAGNOSIS — K2960 Other gastritis without bleeding: Secondary | ICD-10-CM

## 2019-05-28 DIAGNOSIS — Z87891 Personal history of nicotine dependence: Secondary | ICD-10-CM

## 2019-05-28 DIAGNOSIS — I70208 Unspecified atherosclerosis of native arteries of extremities, other extremity: Secondary | ICD-10-CM

## 2019-05-28 DIAGNOSIS — Z515 Encounter for palliative care: Secondary | ICD-10-CM

## 2019-05-28 HISTORY — PX: VISCERAL ANGIOGRAPHY: CATH118276

## 2019-05-28 LAB — CBC WITH DIFFERENTIAL/PLATELET
Abs Immature Granulocytes: 0.04 10*3/uL (ref 0.00–0.07)
Basophils Absolute: 0 10*3/uL (ref 0.0–0.1)
Basophils Relative: 0 %
Eosinophils Absolute: 0.1 10*3/uL (ref 0.0–0.5)
Eosinophils Relative: 1 %
HCT: 32.8 % — ABNORMAL LOW (ref 39.0–52.0)
Hemoglobin: 10.1 g/dL — ABNORMAL LOW (ref 13.0–17.0)
Immature Granulocytes: 0 %
Lymphocytes Relative: 19 %
Lymphs Abs: 1.8 10*3/uL (ref 0.7–4.0)
MCH: 26.7 pg (ref 26.0–34.0)
MCHC: 30.8 g/dL (ref 30.0–36.0)
MCV: 86.8 fL (ref 80.0–100.0)
Monocytes Absolute: 1 10*3/uL (ref 0.1–1.0)
Monocytes Relative: 11 %
Neutro Abs: 6.7 10*3/uL (ref 1.7–7.7)
Neutrophils Relative %: 69 %
Platelets: 204 10*3/uL (ref 150–400)
RBC: 3.78 MIL/uL — ABNORMAL LOW (ref 4.22–5.81)
RDW: 24.9 % — ABNORMAL HIGH (ref 11.5–15.5)
WBC: 9.8 10*3/uL (ref 4.0–10.5)
nRBC: 0 % (ref 0.0–0.2)

## 2019-05-28 LAB — BASIC METABOLIC PANEL
Anion gap: 5 (ref 5–15)
BUN: 20 mg/dL (ref 8–23)
CO2: 22 mmol/L (ref 22–32)
Calcium: 8 mg/dL — ABNORMAL LOW (ref 8.9–10.3)
Chloride: 111 mmol/L (ref 98–111)
Creatinine, Ser: 1.35 mg/dL — ABNORMAL HIGH (ref 0.61–1.24)
GFR calc Af Amer: 59 mL/min — ABNORMAL LOW (ref >60)
GFR calc non Af Amer: 51 mL/min — ABNORMAL LOW (ref >60)
Glucose, Bld: 151 mg/dL — ABNORMAL HIGH (ref 70–99)
Potassium: 3.8 mmol/L (ref 3.5–5.1)
Sodium: 138 mmol/L (ref 135–145)

## 2019-05-28 LAB — HEMOGLOBIN AND HEMATOCRIT, BLOOD
HCT: 33.2 % — ABNORMAL LOW (ref 39.0–52.0)
HCT: 33.1 % — ABNORMAL LOW (ref 39.0–52.0)
HCT: 35.2 % — ABNORMAL LOW (ref 39.0–52.0)
HCT: 34.6 % — ABNORMAL LOW (ref 39.0–52.0)
Hemoglobin: 10.3 g/dL — ABNORMAL LOW (ref 13.0–17.0)
Hemoglobin: 10.2 g/dL — ABNORMAL LOW (ref 13.0–17.0)
Hemoglobin: 10.9 g/dL — ABNORMAL LOW (ref 13.0–17.0)
Hemoglobin: 10.7 g/dL — ABNORMAL LOW (ref 13.0–17.0)

## 2019-05-28 LAB — SARS CORONAVIRUS 2 BY RT PCR (HOSPITAL ORDER, PERFORMED IN ~~LOC~~ HOSPITAL LAB): SARS Coronavirus 2: NEGATIVE

## 2019-05-28 LAB — MAGNESIUM: Magnesium: 2.2 mg/dL (ref 1.7–2.4)

## 2019-05-28 LAB — PHOSPHORUS: Phosphorus: 3.2 mg/dL (ref 2.5–4.6)

## 2019-05-28 LAB — LACTIC ACID, PLASMA: Lactic Acid, Venous: 0.8 mmol/L (ref 0.5–1.9)

## 2019-05-28 LAB — GLUCOSE, CAPILLARY
Glucose-Capillary: 84 mg/dL (ref 70–99)
Glucose-Capillary: 85 mg/dL (ref 70–99)

## 2019-05-28 LAB — PROCALCITONIN: Procalcitonin: <0.1 ng/mL

## 2019-05-28 SURGERY — VISCERAL ANGIOGRAPHY
Anesthesia: Moderate Sedation

## 2019-05-28 MED ORDER — HEPARIN SODIUM (PORCINE) 1000 UNIT/ML IJ SOLN
INTRAMUSCULAR | Status: DC | PRN
  Administered 2019-05-28: 4000 [IU] via INTRAVENOUS

## 2019-05-28 MED ORDER — FENTANYL CITRATE (PF) 100 MCG/2ML IJ SOLN
INTRAMUSCULAR | Status: DC | PRN
  Administered 2019-05-28 (×2): 25 ug via INTRAVENOUS

## 2019-05-28 MED ORDER — SODIUM CHLORIDE 0.9 % IV SOLN
INTRAVENOUS | Status: DC
  Administered 2019-05-28: 14:00:00 via INTRAVENOUS

## 2019-05-28 MED ORDER — FAMOTIDINE 20 MG PO TABS: 40.0000 mg | ORAL_TABLET | Freq: Once | ORAL | Status: DC | PRN

## 2019-05-28 MED ORDER — METHYLPREDNISOLONE SODIUM SUCC 125 MG IJ SOLR: 125.0000 mg | Freq: Once | INTRAMUSCULAR | Status: DC | PRN

## 2019-05-28 MED ORDER — HYDROMORPHONE HCL 1 MG/ML IJ SOLN
1.0000 mg | Freq: Once | INTRAMUSCULAR | Status: DC | PRN
  Filled 2019-05-28: qty 1

## 2019-05-28 MED ORDER — MIDAZOLAM HCL 2 MG/2ML IJ SOLN
INTRAMUSCULAR | Status: DC | PRN
  Administered 2019-05-28 (×2): 1 mg via INTRAVENOUS

## 2019-05-28 MED ORDER — CEFAZOLIN SODIUM-DEXTROSE 1-4 GM/50ML-% IV SOLN
1.0000 g | Freq: Once | INTRAVENOUS | Status: AC
  Administered 2019-05-28: 1 g via INTRAVENOUS

## 2019-05-28 MED ORDER — IODIXANOL 320 MG/ML IV SOLN
INTRAVENOUS | Status: DC | PRN
  Administered 2019-05-28: 100 mL via INTRA_ARTERIAL

## 2019-05-28 MED ORDER — DIPHENHYDRAMINE HCL 50 MG/ML IJ SOLN: 50.0000 mg | Freq: Once | INTRAMUSCULAR | Status: DC | PRN

## 2019-05-28 MED ORDER — ONDANSETRON HCL 4 MG/2ML IJ SOLN
4.0000 mg | Freq: Four times a day (QID) | INTRAMUSCULAR | Status: DC | PRN
  Administered 2019-05-28: 23:00:00 4 mg via INTRAVENOUS
  Filled 2019-05-28: qty 2

## 2019-05-28 MED ORDER — MIDAZOLAM HCL 2 MG/ML PO SYRP: 8.0000 mg | ORAL_SOLUTION | Freq: Once | ORAL | Status: DC | PRN

## 2019-05-28 MED ORDER — HEPARIN SODIUM (PORCINE) 1000 UNIT/ML IJ SOLN
INTRAMUSCULAR | Status: AC
  Filled 2019-05-28: qty 1

## 2019-05-28 MED ORDER — CEFAZOLIN SODIUM-DEXTROSE 1-4 GM/50ML-% IV SOLN
INTRAVENOUS | Status: AC
  Administered 2019-05-28: 1 g via INTRAVENOUS
  Filled 2019-05-28: qty 50

## 2019-05-28 MED ORDER — NOREPINEPHRINE 4 MG/250ML-% IV SOLN
0.0000 ug/min | INTRAVENOUS | Status: DC
  Filled 2019-05-28: qty 250

## 2019-05-28 MED ORDER — FENTANYL CITRATE (PF) 100 MCG/2ML IJ SOLN
INTRAMUSCULAR | Status: AC
  Filled 2019-05-28: qty 2

## 2019-05-28 MED ORDER — MIDAZOLAM HCL 5 MG/5ML IJ SOLN
INTRAMUSCULAR | Status: AC
  Filled 2019-05-28: qty 5

## 2019-05-28 SURGICAL SUPPLY — 30 items
BALLN DORADO 4X100X135 (BALLOONS) ×3
BALLN DORADO 5X60X80 (BALLOONS) ×3
BALLN DORADO 7X40X80 (BALLOONS) ×3
BALLN ULTRV 018 3X100X75 (BALLOONS) ×3
BALLN ULTRV 018 3X40X75 (BALLOONS) ×3
BALLN ULTRVRSE 5X60X130C (BALLOONS) ×3
BALLOON DORADO 4X100X135 (BALLOONS) IMPLANT
BALLOON DORADO 5X60X80 (BALLOONS) IMPLANT
BALLOON DORADO 7X40X80 (BALLOONS) IMPLANT
BALLOON ULTRV 018 3X100X75 (BALLOONS) IMPLANT
BALLOON ULTRV 018 3X40X75 (BALLOONS) IMPLANT
BALLOON ULTRVRSE 5X60X130C (BALLOONS) IMPLANT
CATH BEACON 5 .035 65 C2 TIP (CATHETERS) ×2 IMPLANT
CATH PIG 70CM (CATHETERS) ×2 IMPLANT
CATH SEEKER .035X90 (CATHETERS) ×2 IMPLANT
DEVICE PRESTO INFLATION (MISCELLANEOUS) ×2 IMPLANT
DEVICE STARCLOSE SE CLOSURE (Vascular Products) ×2 IMPLANT
DEVICE TORQUE .025-.038 (MISCELLANEOUS) ×2 IMPLANT
GLIDECATH 4FR STR (CATHETERS) ×2 IMPLANT
GLIDEWIRE ADV .035X260CM (WIRE) ×2 IMPLANT
GLIDEWIRE STIFF .35X180X3 HYDR (WIRE) ×2 IMPLANT
GUIDEWIRE ADV .018X180CM (WIRE) ×2 IMPLANT
PACK ANGIOGRAPHY (CUSTOM PROCEDURE TRAY) ×3 IMPLANT
SHEATH ANL2 6FRX45 HC (SHEATH) ×2 IMPLANT
SHEATH BRITE TIP 5FRX11 (SHEATH) ×2 IMPLANT
STENT LIFESTREAM 6X26X80 (Permanent Stent) ×2 IMPLANT
SYR MEDRAD MARK 7 150ML (SYRINGE) ×2 IMPLANT
TUBING CONTRAST HIGH PRESS 72 (TUBING) ×3 IMPLANT
WIRE J 3MM .035X145CM (WIRE) ×2 IMPLANT
WIRE MAGIC TORQUE 260C (WIRE) ×2 IMPLANT

## 2019-05-28 NOTE — Consult Note (Signed)
Consultation Note Date: 05/28/2019   Patient Name: Kevin Case  DOB: 01/23/1941  MRN: [id number]  Age / Sex: 78 y.o., male  PCP: Trey SailorsPollak, Adriana M, PA-C Referring Physician: Enedina FinnerPatel, Sona, MD  Reason for Consultation: Establishing goals of care  HPI/Patient Profile: 78 y.o. male with pertinent past medical history of CAD, ischemic cardiomyopathy, hypertension, tobacco abuse, HFR EF, diabetes mellitus without complication, PAD, subclavian stenosis, PUD, and GIB presenting as direct admit from endoscopy suite with ischemic gastritis status post EGD.   Clinical Assessment and Goals of Care: Patient is resting in bed. He states he is married and has a child. He has 7 acres of land that he enjoys working on. He is retired from the [employer]. He is a man of faith and tells me he and his wife went to church until around a year ago when they started watching from home. We discuss how he was able to stop drinking and smoking "cold [place]". He tells me of the donations he gave to help people in other countries.   He spoke in vivid detail about his previous healthcare including a catheterization procedure in the past. He spoke in vivid detail about his wife's healthcare including dental implants.   He states he was diagnosed with gastric ulcers several years ago which was managed with Omeprazole. He states prior to this hospitalization he was feeling fine, but suddenly was unable to eat and vomited food for a couple of days, and then blood.    We discussed his diagnosis, prognosis, GOC, EOL wishes disposition and options.  A detailed discussion was had today regarding advanced directives.  Concepts specific to code status, artifical feeding and hydration, IV antibiotics and rehospitalization were discussed. Values and goals of care important to patient and family were attempted to be elicited. Discussed  limitations of medical interventions to prolong quality of life in some situations and discussed the concept of human mortality.   He states he has completed advanced directives, and has already planned and paid for his funeral arrangements. He states he and his wife have talked multiple times about advanced directives. 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 He states it is a painful procedure and the tube is not comfortable. He is unsure of how he would feel about a permanent feeding tube. He states he would want CPR 1 time only. He states he would not want to live in a vegetative state and stated that if he were to require a ventilator, he would only want it for "a day and a half." He states dignity is most important to him, and since being in the hospital, someone had to clean him; he states "it was horrible to need that". He adds that this is not exactly what he and  his wife discussed previously.   Kevin Case becomes tearful and tells me he is ready to go to heaven and see his mother and father. He states he is continuing to try life prolonging measures for his wife as neither would not want to live without the other.The difference between an aggressive medical intervention path and a comfort care path was discussed. He states his mother and father both died under hospice care. He discusses the experience as positive.        Staff in to sign consent and take him to a procedure. He asks me not to speak to his wife until we talk again on Monday.   SUMMARY OF RECOMMENDATIONS   Continue full scope/ full code.  Requested that I not speak to wife until we meet again Monday.  Will follow up Monday.   Prognosis:   Poor  Discharge Planning: To Be Determined      Primary Diagnoses:  Present on Admission: **None**   I have reviewed the medical record, interviewed the patient and family, and examined the patient. The following aspects are pertinent.  Past Medical History:  Diagnosis Date  . Anemia   . Blood transfusion without reported diagnosis   . CAD (coronary artery disease)    a. 03/2019 Cath: LM nl, LAD 50p CA2+, 8037m, D1 50ost, LCX 20p, 3554m, RCA small, mild diff dzs-->Med rx. Consider PCI LCX for refractory angina.  . Carotid arterial disease (HCC)    a. 03/2019 Carotid U/S: RICA 1-39%, RECA <50%, LICA 1-39%, LECA <50%.  . CHF (congestive heart failure) (HCC)   . Clotting disorder (HCC)   . Diabetes mellitus without complication (HCC)   . GIB (gastrointestinal bleeding)    a. 03/2019 BRBPR following PV procedure req anticoagulation-->2u PRBCs.  . Heart murmur   . HFrEF (heart failure with reduced ejection fraction) (HCC)    a. 03/2019 Echo: EF 30-35%, mild conc LVH. Mildly dil LA. Mod MV annular dil. Mod AS (mean grad 18mmHg, Valve area 0.76).  Marland Kitchen. History of tobacco abuse   . Hypertension   . Ischemic cardiomyopathy    a. 03/2019 Echo: EF 30-35%.  . Left bundle branch block   . Moderate aortic stenosis    a. 03/2019 Echo:  Mod AS (mean grad 18mmHg, Valve area 0.76).  Marland Kitchen. PAD (peripheral artery disease) (HCC)    a. 03/2019 PV Angio: RCFA 80, RSFA 100.  . Rib fracture   . Sinus bradycardia   . Subclavian arterial stenosis (HCC)    a. 03/2019 PV Angio: RSCA 90 after origin of CCA. RCCA 30ost, LSCA 80 (PTA and covered stenting).   Social History   Socioeconomic History  . Marital status: Married    Spouse name: Not on file  . Number of children: Not on file  . Years of education: Not on file  . Highest education level: Not on file  Occupational History  . Not on file  Social Needs  . Financial resource strain: Not on file  . Food insecurity    Worry: Not on file    Inability: Not on file  . Transportation needs    Medical: Not on file    Non-medical:  Not on file  Tobacco Use  . Smoking status: Former Smoker    Packs/day: 1.50    Years: 57.00    Pack years: 85.50    Types: Cigarettes  . Smokeless tobacco: Never Used  Substance and Sexual Activity  . Alcohol use: No    Frequency:  Never  . Drug use: Never  . Sexual activity: Not on file  Lifestyle  . Physical activity    Days per week: Not on file    Minutes per session: Not on file  . Stress: Not on file  Relationships  . Social Musician on phone: Not on file    Gets together: Not on file    Attends religious service: Not on file    Active member of club or organization: Not on file    Attends meetings of clubs or organizations: Not on file    Relationship status: Not on file  Other Topics Concern  . Not on file  Social History Narrative  . Not on file   Family History  Problem Relation Age of Onset  . Congestive Heart Failure Father        had AICD placed.   . Dementia Father   . Dementia Mother    Scheduled Meds: . atorvastatin  80 mg Oral QHS  . Chlorhexidine Gluconate Cloth  6 each Topical Q0600  . free water  75 mL Per Tube Q4H  . Melatonin  10 mg Oral QHS  . [START ON 05/30/2019] pantoprazole  40 mg Intravenous Q12H   Continuous Infusions: . DOPamine 2.5 mcg/kg/min (05/28/19 0900)  . norepinephrine (LEVOPHED) Adult infusion    . pantoprozole (PROTONIX) infusion 8 mg/hr (05/28/19 0900)  . piperacillin-tazobactam (ZOSYN)  IV Stopped (05/28/19 0641)  . vancomycin     PRN Meds:.sodium chloride (PF) Medications Prior to Admission:  Prior to Admission medications   Medication Sig Start Date End Date Taking? Authorizing Provider  acetaminophen (TYLENOL) 500 MG tablet Take 1,000 mg by mouth every 6 (six) hours as needed (headache.).   Yes [provider]  aspirin 81 MG chewable tablet Chew 1 tablet (81 mg total) by mouth daily. 03/17/19  Yes Salary, Evelena Asa, MD  atorvastatin (LIPITOR) 80 MG tablet Take 80 mg by mouth at bedtime.  01/05/19   Yes [provider]  cyanocobalamin 100 MCG tablet Take 100 mcg by mouth daily.   Yes [provider]  docusate sodium (COLACE) 100 MG capsule Take 100 mg by mouth daily.    Yes [provider]  ferrous sulfate 325 (65 FE) MG EC tablet Take 1 tablet (325 mg total) by mouth 2 (two) times daily. 05/19/19 05/18/20 Yes Sudini, Wardell Heath, MD  furosemide (LASIX) 20 MG tablet Take 0.5 tablets (10 mg total) by mouth daily. 04/12/19 07/11/19 Yes Creig Hines, NP  JARDIANCE 10 MG TABS tablet Take 10 mg by mouth daily. 05/14/19 08/12/19 Yes Pollak, Lavella Hammock, PA-C  losartan (COZAAR) 25 MG tablet Take 0.5 tablets (12.5 mg total) by mouth daily. 04/09/19 07/08/19 Yes Creig Hines, NP  Melatonin 10 MG TABS Take 10 mg by mouth at bedtime.    Yes [provider]  Multiple Vitamin (MULTIVITAMIN WITH MINERALS) TABS tablet Take 1 tablet by mouth daily. Multivitamin for Senior 50+   Yes [provider]  Omega-3 Fatty Acids (FISH OIL) 1000 MG CAPS Take 1,000 mg by mouth daily.   Yes [provider]  omeprazole (PRILOSEC) 40 MG capsule Take 1 capsule (40 mg total) by mouth 2 (two) times a day. 05/19/19 06/18/19 Yes Sudini, Wardell Heath, MD  traMADol (ULTRAM) 50 MG tablet Take 1 tablet (50 mg total) by mouth every 6 (six) hours as needed. Patient taking differently: Take 50 mg by mouth every 6 (six) hours as needed (pain).  03/16/19  Yes Salary, Avel Peace, MD   No Known Allergies Review of Systems  All other systems reviewed and are negative.   Physical Exam Abdominal:     Tenderness: There is no guarding.  Neurological:     Mental Status: He is alert.     Comments: Conversive     Vital Signs: BP (!) 135/46   Pulse (!) 50   Temp 98 F (36.7 C) (Oral)   Resp 15   Ht 5\' 9"  (1.753 m)   Wt 63.1 kg   SpO2 100%   BMI 20.54 kg/m  Pain Scale: 0-10 POSS *See Group Information*: 1-Acceptable,Awake and alert Pain Score: 0-No pain   SpO2: SpO2: 100 %  O2 Device:SpO2: 100 % O2 Flow Rate: .O2 Flow Rate (L/min): 2 L/min  IO: Intake/output summary:   Intake/Output Summary (Last 24 hours) at 05/28/2019 1301 Last data filed at 05/28/2019 0900 Gross per 24 hour  Intake 1883.25 ml  Output 2500 ml  Net -616.75 ml    LBM: Last BM Date: 05/26/19 Baseline Weight: Weight: 69.4 kg Most recent weight: Weight: 63.1 kg     Palliative Assessment/Data: NPO     Time In: 12:40 Time Out:1:30 Time Total: 50 min Greater than 50%  of this time was spent counseling and coordinating care related to the above assessment and plan.  Signed by: Asencion Gowda, NP   Please contact Palliative Medicine Team phone at [phone] for questions and concerns.  For individual provider: See Shea Evans

## 2019-05-28 NOTE — Consult Note (Signed)
[facility] Vascular Consult Note  MRN : [id number]  Kevin Case is a 78 y.o. (09-07-1941) male who presents with chief complaint of "nausea".  History of Present Illness:  The patient is a 78 year old male with a past medical history of coronary artery disease, non-ST elevation myocardial infarction, aortic stenosis, left bundle branch block, systolic heart failure, type 2 diabetes, hypertension, chronic kidney disease, carotid artery disease, peripheral artery disease, subclavian artery stenosis who was admitted to the [facility] hospitalist service with septic shock secondary to severe ischemic gastroenteritis resulting in necrotic gastric body and acute blood loss anemia.  Patient endorses a history of progressively worsening nausea and vomiting.  Patient with poor p.o. intake for quite some time as per wife. Patient was scheduled to undergo an endoscopy for possible upper GI bleeding however was diagnosed with pneumonia and the planned endoscopy was rescheduled.  The patient underwent an endoscopy yesterday which was concerning for ischemic gastritis, noted to have oozing gastric ulcers and a necrotic gastric body.  CTA A/P (05/27/19): IMPRESSION: VASCULAR  1. 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 Aortic Atherosclerosis (ICD10-170.0) 2. 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 3. 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 4. The hypertrophic inferior mesenteric artery provides collateral flow to the superior mesenteric artery. 5. Extensive peripheral arterial disease with heavily calcified atherosclerotic plaque throughout the iliac arteries with areas of mild to moderate stenosis bilaterally. 6. Incompletely imaged chronic total  occlusion of the right superficial femoral artery.  Vascular Surgery was consulted by NP Ouma for further recommendations.  Current Facility-Administered Medications  Medication Dose Route Frequency Provider Last Rate Last Dose  . atorvastatin (LIPITOR) tablet 80 mg  80 mg Oral QHS Jimmye Norman, NP   80 mg at 05/27/19 2131  . Chlorhexidine Gluconate Cloth 2 % PADS 6 each  6 each Topical Q0600 Jimmye Norman, NP   6 each at 05/27/19 2100  . DOPamine (INTROPIN) 800 mg in dextrose 5 % 250 mL (3.2 mg/mL) infusion  2.5 mcg/kg/min Intravenous Titrated Eugenie Norrie, NP 2.96 mL/hr at 05/28/19 0900 2.5 mcg/kg/min at 05/28/19 0900  . free water 75 mL  75 mL Per Tube Q4H Jimmye Norman, NP   75 mL at 05/27/19 2259  . Melatonin TABS 10 mg  10 mg Oral QHS Jimmye Norman, NP   10 mg at 05/27/19 2253  . norepinephrine (LEVOPHED)  in premix infusion  0-40 mcg/min Intravenous Titrated Eugenie Norrie, NP      . pantoprazole (PROTONIX) 80 mg in sodium chloride 0.9 % 250 mL (0.32 mg/mL) infusion  8 mg/hr Intravenous Continuous Jimmye Norman, NP 25 mL/hr at 05/28/19 0900 8 mg/hr at 05/28/19 0900  . [START ON 05/30/2019] pantoprazole (PROTONIX) injection 40 mg  40 mg Intravenous Q12H Ouma, Hubbard Hartshorn, NP      . piperacillin-tazobactam (ZOSYN) IVPB 3.375 g  3.375 g Intravenous Q8H Jimmye Norman, NP   Stopped at 05/28/19 [phone]  . sodium chloride (PF) 0.9 % injection 10 mL  10 mL Other PRN Jimmye Norman, NP   10 mL at 05/27/19 1450  . vancomycin (VANCOCIN) IVPB 1000 mg/200 mL premix  1,000 mg Intravenous Q24H Jimmye Norman, NP       Past Medical History:  Diagnosis Date  . Anemia   . Blood transfusion  without reported diagnosis   . CAD (coronary artery disease)    a. 03/2019 Cath: LM nl, LAD 50p CA2+, 53m, D1 50ost, LCX 20p, 36m, RCA small, mild diff dzs-->Med rx. Consider PCI LCX for refractory angina.  . Carotid arterial  disease (HCC)    a. 03/2019 Carotid U/S: RICA 1-39%, RECA <50%, LICA 1-39%, LECA <50%.  . CHF (congestive heart failure) (HCC)   . Clotting disorder (HCC)   . Diabetes mellitus without complication (HCC)   . GIB (gastrointestinal bleeding)    a. 03/2019 BRBPR following PV procedure req anticoagulation-->2u PRBCs.  . Heart murmur   . HFrEF (heart failure with reduced ejection fraction) (HCC)    a. 03/2019 Echo: EF 30-35%, mild conc LVH. Mildly dil LA. Mod MV annular dil. Mod AS (mean grad , Valve area 0.76).  Marland Kitchen History of tobacco abuse   . Hypertension   . Ischemic cardiomyopathy    a. 03/2019 Echo: EF 30-35%.  . Left bundle branch block   . Moderate aortic stenosis    a. 03/2019 Echo:  Mod AS (mean grad , Valve area 0.76).  Marland Kitchen PAD (peripheral artery disease) (HCC)    a. 03/2019 PV Angio: RCFA 80, RSFA 100.  . Rib fracture   . Sinus bradycardia   . Subclavian arterial stenosis (HCC)    a. 03/2019 PV Angio: RSCA 90 after origin of CCA. RCCA 30ost, LSCA 80 (PTA and covered stenting).   Past Surgical History:  Procedure Laterality Date  . AORTIC ARCH ANGIOGRAPHY N/A 03/31/2019   Procedure: AORTIC ARCH ANGIOGRAPHY;  Surgeon: Iran Ouch, MD;  Location: [facility] INVASIVE CV LAB;  Service: Cardiovascular;  Laterality: N/A;  . CARDIAC CATHETERIZATION    . ESOPHAGOGASTRODUODENOSCOPY (EGD) WITH PROPOFOL N/A 05/27/2019   Procedure: ESOPHAGOGASTRODUODENOSCOPY (EGD) WITH PROPOFOL;  Surgeon: Toney Reil, MD;  Location: [facility] ENDOSCOPY;  Service: Gastroenterology;  Laterality: N/A;  . PERIPHERAL VASCULAR INTERVENTION Left 03/31/2019   Procedure: PERIPHERAL VASCULAR INTERVENTION;  Surgeon: Iran Ouch, MD;  Location: [facility] INVASIVE CV LAB;  Service: Cardiovascular;  Laterality: Left;  . RIGHT/LEFT HEART CATH AND CORONARY ANGIOGRAPHY N/A 03/15/2019   Procedure: RIGHT/LEFT HEART CATH AND CORONARY ANGIOGRAPHY;  Surgeon: Iran Ouch, MD;  Location: [facility] INVASIVE CV LAB;  Service:  Cardiovascular;  Laterality: N/A;   Social History Social History   Tobacco Use  . Smoking status: Former Smoker    Packs/day: 1.50    Years: 57.00    Pack years: 85.50    Types: Cigarettes  . Smokeless tobacco: Never Used  Substance Use Topics  . Alcohol use: No    Frequency: Never  . Drug use: Never   Family History Family History  Problem Relation Age of Onset  . Congestive Heart Failure Father        had AICD placed.   . Dementia Father   . Dementia Mother   Denies family history of peripheral artery disease, venous disease or renal disease.  No Known Allergies   REVIEW OF SYSTEMS (Negative unless checked)  Constitutional: [x] Weight loss  [] Fever  [] Chills Cardiac: [] Chest pain   [] Chest pressure   [] Palpitations   [] Shortness of breath when laying flat   [] Shortness of breath at rest   [] Shortness of breath with exertion. Vascular:  [] Pain in legs with walking   [] Pain in legs at rest   [] Pain in legs when laying flat   [] Claudication   [] Pain in feet when walking  [] Pain in feet at rest  [] Pain in feet  when laying flat   [] History of DVT   [] Phlebitis   [] Swelling in legs   [] Varicose veins   [] Non-healing ulcers Pulmonary:   [] Uses home oxygen   [] Productive cough   [] Hemoptysis   [] Wheeze  [] COPD   [] Asthma Neurologic:  [] Dizziness  [] Blackouts   [] Seizures   [] History of stroke   [] History of TIA  [] Aphasia   [] Temporary blindness   [] Dysphagia   [] Weakness or numbness in arms   [] Weakness or numbness in legs Musculoskeletal:  [] Arthritis   [] Joint swelling   [] Joint pain   [] Low back pain Hematologic:  [] Easy bruising  [] Easy bleeding   [x] Hypercoagulable state   [] Anemic  [] Hepatitis Gastrointestinal:  [] Blood in stool   [x] Vomiting blood  [x] Gastroesophageal reflux/heartburn   [] Difficulty swallowing. Genitourinary:  [x] Chronic kidney disease   [] Difficult urination  [] Frequent urination  [] Burning with urination   [] Blood in urine Skin:  [] Rashes   [] Ulcers    [] Wounds Psychological:  [] History of anxiety   []  History of major depression.  Positive for nausea, vomiting or abdominal pain  Physical Examination  Vitals:   05/28/19 0800 05/28/19 0835 05/28/19 0900 05/28/19 0930  BP: (!) 141/56 (!) 99/44 (!) 105/44 (!) 118/44  Pulse: (!) 58 (!) 52 (!) 50 (!) 51  Resp: 17 18 17 20   Temp:  98 F (36.7 C)    TempSrc:  Oral    SpO2: 99% 100% 98% 97%  Weight:      Height:       Body mass index is 20.54 kg/m. Gen:  WD/WN, NAD Head: [place]/AT, No temporalis wasting. Prominent temp pulse not noted. Ear/Nose/Throat: Hearing grossly intact, nares w/o erythema or drainage, oropharynx w/o Erythema/Exudate Eyes: Sclera non-icteric, conjunctiva clear Neck: Trachea midline.  No JVD.  Pulmonary:  Good air movement, respirations not labored, equal bilaterally.  Cardiac: RRR, normal S1, S2. Vascular:  Vessel Right Left  Radial Palpable Palpable  Ulnar Palpable Palpable  Brachial Palpable Palpable  Carotid Palpable, without bruit Palpable, without bruit  Aorta Not palpable N/A  Femoral Palpable Palpable  Popliteal Palpable Palpable  PT Non-Palpable Non-Palpable  DP Non-Palpable Non-Palpable   Gastrointestinal: Doboff tube intact. Soft, non-tender/non-distended. No guarding/reflex.  Musculoskeletal: M/S 5/5 throughout. Minimal edema. Neurologic: Sensation grossly intact in extremities.  Symmetrical.  Speech is fluent. Motor exam as listed above. Psychiatric: Judgment intact, Mood & affect appropriate for pt's clinical situation. Dermatologic: No rashes or ulcers noted.  No cellulitis or open wounds. Lymph : No Cervical, Axillary, or Inguinal lymphadenopathy.  CBC Lab Results  Component Value Date   WBC 9.8 05/28/2019   HGB 10.2 (L) 05/28/2019   HCT 33.1 (L) 05/28/2019   MCV 86.8 05/28/2019   PLT 204 05/28/2019   BMET    Component Value Date/Time   NA 138 05/28/2019 0153   NA 138 05/04/2019 1528   K 3.8 05/28/2019 0153   CL 111 05/28/2019  0153   CO2 22 05/28/2019 0153   GLUCOSE 151 (H) 05/28/2019 0153   BUN 20 05/28/2019 0153   BUN 26 05/04/2019 1528   CREATININE 1.35 (H) 05/28/2019 0153   CALCIUM 8.0 (L) 05/28/2019 0153   GFRNONAA 51 (L) 05/28/2019 0153   GFRAA 59 (L) 05/28/2019 0153   Estimated Creatinine Clearance: 42.2 mL/min (A) (by C-G formula based on SCr of 1.35 mg/dL (H)).  COAG Lab Results  Component Value Date   INR 1.2 05/16/2019   INR 1.1 03/12/2019   Radiology Dg Chest 2 View  Result Date:  05/17/2019 CLINICAL DATA:  Vomiting blood.  Weakness, leukocytosis. EXAM: CHEST - 2 VIEW COMPARISON:  03/31/2019 FINDINGS: Heart is normal size. Patchy airspace opacities in the lower lobes bilaterally. No effusions. No acute bony abnormality. Old left rib fractures noted. IMPRESSION: Patchy bilateral lower lobe airspace opacities which could reflect multifocal pneumonia. Electronically Signed   By: Charlett NoseKevin  Dover M.D.   On: 05/17/2019 19:08   Dg Vangie BickerNaso G Tube Plc W/fl W/rad  Result Date: 05/27/2019 CLINICAL DATA:  The off tube placement EXAM: NASO G TUBE PLACEMENT WITH FL AND WITH RAD CONTRAST:  20 mL of Omnipaque 300 FLUOROSCOPY TIME:  Fluoroscopy Time:  4 minutes and 18 seconds Number of Acquired Spot Images: 0 COMPARISON:  None. FINDINGS: A Dobbhoff tube was placed through the left nostril and advanced through the stomach into the distal duodenum. The distal tip is near the ligament of Treitz. IMPRESSION: Appropriate Dobbhoff tube placement as above. Electronically Signed   By: Gerome Samavid  Williams III M.D   On: 05/27/2019 14:57   Ct Angio Abd/pel W/ And/or W/o  Result Date: 05/27/2019 CLINICAL DATA:  78 year old male with infectious gastroenteritis/colitis. Evaluate for mesenteric ischemia. EXAM: CT ANGIOGRAPHY ABDOMEN AND PELVIS WITH CONTRAST AND WITHOUT CONTRAST TECHNIQUE: Multidetector CT imaging of the abdomen and pelvis was performed using the standard protocol during bolus administration of intravenous contrast.  Multiplanar reconstructed images and MIPs were obtained and reviewed to evaluate the vascular anatomy. CONTRAST:  100mL ISOVUE-370 IOPAMIDOL (ISOVUE-370) INJECTION 76% COMPARISON:  None. FINDINGS: VASCULAR Aorta: Extensive calcified atherosclerotic plaque throughout the abdominal aorta. No evidence of aneurysm or dissection. There is bulky [place] reef like plaque in the visceral aorta resulting in approximately 40% luminal loss. Celiac: Heavily calcified atherosclerotic plaque results in high-grade stenosis at the origin of the celiac artery. SMA: Heavily calcified atherosclerotic plaque with an additional fibrofatty component results in high-grade stenosis and probable occlusion of the proximal superior mesenteric artery. Renals: Heavy calcified atherosclerotic plaque along both renal arteries without evidence of significant stenosis. No changes of fibromuscular dysplasia. IMA: Hypertrophic inferior mesenteric artery provides the dominant blood supply to the superior mesenteric artery. Inflow: Bulky calcified atherosclerotic plaque bilaterally. Likely multifocal moderate to advanced stenosis throughout the iliac systems. Proximal Outflow: Bulky [place]al reef like atherosclerotic plaque in the right common femoral artery results in a greater than 60% diameter stenosis. Fairly minimal plaque in the left common femoral artery. Heavily diseased superficial femoral arteries bilaterally with chronic occlusion of the right SFA. Veins: No focal venous abnormality or evidence of thrombus. Review of the MIP images confirms the above findings. NON-VASCULAR Lower chest: Dependent atelectasis in the lower lobes. Patchy areas of tree-in-bud micro nodularity in a peribronchovascular distribution in the dependent portions of the lower lungs. The heart is normal in size. Unremarkable distal thoracic esophagus. Hepatobiliary: Normal hepatic contour and morphology. No discrete hepatic lesions. Normal appearance of the gallbladder. No  intra or extrahepatic biliary ductal dilatation. Pancreas: Unremarkable. No pancreatic ductal dilatation or surrounding inflammatory changes. Spleen: Normal in size without focal abnormality. Adrenals/Urinary Tract: Adrenal glands are unremarkable. Kidneys are normal, without renal calculi, focal lesion, or hydronephrosis. Bladder is unremarkable. Stomach/Bowel: No focal bowel wall thickening or evidence of obstruction. Lymphatic: No suspicious lymphadenopathy. Reproductive: Prostatomegaly. Other: No abdominal wall hernia or abnormality. No abdominopelvic ascites. Musculoskeletal: No acute fracture or aggressive appearing lytic or blastic osseous lesion. IMPRESSION: VASCULAR 1. 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 Aortic Atherosclerosis (ICD10-170.0) 2. High-grade stenosis of the origin of  the celiac artery secondary to bulky calcified atherosclerotic plaque. 3. 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 4. The hypertrophic inferior mesenteric artery provides collateral flow to the superior mesenteric artery. 5. Extensive peripheral arterial disease with heavily calcified atherosclerotic plaque throughout the iliac arteries with areas of mild to moderate stenosis bilaterally. 6. Incompletely imaged chronic total occlusion of the right superficial femoral artery. NON-VASCULAR 1. No focal bowel wall thickening or evidence of infection/inflammation at this time. 2. Nonspecific tree-in-bud micro nodularity in the dependent portions of the visualized lower lungs may represent chronic or subclinical aspiration. 3. Prostatomegaly. Signed, Criselda Peaches, MD, [place] [facility] Radiology Electronically Signed   By: Jacqulynn Cadet M.D.   On: 05/27/2019 14:52   Assessment/Plan The patient is a 78 year old male with multiple  medical issues including a recent diagnosis of ischemic gastroenteritis with peptic ulcer disease status post endoscopy admitted to the ICU for hypotension 1.  Mesenteric ischemia: CTA of the abdomen and pelvis was notable for extensive predominantly calcified aortic and peripheral vascular disease affecting numerous vascular beds.  Patient was also found to have severe mesenteric atherosclerotic disease.  Patient with progressively worsening abdominal pain, nausea and vomiting.  Patient has experienced weight loss.  Patient with severe ischemic gastroenteritis with bleeding peptic ulcer disease.  Even though the patient is on pressors being observed in the stepdown unit his hemoglobin is stable and would recommend undergoing a mesenteric angiogram with possible intervention and attempt to assess the patient's anatomy and restore blood flow to the patient's gut.  I personally spoke to the patient's wife Donita via the phone who is the patient's power of attorney.  Procedure, risks and benefits were explained to the patient's wife.  All questions were answered.  The patient's wife understands the patient's guarded status however would like [place] to proceed and attempt to revascularize the mesentery.  Patient's wife repeated numerous times to please do everything to save her husband's life.  The patient has an occluded right femoral artery and will attempt access from left. 2.  Peripheral artery disease: Patient with severe atherosclerotic disease affecting numerous vascular beds.  At this time the patient is not complaining of any rest pain or ulcer formation to the bilateral legs.  Once the patient is stable we will be happy to see him in the outpatient setting.  3.  Carotid artery stenosis: This is followed by Dr. Fletcher Anon.  Most recent vascular ultrasound was 1 to 39% stenosis bilaterally.  Patient denies any TIA-like symptoms or amaurosis fugax at this time. 4.  Subclavian stenosis: This is also followed by Dr.  Fletcher Anon.  The patient denies any upper extremity pain or discomfort at this time.  Discussed with Dr. Mayme Genta, PA-C  05/28/2019 11:59 AM  This note was created with Dragon medical transcription system.  Any error is purely unintentional

## 2019-05-28 NOTE — Progress Notes (Signed)
Patient ID: Kevin Case, male   DOB: 1941-04-16, 78 y.o.   MRN: [id number] patient is down in the vascular lab for mesenteric angiogram.  Presented from endoscopy suite after EGD done for iron deficiency anemia showed severe bleeding/losing necrotic gastric ulcers. Patient currently on Protonix drip. CT angiogram of the abdomen shows high-grade stenosis iliac artery. He is getting angiogram per Dr. Lucky Cowboy. Continue IV Protonix drip. Transfuse as needed. Hemoglobin stable at 10.1 currently on IV dopamine for hypotensive/hypothalamic shock.

## 2019-05-28 NOTE — Progress Notes (Signed)
Pharmacy Antibiotic Note  Kevin Case is a 78 y.o. male admitted on 05/27/2019 with ischemic gastroenteritis.  Pharmacy has been consulted for Vancomycin and Zosyn dosing.  Plan: Continue vancomycin 1000 mg IV Q 24 hrs. Goal AUC 400-550. Expected AUC: 523.5 SCr used: 1.38  Continue Zosyn 3.375g IV q8h extended infusion   Height: 5\' 9"  (175.3 cm) Weight: 139 lb 1.8 oz (63.1 kg) IBW/kg (Calculated) : 70.7  Temp (24hrs), Avg:98.3 F (36.8 C), Min:98 F (36.7 C), Max:98.6 F (37 C)  Recent Labs  Lab 05/27/19 1007 05/27/19 1215 05/27/19 1958 05/28/19 0153  WBC 13.2*  --  7.3 9.8  CREATININE  --  1.38*  --  1.35*  LATICACIDVEN  --   --   --  0.8    Estimated Creatinine Clearance: 42.2 mL/min (A) (by C-G formula based on SCr of 1.35 mg/dL (H)).    No Known Allergies  Antimicrobials this admission: Vancomycin 7/23 >>  Zosyn 7/23 >>   Microbiology: 7/23 Blood cx: NGTD 7/23 Urine cx: pending 7/23 MRSA PCR: negative 7/24 SARS Coronavirus 2 negative  Thank you for allowing pharmacy to be a part of this patient's care.  Dorena Bodo, PharmD Clinical Pharmacist 05/28/2019 12:49 PM

## 2019-05-28 NOTE — Telephone Encounter (Signed)
Spoke and explained plan for today per my progress note

## 2019-05-28 NOTE — Progress Notes (Signed)
Wyline Mood , MD [address], [address], [place], [place], [address] [address], [place], [place], [address] Phone: [phone]  Fax: [phone]   Kevin Case is being followed for ischemia to stomach with necrosis  Day 1 of follow up   Subjective: He feels well.  He denies any abdominal pain.  He says he really wants to eat.  He actually was thumping on his belly to show that he was feeling well.   Objective: Vital signs in last 24 hours: Vitals:   05/28/19 0800 05/28/19 0835 05/28/19 0900 05/28/19 0930  BP: (!) 141/56 (!) 99/44 (!) 105/44 (!) 118/44  Pulse: (!) 58 (!) 52 (!) 50 (!) 51  Resp: 17 18 17 20   Temp:  98 F (36.7 C)    TempSrc:  Oral    SpO2: 99% 100% 98% 97%  Weight:      Height:       Weight change:   Intake/Output Summary (Last 24 hours) at 05/28/2019 1018 Last data filed at 05/28/2019 0900 Gross per 24 hour  Intake 1883.25 ml  Output 2500 ml  Net -616.75 ml     Exam: Heart:: Regular rate and rhythm, S1S2 present or without murmur or extra heart sounds Lungs: normal, clear to auscultation and clear to auscultation and percussion Abdomen: soft, nontender, normal bowel sounds   Lab Results: @LABTEST2 @ Micro Results: Recent Results (from the past 240 hour(s))  SARS Coronavirus 2 (Performed in [facility] hospital lab)     Status: None   Collection Time: 05/24/19 11:39 AM   Specimen: Nasal Swab  Result Value Ref Range Status   SARS Coronavirus 2 NEGATIVE NEGATIVE Final    Comment: (NOTE) SARS-CoV-2 target nucleic acids are NOT DETECTED. The SARS-CoV-2 RNA is generally detectable in upper and lower respiratory specimens during the acute phase of infection. Negative results do not preclude SARS-CoV-2 infection, do not rule out co-infections with other pathogens, and should not be used as the sole basis for treatment or other patient management decisions. Negative results must be combined with clinical observations, patient  history, and epidemiological information. The expected result is Negative. Fact Sheet for Patients: HairSlick.no Fact Sheet for Healthcare Providers: quierodirigir.com This test is not yet approved or cleared by the [place] FDA and  has been authorized for detection and/or diagnosis of SARS-CoV-2 by FDA under an Emergency Use Authorization (EUA). This EUA will remain  in effect (meaning this test can be used) for the duration of the COVID-19 declaration under Section 56 4(b)(1) of the Act, 21 U.S.C. section 360bbb-3(b)(1), unless the authorization is terminated or revoked sooner. Performed at [facility], [address] [place], [place] [address]   CULTURE, BLOOD (ROUTINE X 2) w Reflex to ID Panel     Status: None (Preliminary result)   Collection Time: 05/27/19 12:15 PM   Specimen: BLOOD  Result Value Ref Range Status   Specimen Description BLOOD RIGHT ANTECUBITAL  Final   Special Requests   Final    BOTTLES DRAWN AEROBIC AND ANAEROBIC Blood Culture adequate volume   Culture   Final    NO GROWTH < 24 HOURS Performed at [facility], [address] [place], [place] [address]    Report Status PENDING  Incomplete  CULTURE, BLOOD (ROUTINE X 2) w Reflex to ID Panel     Status: None (Preliminary result)   Collection Time: 05/27/19 12:15 PM   Specimen: BLOOD  Result Value Ref Range Status  Specimen Description BLOOD BLOOD LEFT ARM  Final   Special Requests   Final    BOTTLES DRAWN AEROBIC AND ANAEROBIC Blood Culture adequate volume   Culture   Final    NO GROWTH < 24 HOURS Performed at [facility], [address] [place], [place] [address]    Report Status PENDING  Incomplete  MRSA PCR Screening     Status: None   Collection Time: 05/27/19  9:03 PM   Specimen: Nasal Mucosa; Nasopharyngeal  Result Value Ref Range Status   MRSA by PCR NEGATIVE NEGATIVE Final    Comment:        The  GeneXpert MRSA Assay (FDA approved for NASAL specimens only), is one component of a comprehensive MRSA colonization surveillance program. It is not intended to diagnose MRSA infection nor to guide or monitor treatment for MRSA infections. Performed at [facility], [address] [place], [place] [address]   SARS Coronavirus 2 (CEPHEID - Performed in [facility] hospital lab), Hosp Order     Status: None   Collection Time: 05/28/19  2:16 AM   Specimen: Nasopharyngeal Swab  Result Value Ref Range Status   SARS Coronavirus 2 NEGATIVE NEGATIVE Final    Comment: (NOTE) If result is NEGATIVE SARS-CoV-2 target nucleic acids are NOT DETECTED. The SARS-CoV-2 RNA is generally detectable in upper and lower  respiratory specimens during the acute phase of infection. The lowest  concentration of SARS-CoV-2 viral copies this assay can detect is 250  copies / mL. A negative result does not preclude SARS-CoV-2 infection  and should not be used as the sole basis for treatment or other  patient management decisions.  A negative result may occur with  improper specimen collection / handling, submission of specimen other  than nasopharyngeal swab, presence of viral mutation(s) within the  areas targeted by this assay, and inadequate number of viral copies  (<250 copies / mL). A negative result must be combined with clinical  observations, patient history, and epidemiological information. If result is POSITIVE SARS-CoV-2 target nucleic acids are DETECTED. The SARS-CoV-2 RNA is generally detectable in upper and lower  respiratory specimens dur ing the acute phase of infection.  Positive  results are indicative of active infection with SARS-CoV-2.  Clinical  correlation with patient history and other diagnostic information is  necessary to determine patient infection status.  Positive results do  not rule out bacterial infection or co-infection with other viruses. If result is PRESUMPTIVE  POSTIVE SARS-CoV-2 nucleic acids MAY BE PRESENT.   A presumptive positive result was obtained on the submitted specimen  and confirmed on repeat testing.  While 2019 novel coronavirus  (SARS-CoV-2) nucleic acids may be present in the submitted sample  additional confirmatory testing may be necessary for epidemiological  and / or clinical management purposes  to differentiate between  SARS-CoV-2 and other Sarbecovirus currently known to infect humans.  If clinically indicated additional testing with an alternate test  methodology [phone]([id number]) is advised. The SARS-CoV-2 RNA is generally  detectable in upper and lower respiratory sp ecimens during the acute  phase of infection. The expected result is Negative. Fact Sheet for Patients:  BoilerBrush.com.cyhttps://www.fda.gov/media/136312/download Fact Sheet for Healthcare Providers: https://pope.com/https://www.fda.gov/media/136313/download This test is not yet approved or cleared by the [place] FDA and has been authorized for detection and/or diagnosis of SARS-CoV-2 by FDA under an Emergency Use Authorization (EUA).  This EUA will remain in effect (meaning this test can be used) for the duration of the COVID-19  declaration under Section 564(b)(1) of the Act, 21 U.S.C. section 360bbb-3(b)(1), unless the authorization is terminated or revoked sooner. Performed at [facility], [address] [place], [place] [address]    Studies/Results: Dg Loyce Dys Tube Plc W/fl W/rad  Result Date: 05/27/2019 CLINICAL DATA:  The off tube placement EXAM: NASO G TUBE PLACEMENT WITH FL AND WITH RAD CONTRAST:  20 mL of Omnipaque 300 FLUOROSCOPY TIME:  Fluoroscopy Time:  4 minutes and 18 seconds Number of Acquired Spot Images: 0 COMPARISON:  None. FINDINGS: A Dobbhoff tube was placed through the left nostril and advanced through the stomach into the distal duodenum. The distal tip is near the ligament of Treitz. IMPRESSION: Appropriate Dobbhoff tube placement as above. Electronically  Signed   By: Dorise Bullion III M.D   On: 05/27/2019 14:57   Ct Angio Abd/pel W/ And/or W/o  Result Date: 05/27/2019 CLINICAL DATA:  78 year old male with infectious gastroenteritis/colitis. Evaluate for mesenteric ischemia. EXAM: CT ANGIOGRAPHY ABDOMEN AND PELVIS WITH CONTRAST AND WITHOUT CONTRAST TECHNIQUE: Multidetector CT imaging of the abdomen and pelvis was performed using the standard protocol during bolus administration of intravenous contrast. Multiplanar reconstructed images and MIPs were obtained and reviewed to evaluate the vascular anatomy. CONTRAST:  189mL ISOVUE-370 IOPAMIDOL (ISOVUE-370) INJECTION 76% COMPARISON:  None. FINDINGS: VASCULAR Aorta: Extensive calcified atherosclerotic plaque throughout the abdominal aorta. No evidence of aneurysm or dissection. There is bulky [place] reef like plaque in the visceral aorta resulting in approximately 40% luminal loss. Celiac: Heavily calcified atherosclerotic plaque results in high-grade stenosis at the origin of the celiac artery. SMA: Heavily calcified atherosclerotic plaque with an additional fibrofatty component results in high-grade stenosis and probable occlusion of the proximal superior mesenteric artery. Renals: Heavy calcified atherosclerotic plaque along both renal arteries without evidence of significant stenosis. No changes of fibromuscular dysplasia. IMA: Hypertrophic inferior mesenteric artery provides the dominant blood supply to the superior mesenteric artery. Inflow: Bulky calcified atherosclerotic plaque bilaterally. Likely multifocal moderate to advanced stenosis throughout the iliac systems. Proximal Outflow: Bulky [place] reef like atherosclerotic plaque in the right common femoral artery results in a greater than 60% diameter stenosis. Fairly minimal plaque in the left common femoral artery. Heavily diseased superficial femoral arteries bilaterally with chronic occlusion of the right SFA. Veins: No focal venous abnormality or  evidence of thrombus. Review of the MIP images confirms the above findings. NON-VASCULAR Lower chest: Dependent atelectasis in the lower lobes. Patchy areas of tree-in-bud micro nodularity in a peribronchovascular distribution in the dependent portions of the lower lungs. The heart is normal in size. Unremarkable distal thoracic esophagus. Hepatobiliary: Normal hepatic contour and morphology. No discrete hepatic lesions. Normal appearance of the gallbladder. No intra or extrahepatic biliary ductal dilatation. Pancreas: Unremarkable. No pancreatic ductal dilatation or surrounding inflammatory changes. Spleen: Normal in size without focal abnormality. Adrenals/Urinary Tract: Adrenal glands are unremarkable. Kidneys are normal, without renal calculi, focal lesion, or hydronephrosis. Bladder is unremarkable. Stomach/Bowel: No focal bowel wall thickening or evidence of obstruction. Lymphatic: No suspicious lymphadenopathy. Reproductive: Prostatomegaly. Other: No abdominal wall hernia or abnormality. No abdominopelvic ascites. Musculoskeletal: No acute fracture or aggressive appearing lytic or blastic osseous lesion. IMPRESSION: VASCULAR 1. 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 Aortic Atherosclerosis (ICD10-170.0) 2. 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 3. 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 4. The hypertrophic inferior mesenteric artery provides collateral flow to  the superior mesenteric artery. 5. Extensive peripheral arterial disease with heavily calcified atherosclerotic plaque throughout the iliac arteries with areas of mild to moderate stenosis bilaterally. 6. Incompletely imaged chronic total occlusion of the right superficial femoral artery. NON-VASCULAR 1. No focal bowel wall thickening or evidence  of infection/inflammation at this time. 2. Nonspecific tree-in-bud micro nodularity in the dependent portions of the visualized lower lungs may represent chronic or subclinical aspiration. 3. Prostatomegaly. Signed, Sterling BigHeath K. McCullough, MD, RPVI [facility] Electronically Signed   By: Malachy MoanHeath  McCullough M.D.   On: 05/27/2019 14:52   Medications: I have reviewed the patient's current medications. Scheduled Meds:  atorvastatin  80 mg Oral QHS   Chlorhexidine Gluconate Cloth  6 each Topical Q0600   free water  75 mL Per Tube Q4H   Melatonin  10 mg Oral QHS   [START ON 05/30/2019] pantoprazole  40 mg Intravenous Q12H   Continuous Infusions:  DOPamine 2.5 mcg/kg/min (05/28/19 0900)   norepinephrine (LEVOPHED) Adult infusion     pantoprozole (PROTONIX) infusion 8 mg/hr (05/28/19 0900)   piperacillin-tazobactam (ZOSYN)  IV Stopped (05/28/19 0641)   vancomycin     PRN Meds:.sodium chloride (PF)   Assessment: Active Problems:   PUD (peptic ulcer disease)   Ischemic gastroenteritis (HCC)  Kevin Case 78 y.o. male admitted on 05/27/2019 after her upper endoscopy.  The endoscopy was performed for iron deficiency anemia due to suspected upper GI bleed.  I was actually present during the procedure.  Noted severe ulceration with sloughed off material present in the lesser curvature with pale ischemic appearing mucosa in isolated areas.  There was isolated areas where there was some oozing of blood which was controlled by epinephrine injection.  The features are all suggestive of ischemic damage/necrosis of that area of the stomach.  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  Extensive peripheral arterial disease.  He has a history of CADon dopamine.   Plan: 1.   Supportive care with IV fluids, antibiotics, PPI 2.  Seen and evaluated by vascular surgery and plan is for a mesenteric angiogram and him to reestablish blood flow. 3.  No active GI endoscopy interventions at this point of time.  If she were to improve from this episode as an outpatient in 6 to 8  weeks time she would require an upper endoscopy to confirm healing of the affected area.  I called and spoke to his wife and explained plan per vascular surgery for this afternoon.  She has no further questions.  LOS: 1 day   Wyline MoodKiran Gusta Marksberry, MD 05/28/2019, 10:18 AM

## 2019-05-28 NOTE — Progress Notes (Signed)
Updated patient's wife via phone about pateint's condition several times during shift and patient spoke with his wife via phone as well. Post procedure site level 0. Sent chats to Dr. Lucky Cowboy, Dr. Vicente Males, and Dr. Lanney Gins about potential for restarted tube feedings into post pyloric tube post procedure and all doctors agreed it was okay to start. Consult to dieticians ordered for tube feedings. Message sent to Dr. Lucky Cowboy for Dr. Lanney Gins about potential for starting patient on heparin and Dr. Lucky Cowboy stated that due to gastric bleeding this admission he would recommend waiting until tomorrow to start heparin and not giving a bolus. Dr. Lanney Gins also okayed patient to get ice chips.

## 2019-05-28 NOTE — Telephone Encounter (Signed)
Patient's wife Donita called & stated patient was to have surgery on Monday & wants to talk to DR Vanga. She is very anxious. Patient is currently an inpt.

## 2019-05-28 NOTE — Progress Notes (Signed)
78 year old male admitted for ischemic gastritis was transferred yesterday from the floor to the ICU due to hypotensive episodes and bradycardia.  He has been placed on dopamine.  Interventional perform an angiogram and angioplasty and stent of the SMA. He is hemodynamics have been reasonable.  He denies any abdominal pain.  PE NAD Abd: soft, Nt, no peritonitis  A/p chronic mesenteric ischemia. Patient is debilitated and very poor surgical candidate I would not think that he will survive a major laparotomy for small bowel resection more importantly all this changes are chronic and there is no evidence of complete necrosis.  No evidence of free air. We will be on standby and await recommendations from vascular surgery. I do think that palliative care is also involved. We will be available in case any surgical needs arise Please note that I spent 35 minutes in this encounter with greater than 50% spent in coordination and counseling of his care

## 2019-05-28 NOTE — Telephone Encounter (Signed)
Murray Hodgkins messaged Dr. Vicente Males.  Please let the patient know that I am not the doctor on-call covering the hospital.  Dr. Vicente Males would be talking to her  Thanks RV

## 2019-05-28 NOTE — Op Note (Signed)
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 Ultrasound guidance for vascular access left femoral artery 2. Catheter placement into SMA from left femoral approach 3. Aortogram and selective SMA angiogram 4. Balloon expandable stent placement to the superior mesenteric artery using a 6 mm diameter x 26 mm length lifestream covered balloon expandable stent postdilated with a 7 mm balloon after predilatation with 3, 4, and 5 mm balloons 5. StarClose closure device left femoral artery  Anesthesia: local with moderate conscious sedation for approximately 60 minutes using 2 mg of Versed and 50 Mcg of Fentanyl  Fluoro Time: 13.9 minutes  Contrast: 100 cc  Indications: Patient is a critically ill gentleman with over 100 pounds of weight loss, abdominal pain, and ischemic gastritis seen on endoscopy who is critically ill in the ICU on pressors and has obvious severe chronic mesenteric ischemia.  CT scan showed occlusion of the SMA and what was read a stenosis of the celiac artery although I felt it was likely occluded.  Angiogram is performed to evaluate the lesion more thoroughly and potentially allow treatment. Risks and benefits were discussed and informed consent was obtained  Procedure: The patient was identified and appropriate procedural time out was performed. The patient was then placed supine on the table and prepped and draped in the usual sterile fashion.Moderate conscious sedation was administered during a face to face encounter with the patient with the RN monitoring their vital signs,  mental status, telemetry and pulse oximetry throughout the procedure.  Ultrasound was used to evaluate the left common femoral artery. It was patent but heavily diseased. A digital ultrasound image was acquired. A Seldinger needle was used to access the left common femoral artery under direct ultrasound guidance and a permanent image was performed. A 0.035 J wire was advanced without resistance and a 5Fr sheath was placed. Pigtail catheter was then placed into the aorta and initially an AP aortogram was performed which showed xtremely calcified aorta and iliac arteries with some disease in the iliac arteries.  The left renal artery stenosis appeared to have at least a mild to moderate stenosis.  The right renal artery was widely patent.  There was a large meandering mesenteric from an IMA which was much larger than normal.  There was no SMA or celiac flow on AP or lateral aortogram. Lateral projection view was performed which showed occlusion of the celiac artery as well as the SMA although there was a short stump of the SMA that was patent before occlusion.  I then selective cannulated the SMA with a C2 catheter. Selective imaging of the superior mesenteric artery demonstrated the occlusion of the SMA about three quarters of a centimeter beyond the origin with poor distal reconstitution. The patient was then given 4000 units of intravenous heparin and I exchanged for a 6 French sheath to perform treatment. I was able to navigate across the lesion without difficulty with a Glidewire but could not get a catheter to cross the lesion including a seeker and a 4 [place] glide catheter.  I then used the C2 catheter and selectively cannulated the SMA and cross the lesion with a 0.018 advantage wire.  Over the 0.018 advantage wire, I was able to pass a 3 mm diameter balloon.  The initial  balloon burst on inflation due to the calcification but a second balloon was able to be brought onto the field and cross the lesion in  the SMA to begin predilatation.  This was inflated to 16 atm for 1 minute.  We then exchanged for a Magic torque wire and placed an Ansell sheath to the origin of the SMA.  A 4 mm diameter high-pressure angioplasty balloon was then used to predilate the lesion followed by 5 mm diameter high-pressure angioplasty balloon.  Following each of these angioplasty inflations completion imaging was performed showing high-grade residual stenosis of the proximal SMA.  At this point, a 6 mm diameter by 26 mm length lifestream stent was brought onto the field and taken across the lesion in the proximal SMA going back about 3 to 4 mm into the aorta.  This was inflated to 12 atm with some residual waist within the stent.  As such, this was postdilated with a 7 mm diameter high-pressure angioplasty balloon inflated to 20 atm with less than 20% residual stenosis and brisk flow through the SMA.  I elected to terminate the procedure. The guide catheter was removed and oblique arteriogram was performed of the left femoral artery. StarClose closure device was deployed in the usual fashion with excellent hemostatic result.  Findings:  Aortogram:Extremely calcified aorta and iliac arteries with some disease in the iliac arteries.  The left renal artery stenosis appeared to have at least a mild to moderate stenosis.  The right renal artery was widely patent.  There was a large meandering mesenteric from an IMA which was much larger than normal.  There was no SMA or celiac flow on AP or lateral aortogram OVZ:CHYIFOYDX about three quarters of a centimeter beyond its origin with poor reconstitution distally.     Disposition: Patient was taken to the recovery room in stable condition having tolerated the procedure well.   Leotis Pain 05/28/2019 3:06 PM   This note was created with Dragon Medical transcription system. Any errors in dictation are purely unintentional.

## 2019-05-28 NOTE — Progress Notes (Addendum)
Patient ID: Rithy Mandley, male   DOB: 19-Mar-1941, 78 y.o.   MRN: [id number]  Brief HPI: 78 y.o. male pertinent past medical history ofCAD, ischemic cardiomyopathy, hypertension, tobacco abuse, HFREF, diabetes mellitus without complication, PAD, subclavian stenosis, PUD, and GIB presenting as direct admit from endoscopy suite with ischemic gastritis status post EGD. Noted severe ulceration with sloughed off material present in the lesser curvature with pale ischemic appearing mucosa in isolated areas.  There was isolated areas where there was some oozing of blood which was controlled by epinephrine injection. Features consistent with ischemic damage/necrosis of that area of the stomach.  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 Overall is noted to be very weak and lethargic.  Still on IV dopamine for hypotension.  Continues with Protonix drip, ABX coverage with Vanco and Zosyn.

## 2019-05-29 LAB — CBC
HCT: 35.5 % — ABNORMAL LOW (ref 39.0–52.0)
Hemoglobin: 10.8 g/dL — ABNORMAL LOW (ref 13.0–17.0)
MCH: 26.9 pg (ref 26.0–34.0)
MCHC: 30.4 g/dL (ref 30.0–36.0)
MCV: 88.3 fL (ref 80.0–100.0)
Platelets: 225 10*3/uL (ref 150–400)
RBC: 4.02 MIL/uL — ABNORMAL LOW (ref 4.22–5.81)
RDW: 24.4 % — ABNORMAL HIGH (ref 11.5–15.5)
WBC: 11.5 10*3/uL — ABNORMAL HIGH (ref 4.0–10.5)
nRBC: 0 % (ref 0.0–0.2)

## 2019-05-29 LAB — COMPREHENSIVE METABOLIC PANEL
ALT: 43 U/L (ref 0–44)
AST: 53 U/L — ABNORMAL HIGH (ref 15–41)
Albumin: 2.8 g/dL — ABNORMAL LOW (ref 3.5–5.0)
Alkaline Phosphatase: 80 U/L (ref 38–126)
Anion gap: 9 (ref 5–15)
BUN: 20 mg/dL (ref 8–23)
CO2: 20 mmol/L — ABNORMAL LOW (ref 22–32)
Calcium: 8.1 mg/dL — ABNORMAL LOW (ref 8.9–10.3)
Chloride: 110 mmol/L (ref 98–111)
Creatinine, Ser: 1.27 mg/dL — ABNORMAL HIGH (ref 0.61–1.24)
GFR calc Af Amer: >60 mL/min (ref >60)
GFR calc non Af Amer: 55 mL/min — ABNORMAL LOW (ref >60)
Glucose, Bld: 133 mg/dL — ABNORMAL HIGH (ref 70–99)
Potassium: 3.9 mmol/L (ref 3.5–5.1)
Sodium: 139 mmol/L (ref 135–145)
Total Bilirubin: 1 mg/dL (ref 0.3–1.2)
Total Protein: 6.5 g/dL (ref 6.5–8.1)

## 2019-05-29 LAB — URINE CULTURE: Culture: <10000 — AB

## 2019-05-29 LAB — PROCALCITONIN: Procalcitonin: <0.1 ng/mL

## 2019-05-29 LAB — HEMOGLOBIN AND HEMATOCRIT, BLOOD
HCT: 32.9 % — ABNORMAL LOW (ref 39.0–52.0)
Hemoglobin: 10.1 g/dL — ABNORMAL LOW (ref 13.0–17.0)

## 2019-05-29 LAB — APTT: aPTT: 32 seconds (ref 24–36)

## 2019-05-29 LAB — PROTIME-INR
INR: 1.3 — ABNORMAL HIGH (ref 0.8–1.2)
Prothrombin Time: 15.8 seconds — ABNORMAL HIGH (ref 11.4–15.2)

## 2019-05-29 LAB — PHOSPHORUS: Phosphorus: 3.5 mg/dL (ref 2.5–4.6)

## 2019-05-29 LAB — MAGNESIUM: Magnesium: 2.2 mg/dL (ref 1.7–2.4)

## 2019-05-29 MED ORDER — SODIUM CHLORIDE 0.9 % IV SOLN
INTRAVENOUS | Status: DC | PRN
  Administered 2019-05-29: 1000 mL via INTRAVENOUS

## 2019-05-29 MED ORDER — OSMOLITE 1.5 CAL PO LIQD
1000.0000 mL | ORAL | Status: DC
  Administered 2019-05-29 – 2019-05-30 (×2): 1000 mL

## 2019-05-29 MED ORDER — PRO-STAT SUGAR FREE PO LIQD
30.0000 mL | Freq: Every day | ORAL | Status: DC
  Administered 2019-05-29 – 2019-05-30 (×2): 30 mL

## 2019-05-29 MED ORDER — NOREPINEPHRINE BITARTRATE 1 MG/ML IV SOLN
0.0000 ug/min | INTRAVENOUS | Status: DC
  Administered 2019-05-29: 01:00:00 5 ug/min via INTRAVENOUS
  Administered 2019-05-30: 02:00:00 2 ug/min via INTRAVENOUS
  Filled 2019-05-29 (×3): qty 4

## 2019-05-29 MED ORDER — HEPARIN (PORCINE) 25000 UT/250ML-% IV SOLN
1050.0000 [IU]/h | INTRAVENOUS | Status: DC
  Administered 2019-05-29: 750 [IU]/h via INTRAVENOUS
  Administered 2019-05-30: 1000 [IU]/h via INTRAVENOUS
  Administered 2019-05-30 (×2): 900 [IU]/h via INTRAVENOUS
  Administered 2019-05-31: 1050 [IU]/h via INTRAVENOUS
  Filled 2019-05-29 (×3): qty 250

## 2019-05-29 NOTE — Plan of Care (Signed)

## 2019-05-29 NOTE — Progress Notes (Signed)
Brief Nutrition Note  Consult received for enteral/tube feeding initiation and management.  RD to order tube feeding recommendations left by RD who completed initial nutrition assessment on 05/27/19:  - Initiate Osmolite 1.5 at 36ml/hr and increase by 12ml/hr q 8 hours until goal rate of 50 ml/hr is reached  - Pro-stat liquid protein 12ml daily via tube, each supplement provides 100 kcal, 15 grams protein.  Regimen at goal provides 1900kcal/day, 90g/day protein, 1342ml/day free water   Pt at high refeed risk; monitor K, Mg and P labs daily until stable.   Admitting Dx: Ischemic gastroenteritis ([place]) [K55.9]  Body mass index is 20.54 kg/m. Pt meets criteria for normal weight based on current BMI.  Labs:  Recent Labs  Lab 05/27/19 1215 05/28/19 0153 05/29/19 0531  NA 137 138 139  K 4.4 3.8 3.9  CL 106 111 110  CO2 24 22 20*  BUN 24* 20 20  CREATININE 1.38* 1.35* 1.27*  CALCIUM 8.5* 8.0* 8.1*  MG 2.5* 2.2 2.2  PHOS  --  3.2 3.5  GLUCOSE 131* 151* 133*     Gaynell Face, MS, RD, LDN Inpatient Clinical Dietitian Pager: [phone] Weekend/After Hours: [phone]

## 2019-05-29 NOTE — Progress Notes (Signed)
1 Day Post-Op   Subjective/Chief Complaint: Started on Levophed last night- SBP 80s. Now on Dopamin/Levophed. Denies abdominal pain.   Objective: Vital signs in last 24 hours: Temp:  [98.2 F (36.8 C)-99.1 F (37.3 C)] 98.7 F (37.1 C) (07/25 0730) Pulse Rate:  [46-86] 55 (07/25 0815) Resp:  [7-23] 11 (07/25 0815) BP: (97-166)/(32-118) 130/54 (07/25 0815) SpO2:  [89 %-100 %] 100 % (07/25 0815) Last BM Date: 05/26/19  Intake/Output from previous day: 07/24 0701 - 07/25 0700 In: 1207.7 [I.V.:914.1; IV Piggyback:293.6] Out: 1300 [Urine:1300] Intake/Output this shift: Total I/O In: 356.6 [I.V.:44.1; NG/GT:300; IV Piggyback:12.4] Out: -   General appearance: alert and no distress Resp: clear to auscultation bilaterally GI: soft, non-tender; bowel sounds normal; no masses,  no organomegaly; Left groin site- soft, no hematoma  Lab Results:  Recent Labs    05/28/19 0153  05/29/19 0010 05/29/19 0531  WBC 9.8  --   --  11.5*  HGB 10.1*   < > 10.1* 10.8*  HCT 32.8*   < > 32.9* 35.5*  PLT 204  --   --  225   < > = values in this interval not displayed.   BMET Recent Labs    05/28/19 0153 05/29/19 0531  NA 138 139  K 3.8 3.9  CL 111 110  CO2 22 20*  GLUCOSE 151* 133*  BUN 20 20  CREATININE 1.35* 1.27*  CALCIUM 8.0* 8.1*   PT/INR No results for input(s): LABPROT, INR in the last 72 hours. ABG No results for input(s): PHART, HCO3 in the last 72 hours.  Invalid input(s): PCO2, PO2  Studies/Results: Dg Naso G Tube Plc W/fl W/rad  Result Date: 05/27/2019 CLINICAL DATA:  The off tube placement EXAM: NASO G TUBE PLACEMENT WITH FL AND WITH RAD CONTRAST:  20 mL of Omnipaque 300 FLUOROSCOPY TIME:  Fluoroscopy Time:  4 minutes and 18 seconds Number of Acquired Spot Images: 0 COMPARISON:  None. FINDINGS: A Dobbhoff tube was placed through the left nostril and advanced through the stomach into the distal duodenum. The distal tip is near the ligament of Treitz. IMPRESSION:  Appropriate Dobbhoff tube placement as above. Electronically Signed   By: Dorise Bullion III M.D   On: 05/27/2019 14:57   Ct Angio Abd/pel W/ And/or W/o  Result Date: 05/27/2019 CLINICAL DATA:  78 year old male with infectious gastroenteritis/colitis. Evaluate for mesenteric ischemia. EXAM: CT ANGIOGRAPHY ABDOMEN AND PELVIS WITH CONTRAST AND WITHOUT CONTRAST TECHNIQUE: Multidetector CT imaging of the abdomen and pelvis was performed using the standard protocol during bolus administration of intravenous contrast. Multiplanar reconstructed images and MIPs were obtained and reviewed to evaluate the vascular anatomy. CONTRAST:  133mL ISOVUE-370 IOPAMIDOL (ISOVUE-370) INJECTION 76% COMPARISON:  None. FINDINGS: VASCULAR Aorta: Extensive calcified atherosclerotic plaque throughout the abdominal aorta. No evidence of aneurysm or dissection. There is bulky Coral reef like plaque in the visceral aorta resulting in approximately 40% luminal loss. Celiac: Heavily calcified atherosclerotic plaque results in high-grade stenosis at the origin of the celiac artery. SMA: Heavily calcified atherosclerotic plaque with an additional fibrofatty component results in high-grade stenosis and probable occlusion of the proximal superior mesenteric artery. Renals: Heavy calcified atherosclerotic plaque along both renal arteries without evidence of significant stenosis. No changes of fibromuscular dysplasia. IMA: Hypertrophic inferior mesenteric artery provides the dominant blood supply to the superior mesenteric artery. Inflow: Bulky calcified atherosclerotic plaque bilaterally. Likely multifocal moderate to advanced stenosis throughout the iliac systems. Proximal Outflow: Bulky Coral reef like atherosclerotic plaque in the right common  femoral artery results in a greater than 60% diameter stenosis. Fairly minimal plaque in the left common femoral artery. Heavily diseased superficial femoral arteries bilaterally with chronic occlusion of  the right SFA. Veins: No focal venous abnormality or evidence of thrombus. Review of the MIP images confirms the above findings. NON-VASCULAR Lower chest: Dependent atelectasis in the lower lobes. Patchy areas of tree-in-bud micro nodularity in a peribronchovascular distribution in the dependent portions of the lower lungs. The heart is normal in size. Unremarkable distal thoracic esophagus. Hepatobiliary: Normal hepatic contour and morphology. No discrete hepatic lesions. Normal appearance of the gallbladder. No intra or extrahepatic biliary ductal dilatation. Pancreas: Unremarkable. No pancreatic ductal dilatation or surrounding inflammatory changes. Spleen: Normal in size without focal abnormality. Adrenals/Urinary Tract: Adrenal glands are unremarkable. Kidneys are normal, without renal calculi, focal lesion, or hydronephrosis. Bladder is unremarkable. Stomach/Bowel: No focal bowel wall thickening or evidence of obstruction. Lymphatic: No suspicious lymphadenopathy. Reproductive: Prostatomegaly. Other: No abdominal wall hernia or abnormality. No abdominopelvic ascites. Musculoskeletal: No acute fracture or aggressive appearing lytic or blastic osseous lesion. IMPRESSION: VASCULAR 1. 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 Aortic Atherosclerosis (ICD10-170.0) 2. 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 3. 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 4. The hypertrophic inferior mesenteric artery provides collateral flow to the superior mesenteric artery. 5. Extensive peripheral arterial disease with heavily calcified atherosclerotic plaque throughout the iliac arteries with areas of mild to moderate stenosis bilaterally. 6. Incompletely imaged chronic total occlusion of the right superficial femoral artery.  NON-VASCULAR 1. No focal bowel wall thickening or evidence of infection/inflammation at this time. 2. Nonspecific tree-in-bud micro nodularity in the dependent portions of the visualized lower lungs may represent chronic or subclinical aspiration. 3. Prostatomegaly. Signed, Sterling Big, MD, RPVI [facility] Electronically Signed   By: Malachy Moan M.D.   On: 05/27/2019 14:52    Anti-infectives: Anti-infectives (From admission, onward)   Start     Dose/Rate Route Frequency Ordered Stop   05/28/19 1315  ceFAZolin (ANCEF) IVPB 1 g/50 mL premix     1 g 100 mL/hr over 30 Minutes Intravenous  Once 05/28/19 1312 05/28/19 1548   05/28/19 1300  vancomycin (VANCOCIN) IVPB 1000 mg/200 mL premix     1,000 mg 200 mL/hr over 60 Minutes Intravenous Every 24 hours 05/27/19 1338     05/28/19 1200  vancomycin (VANCOCIN) 1,250 mg in sodium chloride 0.9 % 250 mL IVPB  Status:  Discontinued     1,250 mg 166.7 mL/hr over 90 Minutes Intravenous Every 24 hours 05/27/19 1112 05/27/19 1338   05/27/19 1400  piperacillin-tazobactam (ZOSYN) IVPB 3.375 g     3.375 g 12.5 mL/hr over 240 Minutes Intravenous Every 8 hours 05/27/19 1058     05/27/19 1115  vancomycin (VANCOCIN) 1,750 mg in sodium chloride 0.9 % 500 mL IVPB     1,750 mg 250 mL/hr over 120 Minutes Intravenous  Once 05/27/19 1112 05/27/19 2113      Assessment/Plan: s/p Procedure(s): VISCERAL ANGIOGRAPHY (N/A)   S/P SMA angioplasty; stenting  Continue supportive care. Monitor exam.   LOS: 2 days    Eli Hose A 05/29/2019

## 2019-05-29 NOTE — Progress Notes (Signed)
GI note  Patient seen and examined. Lethargic, NGT with TF running.  Vitals signs reviewed. Hypotensive on Pressors.   HEENT: NGT in left nares. PERRLA  Chest: Coarse breath sounds  CV: slight tachy  Abd: soft, diffusely tender, no rebound. BS hypoactive.  Ext: No edema.   Impression:  1. Hypotensive shock from hypovolemia - On pressors, IV resuscitation, BP is up.  2. 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  3. Anemia secondary to blood loss. Hgb stable at 11.5.   Recommendations:  1. Continue IV acid suppression, vasopressor support per Primary team. Further recommendations per Vascular Surgery.   2. Will follow peripherally. Call us back if we can help.   Robet Leu, M.D. ABIM Diplomate in Gastroenterology [place]

## 2019-05-29 NOTE — Progress Notes (Signed)
Shift summary:  - Remains on Dopamine and Levophed, titrating down.

## 2019-05-29 NOTE — Progress Notes (Signed)
CRITICAL CARE PROGRESS NOTE       SUBJECTIVE FINDINGS & SIGNIFICANT EVENTS   Patient remains critically ill, weaning vasopressors for hypotension  Starting heparin gtt today for SMA stenosis.  Tolerating NG feeds well  Discussed care plan with wife Donita Manny.  PAST MEDICAL HISTORY   Past Medical History:  Diagnosis Date  . Anemia   . Blood transfusion without reported diagnosis   . CAD (coronary artery disease)    a. 03/2019 Cath: LM nl, LAD 50p CA2+, 5356m, D1 50ost, LCX 20p, 3759m, RCA small, mild diff dzs-->Med rx. Consider PCI LCX for refractory angina.  . Carotid arterial disease (HCC)    a. 03/2019 Carotid U/S: RICA 1-39%, RECA <50%, LICA 1-39%, LECA <50%.  . CHF (congestive heart failure) (HCC)   . Clotting disorder (HCC)   . Diabetes mellitus without complication (HCC)   . GIB (gastrointestinal bleeding)    a. 03/2019 BRBPR following PV procedure req anticoagulation-->2u PRBCs.  . Heart murmur   . HFrEF (heart failure with reduced ejection fraction) (HCC)    a. 03/2019 Echo: EF 30-35%, mild conc LVH. Mildly dil LA. Mod MV annular dil. Mod AS (mean grad 18mmHg, Valve area 0.76).  Marland Kitchen. History of tobacco abuse   . Hypertension   . Ischemic cardiomyopathy    a. 03/2019 Echo: EF 30-35%.  . Left bundle branch block   . Moderate aortic stenosis    a. 03/2019 Echo:  Mod AS (mean grad 18mmHg, Valve area 0.76).  Marland Kitchen. PAD (peripheral artery disease) (HCC)    a. 03/2019 PV Angio: RCFA 80, RSFA 100.  . Rib fracture   . Sinus bradycardia   . Subclavian arterial stenosis (HCC)    a. 03/2019 PV Angio: RSCA 90 after origin of CCA. RCCA 30ost, LSCA 80 (PTA and covered stenting).     SURGICAL HISTORY   Past Surgical History:  Procedure Laterality Date  . AORTIC ARCH ANGIOGRAPHY N/A 03/31/2019   Procedure: AORTIC  ARCH ANGIOGRAPHY;  Surgeon: Iran OuchArida, Muhammad A, MD;  Location: [facility]IVE [facility];  Service: Cardiovascular;  Laterality: N/A;  . CARDIAC CATHETERIZATION    . ESOPHAGOGASTRODUODENOSCOPY (EGD) WITH PROPOFOL N/A 05/27/2019   Procedure: ESOPHAGOGASTRODUODENOSCOPY (EGD) WITH PROPOFOL;  Surgeon: Toney ReilVanga, Rohini Reddy, MD;  [facility];  Service: Gastroenterology;  Laterality: N/A;  . PERIPHERAL VASCULAR INTERVENTION Left 03/31/2019   Procedure: PERIPHERAL VASCULAR INTERVENTION;  Surgeon: Iran OuchArida, Muhammad A, MD;  Location: [facility] INVASIVE CV LAB;  Service: Cardiovascular;  Laterality: Left;  . RIGHT/LEFT HEART CATH AND CORONARY ANGIOGRAPHY N/A 03/15/2019   Procedure: RIGHT/LEFT HEART CATH AND CORONARY ANGIOGRAPHY;  Surgeon: Iran OuchArida, Muhammad A, MD;  Location: [facility] INVASIVE CV LAB;  Service: Cardiovascular;  Laterality: N/A;     FAMILY HISTORY   Family History  Problem Relation Age of Onset  . Congestive Heart Failure Father        had AICD placed.   . Dementia Father   . Dementia Mother      SOCIAL HISTORY   Social History   Tobacco Use  . Smoking status: Former Smoker    Packs/day: 1.50    Years: 57.00    Pack years: 85.50    Types: Cigarettes  . Smokeless tobacco: Never Used  Substance Use Topics  . Alcohol use: No    Frequency: Never  . Drug use: Never     MEDICATIONS   Current Medication:  Current Facility-Administered Medications:  .  atorvastatin (LIPITOR) tablet 80 mg, 80 mg, Oral, QHS, Dew, Barbara CowerJason  S, MD, 80 mg at 05/27/19 2131 .  Chlorhexidine Gluconate Cloth 2 % PADS 6 each, 6 each, Topical, Q0600, Annice Needy, MD, 6 each at 05/27/19 2100 .  DOPamine (INTROPIN) 800 mg in dextrose 5 % 250 mL (3.2 mg/mL) infusion, 2.5 mcg/kg/min, Intravenous, Titrated, Dew, Marlow Baars, MD, Last Rate: 2.96 mL/hr at 05/29/19 0400, 2.5 mcg/kg/min at 05/29/19 0400 .  free water 75 mL, 75 mL, Per Tube, Q4H, Annice Needy, MD, 75 mL at 05/28/19 2238 .  HYDROmorphone (DILAUDID) injection 1 mg,  1 mg, Intravenous, Once PRN, Wyn Quaker, Marlow Baars, MD .  Melatonin TABS 10 mg, 10 mg, Oral, QHS, Dew, Marlow Baars, MD, 10 mg at 05/27/19 2253 .  norepinephrine (LEVOPHED) 4 mg in dextrose 5 % 250 mL (0.016 mg/mL) infusion, 0-40 mcg/min, Intravenous, Titrated, Hallaji, Sheema M, RPH, Last Rate: 22.5 mL/hr at 05/29/19 0400, 6 mcg/min at 05/29/19 0400 .  ondansetron (ZOFRAN) injection 4 mg, 4 mg, Intravenous, Q6H PRN, Annice Needy, MD, 4 mg at 05/28/19 2237 .  pantoprazole (PROTONIX) 80 mg in sodium chloride 0.9 % 250 mL (0.32 mg/mL) infusion, 8 mg/hr, Intravenous, Continuous, Dew, Marlow Baars, MD, Last Rate: 25 mL/hr at 05/29/19 0400, 8 mg/hr at 05/29/19 0400 .  [START ON 05/30/2019] pantoprazole (PROTONIX) injection 40 mg, 40 mg, Intravenous, Q12H, Dew, Jason S, MD .  piperacillin-tazobactam (ZOSYN) IVPB 3.375 g, 3.375 g, Intravenous, Q8H, Dew, Marlow Baars, MD, Last Rate: 12.5 mL/hr at 05/29/19 0615, 3.375 g at 05/29/19 0615 .  sodium chloride (PF) 0.9 % injection 10 mL, 10 mL, Other, PRN, Annice Needy, MD, 10 mL at 05/27/19 1450 .  vancomycin (VANCOCIN) IVPB 1000 mg/200 mL premix, 1,000 mg, Intravenous, Q24H, Dew, Marlow Baars, MD, Stopped at 05/28/19 2128    ALLERGIES   Patient has no known allergies.    REVIEW OF SYSTEMS     10 point ROS done and is negative except as per subjective findings.   PHYSICAL EXAMINATION   Vitals:   05/29/19 0500 05/29/19 0530  BP: (!) 148/50 (!) 147/52  Pulse: (!) 58 (!) 59  Resp: 15 14  Temp:    SpO2: 94% 94%    GENERAL:NAD  HEAD: Normocephalic, atraumatic.  EYES: Pupils equal, round, reactive to light.  No scleral icterus.  MOUTH: Moist mucosal membrane. NECK: Supple. No thyromegaly. No nodules. No JVD.  PULMONARY: CTAB CARDIOVASCULAR: S1 and S2. Regular rate and rhythm. No murmurs, rubs, or gallops.  GASTROINTESTINAL: Soft, nontender, non-distended. No masses. Positive bowel sounds. No hepatosplenomegaly.  MUSCULOSKELETAL: No swelling, clubbing, or edema.   NEUROLOGIC: Mild distress due to acute illness SKIN:intact,warm,dry   LABS AND IMAGING       LAB RESULTS: Recent Labs  Lab 05/27/19 1215 05/28/19 0153 05/29/19 0531  NA 137 138 139  K 4.4 3.8 3.9  CL 106 111 110  CO2 24 22 20*  BUN 24* 20 20  CREATININE 1.38* 1.35* 1.27*  GLUCOSE 131* 151* 133*   Recent Labs  Lab 05/27/19 1958  05/28/19 0153  05/28/19 2015 05/29/19 0010 05/29/19 0531  HGB 10.3*   < > 10.1*   < > 10.7* 10.1* 10.8*  HCT 33.1*   < > 32.8*   < > 34.6* 32.9* 35.5*  WBC 7.3  --  9.8  --   --   --  11.5*  PLT 220  --  204  --   --   --  225   < > = values in this interval not  displayed.     IMAGING RESULTS: No results found.    ASSESSMENT AND PLAN    -Multidisciplinary rounds held today  Peptic ulcer disease  -with necrosis of lesser curverature - s/p GI evaluation - PPI/IVF, ABX -h/h monitoring -NG feeds   Ischemic gastroenteritis  SMA occlusion -vascular surgery on case - appreciate recommendations - s/p SMA stenting - heparin gtt today ICU monitoring    Acute on chronic renal failure -follow chem 7 -follow UO -continue Foley Catheter-assess need daily   ID -continue IV abx as prescibed -follow up cultures    GI/Nutrition GI PROPHYLAXIS as indicated DIET-->TF's as tolerated Constipation protocol as indicated  ENDO - ICU hypoglycemic\Hyperglycemia protocol -check FSBS per protocol   ELECTROLYTES -follow labs as needed -replace as needed -pharmacy consultation   DVT/GI PRX ordered -SCDs  TRANSFUSIONS AS NEEDED MONITOR FSBS ASSESS the need for LABS as needed   Critical care provider statement:    Critical care time (minutes):  32   Critical care time was exclusive of:  Separately billable procedures and treating other patients   Critical care was necessary to treat or prevent imminent or life-threatening deterioration of the following conditions:  acute ischemic gastroenteritis, peptic ulcer disease, aki,  multiple comorbid conditions   Critical care was time spent personally by me on the following activities:  Development of treatment plan with patient or surrogate, discussions with consultants, evaluation of patient's response to treatment, examination of patient, obtaining history from patient or surrogate, ordering and performing treatments and interventions, ordering and review of laboratory studies and re-evaluation of patient's condition.  I assumed direction of critical care for this patient from another provider in my specialty: no    This document was prepared using Dragon voice recognition software and may include unintentional dictation errors.    Ottie Glazier, M.D.  Division of [facility]

## 2019-05-29 NOTE — Progress Notes (Signed)
Pharmacy Antibiotic Note  Kevin Case is a 78 y.o. male admitted on 05/27/2019 with ischemic gastroenteritis.  Pharmacy has been consulted for Vancomycin and Zosyn dosing.  Plan: Continue vancomycin 1000 mg IV Q 24 hrs. Goal AUC 400-550. Expected AUC: 523.5 SCr used: 1.27  Continue Zosyn 3.375g IV q8h extended infusion   Height: 5\' 9"  (175.3 cm) Weight: 139 lb 1.8 oz (63.1 kg) IBW/kg (Calculated) : 70.7  Temp (24hrs), Avg:98.6 F (37 C), Min:98 F (36.7 C), Max:99.1 F (37.3 C)  Recent Labs  Lab 05/27/19 1007 05/27/19 1215 05/27/19 1958 05/28/19 0153 05/29/19 0531  WBC 13.2*  --  7.3 9.8 11.5*  CREATININE  --  1.38*  --  1.35* 1.27*  LATICACIDVEN  --   --   --  0.8  --     Estimated Creatinine Clearance: 44.9 mL/min (A) (by C-G formula based on SCr of 1.27 mg/dL (H)).    No Known Allergies  Antimicrobials this admission: Vancomycin 7/23 >>  Zosyn 7/23 >>   Microbiology: 7/23 Blood cx: NGTD 7/23 Urine cx: pending 7/23 MRSA PCR: negative 7/24 SARS Coronavirus 2 negative  Thank you for allowing pharmacy to be a part of this patient's care.  Dorena Bodo, PharmD Clinical Pharmacist 05/29/2019 6:16 AM

## 2019-05-29 NOTE — Consult Note (Addendum)
ANTICOAGULATION CONSULT NOTE - Follow Up Consult  Pharmacy Consult for Heparin infusion Indication: SMA stenosis    No Known Allergies  Patient Measurements: Height: 5\' 9"  (175.3 cm) Weight: 139 lb 1.8 oz (63.1 kg) IBW/kg (Calculated) : 70.7 Heparin Dosing Weight: 63.1 kg   Vital Signs: Temp: 97.8 F (36.6 C) (07/25 1200) Temp Source: Axillary (07/25 1200) BP: 144/46 (07/25 1300) Pulse Rate: 53 (07/25 1300)  Labs: Recent Labs    05/27/19 1215 05/27/19 1958  05/28/19 0153  05/28/19 2015 05/29/19 0010 05/29/19 0531  HGB  --  10.3*   < > 10.1*   < > 10.7* 10.1* 10.8*  HCT  --  33.1*   < > 32.8*   < > 34.6* 32.9* 35.5*  PLT  --  220  --  204  --   --   --  225  CREATININE 1.38*  --   --  1.35*  --   --   --  1.27*   < > = values in this interval not displayed.    Estimated Creatinine Clearance: 44.9 mL/min (A) (by C-G formula based on SCr of 1.27 mg/dL (H)).   Medications:  Heparin injection PRN 7/24 @ 1416  Assessment: Patient has not recently had any other anticoagulants. Per chart review, patient is start heparin infusion without a bolus. Patient has a PMH significant for CAD, ischemic cardiomyopathy, hypertension, HFrEF, DM, PAD, subclavian stenosis, PUD, and GIB presenting as direct admit from endoscopy suite with ischemic gastritis status post EGD. Endoscopy noted to have many oozing cratered gastric ulcers with oozing hemorrhage in gastric body  Hgb 10.8 >> Plt 225 >>  Discussed with Dr. Lorenso Courier & Dr. Alice Reichert about the heparin consult and patient will not be starting heparin today with no bolus.    Plan:  Baseline labs have been ordered  Heparin DW: 63.1 kg Start heparin infusion at 750 units/hr Check anti-Xa level in 8 hours and daily while on heparin, per protocol Will closely monitor CBC. Continue to monitor H&H and platelets   Fawnda Vitullo R Wilmon Conover 05/29/2019,3:00 PM

## 2019-05-29 NOTE — Progress Notes (Signed)
[facility] - [place] at [facility]   PATIENT NAME: Kevin Case    MR#:  [id number]  DATE OF BIRTH:  October 29, 1941  SUBJECTIVE:  CHIEF COMPLAINT:  No chief complaint on file.  -Patient with multiple gastric ulcers, admitted for vascular procedure.  Hypotensive and on dopamine and levophed -Complains of intermittent abdominal pain.  Has a Dobbhoff tube past pylorus  REVIEW OF SYSTEMS:  Review of Systems  Constitutional: Negative for chills, fever and malaise/fatigue.  HENT: Negative for congestion, ear discharge, hearing loss and nosebleeds.   Eyes: Negative for blurred vision and double vision.  Respiratory: Negative for cough, shortness of breath and wheezing.   Cardiovascular: Negative for chest pain and palpitations.  Gastrointestinal: Positive for abdominal pain. Negative for constipation, diarrhea, nausea and vomiting.  Genitourinary: Negative for dysuria.  Musculoskeletal: Negative for myalgias.  Neurological: Negative for dizziness, focal weakness, seizures, weakness and headaches.  Psychiatric/Behavioral: Negative for depression.    DRUG ALLERGIES:  No Known Allergies  VITALS:  Blood pressure (!) 130/54, pulse (!) 55, temperature 98.7 F (37.1 C), temperature source Oral, resp. rate 11, height 5\' 9"  (1.753 m), weight 63.1 kg, SpO2 100 %.  PHYSICAL EXAMINATION:  Physical Exam   GENERAL:  78 y.o.-year-old thin built patient lying in the bed with no acute distress.  EYES: Pupils equal, round, reactive to light and accommodation. No scleral icterus. Extraocular muscles intact.  HEENT: Head atraumatic, normocephalic. Oropharynx and nasopharynx clear.  NECK:  Supple, no jugular venous distention. No thyroid enlargement, no tenderness.  LUNGS: Normal breath sounds bilaterally, no wheezing, rales,rhonchi or crepitation. No use of accessory muscles of respiration.  Decreased bibasilar breath sounds CARDIOVASCULAR: S1, S2 normal. No murmurs, rubs, or  gallops.  ABDOMEN: Soft, minimal epigastric tenderness, nondistended. Bowel sounds present. No organomegaly or mass.  EXTREMITIES: No pedal edema, cyanosis, or clubbing.  NEUROLOGIC: Cranial nerves II through XII are intact. Muscle strength 5/5 in all extremities. Sensation intact. Gait not checked.  PSYCHIATRIC: The patient is alert and oriented x 3.  SKIN: No obvious rash, lesion, or ulcer.    LABORATORY PANEL:   CBC Recent Labs  Lab 05/29/19 0531  WBC 11.5*  HGB 10.8*  HCT 35.5*  PLT 225   ------------------------------------------------------------------------------------------------------------------  Chemistries  Recent Labs  Lab 05/29/19 0531  NA 139  K 3.9  CL 110  CO2 20*  GLUCOSE 133*  BUN 20  CREATININE 1.27*  CALCIUM 8.1*  MG 2.2  AST 53*  ALT 43  ALKPHOS 80  BILITOT 1.0   ------------------------------------------------------------------------------------------------------------------  Cardiac Enzymes No results for input(s): TROPONINI in the last 168 hours. ------------------------------------------------------------------------------------------------------------------  RADIOLOGY:  Dg Naso G Tube Plc W/fl W/rad  Result Date: 05/27/2019 CLINICAL DATA:  The off tube placement EXAM: NASO G TUBE PLACEMENT WITH FL AND WITH RAD CONTRAST:  20 mL of Omnipaque 300 FLUOROSCOPY TIME:  Fluoroscopy Time:  4 minutes and 18 seconds Number of Acquired Spot Images: 0 COMPARISON:  None. FINDINGS: A Dobbhoff tube was placed through the left nostril and advanced through the stomach into the distal duodenum. The distal tip is near the ligament of Treitz. IMPRESSION: Appropriate Dobbhoff tube placement as above. Electronically Signed   By: Gerome Sam III M.D   On: 05/27/2019 14:57   Ct Angio Abd/pel W/ And/or W/o  Result Date: 05/27/2019 CLINICAL DATA:  78 year old male with infectious gastroenteritis/colitis. Evaluate for mesenteric ischemia. EXAM: CT ANGIOGRAPHY  ABDOMEN AND PELVIS WITH CONTRAST AND WITHOUT CONTRAST TECHNIQUE: Multidetector CT imaging of  the abdomen and pelvis was performed using the standard protocol during bolus administration of intravenous contrast. Multiplanar reconstructed images and MIPs were obtained and reviewed to evaluate the vascular anatomy. CONTRAST:  100mL ISOVUE-370 IOPAMIDOL (ISOVUE-370) INJECTION 76% COMPARISON:  None. FINDINGS: VASCULAR Aorta: Extensive calcified atherosclerotic plaque throughout the abdominal aorta. No evidence of aneurysm or dissection. There is bulky Coral reef like plaque in the visceral aorta resulting in approximately 40% luminal loss. Celiac: Heavily calcified atherosclerotic plaque results in high-grade stenosis at the origin of the celiac artery. SMA: Heavily calcified atherosclerotic plaque with an additional fibrofatty component results in high-grade stenosis and probable occlusion of the proximal superior mesenteric artery. Renals: Heavy calcified atherosclerotic plaque along both renal arteries without evidence of significant stenosis. No changes of fibromuscular dysplasia. IMA: Hypertrophic inferior mesenteric artery provides the dominant blood supply to the superior mesenteric artery. Inflow: Bulky calcified atherosclerotic plaque bilaterally. Likely multifocal moderate to advanced stenosis throughout the iliac systems. Proximal Outflow: Bulky Coral reef like atherosclerotic plaque in the right common femoral artery results in a greater than 60% diameter stenosis. Fairly minimal plaque in the left common femoral artery. Heavily diseased superficial femoral arteries bilaterally with chronic occlusion of the right SFA. Veins: No focal venous abnormality or evidence of thrombus. Review of the MIP images confirms the above findings. NON-VASCULAR Lower chest: Dependent atelectasis in the lower lobes. Patchy areas of tree-in-bud micro nodularity in a peribronchovascular distribution in the dependent portions of  the lower lungs. The heart is normal in size. Unremarkable distal thoracic esophagus. Hepatobiliary: Normal hepatic contour and morphology. No discrete hepatic lesions. Normal appearance of the gallbladder. No intra or extrahepatic biliary ductal dilatation. Pancreas: Unremarkable. No pancreatic ductal dilatation or surrounding inflammatory changes. Spleen: Normal in size without focal abnormality. Adrenals/Urinary Tract: Adrenal glands are unremarkable. Kidneys are normal, without renal calculi, focal lesion, or hydronephrosis. Bladder is unremarkable. Stomach/Bowel: No focal bowel wall thickening or evidence of obstruction. Lymphatic: No suspicious lymphadenopathy. Reproductive: Prostatomegaly. Other: No abdominal wall hernia or abnormality. No abdominopelvic ascites. Musculoskeletal: No acute fracture or aggressive appearing lytic or blastic osseous lesion. IMPRESSION: VASCULAR 1. 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 Aortic Atherosclerosis (ICD10-170.0) 2. 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 3. 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 4. The hypertrophic inferior mesenteric artery provides collateral flow to the superior mesenteric artery. 5. Extensive peripheral arterial disease with heavily calcified atherosclerotic plaque throughout the iliac arteries with areas of mild to moderate stenosis bilaterally. 6. Incompletely imaged chronic total occlusion of the right superficial femoral artery. NON-VASCULAR 1. No focal bowel wall thickening or evidence of infection/inflammation at this time. 2. Nonspecific tree-in-bud micro nodularity in the dependent portions of the visualized lower lungs may represent chronic or subclinical aspiration. 3. Prostatomegaly. Signed, Sterling BigHeath K. McCullough, MD, RPVI [facility] Electronically Signed   By: Malachy MoanHeath  McCullough M.D.   On: 05/27/2019 14:52    EKG:   Orders placed or performed during the hospital encounter of 05/27/19  . EKG 12-Lead  . EKG 12-Lead  . EKG 12-Lead  . EKG 12-Lead    ASSESSMENT AND PLAN:   78 year old male with past medical history significant for subclavian stenosis, PAD, aortic stenosis, ischemic cardiomyopathy, hypertension, chronic combined heart failure, history of gastric ulcers and GI bleed, known history of iron deficiency anemia admitted after EGD showing several necrotic  gastric ulcers.  1.  Hypotension-likely hypovolemic shock -Received IV fluids.  Currently on dopamine and Levophed drips.  Wean as tolerated -Blood pressure is improving. -No evidence of sepsis.  On empiric vancomycin and Zosyn-rule out sepsis  2.  Several actively bleeding gastric ulcers-minimal oozing noted.  Hemoglobin is stable.  Did not receive any transfusion this admission -Appreciate GI consult.  Prior history of gastric ulcers as well.  Likely secondary to mild ischemic necrosis from severe celiac and SMA stenosis -Appreciate general surgery consult.  No indication for surgery at this time. -Continue IV Protonix  3.  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 -Not on any heparin drip at this time  4.  CAD and combined CHF-stable at this time.  Continue to monitor closely.  5.  DVT prophylaxis-teds and SCDs only for now  Lives at home with his wife.  Independent at baseline Management per ICU team for now   All the records are reviewed and case discussed with Care Management/Social Workerr. Management plans discussed with the patient, family and they are in agreement.  CODE STATUS:  Full Code  TOTAL TIME TAKING CARE OF THIS PATIENT: 39 minutes.   POSSIBLE D/C IN 2-3 DAYS, DEPENDING  ON CLINICAL CONDITION.   Gladstone Lighter M.D on 05/29/2019 at 9:43 AM  Between 7am to 6pm - Pager - [phone]  After 6pm go to [email or web address] - password [email or web address] [facility]  Office  [phone]  CC: Primary care physician; Trinna Post, PA-C

## 2019-05-30 LAB — CBC
HCT: 34.5 % — ABNORMAL LOW (ref 39.0–52.0)
Hemoglobin: 10.5 g/dL — ABNORMAL LOW (ref 13.0–17.0)
MCH: 26.4 pg (ref 26.0–34.0)
MCHC: 30.4 g/dL (ref 30.0–36.0)
MCV: 86.7 fL (ref 80.0–100.0)
Platelets: 220 10*3/uL (ref 150–400)
RBC: 3.98 MIL/uL — ABNORMAL LOW (ref 4.22–5.81)
RDW: 24.6 % — ABNORMAL HIGH (ref 11.5–15.5)
WBC: 6.2 10*3/uL (ref 4.0–10.5)
nRBC: 0 % (ref 0.0–0.2)

## 2019-05-30 LAB — HEPARIN LEVEL (UNFRACTIONATED)
Heparin Unfractionated: 0.14 IU/mL — ABNORMAL LOW (ref 0.30–0.70)
Heparin Unfractionated: 0.3 IU/mL (ref 0.30–0.70)
Heparin Unfractionated: 0.23 IU/mL — ABNORMAL LOW (ref 0.30–0.70)
Heparin Unfractionated: 0.32 IU/mL (ref 0.30–0.70)

## 2019-05-30 LAB — BASIC METABOLIC PANEL
Anion gap: 4 — ABNORMAL LOW (ref 5–15)
BUN: 23 mg/dL (ref 8–23)
CO2: 24 mmol/L (ref 22–32)
Calcium: 7.7 mg/dL — ABNORMAL LOW (ref 8.9–10.3)
Chloride: 110 mmol/L (ref 98–111)
Creatinine, Ser: 1.4 mg/dL — ABNORMAL HIGH (ref 0.61–1.24)
GFR calc Af Amer: 57 mL/min — ABNORMAL LOW (ref >60)
GFR calc non Af Amer: 49 mL/min — ABNORMAL LOW (ref >60)
Glucose, Bld: 146 mg/dL — ABNORMAL HIGH (ref 70–99)
Potassium: 3.8 mmol/L (ref 3.5–5.1)
Sodium: 138 mmol/L (ref 135–145)

## 2019-05-30 LAB — PROCALCITONIN: Procalcitonin: <0.1 ng/mL

## 2019-05-30 MED ORDER — SENNOSIDES 8.8 MG/5ML PO SYRP
5.0000 mL | ORAL_SOLUTION | Freq: Every day | ORAL | Status: DC
  Administered 2019-05-30: 21:00:00 5 mL
  Filled 2019-05-30 (×3): qty 5

## 2019-05-30 MED ORDER — VANCOMYCIN HCL IN DEXTROSE 750-5 MG/150ML-% IV SOLN
750.0000 mg | INTRAVENOUS | Status: DC
  Administered 2019-05-30: 750 mg via INTRAVENOUS
  Filled 2019-05-30 (×2): qty 150

## 2019-05-30 MED ORDER — DOCUSATE SODIUM 50 MG/5ML PO LIQD
100.0000 mg | Freq: Every day | ORAL | Status: DC
  Administered 2019-05-30: 100 mg
  Filled 2019-05-30: qty 10

## 2019-05-30 NOTE — Consult Note (Signed)
ANTICOAGULATION CONSULT NOTE - Follow Up Consult  Pharmacy Consult for Heparin infusion Indication: SMA stenosis    No Known Allergies  Patient Measurements: Height: 5\' 9"  (175.3 cm) Weight: 139 lb 1.8 oz (63.1 kg) IBW/kg (Calculated) : 70.7 Heparin Dosing Weight: 63.1 kg   Vital Signs: Temp: 98.2 F (36.8 C) (07/26 0815) Temp Source: Oral (07/26 0815) BP: 110/51 (07/26 0700) Pulse Rate: 60 (07/26 0815)  Labs: Recent Labs    05/28/19 0153  05/29/19 0010 05/29/19 0531 05/29/19 1521 05/30/19 0102 05/30/19 0828  HGB 10.1*   < > 10.1* 10.8*  --  10.5*  --   HCT 32.8*   < > 32.9* 35.5*  --  34.5*  --   PLT 204  --   --  225  --  220  --   APTT  --   --   --   --  32  --   --   LABPROT  --   --   --   --  15.8*  --   --   INR  --   --   --   --  1.3*  --   --   HEPARINUNFRC  --   --   --   --   --  0.14* 0.30  CREATININE 1.35*  --   --  1.27*  --  1.40*  --    < > = values in this interval not displayed.    Estimated Creatinine Clearance: 40.7 mL/min (A) (by C-G formula based on SCr of 1.4 mg/dL (H)).   Assessment: Patient has not recently had any other anticoagulants. Per chart review, patient is start heparin infusion without a bolus. Patient has a PMH significant for CAD, ischemic cardiomyopathy, hypertension, HFrEF, DM, PAD, subclavian stenosis, PUD, and GIB presenting as direct admit from endoscopy suite with ischemic gastritis status post EGD. Endoscopy noted to have many oozing cratered gastric ulcers with oozing hemorrhage in gastric body  Goal: Heparin Level: 0.30 - 0.70 IU/mL   Plan:  7/26 @ 08:30 HL: 0.30. Continue current infusion rate of 900 units/hr Check anti-Xa level in 8 hours and daily while on heparin, per protocol Will closely monitor CBC. Continue to monitor H&H and platelets    HL ordered for 7/26 at 16:30  Olivia Canter, [place] Pharmacist 05/30/2019 10:34 AM

## 2019-05-30 NOTE — Progress Notes (Signed)
Updated wife via telephone. Wife states that the patient is requesting a laxative. Reassured wife that NP would be notified.

## 2019-05-30 NOTE — Consult Note (Addendum)
ANTICOAGULATION CONSULT NOTE - Follow Up Consult  Pharmacy Consult for Heparin infusion Indication: SMA stenosis    No Known Allergies  Patient Measurements: Height: 5\' 9"  (175.3 cm) Weight: 139 lb 1.8 oz (63.1 kg) IBW/kg (Calculated) : 70.7 Heparin Dosing Weight: 63.1 kg   Vital Signs: Temp: 98.2 F (36.8 C) (07/26 0000) Temp Source: Oral (07/26 0000) BP: 101/49 (07/26 0045) Pulse Rate: 55 (07/26 0045)  Labs: Recent Labs    05/28/19 0153  05/29/19 0010 05/29/19 0531 05/29/19 1521 05/30/19 0102  HGB 10.1*   < > 10.1* 10.8*  --  10.5*  HCT 32.8*   < > 32.9* 35.5*  --  34.5*  PLT 204  --   --  225  --  220  APTT  --   --   --   --  32  --   LABPROT  --   --   --   --  15.8*  --   INR  --   --   --   --  1.3*  --   HEPARINUNFRC  --   --   --   --   --  0.14*  CREATININE 1.35*  --   --  1.27*  --  1.40*   < > = values in this interval not displayed.    Estimated Creatinine Clearance: 40.7 mL/min (A) (by C-G formula based on SCr of 1.4 mg/dL (H)).   Medications:  Heparin injection PRN 7/24 @ 1416  Assessment: Patient has not recently had any other anticoagulants. Per chart review, patient is start heparin infusion without a bolus. Patient has a PMH significant for CAD, ischemic cardiomyopathy, hypertension, HFrEF, DM, PAD, subclavian stenosis, PUD, and GIB presenting as direct admit from endoscopy suite with ischemic gastritis status post EGD. Endoscopy noted to have many oozing cratered gastric ulcers with oozing hemorrhage in gastric body  Hgb 10.8 >> 10.5 Plt 225 >>220   7/25 Heparin infusion started @ 750 unit/Hr with NO BOLUS.  Goal: Heparin Level: 0.30 - 0.70 IU/mL   Plan:  7/26 @ 0102 HL: 0.14. Level is subtherapeutic.  No Bolus. Will increase heparin infusion to 900 units/hr Check anti-Xa level in 6 hours and daily while on heparin, per protocol Will closely monitor CBC. Continue to monitor H&H and platelets   Pernell Dupre, PharmD,  BCPS Clinical Pharmacist 05/30/2019 1:48 AM

## 2019-05-30 NOTE — Progress Notes (Signed)
[place] at [place] NAME: Kevin Case    MR#:  [id number]  DATE OF BIRTH:  1941/08/17  SUBJECTIVE:  CHIEF COMPLAINT:  No chief complaint on file.  -Patient with multiple ischemic gastric ulcers, s/p SMA stent.  Hypotensive and on pressors. -no abdominal pain.  Has a Dobbhoff tube with tube feeds today  REVIEW OF SYSTEMS:  Review of Systems  Constitutional: Negative for chills, fever and malaise/fatigue.  HENT: Negative for congestion, ear discharge, hearing loss and nosebleeds.   Eyes: Negative for blurred vision and double vision.  Respiratory: Negative for cough, shortness of breath and wheezing.   Cardiovascular: Negative for chest pain and palpitations.  Gastrointestinal: Positive for abdominal pain. Negative for constipation, diarrhea, nausea and vomiting.  Genitourinary: Negative for dysuria.  Musculoskeletal: Negative for myalgias.  Neurological: Negative for dizziness, focal weakness, seizures, weakness and headaches.  Psychiatric/Behavioral: Negative for depression.    DRUG ALLERGIES:  No Known Allergies  VITALS:  Blood pressure (!) 110/51, pulse 60, temperature 98.2 F (36.8 C), temperature source Oral, resp. rate 14, height 5\' 9"  (1.753 m), weight 63.1 kg, SpO2 97 %.  PHYSICAL EXAMINATION:  Physical Exam   GENERAL:  78 y.o.-year-old thin built patient lying in the bed with no acute distress.  EYES: Pupils equal, round, reactive to light and accommodation. No scleral icterus. Extraocular muscles intact.  HEENT: Head atraumatic, normocephalic. Oropharynx and nasopharynx clear.  NECK:  Supple, no jugular venous distention. No thyroid enlargement, no tenderness.  LUNGS: Normal breath sounds bilaterally, no wheezing, rales,rhonchi or crepitation. No use of accessory muscles of respiration.  Decreased bibasilar breath sounds CARDIOVASCULAR: S1, S2 normal. No murmurs, rubs, or gallops.  ABDOMEN: Soft, non tender,  nondistended. Bowel sounds present. No organomegaly or mass.  EXTREMITIES: No pedal edema, cyanosis, or clubbing.  NEUROLOGIC: Cranial nerves II through XII are intact. Muscle strength 5/5 in all extremities. Sensation intact. Gait not checked.  PSYCHIATRIC: The patient is alert and oriented x 3.  SKIN: No obvious rash, lesion, or ulcer.    LABORATORY PANEL:   CBC Recent Labs  Lab 05/30/19 0102  WBC 6.2  HGB 10.5*  HCT 34.5*  PLT 220   ------------------------------------------------------------------------------------------------------------------  Chemistries  Recent Labs  Lab 05/29/19 0531 05/30/19 0102  NA 139 138  K 3.9 3.8  CL 110 110  CO2 20* 24  GLUCOSE 133* 146*  BUN 20 23  CREATININE 1.27* 1.40*  CALCIUM 8.1* 7.7*  MG 2.2  --   AST 53*  --   ALT 43  --   ALKPHOS 80  --   BILITOT 1.0  --    ------------------------------------------------------------------------------------------------------------------  Cardiac Enzymes No results for input(s): TROPONINI in the last 168 hours. ------------------------------------------------------------------------------------------------------------------  RADIOLOGY:  No results found.  EKG:   Orders placed or performed during the hospital encounter of 05/27/19  . EKG 12-Lead  . EKG 12-Lead  . EKG 12-Lead  . EKG 12-Lead    ASSESSMENT AND PLAN:   78 year old male with past medical history significant for subclavian stenosis, PAD, aortic stenosis, ischemic cardiomyopathy, hypertension, chronic combined heart failure, history of gastric ulcers and GI bleed, known history of iron deficiency anemia admitted after EGD showing several necrotic gastric ulcers.  1.  Hypotension-likely hypovolemic shock -Received IV fluids.  on dopamine and Levophed drips- weaned off levophed -Blood pressure is improving. -No evidence of sepsis.  On empiric vancomycin and Zosyn-rule out sepsis- recommend DC ABX, procalcitonin is  negative  2.  Several actively bleeding gastric ulcers-minimal oozing noted.  Hemoglobin is stable.  Did not receive any transfusion this admission -Appreciate GI consult.  Prior history of gastric ulcers as well.  Likely secondary to mild ischemic necrosis from severe celiac and SMA stenosis -Appreciate general surgery consult.  No indication for surgery at this time. -on IV Protonix - started on dobhoff tube feeds  3.  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 - on IV heparin drip at this time  4.  CAD and combined CHF-stable at this time.  Continue to monitor closely.  5.  DVT prophylaxis- on IV heparin  Lives at home with his wife.  Independent at baseline Management per ICU team for now Encourage ambulation once of of drips   All the records are reviewed and case discussed with Care Management/Social Workerr. Management plans discussed with the patient, family and they are in agreement.  CODE STATUS:  Full Code  TOTAL TIME TAKING CARE OF THIS PATIENT: 37 minutes.   POSSIBLE D/C IN 2-3 DAYS, DEPENDING ON CLINICAL CONDITION.   Enid Baas M.D on 05/30/2019 at 9:03 AM  Between 7am to 6pm - Pager - [phone]  After 6pm go to [email or web address] - Social research officer, government  [facility]  Office  [phone]  CC: Primary care physician; Trey Sailors, PA-C

## 2019-05-30 NOTE — Progress Notes (Signed)
Pharmacy Antibiotic Note  Kevin Case is a 78 y.o. male admitted on 05/27/2019 with ischemic gastroenteritis.  Pharmacy has been consulted for Vancomycin and Zosyn dosing.  Plan: Scr: 1.27>> 1.4. Will adjust vancomycin dose to vancomycin 750 mg IV Q 24 hrs. Goal AUC 400-550. Expected AUC: 498 SCr used: 1.4  Continue Zosyn 3.375g IV q8h extended infusion   Height: 5\' 9"  (175.3 cm) Weight: 139 lb 1.8 oz (63.1 kg) IBW/kg (Calculated) : 70.7  Temp (24hrs), Avg:98.2 F (36.8 C), Min:97.8 F (36.6 C), Max:98.7 F (37.1 C)  Recent Labs  Lab 05/27/19 1007 05/27/19 1215 05/27/19 1958 05/28/19 0153 05/29/19 0531 05/30/19 0102  WBC 13.2*  --  7.3 9.8 11.5* 6.2  CREATININE  --  1.38*  --  1.35* 1.27* 1.40*  LATICACIDVEN  --   --   --  0.8  --   --     Estimated Creatinine Clearance: 40.7 mL/min (A) (by C-G formula based on SCr of 1.4 mg/dL (H)).    No Known Allergies  Antimicrobials this admission: Vancomycin 7/23 >>  Zosyn 7/23 >>   Microbiology: 7/23 Blood cx: NGTD 7/23 Urine cx: pending 7/23 MRSA PCR: negative 7/24 SARS Coronavirus 2 negative  Thank you for allowing pharmacy to be a part of this patient's care.  Dorena Bodo, PharmD Clinical Pharmacist 05/30/2019 4:24 AM

## 2019-05-30 NOTE — Consult Note (Addendum)
ANTICOAGULATION CONSULT NOTE - Follow Up Consult  Pharmacy Consult for Heparin infusion Indication: SMA stenosis    No Known Allergies  Patient Measurements: Height: 5\' 9"  (175.3 cm) Weight: 139 lb 1.8 oz (63.1 kg) IBW/kg (Calculated) : 70.7 Heparin Dosing Weight: 63.1 kg   Vital Signs: Temp: 98 F (36.7 C) (07/26 1600) Temp Source: Oral (07/26 1600) BP: 129/49 (07/26 1600) Pulse Rate: 53 (07/26 1600)  Labs: Recent Labs    05/28/19 0153  05/29/19 0010 05/29/19 0531 05/29/19 1521 05/30/19 0102 05/30/19 0828 05/30/19 1623  HGB 10.1*   < > 10.1* 10.8*  --  10.5*  --   --   HCT 32.8*   < > 32.9* 35.5*  --  34.5*  --   --   PLT 204  --   --  225  --  220  --   --   APTT  --   --   --   --  32  --   --   --   LABPROT  --   --   --   --  15.8*  --   --   --   INR  --   --   --   --  1.3*  --   --   --   HEPARINUNFRC  --   --   --   --   --  0.14* 0.30 0.23*  CREATININE 1.35*  --   --  1.27*  --  1.40*  --   --    < > = values in this interval not displayed.    Estimated Creatinine Clearance: 40.7 mL/min (A) (by C-G formula based on SCr of 1.4 mg/dL (H)).   Assessment: Patient has not recently had any other anticoagulants. Per chart review, patient is start heparin infusion without a bolus. Patient has a PMH significant for CAD, ischemic cardiomyopathy, hypertension, HFrEF, DM, PAD, subclavian stenosis, PUD, and GIB presenting as direct admit from endoscopy suite with ischemic gastritis status post EGD. Endoscopy noted to have many oozing cratered gastric ulcers with oozing hemorrhage in gastric body.  Hgb 10.8 >> 10.5 Plt 225 >>220   7/26 @ 0102 HL: 0.14. Level is subtherapeutic. Rate increased to 900 units/hr.  7/26 @ 08:30 HL: 0.30. Level is therapeutic. Continued current rate.  7/26 @ 1623 HL: 0.23  Level is subtherapeutic. Confirmed that heparin infusion was not interrupted.    Goal: Heparin Level: 0.30 - 0.70 IU/mL  Plan:  7/26 @ 1623 HL: 0.23  Level is  subtherapeutic.  NO bolus. Increase infusion rate to 1000 units/hr Check anti-Xa level in 6 hours and daily while on heparin, per protocol Will closely monitor CBC. Continue to monitor H&H and platelets     Rowland Lathe, [facility] Clinical Pharmacist 05/30/2019 5:07 PM

## 2019-05-30 NOTE — Consult Note (Signed)
ANTICOAGULATION CONSULT NOTE - Follow Up Consult  Pharmacy Consult for Heparin infusion Indication: SMA stenosis    No Known Allergies  Patient Measurements: Height: 5\' 9"  (175.3 cm) Weight: 139 lb 1.8 oz (63.1 kg) IBW/kg (Calculated) : 70.7 Heparin Dosing Weight: 63.1 kg   Vital Signs: Temp: 98.2 F (36.8 C) (07/26 1930) Temp Source: Oral (07/26 1930) BP: 125/49 (07/26 2300) Pulse Rate: 54 (07/26 2300)  Labs: Recent Labs    05/28/19 0153  05/29/19 0010 05/29/19 0531 05/29/19 1521  05/30/19 0102 05/30/19 0828 05/30/19 1623 05/30/19 2308  HGB 10.1*   < > 10.1* 10.8*  --   --  10.5*  --   --   --   HCT 32.8*   < > 32.9* 35.5*  --   --  34.5*  --   --   --   PLT 204  --   --  225  --   --  220  --   --   --   APTT  --   --   --   --  32  --   --   --   --   --   LABPROT  --   --   --   --  15.8*  --   --   --   --   --   INR  --   --   --   --  1.3*  --   --   --   --   --   HEPARINUNFRC  --   --   --   --   --    < > 0.14* 0.30 0.23* 0.32  CREATININE 1.35*  --   --  1.27*  --   --  1.40*  --   --   --    < > = values in this interval not displayed.    Estimated Creatinine Clearance: 40.7 mL/min (A) (by C-G formula based on SCr of 1.4 mg/dL (H)).   Assessment: Patient has not recently had any other anticoagulants. Per chart review, patient is start heparin infusion without a bolus. Patient has a PMH significant for CAD, ischemic cardiomyopathy, hypertension, HFrEF, DM, PAD, subclavian stenosis, PUD, and GIB presenting as direct admit from endoscopy suite with ischemic gastritis status post EGD. Endoscopy noted to have many oozing cratered gastric ulcers with oozing hemorrhage in gastric body.  Hgb 10.8 >> 10.5 Plt 225 >>220   7/26 @ 0102 HL: 0.14. Level is subtherapeutic. Rate increased to 900 units/hr.  7/26 @ 08:30 HL: 0.30. Level is therapeutic. Continued current rate.  7/26 @ 1623 HL: 0.23  Level is subtherapeutic. Confirmed that heparin infusion was not  interrupted.  7/26 @ 1623 HL: 0.23  Level is subtherapeutic. Infusion increased to 1000 units/hr  Goal: Heparin Level: 0.30 - 0.70 IU/mL  Plan:  7/26 @ 2308 HL: 0.32. Level is therapeutic x1.  Continue infusion rate of 1000 units/hr Check confirmatory anti-Xa level in 6 hours and daily while on heparin, per protocol Will closely monitor CBC. Continue to monitor H&H and platelets    Pernell Dupre, PharmD, BCPS Clinical Pharmacist 05/30/2019 11:35 PM

## 2019-05-30 NOTE — Progress Notes (Signed)
CRITICAL CARE PROGRESS NOTE       SUBJECTIVE FINDINGS & SIGNIFICANT EVENTS   Patient remains critically ill, we have stopped vasopressors and now on dopamine which is being weaned.   Started heparin gtt today for SMA stenosis, tolerated well overnight no bleeding grossly appreciable.    Tolerating NG feeds well    PAST MEDICAL HISTORY   Past Medical History:  Diagnosis Date  . Anemia   . Blood transfusion without reported diagnosis   . CAD (coronary artery disease)    a. 03/2019 Cath: LM nl, LAD 50p CA2+, 73m, D1 50ost, LCX 20p, 20m, RCA small, mild diff dzs-->Med rx. Consider PCI LCX for refractory angina.  . Carotid arterial disease ([place])    a. 03/2019 Carotid U/S: RICA 1-19%, RECA <14%, LICA 7-82%, LECA <95%.  . CHF (congestive heart failure) ([place])   . Clotting disorder ([place])   . Diabetes mellitus without complication ([place])   . GIB (gastrointestinal bleeding)    a. 03/2019 BRBPR following PV procedure req anticoagulation-->2u PRBCs.  . Heart murmur   . HFrEF (heart failure with reduced ejection fraction) ([place])    a. 03/2019 Echo: EF 30-35%, mild conc LVH. Mildly dil LA. Mod MV annular dil. Mod AS (mean grad 78mmHg, Valve area 0.76).  Marland Kitchen History of tobacco abuse   . Hypertension   . Ischemic cardiomyopathy    a. 03/2019 Echo: EF 30-35%.  . Left bundle branch block   . Moderate aortic stenosis    a. 03/2019 Echo:  Mod AS (mean grad 57mmHg, Valve area 0.76).  Marland Kitchen PAD (peripheral artery disease) ([place])    a. 03/2019 PV Angio: RCFA 80, RSFA 100.  . Rib fracture   . Sinus bradycardia   . 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 03/2019 PV Angio: RSCA 90 after origin of CCA. RCCA 30ost, LSCA 80 (PTA and covered stenting).     SURGICAL HISTORY   Past Surgical History:  Procedure Laterality Date  . AORTIC  ARCH ANGIOGRAPHY N/A 03/31/2019   Procedure: AORTIC ARCH ANGIOGRAPHY;  Surgeon: Wellington Hampshire, MD;  Location: [facility];  Service: Cardiovascular;  Laterality: N/A;  . CARDIAC CATHETERIZATION    . ESOPHAGOGASTRODUODENOSCOPY (EGD) WITH PROPOFOL N/A 05/27/2019   Procedure: ESOPHAGOGASTRODUODENOSCOPY (EGD) WITH PROPOFOL;  Surgeon: Lin Landsman, MD;  Location: [facility];  Service: Gastroenterology;  Laterality: N/A;  . PERIPHERAL VASCULAR INTERVENTION Left 03/31/2019   Procedure: PERIPHERAL VASCULAR INTERVENTION;  Surgeon: Wellington Hampshire, MD;  Location: [facility];  Service: Cardiovascular;  Laterality: Left;  . RIGHT/LEFT HEART CATH AND CORONARY ANGIOGRAPHY N/A 03/15/2019   Procedure: RIGHT/LEFT HEART CATH AND CORONARY ANGIOGRAPHY;  Surgeon: Wellington Hampshire, MD;  Location: [facility];  Service: Cardiovascular;  Laterality: N/A;     FAMILY HISTORY   Family History  Problem Relation Age of Onset  . Congestive Heart Failure Father        had AICD placed.   . Dementia Father   . Dementia Mother      SOCIAL HISTORY   Social History   Tobacco Use  . Smoking status: Former Smoker    Packs/day: 1.50    Years: 57.00    Pack years: 85.50    Types: Cigarettes  . Smokeless tobacco: Never Used  Substance Use Topics  . Alcohol use: No    Frequency: Never  . Drug use: Never     MEDICATIONS   Current Medication:  Current Facility-Administered Medications:  .  0.9 %  sodium chloride infusion, , Intravenous, PRN, Vida RiggerAleskerov, Mariesa Grieder, MD, Last Rate: 5 mL/hr at 05/30/19 0300 .  atorvastatin (LIPITOR) tablet 80 mg, 80 mg, Oral, QHS, Annice Needyew, Jason S, MD, 80 mg at 05/27/19 2131 .  Chlorhexidine Gluconate Cloth 2 % PADS 6 each, 6 each, Topical, Q0600, Annice Needyew, Jason S, MD, 6 each at 05/29/19 1103 .  DOPamine (INTROPIN) 800 mg in dextrose 5 % 250 mL (3.2 mg/mL) infusion, 2.5 mcg/kg/min, Intravenous, Titrated, Dew, Marlow BaarsJason S, MD, Last Rate: 2.96 mL/hr at 05/30/19  0300, 2.5 mcg/kg/min at 05/30/19 0300 .  feeding supplement (OSMOLITE 1.5 CAL) liquid 1,000 mL, 1,000 mL, Per Tube, Continuous, Enid BaasKalisetti, Radhika, MD, Last Rate: 40 mL/hr at 05/30/19 0635 .  feeding supplement (PRO-STAT SUGAR FREE 64) liquid 30 mL, 30 mL, Per Tube, Daily, Nemiah CommanderKalisetti, Radhika, MD, 30 mL at 05/29/19 1040 .  free water 75 mL, 75 mL, Per Tube, Q4H, Annice Needyew, Jason S, MD, 75 mL at 05/30/19 0300 .  heparin ADULT infusion 100 units/mL (25000 units/24150mL sodium chloride 0.45%), 900 Units/hr, Intravenous, Continuous, Hallaji, Sheema M, RPH, Last Rate: 9 mL/hr at 05/30/19 0300, 900 Units/hr at 05/30/19 0300 .  HYDROmorphone (DILAUDID) injection 1 mg, 1 mg, Intravenous, Once PRN, Wyn [place], Marlow BaarsJason S, MD .  Melatonin TABS 10 mg, 10 mg, Oral, QHS, Dew, Marlow BaarsJason S, MD, 10 mg at 05/27/19 2253 .  norepinephrine (LEVOPHED) 4 mg in dextrose 5 % 250 mL (0.016 mg/mL) infusion, 0-40 mcg/min, Intravenous, Titrated, Hallaji, Sheema M, RPH, Last Rate: 3.75 mL/hr at 05/30/19 0300, 1 mcg/min at 05/30/19 0300 .  ondansetron (ZOFRAN) injection 4 mg, 4 mg, Intravenous, Q6H PRN, Annice Needyew, Jason S, MD, 4 mg at 05/28/19 2237 .  pantoprazole (PROTONIX) 80 mg in sodium chloride 0.9 % 250 mL (0.32 mg/mL) infusion, 8 mg/hr, Intravenous, Continuous, Dew, Marlow BaarsJason S, MD, Last Rate: 25 mL/hr at 05/30/19 0300, 8 mg/hr at 05/30/19 0300 .  pantoprazole (PROTONIX) injection 40 mg, 40 mg, Intravenous, Q12H, Dew, Jason S, MD .  piperacillin-tazobactam (ZOSYN) IVPB 3.375 g, 3.375 g, Intravenous, Q8H, Dew, Marlow BaarsJason S, MD, Last Rate: 12.5 mL/hr at 05/30/19 0624, 3.375 g at 05/30/19 0624 .  sodium chloride (PF) 0.9 % injection 10 mL, 10 mL, Other, PRN, Annice Needyew, Jason S, MD, 10 mL at 05/27/19 1450 .  vancomycin (VANCOCIN) IVPB 750 mg/150 ml premix, 750 mg, Intravenous, Q24H, Hallaji, Sheema M, RPH    ALLERGIES   Patient has no known allergies.    REVIEW OF SYSTEMS     10 point ROS done and is negative except as per subjective findings.   PHYSICAL  EXAMINATION   Vitals:   05/30/19 0630 05/30/19 0700  BP: (!) 130/54 (!) 110/51  Pulse: 65 (!) 56  Resp: 18 20  Temp: 98.4 F (36.9 C)   SpO2: 95% 97%    GENERAL:NAD  HEAD: Normocephalic, atraumatic.  EYES: Pupils equal, round, reactive to light.  No scleral icterus.  MOUTH: Moist mucosal membrane. NECK: Supple. No thyromegaly. No nodules. No JVD.  PULMONARY: CTAB CARDIOVASCULAR: S1 and S2. Regular rate and rhythm. No murmurs, rubs, or gallops.  GASTROINTESTINAL: Soft, nontender, non-distended. No masses. Positive bowel sounds. No hepatosplenomegaly.  MUSCULOSKELETAL: No swelling, clubbing, or edema.  NEUROLOGIC: Mild distress due to acute illness SKIN:intact,warm,dry   LABS AND IMAGING       LAB RESULTS: Recent Labs  Lab 05/28/19 0153 05/29/19 0531 05/30/19 0102  NA 138 139 138  K 3.8 3.9 3.8  CL 111 110 110  CO2 22 20*  24  BUN 20 20 23   CREATININE 1.35* 1.27* 1.40*  GLUCOSE 151* 133* 146*   Recent Labs  Lab 05/28/19 0153  05/29/19 0010 05/29/19 0531 05/30/19 0102  HGB 10.1*   < > 10.1* 10.8* 10.5*  HCT 32.8*   < > 32.9* 35.5* 34.5*  WBC 9.8  --   --  11.5* 6.2  PLT 204  --   --  225 220   < > = values in this interval not displayed.     IMAGING RESULTS: No results found.    ASSESSMENT AND PLAN    -Multidisciplinary rounds held today  Peptic ulcer disease  -with necrosis of lesser curverature - s/p GI evaluation - PPI/IVF, ABX -h/h monitoring -NG feeds   Ischemic gastroenteritis  SMA occlusion -vascular surgery on case - appreciate recommendations - s/p SMA stenting - heparin gtt today ICU monitoring    Acute on chronic renal failure -follow chem 7 -follow UO -continue Foley Catheter-assess need daily   ID -continue IV abx as prescibed -follow up cultures    GI/Nutrition GI PROPHYLAXIS as indicated DIET-->TF's as tolerated Constipation protocol as indicated  ENDO - ICU hypoglycemic\Hyperglycemia protocol -check FSBS  per protocol   ELECTROLYTES -follow labs as needed -replace as needed -pharmacy consultation   DVT/GI PRX ordered -SCDs  TRANSFUSIONS AS NEEDED MONITOR FSBS ASSESS the need for LABS as needed   Critical care provider statement:    Critical care time (minutes):  32   Critical care time was exclusive of:  Separately billable procedures and treating other patients   Critical care was necessary to treat or prevent imminent or life-threatening deterioration of the following conditions:  acute ischemic gastroenteritis, peptic ulcer disease, aki, multiple comorbid conditions   Critical care was time spent personally by me on the following activities:  Development of treatment plan with patient or surrogate, discussions with consultants, evaluation of patient's response to treatment, examination of patient, obtaining history from patient or surrogate, ordering and performing treatments and interventions, ordering and review of laboratory studies and re-evaluation of patient's condition.  I assumed direction of critical care for this patient from another provider in my specialty: no    This document was prepared using Dragon voice recognition software and may include unintentional dictation errors.    Vida Rigger, M.D.  Division of Pulmonary & Critical Care Medicine  [facility]

## 2019-05-31 ENCOUNTER — Encounter: Payer: Self-pay | Admitting: Vascular Surgery

## 2019-05-31 DIAGNOSIS — Z9889 Other specified postprocedural states: Secondary | ICD-10-CM

## 2019-05-31 LAB — ALBUMIN: Albumin: 2.5 g/dL — ABNORMAL LOW (ref 3.5–5.0)

## 2019-05-31 LAB — BASIC METABOLIC PANEL
Anion gap: 5 (ref 5–15)
BUN: 19 mg/dL (ref 8–23)
CO2: 24 mmol/L (ref 22–32)
Calcium: 7.9 mg/dL — ABNORMAL LOW (ref 8.9–10.3)
Chloride: 111 mmol/L (ref 98–111)
Creatinine, Ser: 1.1 mg/dL (ref 0.61–1.24)
GFR calc Af Amer: >60 mL/min (ref >60)
GFR calc non Af Amer: >60 mL/min (ref >60)
Glucose, Bld: 102 mg/dL — ABNORMAL HIGH (ref 70–99)
Potassium: 3.7 mmol/L (ref 3.5–5.1)
Sodium: 140 mmol/L (ref 135–145)

## 2019-05-31 LAB — CBC
HCT: 33.8 % — ABNORMAL LOW (ref 39.0–52.0)
Hemoglobin: 10.5 g/dL — ABNORMAL LOW (ref 13.0–17.0)
MCH: 26.8 pg (ref 26.0–34.0)
MCHC: 31.1 g/dL (ref 30.0–36.0)
MCV: 86.2 fL (ref 80.0–100.0)
Platelets: 213 10*3/uL (ref 150–400)
RBC: 3.92 MIL/uL — ABNORMAL LOW (ref 4.22–5.81)
RDW: 25.1 % — ABNORMAL HIGH (ref 11.5–15.5)
WBC: 6.5 10*3/uL (ref 4.0–10.5)
nRBC: 0 % (ref 0.0–0.2)

## 2019-05-31 LAB — HEPARIN LEVEL (UNFRACTIONATED)
Heparin Unfractionated: 0.3 IU/mL (ref 0.30–0.70)
Heparin Unfractionated: 0.55 IU/mL (ref 0.30–0.70)
Heparin Unfractionated: 0.6 IU/mL (ref 0.30–0.70)

## 2019-05-31 LAB — PHOSPHORUS: Phosphorus: 2.3 mg/dL — ABNORMAL LOW (ref 2.5–4.6)

## 2019-05-31 LAB — MAGNESIUM: Magnesium: 2.4 mg/dL (ref 1.7–2.4)

## 2019-05-31 MED ORDER — ENSURE ENLIVE PO LIQD
237.0000 mL | Freq: Three times a day (TID) | ORAL | Status: DC
  Administered 2019-05-31 – 2019-06-01 (×3): 237 mL via ORAL

## 2019-05-31 MED ORDER — BOOST / RESOURCE BREEZE PO LIQD CUSTOM
1.0000 | Freq: Three times a day (TID) | ORAL | Status: DC
  Administered 2019-05-31: 1 via ORAL

## 2019-05-31 MED ORDER — ADULT MULTIVITAMIN W/MINERALS CH
1.0000 | ORAL_TABLET | Freq: Every day | ORAL | Status: DC
  Administered 2019-06-01 – 2019-06-02 (×2): 1 via ORAL
  Filled 2019-05-31 (×2): qty 1

## 2019-05-31 NOTE — Consult Note (Signed)
ANTICOAGULATION CONSULT NOTE - Follow Up Consult  Pharmacy Consult for Heparin infusion Indication: SMA stenosis    No Known Allergies  Patient Measurements: Height: 5\' 9"  (175.3 cm) Weight: 139 lb 1.8 oz (63.1 kg) IBW/kg (Calculated) : 70.7 Heparin Dosing Weight: 63.1 kg   Vital Signs: Temp: 98.3 F (36.8 C) (07/26 2345) Temp Source: Oral (07/26 2345) BP: 114/50 (07/27 0500) Pulse Rate: 51 (07/27 0600)  Labs: Recent Labs    05/29/19 0531 05/29/19 1521 05/30/19 0102  05/30/19 1623 05/30/19 2308 05/31/19 0526  HGB 10.8*  --  10.5*  --   --   --  10.5*  HCT 35.5*  --  34.5*  --   --   --  33.8*  PLT 225  --  220  --   --   --  213  APTT  --  32  --   --   --   --   --   LABPROT  --  15.8*  --   --   --   --   --   INR  --  1.3*  --   --   --   --   --   HEPARINUNFRC  --   --  0.14*   < > 0.23* 0.32 0.30  CREATININE 1.27*  --  1.40*  --   --   --   --    < > = values in this interval not displayed.    Estimated Creatinine Clearance: 40.7 mL/min (A) (by C-G formula based on SCr of 1.4 mg/dL (H)).   Assessment: Patient has not recently had any other anticoagulants. Per chart review, patient is start heparin infusion without a bolus. Patient has a PMH significant for CAD, ischemic cardiomyopathy, hypertension, HFrEF, DM, PAD, subclavian stenosis, PUD, and GIB presenting as direct admit from endoscopy suite with ischemic gastritis status post EGD. Endoscopy noted to have many oozing cratered gastric ulcers with oozing hemorrhage in gastric body.  Hgb 10.8 >> 10.5 Plt 225 >>220   7/26 @ 0102 HL: 0.14. Level is subtherapeutic. Rate increased to 900 units/hr.  7/26 @ 08:30 HL: 0.30. Level is therapeutic. Continued current rate.  7/26 @ 1623 HL: 0.23  Level is subtherapeutic. Confirmed that heparin infusion was not interrupted.  7/26 @ 1623 HL: 0.23  Level is subtherapeutic. Infusion increased to 1000 units/hr 7/26 @ 2308 HL: 0.32. Level is therapeutic x1.    Goal: Heparin Level: 0.30 - 0.70 IU/mL  Plan:  7/27 @ 0526 HL: 0.30. Level is therapeutic x2. Since level is just barely therapeutic will increase infusion to 1050 units/hr.   Recheck confirmatory anti-Xa level in 6 hours and daily while on heparin, per protocol Will closely monitor CBC. Continue to monitor H&H and platelets    Pernell Dupre, PharmD, BCPS Clinical Pharmacist 05/31/2019 6:24 AM

## 2019-05-31 NOTE — Progress Notes (Signed)
[facility] Daily Progress Note   Subjective: 3 Days Post-Op: 1. Ultrasound guidance for vascular access left femoral artery 2. Catheter placement into SMA from left femoral approach 3. Aortogram and selective SMA angiogram 4. Balloon expandable stent placement to the superior mesenteric artery using a 6 mm diameter x 26 mm length lifestream covered balloon expandable stent postdilated with a 7 mm balloon after predilatation with 3, 4, and 5 mm balloons 5. StarClose closure device left femoral artery  Patient without complaint this AM.  Objective: Vitals:   05/31/19 0600 05/31/19 0700 05/31/19 0800 05/31/19 0900  BP: (!) 125/50 (!) 125/48 (!) 142/51 128/66  Pulse: (!) 51 (!) 35 61 (!) 56  Resp: 14 19 (!) 24 14  Temp:   98.8 F (37.1 C)   TempSrc:   Oral   SpO2: 99% 93% 100% 100%  Weight:      Height:        Intake/Output Summary (Last 24 hours) at 05/31/2019 1018 Last data filed at 05/31/2019 0805 Gross per 24 hour  Intake 1745.82 ml  Output 675 ml  Net 1070.82 ml   Physical Exam: A&Ox2, NAD CV: RRR Pulmonary: CTA Bilaterally Abdomen: Soft, Non-tender, Non-distended, (+) Bowel sounds Left Groin:  Access Site: Dressing is clean and dry. No erythema, drainage or swelling noted. Vascular:   Lower Extremities: Warm, Non-tender, Minimal Edema   Laboratory: CBC    Component Value Date/Time   WBC 6.5 05/31/2019 0526   HGB 10.5 (L) 05/31/2019 0526   HGB 10.0 (L) 05/04/2019 1528   HCT 33.8 (L) 05/31/2019 0526   HCT 32.2 (L) 05/04/2019 1528   PLT 213 05/31/2019 0526   PLT 220 05/04/2019 1528   BMET    Component Value Date/Time   NA 140 05/31/2019 0526   NA 138 05/04/2019 1528   K 3.7 05/31/2019 0526   CL 111 05/31/2019 0526   CO2 24 05/31/2019 0526   GLUCOSE 102 (H) 05/31/2019 0526   BUN 19 05/31/2019 0526   BUN 26 05/04/2019 1528   CREATININE 1.10 05/31/2019 0526   CALCIUM  7.9 (L) 05/31/2019 0526   GFRNONAA >60 05/31/2019 0526   GFRAA >60 05/31/2019 0526   Assessment/Planning: The patient is a 78 year old male with multiple medical issues including mesenteric stenosis status post postop day #3 SMA ballooning/stent 1) Patient states he accidentally removed his doboff tube last night due to the smell of "lavender" which made him nauseous. 2) Off pressors. 3) On heparin gtt - Hbg seems to be holding stable. Ideally would like to treat with ASA / Plavix however ASA may not be feasible due to his PUD. Would recommend at least Plavix for stent protection when medically appropriate.   Discussed with Dr. Ellis Parents Shalev Helminiak PA-C 05/31/2019 10:18 AM

## 2019-05-31 NOTE — Consult Note (Signed)
ANTICOAGULATION CONSULT NOTE - Follow Up Consult  Pharmacy Consult for Heparin infusion Indication: SMA stenosis    No Known Allergies  Patient Measurements: Height: 5\' 9"  (175.3 cm) Weight: 139 lb 1.8 oz (63.1 kg) IBW/kg (Calculated) : 70.7 Heparin Dosing Weight: 63.1 kg   Vital Signs: Temp: 98.6 F (37 C) (07/27 1400) Temp Source: Oral (07/27 1400) BP: 120/50 (07/27 1500) Pulse Rate: 61 (07/27 1500)  Labs: Recent Labs    05/29/19 0531 05/29/19 1521 05/30/19 0102  05/30/19 2308 05/31/19 0526 05/31/19 1346  HGB 10.8*  --  10.5*  --   --  10.5*  --   HCT 35.5*  --  34.5*  --   --  33.8*  --   PLT 225  --  220  --   --  213  --   APTT  --  32  --   --   --   --   --   LABPROT  --  15.8*  --   --   --   --   --   INR  --  1.3*  --   --   --   --   --   HEPARINUNFRC  --   --  0.14*   < > 0.32 0.30 0.55  CREATININE 1.27*  --  1.40*  --   --  1.10  --    < > = values in this interval not displayed.    Estimated Creatinine Clearance: 51.8 mL/min (by C-G formula based on SCr of 1.1 mg/dL).   Assessment: Patient has not recently had any other anticoagulants. Per chart review, patient is start heparin infusion without a bolus. Patient has a PMH significant for CAD, ischemic cardiomyopathy, hypertension, HFrEF, DM, PAD, subclavian stenosis, PUD, and GIB presenting as direct admit from endoscopy suite with ischemic gastritis status post EGD. Endoscopy noted to have many oozing cratered gastric ulcers with oozing hemorrhage in gastric body. Heparin currently infusing at 1050 units/hr.    Goal: Heparin Level: 0.30 - 0.70 IU/mL  Plan:  Heparin drip increased this morning despite level being therapeutic. Anti-Xa level increased from 0.3 to 0.55. Will continue heparin drip at 1050 units/hr and obtain follow up anti-Xa level at 2100.   CBC has been ordered with am labs.   Pharmacy will continue to monitor and adjust per consult.    Simpson,Michael L, RPh  05/31/2019 3:08  PM

## 2019-05-31 NOTE — Progress Notes (Addendum)
[facility] [place] NAME: Kevin Case    MR#:  [id number]  DATE OF BIRTH:  1941-04-11  SUBJECTIVE:  CHIEF COMPLAINT:  No chief complaint on file.  -Patient with multiple ischemic gastric ulcers, s/p SMA stent.  - off pressors today. Remains on heparin drip -pulled out dobhoff tube last night  REVIEW OF SYSTEMS:  Review of Systems  Constitutional: Negative for chills, fever and malaise/fatigue.  HENT: Negative for congestion, ear discharge, hearing loss and nosebleeds.   Eyes: Negative for blurred vision and double vision.  Respiratory: Negative for cough, shortness of breath and wheezing.   Cardiovascular: Negative for chest pain and palpitations.  Gastrointestinal: Negative for abdominal pain, constipation, diarrhea, nausea and vomiting.  Genitourinary: Negative for dysuria.  Musculoskeletal: Negative for myalgias.  Neurological: Negative for dizziness, focal weakness, seizures, weakness and headaches.  Psychiatric/Behavioral: Negative for depression.    DRUG ALLERGIES:  No Known Allergies  VITALS:  Blood pressure (!) 170/64, pulse 69, temperature 98.8 F (37.1 C), temperature source Oral, resp. rate (!) 21, height 5\' 9"  (1.753 m), weight 63.1 kg, SpO2 92 %.  PHYSICAL EXAMINATION:  Physical Exam   GENERAL:  78 y.o.-year-old thin built patient lying in the bed with no acute distress.  EYES: Pupils equal, round, reactive to light and accommodation. No scleral icterus. Extraocular muscles intact.  HEENT: Head atraumatic, normocephalic. Oropharynx and nasopharynx clear.  NECK:  Supple, no jugular venous distention. No thyroid enlargement, no tenderness.  LUNGS: Normal breath sounds bilaterally, no wheezing, rales,rhonchi or crepitation. No use of accessory muscles of respiration.  Decreased bibasilar breath sounds CARDIOVASCULAR: S1, S2 normal. No murmurs, rubs, or gallops.  ABDOMEN: Soft, non tender, nondistended. Bowel  sounds present. No organomegaly or mass.  EXTREMITIES: No pedal edema, cyanosis, or clubbing.  NEUROLOGIC: Cranial nerves II through XII are intact. Muscle strength 5/5 in all extremities. Sensation intact. Gait not checked.  PSYCHIATRIC: The patient is alert and oriented x 3.  SKIN: No obvious rash, lesion, or ulcer.    LABORATORY PANEL:   CBC Recent Labs  Lab 05/31/19 0526  WBC 6.5  HGB 10.5*  HCT 33.8*  PLT 213   ------------------------------------------------------------------------------------------------------------------  Chemistries  Recent Labs  Lab 05/29/19 0531  05/31/19 0526  NA 139   < > 140  K 3.9   < > 3.7  CL 110   < > 111  CO2 20*   < > 24  GLUCOSE 133*   < > 102*  BUN 20   < > 19  CREATININE 1.27*   < > 1.10  CALCIUM 8.1*   < > 7.9*  MG 2.2  --  2.4  AST 53*  --   --   ALT 43  --   --   ALKPHOS 80  --   --   BILITOT 1.0  --   --    < > = values in this interval not displayed.   ------------------------------------------------------------------------------------------------------------------  Cardiac Enzymes No results for input(s): TROPONINI in the last 168 hours. ------------------------------------------------------------------------------------------------------------------  RADIOLOGY:  No results found.  EKG:   Orders placed or performed during the hospital encounter of 05/27/19  . EKG 12-Lead  . EKG 12-Lead  . EKG 12-Lead  . EKG 12-Lead    ASSESSMENT AND PLAN:   78 year old male with past medical history significant for subclavian stenosis, PAD, aortic stenosis, ischemic cardiomyopathy, hypertension, chronic combined heart failure, history of gastric ulcers and GI bleed, known history of  iron deficiency anemia admitted after EGD showing several necrotic gastric ulcers.  1.  Hypotension-likely hypovolemic shock -Received IV fluids.  off dopamine and Levophed drips- today -Blood pressure is improved. -No evidence of sepsis.  On  empiric  Zosyn-rule out sepsis- recommend DC ABX, procalcitonin is negative - vancomycin d/ced  2.  Several actively bleeding gastric ulcers-minimal oozing noted on EGD.  -  Hemoglobin is stable.  Did not receive any transfusion this admission -Appreciate GI consult.  Prior history of gastric ulcers as well.  Likely secondary to mild ischemic necrosis from severe celiac and SMA stenosis -Appreciate general surgery consult.  No indication for surgery at this time. -on IV Protonix - Dobhoff tube removed, started clears- per GI- advance to low residue diet as tolerated  3.  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 - on IV heparin drip at this time- change to plavix tomorrow - hold asa given gastric ulcers  4.  CAD and combined CHF-stable at this time.  Continue to monitor closely.  5.  DVT prophylaxis- on IV heparin  Lives at home with his wife.  Independent at baseline Management per ICU team for now Encourage ambulation    All the records are reviewed and case discussed with Care Management/Social Workerr. Management plans discussed with the patient, family and they are in agreement.  CODE STATUS:  Full Code  TOTAL TIME TAKING CARE OF THIS PATIENT: 38 minutes.   POSSIBLE D/C IN 2 DAYS, DEPENDING ON CLINICAL CONDITION.   Enid Baas M.D on 05/31/2019 at 1:48 PM  Between 7am to 6pm - Pager - [phone]  After 6pm go to [email or web address] - Social research officer, government  [facility]  Office  [phone]  CC: Primary care physician; Trey Sailors, PA-C

## 2019-05-31 NOTE — Progress Notes (Signed)
GI doctor notified that patient pulled out dobhoff overnight. GI says it is up to intensivist to insert a new dobhoff or advance diet depending on medical stability. Dr. Lanney Gins notified of this and wants to advance diet to clear liquid.

## 2019-05-31 NOTE — Progress Notes (Signed)
Nutrition Follow-up  DOCUMENTATION CODES:   Not applicable  INTERVENTION:  Provide Ensure Enlive po TID, each supplement provides 350 kcal and 20 grams of protein.  Provide daily MVI.  NUTRITION DIAGNOSIS:   Inadequate oral intake related to acute illness(ischemic gastritis) as evidenced by NPO status.  Resolving - diet has been advanced.  GOAL:   Patient will meet greater than or equal to 90% of their needs  Progressing.  MONITOR:   Labs, Weight trends, TF tolerance, Skin, I & O's  REASON FOR ASSESSMENT:   Consult Enteral/tube feeding initiation and management  ASSESSMENT:   78 y.o. male with recent abdominal pain, nausea, emesis who underwent EGD 7/23 which is concerning for possible ischemic gastritis, complicated by pertinent comorbidities including a multitude of significant comorbidities including PUD, NSTEMI, CHF, DM, debilitation, and advanced age.   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  Patient was started on tube feeds per DHT on 7/25 and tube feeds were advanced to goal. Last night patient pulled out his DHT. Diet was advanced to clear liquids earlier today and noted have now been advanced to full liquids. Will monitor tolerance and adequacy of intake.  Medications reviewed and include: Colace, Melatonin, pantoprazole, sennosides, heparin gtt, Zosyn.  Labs reviewed: Phosphorus 2.3.  Diet Order:   Diet Order            Diet full liquid Room service appropriate? Yes; Fluid consistency: Thin  Diet effective now             EDUCATION NEEDS:   Not appropriate for education at this time  Skin:  Skin Assessment: Reviewed RN Assessment  Last BM:  05/31/2019 - large type 7  Height:   Ht Readings from Last 1 Encounters:  05/27/19 5\' 9"  (1.753 m)   Weight:   Wt Readings from Last 1 Encounters:  05/27/19 63.1 kg   Ideal Body Weight:  72.7 kg  BMI:  Body mass index is 20.54 kg/m.  Estimated Nutritional Needs:    Kcal:  1700-2000kcal/day  Protein:  85-100g/day  Fluid:  >1.6L/day  Willey Blade, MS, RD, LDN Office: [phone] Pager: [phone] After Hours/Weekend Pager: [phone]

## 2019-05-31 NOTE — Progress Notes (Addendum)
Daily Progress Note   Patient Name: Kevin Case       Date: 05/31/2019 DOB: 1941/03/16  Age: 78 y.o. MRN#: [id number] Attending Physician: Gladstone Lighter, MD Primary Care Physician: Paulene Floor Admit Date: 05/27/2019  Reason for Consultation/Follow-up: Establishing goals of care  Subjective: Patient is resting in bed. He states he has discussed the contents of our previous conversation with his wife over the weekend. He states he and his wife have exactly the same plans and living will. He states as long as his pain can be controlled, he wants to proceed forth with all care possible including CPR. He states he wants 1 round of CPR and vent support for 1.5 days. His wife called during our conversation to speak with him, and per their discussion, he revised his wishes to want ventilator support for 1-2 weeks, and his wife is comfortable with making the decisions to withdraw care if needed. He states "she says I want resuscitation. I want to be a full code." He approved of me calling his wife.  Spoke with [name]. She states her husband is strong, but not as strong as he thinks he is. She states she pays someone to do yard work for their 7 acres. He is unable to maintain the pool easily. We recapped the [place]. She confirms agreement with the above. We discussed QOL. She states for the past few months, she has bathed him and cleaned him when he loses bowel control, and states he does not like needing the assistance, but allows it, and she will continue to care for him as it helps to keep her strong. He states she is a realist and understands his situation; hey would like to continue all care possible at this time.   She states the doctors are calling every other day, and she would  really appreciate daily calls since she is unable to be here with him. She states "if it's just 2 minutes, any time of day or night, the phone lines are open." She tells me of further plans for carotid endarterectomy and hopeful surgery to his stomach "like Rico Ala had" if he becomes srtong enough. She is taking one day at a time.        Length of Stay: 4  Current Medications: Scheduled Meds:  . atorvastatin  80 mg Oral QHS  . Chlorhexidine Gluconate Cloth  6 each Topical Q0600  . docusate  100 mg Per Tube Daily  . feeding supplement (PRO-STAT SUGAR FREE 64)  30 mL Per Tube Daily  . free water  75 mL Per Tube Q4H  . Melatonin  10 mg Oral QHS  . pantoprazole  40 mg Intravenous Q12H  . sennosides  5 mL Per Tube QHS    Continuous Infusions: . sodium chloride Stopped (05/31/19 0643)  . feeding supplement (OSMOLITE 1.5 CAL) Stopped (05/31/19 0200)  . heparin 1,050 Units/hr (05/31/19 0805)  . piperacillin-tazobactam (ZOSYN)  IV 12.5 mL/hr at 05/31/19 0805    PRN Meds: sodium chloride, HYDROmorphone (DILAUDID) injection, ondansetron (ZOFRAN) IV, sodium chloride (PF)  Physical Exam HENT:     Head: Normocephalic.  Pulmonary:     Effort: Pulmonary effort is normal.  Neurological:     Mental Status: He is alert.     Comments: Oriented             Vital Signs: BP (!) 128/55   Pulse (!) 56   Temp 98.8 F (37.1 C) (Oral)   Resp 17   Ht 5\' 9"  (1.753 m)   Wt 63.1 kg   SpO2 100%   BMI 20.54 kg/m  SpO2: SpO2: 100 % O2 Device: O2 Device: Room Air O2 Flow Rate: O2 Flow Rate (L/min): 2 L/min  Intake/output summary:   Intake/Output Summary (Last 24 hours) at 05/31/2019 1058 Last data filed at 05/31/2019 0805 Gross per 24 hour  Intake 1745.82 ml  Output 500 ml  Net 1245.82 ml   LBM: Last BM Date: 05/31/19 Baseline Weight: Weight: 69.4 kg Most recent weight: Weight: 63.1 kg       Palliative Assessment/Data: Ice chips      Patient Active Problem List   Diagnosis  Date Noted  . PUD (peptic ulcer disease)   . Ischemic gastroenteritis (HCC)   . Type 2 diabetes mellitus with stage 3 chronic kidney disease, without long-term current use of insulin (HCC) 05/06/2019  . GI bleed 04/02/2019  . Hypotension 04/02/2019  . Stenosis of left subclavian artery (HCC) 03/31/2019  . Non-ST elevation (NSTEMI) myocardial infarction (HCC)   . Acute systolic heart failure (HCC)   . Acute respiratory failure with hypoxia (HCC) 03/12/2019    Palliative Care Assessment & Plan     Recommendations/Plan:  Continue full code/full scope  Would want ventilator for 1-2 weeks if needed.   Would be amenable to a feeding tube.     Code Status:    Code Status Orders  (From admission, onward)         Start     Ordered   05/27/19 1342  Full code  Continuous     05/27/19 1341        Code Status History    Date Active Date Inactive Code Status Order ID Comments User Context   05/27/2019 1037 05/27/2019 1341 DNR [id number]  Jimmye Norman, NP Inpatient   05/16/2019 2046 05/19/2019 1911 Full Code [id number]  Houston Siren, MD Inpatient   03/31/2019 1133 04/02/2019 1554 Full Code [id number]  Iran Ouch, MD Inpatient   03/12/2019 2038 03/16/2019 1519 Partial Code [id number]  Mayo, Allyn Kenner, MD Inpatient   Advance Care Planning Activity    Advance Directive Documentation     Most Recent Value  Type of Advance Directive  Living will  Pre-existing out of facility DNR order (yellow form or pink MOST form)  -  "  MOST" Form in Place?  -       Prognosis:   Poor overall. Ischemic gastroenteritis. S/p SMA angiogram with stent placement. HF EF 30-35%.   Discharge Planning:  To Be Determined   Thank you for allowing the Palliative Medicine Team to assist in the care of this patient.   Time In: 9:50 Time Out: 11:10 Total Time 1 hour 20 min Prolonged Time Billed yes      Greater than 50%  of this time was spent counseling and coordinating care related to  the above assessment and plan.  Morton Stallrystal Kristofor Michalowski, NP  Please contact Palliative Medicine Team phone at [phone] for questions and concerns.

## 2019-05-31 NOTE — Progress Notes (Signed)
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  -Patient removed Dobbhoff tube, discussed with GI able to advance diet to p.o., currently on liquid diet.  -Continue on heparin drip with no grossly appreciable bleeding.   -Stopping empiric vancomycin and continue with Zosyn.  -Hemodynamically stable off of vasopressors and inotropes   PAST MEDICAL HISTORY   Past Medical History:  Diagnosis Date  . Anemia   . Blood transfusion without reported diagnosis   . CAD (coronary artery disease)    a. 03/2019 Cath: LM nl, LAD 50p CA2+, 58m, D1 50ost, LCX 20p, 69m, RCA small, mild diff dzs-->Med rx. Consider PCI LCX for refractory angina.  . Carotid arterial disease ([place])    a. 03/2019 Carotid U/S: RICA 4-48%, RECA <18%, LICA 5-63%, LECA <14%.  . CHF (congestive heart failure) ([place])   . Clotting disorder ([place])   . Diabetes mellitus without complication ([place])   . GIB (gastrointestinal bleeding)    a. 03/2019 BRBPR following PV procedure req anticoagulation-->2u PRBCs.  . Heart murmur   . HFrEF (heart failure with reduced ejection fraction) ([place])    a. 03/2019 Echo: EF 30-35%, mild conc LVH. Mildly dil LA. Mod MV annular dil. Mod AS (mean grad 75mmHg, Valve area 0.76).  Marland Kitchen History of tobacco abuse   . Hypertension   . Ischemic cardiomyopathy    a. 03/2019 Echo: EF 30-35%.  . Left bundle branch block   . Moderate aortic stenosis    a. 03/2019 Echo:  Mod AS (mean grad 87mmHg, Valve area 0.76).  Marland Kitchen PAD (peripheral artery disease) ([place])    a. 03/2019 PV Angio: RCFA 80, RSFA 100.  . Rib fracture   . Sinus bradycardia   . Subclavian arterial stenosis  ([place])    a. 03/2019 PV Angio: RSCA 90 after origin of CCA. RCCA 30ost, LSCA 80 (PTA and covered stenting).     SURGICAL HISTORY   Past Surgical History:  Procedure Laterality Date  . AORTIC ARCH ANGIOGRAPHY N/A 03/31/2019   Procedure: AORTIC ARCH ANGIOGRAPHY;  Surgeon: Wellington Hampshire, MD;  Location: [facility];  Service: Cardiovascular;  Laterality: N/A;  . CARDIAC CATHETERIZATION    . ESOPHAGOGASTRODUODENOSCOPY (EGD) WITH PROPOFOL N/A 05/27/2019   Procedure: ESOPHAGOGASTRODUODENOSCOPY (EGD) WITH PROPOFOL;  Surgeon: Lin Landsman, MD;  Location: [facility];  Service: Gastroenterology;  Laterality: N/A;  . PERIPHERAL VASCULAR INTERVENTION Left 03/31/2019   Procedure: PERIPHERAL VASCULAR INTERVENTION;  Surgeon: Wellington Hampshire, MD;  Location: [facility];  Service: Cardiovascular;  Laterality: Left;  . RIGHT/LEFT HEART CATH AND CORONARY ANGIOGRAPHY N/A 03/15/2019   Procedure: RIGHT/LEFT HEART CATH AND CORONARY ANGIOGRAPHY;  Surgeon: Wellington Hampshire, MD;  Location: [facility];  Service: Cardiovascular;  Laterality: N/A;  . VISCERAL ANGIOGRAPHY N/A 05/28/2019   Procedure: VISCERAL ANGIOGRAPHY;  Surgeon: Algernon Huxley, MD;  Location: [facility];  Service: Cardiovascular;  Laterality: N/A;     FAMILY HISTORY   Family History  Problem Relation Age of Onset  . Congestive Heart Failure Father        had AICD placed.   . Dementia Father   . Dementia Mother      SOCIAL HISTORY   Social History   Tobacco Use  . Smoking status: Former Smoker    Packs/day: 1.50  Years: 57.00    Pack years: 85.50    Types: Cigarettes  . Smokeless tobacco: Never Used  Substance Use Topics  . Alcohol use: No    Frequency: Never  . Drug use: Never     MEDICATIONS   Current Medication:  Current Facility-Administered Medications:  .  0.9 %  sodium chloride infusion, , Intravenous, PRN, Vida Rigger, MD, Stopped at 05/31/19 [phone] .  atorvastatin  (LIPITOR) tablet 80 mg, 80 mg, Oral, QHS, Annice Needy, MD, 80 mg at 05/27/19 2131 .  Chlorhexidine Gluconate Cloth 2 % PADS 6 each, 6 each, Topical, Q0600, Annice Needy, MD, 6 each at 05/31/19 0245 .  docusate (COLACE) 50 MG/5ML liquid 100 mg, 100 mg, Per Tube, Daily, Eugenie Norrie, NP, 100 mg at 05/30/19 2039 .  DOPamine (INTROPIN) 800 mg in dextrose 5 % 250 mL (3.2 mg/mL) infusion, 2.5 mcg/kg/min, Intravenous, Titrated, Dew, Marlow Baars, MD, Stopped at 05/30/19 1112 .  feeding supplement (OSMOLITE 1.5 CAL) liquid 1,000 mL, 1,000 mL, Per Tube, Continuous, Enid Baas, MD, Stopped at 05/31/19 0200 .  feeding supplement (PRO-STAT SUGAR FREE 64) liquid 30 mL, 30 mL, Per Tube, Daily, Nemiah Commander, Radhika, MD, 30 mL at 05/30/19 1045 .  free water 75 mL, 75 mL, Per Tube, Q4H, Annice Needy, MD, 75 mL at 05/30/19 2351 .  heparin ADULT infusion 100 units/mL (25000 units/277mL sodium chloride 0.45%), 1,050 Units/hr, Intravenous, Continuous, Hallaji, Sheema M, RPH, Last Rate: 10.5 mL/hr at 05/31/19 0659, 1,050 Units/hr at 05/31/19 0659 .  HYDROmorphone (DILAUDID) injection 1 mg, 1 mg, Intravenous, Once PRN, Wyn Quaker, Marlow Baars, MD .  Melatonin TABS 10 mg, 10 mg, Oral, QHS, Dew, Marlow Baars, MD, 10 mg at 05/27/19 2253 .  norepinephrine (LEVOPHED) 4 mg in dextrose 5 % 250 mL (0.016 mg/mL) infusion, 0-40 mcg/min, Intravenous, Titrated, Hallaji, Mardene Speak, RPH, Stopped at 05/30/19 0434 .  ondansetron (ZOFRAN) injection 4 mg, 4 mg, Intravenous, Q6H PRN, Annice Needy, MD, 4 mg at 05/28/19 2237 .  pantoprazole (PROTONIX) injection 40 mg, 40 mg, Intravenous, Q12H, Dew, Marlow Baars, MD, 40 mg at 05/30/19 2055 .  piperacillin-tazobactam (ZOSYN) IVPB 3.375 g, 3.375 g, Intravenous, Q8H, Dew, Marlow Baars, MD, Last Rate: 12.5 mL/hr at 05/31/19 0659 .  sennosides (SENOKOT) 8.8 MG/5ML syrup 5 mL, 5 mL, Per Tube, QHS, Eugenie Norrie, NP, 5 mL at 05/30/19 2101 .  sodium chloride (PF) 0.9 % injection 10 mL, 10 mL, Other, PRN, Annice Needy,  MD, 10 mL at 05/27/19 1450 .  vancomycin (VANCOCIN) IVPB 750 mg/150 ml premix, 750 mg, Intravenous, Q24H, Hallaji, Sheema M, RPH, Last Rate: 150 mL/hr at 05/30/19 1423, 750 mg at 05/30/19 1423    ALLERGIES   Patient has no known allergies.    REVIEW OF SYSTEMS     10 point ROS done and is negative except as per subjective findings.   PHYSICAL EXAMINATION   Vitals:   05/31/19 0600 05/31/19 0700  BP: (!) 125/50 (!) 125/48  Pulse: (!) 51 (!) 35  Resp: 14 19  Temp:    SpO2: 99% 93%    GENERAL:NAD  HEAD: Normocephalic, atraumatic.  EYES: Pupils equal, round, reactive to light.  No scleral icterus.  MOUTH: Moist mucosal membrane. NECK: Supple. No thyromegaly. No nodules. No JVD.  PULMONARY: CTAB CARDIOVASCULAR: S1 and S2. Regular rate and rhythm. No murmurs, rubs, or gallops.  GASTROINTESTINAL: Soft, nontender, non-distended. No masses. Positive bowel sounds. No hepatosplenomegaly.  MUSCULOSKELETAL: No swelling, clubbing, or  edema.  NEUROLOGIC: Mild distress due to acute illness SKIN:intact,warm,dry   LABS AND IMAGING       LAB RESULTS: Recent Labs  Lab 05/29/19 0531 05/30/19 0102 05/31/19 0526  NA 139 138 140  K 3.9 3.8 3.7  CL 110 110 111  CO2 20* 24 24  BUN 20 23 19   CREATININE 1.27* 1.40* 1.10  GLUCOSE 133* 146* 102*   Recent Labs  Lab 05/29/19 0531 05/30/19 0102 05/31/19 0526  HGB 10.8* 10.5* 10.5*  HCT 35.5* 34.5* 33.8*  WBC 11.5* 6.2 6.5  PLT 225 220 213     IMAGING RESULTS: No results found.    ASSESSMENT AND PLAN    -Multidisciplinary rounds held today  Peptic ulcer disease  -with necrosis of lesser curverature - s/p GI evaluation - PPI/IVF, ABX -h/h monitoring -Remove Dobbhoff tube now is on liquid diet p.o.   Ischemic gastroenteritis  SMA occlusion -vascular surgery on case - appreciate recommendations - s/p SMA stenting - heparin gtt today ICU monitoring    Acute on chronic renal failure -follow chem 7 -follow UO  -continue Foley Catheter-assess need daily   ID -continue IV abx as prescibed -follow up cultures    GI/Nutrition GI PROPHYLAXIS as indicated DIET-->TF's as tolerated Constipation protocol as indicated  ENDO - ICU hypoglycemic\Hyperglycemia protocol -check FSBS per protocol   ELECTROLYTES -follow labs as needed -replace as needed -pharmacy consultation   DVT/GI PRX ordered -SCDs  TRANSFUSIONS AS NEEDED MONITOR FSBS ASSESS the need for LABS as needed   Critical care provider statement:    Critical care time (minutes):  33   Critical care time was exclusive of:  Separately billable procedures and treating other patients   Critical care was necessary to treat or prevent imminent or life-threatening deterioration of the following conditions:  acute ischemic gastroenteritis, peptic ulcer disease, aki, multiple comorbid conditions   Critical care was time spent personally by me on the following activities:  Development of treatment plan with patient or surrogate, discussions with consultants, evaluation of patient's response to treatment, examination of patient, obtaining history from patient or surrogate, ordering and performing treatments and interventions, ordering and review of laboratory studies and re-evaluation of patient's condition.  I assumed direction of critical care for this patient from another provider in my specialty: no    This document was prepared using Dragon voice recognition software and may include unintentional dictation errors.    Vida RiggerFuad Tajh Livsey, M.D.  Division of Pulmonary & Critical Care Medicine  [facility]

## 2019-05-31 NOTE — Progress Notes (Signed)
Patient woke up confused, needing to have a BM. While patient up to [facility] for BM, he pulled his dobhoff tube out. Tube feeds stopped. Patient bathed and linens changed.

## 2019-05-31 NOTE — Progress Notes (Signed)
ANTICOAGULATION CONSULT NOTE - Initial Consult  Pharmacy Consult for Heparin  Indication: SMA stenosis  No Known Allergies  Patient Measurements: Height: 5\' 9"  (175.3 cm) Weight: 139 lb 1.8 oz (63.1 kg) IBW/kg (Calculated) : 70.7 Heparin Dosing Weight:  63.1 kg   Vital Signs: Temp: 98.4 F (36.9 C) (07/27 2000) Temp Source: Oral (07/27 2000) BP: 123/48 (07/27 2100) Pulse Rate: 54 (07/27 2100)  Labs: Recent Labs    05/29/19 0531 05/29/19 1521 05/30/19 0102  05/31/19 0526 05/31/19 1346 05/31/19 2046  HGB 10.8*  --  10.5*  --  10.5*  --   --   HCT 35.5*  --  34.5*  --  33.8*  --   --   PLT 225  --  220  --  213  --   --   APTT  --  32  --   --   --   --   --   LABPROT  --  15.8*  --   --   --   --   --   INR  --  1.3*  --   --   --   --   --   HEPARINUNFRC  --   --  0.14*   < > 0.30 0.55 0.60  CREATININE 1.27*  --  1.40*  --  1.10  --   --    < > = values in this interval not displayed.    Estimated Creatinine Clearance: 51.8 mL/min (by C-G formula based on SCr of 1.1 mg/dL).   Medical History: Past Medical History:  Diagnosis Date  . Anemia   . Blood transfusion without reported diagnosis   . CAD (coronary artery disease)    a. 03/2019 Cath: LM nl, LAD 50p CA2+, 82m, D1 50ost, LCX 20p, 18m, RCA small, mild diff dzs-->Med rx. Consider PCI LCX for refractory angina.  . Carotid arterial disease ([place])    a. 03/2019 Carotid U/S: RICA 4-23%, RECA <53%, LICA 6-14%, LECA <43%.  . CHF (congestive heart failure) ([place])   . Clotting disorder ([place])   . Diabetes mellitus without complication ([place])   . GIB (gastrointestinal bleeding)    a. 03/2019 BRBPR following PV procedure req anticoagulation-->2u PRBCs.  . Heart murmur   . HFrEF (heart failure with reduced ejection fraction) ([place])    a. 03/2019 Echo: EF 30-35%, mild conc LVH. Mildly dil LA. Mod MV annular dil. Mod AS (mean grad 77mmHg, Valve area 0.76).  Marland Kitchen History of tobacco abuse   . Hypertension   . Ischemic  cardiomyopathy    a. 03/2019 Echo: EF 30-35%.  . Left bundle branch block   . Moderate aortic stenosis    a. 03/2019 Echo:  Mod AS (mean grad 16mmHg, Valve area 0.76).  Marland Kitchen PAD (peripheral artery disease) ([place])    a. 03/2019 PV Angio: RCFA 80, RSFA 100.  . Rib fracture   . Sinus bradycardia   . Subclavian arterial stenosis ([place])    a. 03/2019 PV Angio: RSCA 90 after origin of CCA. RCCA 30ost, LSCA 80 (PTA and covered stenting).    Medications:  Medications Prior to Admission  Medication Sig Dispense Refill Last Dose  . acetaminophen (TYLENOL) 500 MG tablet Take 1,000 mg by mouth every 6 (six) hours as needed (headache.).   Past Week at Unknown time  . aspirin 81 MG chewable tablet Chew 1 tablet (81 mg total) by mouth daily. 30 tablet 0 05/26/2019 at Unknown time  . atorvastatin (LIPITOR) 80 MG tablet  Take 80 mg by mouth at bedtime.    05/26/2019 at Unknown time  . cyanocobalamin 100 MCG tablet Take 100 mcg by mouth daily.   Past Week at Unknown time  . docusate sodium (COLACE) 100 MG capsule Take 100 mg by mouth daily.    Past Week at Unknown time  . ferrous sulfate 325 (65 FE) MG EC tablet Take 1 tablet (325 mg total) by mouth 2 (two) times daily. 60 tablet 3 Past Week at Unknown time  . furosemide (LASIX) 20 MG tablet Take 0.5 tablets (10 mg total) by mouth daily. 90 tablet 3 05/27/2019 at 0500  . JARDIANCE 10 MG TABS tablet Take 10 mg by mouth daily. 900 mg 0 Past Week at Unknown time  . losartan (COZAAR) 25 MG tablet Take 0.5 tablets (12.5 mg total) by mouth daily. 45 tablet 3 05/27/2019 at 0500  . Melatonin 10 MG TABS Take 10 mg by mouth at bedtime.    Past Week at Unknown time  . Multiple Vitamin (MULTIVITAMIN WITH MINERALS) TABS tablet Take 1 tablet by mouth daily. Multivitamin for Senior 50+   Past Week at Unknown time  . Omega-3 Fatty Acids (FISH OIL) 1000 MG CAPS Take 1,000 mg by mouth daily.   Past Week at Unknown time  . omeprazole (PRILOSEC) 40 MG capsule Take 1 capsule (40 mg total)  by mouth 2 (two) times a day. 60 capsule 0 Past Week at Unknown time  . traMADol (ULTRAM) 50 MG tablet Take 1 tablet (50 mg total) by mouth every 6 (six) hours as needed. (Patient taking differently: Take 50 mg by mouth every 6 (six) hours as needed (pain). ) 8 tablet 0 Past Week at Unknown time    Assessment: Patient has not recently had any other anticoagulants. Per chart review, patient is start heparin infusion without a bolus. Patient has a PMH significant for CAD, ischemic cardiomyopathy, hypertension, HFrEF, DM, PAD, subclavian stenosis, PUD, and GIB presenting as direct admit from endoscopy suite with ischemic gastritis status post EGD. Endoscopy noted to have many oozing cratered gastric ulcers with oozing hemorrhage in gastric body. Heparin currently infusing at 1050 units/hr.    Goal: Heparin Level: 0.30 - 0.70 IU/mL  Plan:  Heparin drip increased this morning despite level being therapeutic. Anti-Xa level increased from 0.3 to 0.55. Will continue heparin drip at 1050 units/hr and obtain follow up anti-Xa level at 2100.   7/27:  HL @ 2046 = 0.6 Will continue pt on current rate and recheck HL on 7/28 with AM labs.   Shiryl Ruddy D 05/31/2019,9:36 PM

## 2019-05-31 NOTE — Progress Notes (Signed)
Pharmacy Antibiotic Note  Kevin Case is a 78 y.o. male admitted on 05/27/2019 with ischemic gastroenteritis.  Pharmacy has been consulted for Zosyn dosing.  Plan: Continue Zosyn 3.375g IV q8h extended infusion.   Height: 5\' 9"  (175.3 cm) Weight: 139 lb 1.8 oz (63.1 kg) IBW/kg (Calculated) : 70.7  Temp (24hrs), Avg:98.5 F (36.9 C), Min:98.2 F (36.8 C), Max:98.8 F (37.1 C)  Recent Labs  Lab 05/27/19 1215 05/27/19 1958 05/28/19 0153 05/29/19 0531 05/30/19 0102 05/31/19 0526  WBC  --  7.3 9.8 11.5* 6.2 6.5  CREATININE 1.38*  --  1.35* 1.27* 1.40* 1.10  LATICACIDVEN  --   --  0.8  --   --   --     Estimated Creatinine Clearance: 51.8 mL/min (by C-G formula based on SCr of 1.1 mg/dL).    No Known Allergies  Antimicrobials this admission: Vancomycin 7/23 >> 7/26 Zosyn 7/23 >>   Microbiology: 7/23 Blood cx: No growth x 4 days  7/23 Urine cx: insignificant growth  7/23 MRSA PCR: negative 7/24 SARS Coronavirus 2 negative  Thank you for allowing pharmacy to be a part of this patient's care.  MLS 05/31/2019 4:36 PM

## 2019-05-31 NOTE — Progress Notes (Signed)
NP notified that patient pulled out dobhoff tube; acknowledged. No new orders at present.

## 2019-06-01 ENCOUNTER — Encounter: Payer: Self-pay | Admitting: Physician Assistant

## 2019-06-01 ENCOUNTER — Telehealth: Payer: Self-pay | Admitting: Gastroenterology

## 2019-06-01 LAB — PHOSPHORUS: Phosphorus: 2.6 mg/dL (ref 2.5–4.6)

## 2019-06-01 LAB — BASIC METABOLIC PANEL
Anion gap: 7 (ref 5–15)
BUN: 17 mg/dL (ref 8–23)
CO2: 22 mmol/L (ref 22–32)
Calcium: 8 mg/dL — ABNORMAL LOW (ref 8.9–10.3)
Chloride: 109 mmol/L (ref 98–111)
Creatinine, Ser: 1.14 mg/dL (ref 0.61–1.24)
GFR calc Af Amer: >60 mL/min (ref >60)
GFR calc non Af Amer: >60 mL/min (ref >60)
Glucose, Bld: 102 mg/dL — ABNORMAL HIGH (ref 70–99)
Potassium: 3.8 mmol/L (ref 3.5–5.1)
Sodium: 138 mmol/L (ref 135–145)

## 2019-06-01 LAB — CBC
HCT: 32.3 % — ABNORMAL LOW (ref 39.0–52.0)
Hemoglobin: 10 g/dL — ABNORMAL LOW (ref 13.0–17.0)
MCH: 27.1 pg (ref 26.0–34.0)
MCHC: 31 g/dL (ref 30.0–36.0)
MCV: 87.5 fL (ref 80.0–100.0)
Platelets: 198 10*3/uL (ref 150–400)
RBC: 3.69 MIL/uL — ABNORMAL LOW (ref 4.22–5.81)
RDW: 25.2 % — ABNORMAL HIGH (ref 11.5–15.5)
WBC: 5.6 10*3/uL (ref 4.0–10.5)
nRBC: 0 % (ref 0.0–0.2)

## 2019-06-01 LAB — CULTURE, BLOOD (ROUTINE X 2)
Culture: NO GROWTH
Culture: NO GROWTH
Special Requests: ADEQUATE
Special Requests: ADEQUATE

## 2019-06-01 LAB — HEPARIN LEVEL (UNFRACTIONATED): Heparin Unfractionated: 0.55 IU/mL (ref 0.30–0.70)

## 2019-06-01 MED ORDER — SENNOSIDES 8.8 MG/5ML PO SYRP
5.0000 mL | ORAL_SOLUTION | Freq: Every day | ORAL | Status: DC
  Filled 2019-06-01 (×2): qty 5

## 2019-06-01 MED ORDER — CLOPIDOGREL BISULFATE 75 MG PO TABS
75.0000 mg | ORAL_TABLET | Freq: Every day | ORAL | Status: DC
  Administered 2019-06-01 – 2019-06-02 (×2): 75 mg via ORAL
  Filled 2019-06-01 (×2): qty 1

## 2019-06-01 MED ORDER — PANTOPRAZOLE SODIUM 40 MG PO TBEC
40.0000 mg | DELAYED_RELEASE_TABLET | Freq: Two times a day (BID) | ORAL | Status: DC
  Administered 2019-06-01 – 2019-06-02 (×2): 40 mg via ORAL
  Filled 2019-06-01 (×2): qty 1

## 2019-06-01 MED ORDER — ENOXAPARIN SODIUM 40 MG/0.4ML ~~LOC~~ SOLN
40.0000 mg | Freq: Every day | SUBCUTANEOUS | Status: DC
  Administered 2019-06-01 – 2019-06-02 (×2): 40 mg via SUBCUTANEOUS
  Filled 2019-06-01 (×2): qty 0.4

## 2019-06-01 NOTE — Progress Notes (Signed)
ANTICOAGULATION CONSULT NOTE - Initial Consult  Pharmacy Consult for Heparin  Indication: SMA stenosis  No Known Allergies  Patient Measurements: Height: 5\' 9"  (175.3 cm) Weight: 139 lb 1.8 oz (63.1 kg) IBW/kg (Calculated) : 70.7 Heparin Dosing Weight:  63.1 kg   Vital Signs: Temp: 98.1 F (36.7 C) (07/28 0200) Temp Source: Oral (07/28 0200) BP: 137/47 (07/28 0600) Pulse Rate: 47 (07/28 0600)  Labs: Recent Labs    05/29/19 1521  05/30/19 0102  05/31/19 0526 05/31/19 1346 05/31/19 2046 06/01/19 0439  HGB  --    < > 10.5*  --  10.5*  --   --  10.0*  HCT  --   --  34.5*  --  33.8*  --   --  32.3*  PLT  --   --  220  --  213  --   --  198  APTT 32  --   --   --   --   --   --   --   LABPROT 15.8*  --   --   --   --   --   --   --   INR 1.3*  --   --   --   --   --   --   --   HEPARINUNFRC  --   --  0.14*   < > 0.30 0.55 0.60 0.55  CREATININE  --   --  1.40*  --  1.10  --   --  1.14   < > = values in this interval not displayed.    Estimated Creatinine Clearance: 50 mL/min (by C-G formula based on SCr of 1.14 mg/dL).   Medical History: Past Medical History:  Diagnosis Date  . Anemia   . Blood transfusion without reported diagnosis   . CAD (coronary artery disease)    a. 03/2019 Cath: LM nl, LAD 50p CA2+, 54m, D1 50ost, LCX 20p, 36m, RCA small, mild diff dzs-->Med rx. Consider PCI LCX for refractory angina.  . Carotid arterial disease ([place])    a. 03/2019 Carotid U/S: RICA 8-31%, RECA <51%, LICA 7-61%, LECA <60%.  . CHF (congestive heart failure) ([place])   . Clotting disorder ([place])   . Diabetes mellitus without complication ([place])   . GIB (gastrointestinal bleeding)    a. 03/2019 BRBPR following PV procedure req anticoagulation-->2u PRBCs.  . Heart murmur   . HFrEF (heart failure with reduced ejection fraction) ([place])    a. 03/2019 Echo: EF 30-35%, mild conc LVH. Mildly dil LA. Mod MV annular dil. Mod AS (mean grad 6mmHg, Valve area 0.76).  Marland Kitchen History of tobacco abuse    . Hypertension   . Ischemic cardiomyopathy    a. 03/2019 Echo: EF 30-35%.  . Left bundle branch block   . Moderate aortic stenosis    a. 03/2019 Echo:  Mod AS (mean grad 5mmHg, Valve area 0.76).  Marland Kitchen PAD (peripheral artery disease) ([place])    a. 03/2019 PV Angio: RCFA 80, RSFA 100.  . Rib fracture   . Sinus bradycardia   . 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 03/2019 PV Angio: RSCA 90 after origin of CCA. RCCA 30ost, LSCA 80 (PTA and covered stenting).    Medications:  Medications Prior to Admission  Medication Sig Dispense Refill Last Dose  . acetaminophen (TYLENOL) 500 MG tablet Take 1,000 mg by mouth every 6 (six) hours as needed (headache.).   Past Week at Unknown time  . aspirin 81 MG chewable  tablet Chew 1 tablet (81 mg total) by mouth daily. 30 tablet 0 05/26/2019 at Unknown time  . atorvastatin (LIPITOR) 80 MG tablet Take 80 mg by mouth at bedtime.    05/26/2019 at Unknown time  . cyanocobalamin 100 MCG tablet Take 100 mcg by mouth daily.   Past Week at Unknown time  . docusate sodium (COLACE) 100 MG capsule Take 100 mg by mouth daily.    Past Week at Unknown time  . ferrous sulfate 325 (65 FE) MG EC tablet Take 1 tablet (325 mg total) by mouth 2 (two) times daily. 60 tablet 3 Past Week at Unknown time  . furosemide (LASIX) 20 MG tablet Take 0.5 tablets (10 mg total) by mouth daily. 90 tablet 3 05/27/2019 at 0500  . JARDIANCE 10 MG TABS tablet Take 10 mg by mouth daily. 900 mg 0 Past Week at Unknown time  . losartan (COZAAR) 25 MG tablet Take 0.5 tablets (12.5 mg total) by mouth daily. 45 tablet 3 05/27/2019 at 0500  . Melatonin 10 MG TABS Take 10 mg by mouth at bedtime.    Past Week at Unknown time  . Multiple Vitamin (MULTIVITAMIN WITH MINERALS) TABS tablet Take 1 tablet by mouth daily. Multivitamin for Senior 50+   Past Week at Unknown time  . Omega-3 Fatty Acids (FISH OIL) 1000 MG CAPS Take 1,000 mg by mouth daily.   Past Week at Unknown time  . omeprazole (PRILOSEC) 40 MG capsule  Take 1 capsule (40 mg total) by mouth 2 (two) times a day. 60 capsule 0 Past Week at Unknown time  . traMADol (ULTRAM) 50 MG tablet Take 1 tablet (50 mg total) by mouth every 6 (six) hours as needed. (Patient taking differently: Take 50 mg by mouth every 6 (six) hours as needed (pain). ) 8 tablet 0 Past Week at Unknown time    Assessment: Patient has not recently had any other anticoagulants. Per chart review, patient is start heparin infusion without a bolus. Patient has a PMH significant for CAD, ischemic cardiomyopathy, hypertension, HFrEF, DM, PAD, subclavian stenosis, PUD, and GIB presenting as direct admit from endoscopy suite with ischemic gastritis status post EGD. Endoscopy noted to have many oozing cratered gastric ulcers with oozing hemorrhage in gastric body. Heparin currently infusing at 1050 units/hr.    Goal: Heparin Level: 0.30 - 0.70 IU/mL  Plan:  07/28 @ 0430 HL 0.55 therapeutic. Will continue current rate and will recheck anti-Xa w/ am labs.  Thomasene Ripple, PharmD, BCPS Clinical Pharmacist 06/01/2019,6:33 AM

## 2019-06-01 NOTE — Telephone Encounter (Signed)
Error

## 2019-06-01 NOTE — TOC Initial Note (Signed)
Transition of Care [facility]) - Initial/Assessment Note    Patient Details  Name: Kevin Case MRN: [id number] Date of Birth: 1941/09/11  [facility]) CM/SW Contact:    Candie Chroman, LCSW Phone Number: 06/01/2019, 4:40 PM  Clinical Narrative:  Readmission prevention screen complete. CSW met with patient, introduced role, and explained that PT recommendations would be discussed. Patient does not want any home health or DME. He said he walked around the ICU twice. His wife will pick him up tomorrow. No further concerns. CSW encouraged pateint to contact CSW as needed. CSW will continue to follow patient for support and facilitate return home when stable.               Expected Discharge Plan: Home/Self Care Barriers to Discharge: Continued Medical Work up   Patient Goals and CMS Choice        Expected Discharge Plan and Services Expected Discharge Plan: Home/Self Care     Post Acute Care Choice: NA Living arrangements for the past 2 months: Single Family Home                                      Prior Living Arrangements/Services Living arrangements for the past 2 months: Single Family Home Lives with:: Spouse Patient language and need for interpreter reviewed:: Yes Do you feel safe going back to the place where you live?: Yes      Need for Family Participation in Patient Care: Yes (Comment) Care giver support system in place?: Yes (comment)   Criminal Activity/Legal Involvement Pertinent to Current Situation/Hospitalization: No - Comment as needed  Activities of Daily Living Home Assistive Devices/Equipment: Cane (specify quad or straight) ADL Screening (condition at time of admission) Patient's cognitive ability adequate to safely complete daily activities?: Yes Is the patient deaf or have difficulty hearing?: No Does the patient have difficulty seeing, even when wearing glasses/contacts?: No Does the patient have difficulty concentrating,  remembering, or making decisions?: No Patient able to express need for assistance with ADLs?: Yes Does the patient have difficulty dressing or bathing?: No Independently performs ADLs?: Yes (appropriate for developmental age) Does the patient have difficulty walking or climbing stairs?: Yes Weakness of Legs: Both Weakness of Arms/Hands: None  Permission Sought/Granted   Permission granted to share information with : No              Emotional Assessment Appearance:: Appears stated age Attitude/Demeanor/Rapport: Engaged Affect (typically observed): Appropriate, Pleasant Orientation: : Oriented to Self, Oriented to Place, Oriented to  Time, Oriented to Situation Alcohol / Substance Use: Never Used Psych Involvement: No (comment)  Admission diagnosis:  Ischemic gastroenteritis ([place]) [K55.9] Patient Active Problem List   Diagnosis Date Noted  . PUD (peptic ulcer disease)   . Ischemic gastroenteritis ([place])   . Type 2 diabetes mellitus with stage 3 chronic kidney disease, without long-term current use of insulin ([place]) 05/06/2019  . GI bleed 04/02/2019  . Hypotension 04/02/2019  . Stenosis of left subclavian artery ([place]) 03/31/2019  . Non-ST elevation (NSTEMI) myocardial infarction ([place])   . Acute systolic heart failure ([place])   . Acute respiratory failure with hypoxia ([place]) 03/12/2019   PCP:  Trinna Post, PA-C Pharmacy:   [facility] [address], [place] - [employer] [place] [address] Phone: [phone] Fax: [phone]     Social Determinants of Health (SDOH) Interventions  Readmission Risk Interventions Readmission Risk Prevention Plan 06/01/2019  Transportation Screening Complete  Home Care Screening Patient refused  Medication Review (RN CM) Complete  Some recent data might be hidden

## 2019-06-01 NOTE — Progress Notes (Signed)
Patient transferred from ICU to 2A room 255.  Oriented to room and surroundings. Patient without complaints. Call bell in reach.

## 2019-06-01 NOTE — Progress Notes (Signed)
[employer] - [place] [facility]   PATIENT NAME: Kevin Case    MR#:  [id number]  DATE OF BIRTH:  1941-03-04  SUBJECTIVE:  CHIEF COMPLAINT:  No chief complaint on file.  -Patient with multiple ischemic gastric ulcers, s/p SMA stent.  -  Tolerating liquids well  REVIEW OF SYSTEMS:  Review of Systems  Constitutional: Negative for chills, fever and malaise/fatigue.  HENT: Negative for congestion, ear discharge, hearing loss and nosebleeds.   Eyes: Negative for blurred vision and double vision.  Respiratory: Negative for cough, shortness of breath and wheezing.   Cardiovascular: Negative for chest pain and palpitations.  Gastrointestinal: Negative for abdominal pain, constipation, diarrhea, nausea and vomiting.  Genitourinary: Negative for dysuria.  Musculoskeletal: Negative for myalgias.  Neurological: Negative for dizziness, focal weakness, seizures, weakness and headaches.  Psychiatric/Behavioral: Negative for depression.    DRUG ALLERGIES:  No Known Allergies  VITALS:  Blood pressure (!) 129/57, pulse 60, temperature 98.4 F (36.9 C), temperature source Oral, resp. rate 13, height 5\' 9"  (1.753 m), weight 63.1 kg, SpO2 100 %.  PHYSICAL EXAMINATION:  Physical Exam   GENERAL:  78 y.o.-year-old thin built patient lying in the bed with no acute distress.  EYES: Pupils equal, round, reactive to light and accommodation. No scleral icterus. Extraocular muscles intact.  HEENT: Head atraumatic, normocephalic. Oropharynx and nasopharynx clear.  NECK:  Supple, no jugular venous distention. No thyroid enlargement, no tenderness.  LUNGS: Normal breath sounds bilaterally, no wheezing, rales,rhonchi or crepitation. No use of accessory muscles of respiration.  Decreased bibasilar breath sounds CARDIOVASCULAR: S1, S2 normal. No murmurs, rubs, or gallops.  ABDOMEN: Soft, non tender, nondistended. Bowel sounds present. No organomegaly or mass.  EXTREMITIES: No pedal  edema, cyanosis, or clubbing.  NEUROLOGIC: Cranial nerves II through XII are intact. Muscle strength 5/5 in all extremities. Sensation intact. Gait not checked.  PSYCHIATRIC: The patient is alert and oriented x 3.  SKIN: No obvious rash, lesion, or ulcer.    LABORATORY PANEL:   CBC Recent Labs  Lab 06/01/19 0439  WBC 5.6  HGB 10.0*  HCT 32.3*  PLT 198   ------------------------------------------------------------------------------------------------------------------  Chemistries  Recent Labs  Lab 05/29/19 0531  05/31/19 0526 06/01/19 0439  NA 139   < > 140 138  K 3.9   < > 3.7 3.8  CL 110   < > 111 109  CO2 20*   < > 24 22  GLUCOSE 133*   < > 102* 102*  BUN 20   < > 19 17  CREATININE 1.27*   < > 1.10 1.14  CALCIUM 8.1*   < > 7.9* 8.0*  MG 2.2  --  2.4  --   AST 53*  --   --   --   ALT 43  --   --   --   ALKPHOS 80  --   --   --   BILITOT 1.0  --   --   --    < > = values in this interval not displayed.   ------------------------------------------------------------------------------------------------------------------  Cardiac Enzymes No results for input(s): TROPONINI in the last 168 hours. ------------------------------------------------------------------------------------------------------------------  RADIOLOGY:  No results found.  EKG:   Orders placed or performed during the hospital encounter of 05/27/19  . EKG 12-Lead  . EKG 12-Lead  . EKG 12-Lead  . EKG 12-Lead    ASSESSMENT AND PLAN:   78 year old male with past medical history significant for subclavian stenosis, PAD, aortic stenosis, ischemic cardiomyopathy,  hypertension, chronic combined heart failure, history of gastric ulcers and GI bleed, known history of iron deficiency anemia admitted after EGD showing several necrotic gastric ulcers.  1.  Hypotension-likely hypovolemic shock - Much improved BP -Received IV fluids.  off dopamine and Levophed drips  -Blood pressure is improved. -No  evidence of sepsis.  On empiric  Zosyn-ruled out sepsis- DC ABX, procalcitonin is negative - vancomycin d/ced  2.  Several actively bleeding gastric ulcers-minimal oozing noted on EGD.  -  Hemoglobin is stable.  Did not receive any transfusion this admission -Appreciate GI consult.  Prior history of gastric ulcers as well.  Likely secondary to mild ischemic necrosis from severe celiac and SMA stenosis -Appreciate general surgery consult.  No indication for surgery at this time. -on IV Protonix- change to PO today - Dobhoff tube removed, tolerating clears- per GI- advance to low residue diet as tolerated  3.  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 - on IV heparin drip - dc today - change to plavix - hold asa given gastric ulcers  4.  CAD and combined CHF-stable at this time.  Continue to monitor closely.  5.  DVT prophylaxis- start SQ lovenox  Lives at home with his wife.  Independent at baseline- updated wife over the phone last evening. Encourage ambulation - possible transfer to floor today   All the records are reviewed and case discussed with Care Management/Social Workerr. Management plans discussed with the patient, family and they are in agreement.  CODE STATUS:  Full Code  TOTAL TIME TAKING CARE OF THIS PATIENT: 37 minutes.   POSSIBLE D/C TOMORROW, DEPENDING ON CLINICAL CONDITION.   Gladstone Lighter M.D on 06/01/2019 at 12:11 PM  Between 7am to 6pm - Pager - [phone]  After 6pm go to [email or web address] - password [email or web address] [email or web address]  Office  [phone]  CC: Primary care physician; Trinna Post, PA-C

## 2019-06-01 NOTE — Progress Notes (Signed)
Pt transferred to Rm # [id number] at this time. VSS prior to transfer. Pt's wife notified. Report called to 2A RN.

## 2019-06-01 NOTE — Evaluation (Signed)
Physical Therapy Evaluation Patient Details Name: Kevin Case MRN: [id number] DOB: 1941-01-09 Today's Date: 06/01/2019   History of Present Illness  From MD assessment 06/01/19: Pt is a 78 year old male with past medical history significant for subclavian stenosis, PAD, aortic stenosis, ischemic cardiomyopathy, hypertension, chronic combined heart failure, history of gastric ulcers and GI bleed, known history of iron deficiency anemia admitted after EGD showing several necrotic gastric ulcers.  Pt is s/p SMA stenting.  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    Clinical Impression  Pt presents with mild deficits in strength, transfers, gait, balance, and activity tolerance but overall performed well during the session.  Pt was Ind with bed mobility tasks and SBA with transfers demonstrating good eccentric and concentric control.  Pt ambulated a total of 150' with a combination of a RW and a SPC.  Pt ambulated with short B step length and slow cadence and presented with min drifting with the [facility] but was grossly steady without LOB.  Pt's SpO2 and HR were WNL during the session with no adverse symptoms noted by the pt.  Pt will benefit from HHPT services upon discharge to safely address above deficits for decreased caregiver assistance, decreased risk of further functional decline, and eventual return to PLOF .      Follow Up Recommendations Home health PT;Supervision for mobility/OOB    Equipment Recommendations  None recommended by PT;Other (comment)(Pt declined recommendation for RW)    Recommendations for Other Services       Precautions / Restrictions Precautions Precautions: Fall Restrictions Weight Bearing Restrictions: No      Mobility  Bed Mobility Overal bed mobility: Independent                Transfers Overall transfer level: Needs  assistance Equipment used: Straight cane Transfers: Sit to/from Stand Sit to Stand: Supervision         General transfer comment: Good eccentric and concentric control during transfers  Ambulation/Gait Ambulation/Gait assistance: Min guard Gait Distance (Feet): 150 Feet Assistive device: Straight cane;Rolling walker (2 wheeled) Gait Pattern/deviations: Step-through pattern;Decreased step length - right;Decreased step length - left Gait velocity: decreased   General Gait Details: Slow cadence with amb with short B step length and mild drifting with a SPC but no LOB  Stairs            Wheelchair Mobility    Modified Rankin (Stroke Patients Only)       Balance Overall balance assessment: Mild deficits observed, not formally tested                                           Pertinent Vitals/Pain Pain Assessment: No/denies pain    Home Living Family/patient expects to be discharged to:: Private residence Living Arrangements: Spouse/significant other Available Help at Discharge: Family;Available 24 hours/day;Neighbor Type of Home: Mobile home Home Access: Stairs to enter Entrance Stairs-Rails: Right;Left;Can reach both Entrance Stairs-Number of Steps: 4 Home Layout: One level Home Equipment: Cane - single point      Prior Function Level of Independence: Independent         Comments: Pt Ind with amb limited community distances, no fall history, Ind with ADLs     Hand Dominance        Extremity/Trunk Assessment   Upper Extremity Assessment Upper  Extremity Assessment: Generalized weakness    Lower Extremity Assessment Lower Extremity Assessment: Generalized weakness       Communication   Communication: No difficulties  Cognition Arousal/Alertness: Awake/alert Behavior During Therapy: WFL for tasks assessed/performed Overall Cognitive Status: Within Functional Limits for tasks assessed                                         General Comments      Exercises Total Joint Exercises Ankle Circles/Pumps: Strengthening;Both;10 reps Heel Slides: Both;10 reps;AROM Hip ABduction/ADduction: AROM;Both;10 reps Straight Leg Raises: AROM;Both;10 reps Long Arc Quad: Strengthening;Both;10 reps;15 reps Knee Flexion: Strengthening;Both;10 reps;15 reps Marching in Standing: AROM;Both;10 reps;Standing   Assessment/Plan    PT Assessment Patient needs continued PT services  PT Problem List Decreased strength;Decreased balance;Decreased activity tolerance;Decreased knowledge of use of DME       PT Treatment Interventions DME instruction;Gait training;Stair training;Functional mobility training;Therapeutic activities;Therapeutic exercise;Balance training;Patient/family education    PT Goals (Current goals can be found in the Care Plan section)  Acute Rehab PT Goals Patient Stated Goal: To return home PT Goal Formulation: With patient Time For Goal Achievement: 06/14/19 Potential to Achieve Goals: Good    Frequency Min 2X/week   Barriers to discharge        Co-evaluation               AM-PAC PT "6 Clicks" Mobility  Outcome Measure Help needed turning from your back to your side while in a flat bed without using bedrails?: None Help needed moving from lying on your back to sitting on the side of a flat bed without using bedrails?: None Help needed moving to and from a bed to a chair (including a wheelchair)?: A Little Help needed standing up from a chair using your arms (e.g., wheelchair or bedside chair)?: A Little Help needed to walk in hospital room?: A Little Help needed climbing 3-5 steps with a railing? : A Little 6 Click Score: 20    End of Session Equipment Utilized During Treatment: Gait belt Activity Tolerance: Patient tolerated treatment well Patient left: in bed;with call bell/phone within reach;with bed alarm set Nurse Communication: Mobility status PT Visit Diagnosis:  Difficulty in walking, not elsewhere classified (R26.2);Muscle weakness (generalized) (M62.81);Unsteadiness on feet (R26.81)    Time: 1100-1131 PT Time Calculation (min) (ACUTE ONLY): 31 min   Charges:   PT Evaluation $PT Eval Low Complexity: 1 Low PT Treatments $Therapeutic Exercise: 8-22 mins        D. Elly Modena PT, DPT 06/01/19, 12:28 PM

## 2019-06-01 NOTE — Progress Notes (Signed)
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  -advanced diet patient is tolerating well  -Hemodynamically stable off of vasopressors and inotropes   -discussed with Dr Nemiah Commander for possible transfer to medical floor    PAST MEDICAL HISTORY   Past Medical History:  Diagnosis Date  . Anemia   . Blood transfusion without reported diagnosis   . CAD (coronary artery disease)    a. 03/2019 Cath: LM nl, LAD 50p CA2+, 60m, D1 50ost, LCX 20p, 11m, RCA small, mild diff dzs-->Med rx. Consider PCI LCX for refractory angina.  . Carotid arterial disease (HCC)    a. 03/2019 Carotid U/S: RICA 1-39%, RECA <50%, LICA 1-39%, LECA <50%.  . CHF (congestive heart failure) (HCC)   . Clotting disorder (HCC)   . Diabetes mellitus without complication (HCC)   . GIB (gastrointestinal bleeding)    a. 03/2019 BRBPR following PV procedure req anticoagulation-->2u PRBCs.  . Heart murmur   . HFrEF (heart failure with reduced ejection fraction) (HCC)    a. 03/2019 Echo: EF 30-35%, mild conc LVH. Mildly dil LA. Mod MV annular dil. Mod AS (mean grad , Valve area 0.76).  Marland Kitchen History of tobacco abuse   . Hypertension   . Ischemic cardiomyopathy    a. 03/2019 Echo: EF 30-35%.  . Left bundle branch block   . Moderate aortic stenosis    a. 03/2019 Echo:  Mod AS (mean grad , Valve area 0.76).  Marland Kitchen PAD (peripheral artery disease) (HCC)    a. 03/2019 PV Angio: RCFA 80, RSFA 100.  . Rib fracture   . Sinus bradycardia   . Subclavian arterial stenosis (HCC)    a. 03/2019 PV Angio: RSCA 90 after origin of CCA. RCCA 30ost, LSCA 80 (PTA and covered stenting).      SURGICAL HISTORY   Past Surgical History:  Procedure Laterality Date  . AORTIC ARCH ANGIOGRAPHY N/A 03/31/2019   Procedure: AORTIC ARCH ANGIOGRAPHY;  Surgeon: Iran Ouch, MD;  [facility] IN[facility];  Service: Cardiovascular;  Laterality: N/A;  . CARDIAC CATHETERIZATION    . ESOPHAGOGASTRODUODENOSCOPY (EGD) WITH PROPOFOL N/A 05/27/2019   Procedure: ESOPHAGOGASTRODUODENOSCOPY (EGD) WITH PROPOFOL;  Surgeon: Toney Reil, MD;  Location[facility] ENDOSCOPY;  Service: Gastroenterology;  Laterality: N/A;  . PERIPHERAL VASCULAR INTERVENTION Left 03/31/2019   Procedure: PERIPHERAL VASCULAR INTERVENTION;  Surgeon: Iran Ouch, MD;  Location: [facility] INVASIVE CV LAB;  Service: Cardiovascular;  Laterality: Left;  . RIGHT/LEFT HEART CATH AND CORONARY ANGIOGRAPHY N/A 03/15/2019   Procedure: RIGHT/LEFT HEART CATH AND CORONARY ANGIOGRAPHY;  Surgeon: Iran Ouch, MD;  Location: [facility] INVASIVE CV LAB;  Service: Cardiovascular;  Laterality: N/A;  . VISCERAL ANGIOGRAPHY N/A 05/28/2019   Procedure: VISCERAL ANGIOGRAPHY;  Surgeon: Annice Needy, MD;  Location: [facility] INVASIVE CV LAB;  Service: Cardiovascular;  Laterality: N/A;     FAMILY HISTORY   Family History  Problem Relation Age of Onset  . Congestive Heart Failure Father        had AICD placed.   . Dementia Father   . Dementia Mother      SOCIAL HISTORY   Social History   Tobacco Use  . Smoking status: Former Smoker    Packs/day: 1.50    Years: 57.00    Pack years: 85.50    Types: Cigarettes  .  Smokeless tobacco: Never Used  Substance Use Topics  . Alcohol use: No    Frequency: Never  . Drug use: Never     MEDICATIONS   Current Medication:  Current Facility-Administered Medications:  .  0.9 %  sodium chloride infusion, , Intravenous, PRN, Ottie Glazier, MD, Stopped at 06/01/19 0542 .  atorvastatin (LIPITOR) tablet 80 mg, 80 mg, Oral, QHS, Algernon Huxley, MD, 80 mg at 05/31/19 2129 .  Chlorhexidine Gluconate Cloth  2 % PADS 6 each, 6 each, Topical, Q0600, Algernon Huxley, MD, 6 each at 06/01/19 0246 .  clopidogrel (PLAVIX) tablet 75 mg, 75 mg, Oral, Daily, Gladstone Lighter, MD, 75 mg at 06/01/19 0741 .  docusate (COLACE) 50 MG/5ML liquid 100 mg, 100 mg, Per Tube, Daily, Awilda Bill, NP, 100 mg at 05/30/19 2039 .  feeding supplement (ENSURE ENLIVE) (ENSURE ENLIVE) liquid 237 mL, 237 mL, Oral, TID BM, Lucy Woolever, MD, 237 mL at 05/31/19 2017 .  Melatonin TABS 10 mg, 10 mg, Oral, QHS, Dew, Erskine Squibb, MD, 10 mg at 05/31/19 2302 .  multivitamin with minerals tablet 1 tablet, 1 tablet, Oral, Daily, Pavneet Markwood, MD .  ondansetron (ZOFRAN) injection 4 mg, 4 mg, Intravenous, Q6H PRN, Algernon Huxley, MD, 4 mg at 05/28/19 2237 .  pantoprazole (PROTONIX) injection 40 mg, 40 mg, Intravenous, Q12H, Dew, Erskine Squibb, MD, 40 mg at 05/31/19 2129 .  sennosides (SENOKOT) 8.8 MG/5ML syrup 5 mL, 5 mL, Per Tube, QHS, Awilda Bill, NP, 5 mL at 05/30/19 2101 .  sodium chloride (PF) 0.9 % injection 10 mL, 10 mL, Other, PRN, Lucky Cowboy, Erskine Squibb, MD, 10 mL at 05/27/19 1450    ALLERGIES   Patient has no known allergies.    REVIEW OF SYSTEMS     10 point ROS done and is negative except as per subjective findings.   PHYSICAL EXAMINATION   Vitals:   06/01/19 0600 06/01/19 0800  BP: (!) 137/47 (!) 135/93  Pulse: (!) 47 (!) 51  Resp: 11 17  Temp:  98.4 F (36.9 C)  SpO2: 98% 98%    GENERAL:NAD  HEAD: Normocephalic, atraumatic.  EYES: Pupils equal, round, reactive to light.  No scleral icterus.  MOUTH: Moist mucosal membrane. NECK: Supple. No thyromegaly. No nodules. No JVD.  PULMONARY: CTAB CARDIOVASCULAR: S1 and S2. Regular rate and rhythm. No murmurs, rubs, or gallops.  GASTROINTESTINAL: Soft, nontender, non-distended. No masses. Positive bowel sounds. No hepatosplenomegaly.  MUSCULOSKELETAL: No swelling, clubbing, or edema.  NEUROLOGIC: Mild distress due to acute illness SKIN:intact,warm,dry   LABS AND  IMAGING       LAB RESULTS: Recent Labs  Lab 05/30/19 0102 05/31/19 0526 06/01/19 0439  NA 138 140 138  K 3.8 3.7 3.8  CL 110 111 109  CO2 24 24 22   BUN 23 19 17   CREATININE 1.40* 1.10 1.14  GLUCOSE 146* 102* 102*   Recent Labs  Lab 05/30/19 0102 05/31/19 0526 06/01/19 0439  HGB 10.5* 10.5* 10.0*  HCT 34.5* 33.8* 32.3*  WBC 6.2 6.5 5.6  PLT 220 213 198     IMAGING RESULTS: No results found.    ASSESSMENT AND PLAN    -Multidisciplinary rounds held today  Peptic ulcer disease  -with necrosis of lesser curverature - s/p GI evaluation - PPI/IVF, ABX -h/h monitoring -Remove Dobbhoff tube now is on liquid diet p.o.   Ischemic gastroenteritis  SMA occlusion -vascular surgery on case - appreciate recommendations - s/p SMA stenting - heparin gtt today ICU  monitoring    Acute on chronic renal failure -follow chem 7 -follow UO -continue Foley Catheter-assess need daily   ID -continue IV abx as prescibed -follow up cultures    GI/Nutrition GI PROPHYLAXIS as indicated DIET-->TF's as tolerated Constipation protocol as indicated  ENDO - ICU hypoglycemic\Hyperglycemia protocol -check FSBS per protocol   ELECTROLYTES -follow labs as needed -replace as needed -pharmacy consultation   DVT/GI PRX ordered -SCDs  TRANSFUSIONS AS NEEDED MONITOR FSBS ASSESS the need for LABS as needed   This document was prepared using Dragon voice recognition software and may include unintentional dictation errors.    Vida RiggerFuad Khaleb Broz, M.D.  Division of Pulmonary & Critical Care Medicine  [facility]

## 2019-06-02 ENCOUNTER — Ambulatory Visit: Payer: Medicare Other | Admitting: Nurse Practitioner

## 2019-06-02 ENCOUNTER — Other Ambulatory Visit: Payer: Self-pay | Admitting: Physician Assistant

## 2019-06-02 ENCOUNTER — Encounter: Payer: Self-pay | Admitting: Physician Assistant

## 2019-06-02 MED ORDER — TRAMADOL HCL 50 MG PO TABS: 50.0000 mg | ORAL_TABLET | Freq: Four times a day (QID) | ORAL | 0 refills | Status: DC | PRN

## 2019-06-02 MED ORDER — CLOPIDOGREL BISULFATE 75 MG PO TABS: 75.0000 mg | ORAL_TABLET | Freq: Every day | ORAL | 2 refills | Status: DC

## 2019-06-02 NOTE — TOC Transition Note (Signed)
Transition of Care [facility]) - CM/SW Discharge Note   Patient Details  Name: Narek Kniss MRN: [id number] Date of Birth: 1940/11/16  [facility] of [facility] CM/SW Contact:  Ross Ludwig, LCSW Phone Number: 06/02/2019, 12:23 PM   Clinical Narrative:    Patient discharging back home, he is refusing home health and does not express any needs.     Barriers to Discharge: Continued Medical Work up   Patient Goals and CMS Choice        Discharge Placement                       Discharge Plan and Services     Post Acute Care Choice: NA                               Social Determinants of Health (SDOH) Interventions     Readmission Risk Interventions Readmission Risk Prevention Plan 06/01/2019  Transportation Screening Complete  Home Care Screening Patient refused  Medication Review (RN CM) Complete  Some recent data might be hidden

## 2019-06-02 NOTE — Discharge Summary (Signed)
[facility] [employer] [place] [facility]   PATIENT NAME: Kevin Case    MR#:  [id number]  DATE OF BIRTH:  10/17/1941  DATE OF ADMISSION:  05/27/2019   ADMITTING PHYSICIAN: Jimmye NormanElizabeth Achieng Ouma, NP  DATE OF DISCHARGE: 06/02/2019 12:55 PM  PRIMARY CARE PHYSICIAN: Trey SailorsPollak, Adriana M, PA-C   ADMISSION DIAGNOSIS:   Ischemic gastroenteritis (HCC) [K55.9]  DISCHARGE DIAGNOSIS:   Active Problems:   PUD (peptic ulcer disease)   Ischemic gastroenteritis (HCC)   SECONDARY DIAGNOSIS:   Past Medical History:  Diagnosis Date  . Anemia   . Blood transfusion without reported diagnosis   . CAD (coronary artery disease)    a. 03/2019 Cath: LM nl, LAD 50p CA2+, 3963m, D1 50ost, LCX 20p, 8436m, RCA small, mild diff dzs-->Med rx. Consider PCI LCX for refractory angina.  . Carotid arterial disease (HCC)    a. 03/2019 Carotid U/S: RICA 1-39%, RECA <50%, LICA 1-39%, LECA <50%.  . CHF (congestive heart failure) (HCC)   . Clotting disorder (HCC)   . Diabetes mellitus without complication (HCC)   . GIB (gastrointestinal bleeding)    a. 03/2019 BRBPR following PV procedure req anticoagulation-->2u PRBCs.  . Heart murmur   . HFrEF (heart failure with reduced ejection fraction) (HCC)    a. 03/2019 Echo: EF 30-35%, mild conc LVH. Mildly dil LA. Mod MV annular dil. Mod AS (mean grad 18mmHg, Valve area 0.76).  Marland Kitchen. History of tobacco abuse   . Hypertension   . Ischemic cardiomyopathy    a. 03/2019 Echo: EF 30-35%.  . Left bundle branch block   . Moderate aortic stenosis    a. 03/2019 Echo:  Mod AS (mean grad 18mmHg, Valve area 0.76).  Marland Kitchen. PAD (peripheral artery disease) (HCC)    a. 03/2019 PV Angio: RCFA 80, RSFA 100.  . Rib fracture   . Sinus bradycardia   . Subclavian arterial stenosis (HCC)    a. 03/2019 PV Angio: RSCA 90 after origin of CCA. RCCA 30ost, LSCA 80 (PTA and covered stenting).    HOSPITAL COURSE:   78 year old male with past medical history significant for subclavian  stenosis, PAD, aortic stenosis, ischemic cardiomyopathy, hypertension, chronic combined heart failure, history of gastric ulcers and GI bleed, known history of iron deficiency anemia admitted after EGD showing several necrotic gastric ulcers.  1.  Hypotension-likely hypovolemic shock on admission - Much improved BP with fluids and vasopressors. Now off of them -No evidence of sepsis.  was on empiric  Zosyn-discontinued later.  Procalcitonin is negative  2.  Several actively bleeding gastric ulcers-minimal oozing noted on EGD.  -  Hemoglobin is stable.  Did not receive any transfusion this admission -Appreciate GI consult.  Prior history of gastric ulcers as well.  Likely secondary to mild ischemic necrosis from severe celiac and SMA stenosis -Appreciate general surgery consult.  No indication for surgery at this time. -On PPI.  Initially required Dobbhoff tube feeds, currently able to advance diet to solid diet and patient tolerating well without any trouble  3.  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 -Received IV heparin for 48 hours, changed to Plavix at discharge.  Ideally would also benefit from adding aspirin but will hold off given gastric ulcers  4.  CAD and combined CHF-stable at this time.  .  Patient is independent at baseline.  Up and ambulatory.  Will be discharged home today  DISCHARGE CONDITIONS:   Guarded  CONSULTS  OBTAINED:   Treatment Team:  Annice Needyew, Jason S, MD  DRUG ALLERGIES:   No Known Allergies DISCHARGE MEDICATIONS:   Allergies as of 06/02/2019   No Known Allergies     Medication List    STOP taking these medications   aspirin 81 MG chewable tablet   furosemide 20 MG tablet Commonly known as: LASIX   Jardiance 10 MG Tabs tablet Generic drug: empagliflozin   losartan 25 MG tablet Commonly known as: COZAAR     TAKE these medications    acetaminophen 500 MG tablet Commonly known as: TYLENOL Take 1,000 mg by mouth every 6 (six) hours as needed (headache.).   atorvastatin 80 MG tablet Commonly known as: LIPITOR Take 80 mg by mouth at bedtime.   clopidogrel 75 MG tablet Commonly known as: PLAVIX Take 1 tablet (75 mg total) by mouth daily. Start taking on: June 03, 2019   cyanocobalamin 100 MCG tablet Take 100 mcg by mouth daily.   docusate sodium 100 MG capsule Commonly known as: COLACE Take 100 mg by mouth daily.   ferrous sulfate 325 (65 FE) MG EC tablet Take 1 tablet (325 mg total) by mouth 2 (two) times daily.   Fish Oil 1000 MG Caps Take 1,000 mg by mouth daily.   Melatonin 10 MG Tabs Take 10 mg by mouth at bedtime.   multivitamin with minerals Tabs tablet Take 1 tablet by mouth daily. Multivitamin for Senior 50+   omeprazole 40 MG capsule Commonly known as: PriLOSEC Take 1 capsule (40 mg total) by mouth 2 (two) times a day.   traMADol 50 MG tablet Commonly known as: Ultram Take 1 tablet (50 mg total) by mouth every 6 (six) hours as needed. What changed: reasons to take this        DISCHARGE INSTRUCTIONS:   1.  PCP follow-up in 1 to 2 weeks 2.  GI follow-up in 3 to 4 weeks 3.  Vascular follow-up in 2 weeks  DIET:   Cardiac diet  ACTIVITY:   Activity as tolerated  OXYGEN:   Home Oxygen: No.  Oxygen Delivery: room air  DISCHARGE LOCATION:   home   If you experience worsening of your admission symptoms, develop shortness of breath, life threatening emergency, suicidal or homicidal thoughts you must seek medical attention immediately by calling 911 or calling your MD immediately  if symptoms less severe.  You Must read complete instructions/literature along with all the possible adverse reactions/side effects for all the Medicines you take and that have been prescribed to you. Take any new Medicines after you have completely understood and accpet all the possible adverse  reactions/side effects.   Please note  You were cared for by a hospitalist during your hospital stay. If you have any questions about your discharge medications or the care you received while you were in the hospital after you are discharged, you can call the unit and asked to speak with the hospitalist on call if the hospitalist that took care of you is not available. Once you are discharged, your primary care physician will handle any further medical issues. Please note that NO REFILLS for any discharge medications will be authorized once you are discharged, as it is imperative that you return to your primary care physician (or establish a relationship with a primary care physician if you do not have one) for your aftercare needs so that they can reassess your need for medications and monitor your lab values.    On the day of Discharge:  VITAL SIGNS:   Blood pressure (!) 99/57, pulse (!) 52, temperature 97.7 F (36.5 C), temperature source Oral, resp. rate 18, height 5\' 9"  (1.753 m), weight 62.5 kg, SpO2 99 %.  PHYSICAL EXAMINATION:    GENERAL:  78 y.o.-year-old thin built patient lying in the bed with no acute distress.  EYES: Pupils equal, round, reactive to light and accommodation. No scleral icterus. Extraocular muscles intact.  HEENT: Head atraumatic, normocephalic. Oropharynx and nasopharynx clear.  NECK:  Supple, no jugular venous distention. No thyroid enlargement, no tenderness.  LUNGS: Normal breath sounds bilaterally, no wheezing, rales,rhonchi or crepitation. No use of accessory muscles of respiration.  Decreased bibasilar breath sounds CARDIOVASCULAR: S1, S2 normal. No murmurs, rubs, or gallops.  ABDOMEN: Soft, non tender, nondistended. Bowel sounds present. No organomegaly or mass.  EXTREMITIES: No pedal edema, cyanosis, or clubbing.  NEUROLOGIC: Cranial nerves II through XII are intact. Muscle strength 5/5 in all extremities. Sensation intact. Gait not checked.   PSYCHIATRIC: The patient is alert and oriented x 3.  SKIN: No obvious rash, lesion, or ulcer.   DATA REVIEW:   CBC Recent Labs  Lab 06/01/19 0439  WBC 5.6  HGB 10.0*  HCT 32.3*  PLT 198    Chemistries  Recent Labs  Lab 05/29/19 0531  05/31/19 0526 06/01/19 0439  NA 139   < > 140 138  K 3.9   < > 3.7 3.8  CL 110   < > 111 109  CO2 20*   < > 24 22  GLUCOSE 133*   < > 102* 102*  BUN 20   < > 19 17  CREATININE 1.27*   < > 1.10 1.14  CALCIUM 8.1*   < > 7.9* 8.0*  MG 2.2  --  2.4  --   AST 53*  --   --   --   ALT 43  --   --   --   ALKPHOS 80  --   --   --   BILITOT 1.0  --   --   --    < > = values in this interval not displayed.     Microbiology Results  Results for orders placed or performed during the hospital encounter of 05/27/19  CULTURE, BLOOD (ROUTINE X 2) w Reflex to ID Panel     Status: None   Collection Time: 05/27/19 12:15 PM   Specimen: BLOOD  Result Value Ref Range Status   Specimen Description BLOOD RIGHT ANTECUBITAL  Final   Special Requests   Final    BOTTLES DRAWN AEROBIC AND ANAEROBIC Blood Culture adequate volume   Culture   Final    NO GROWTH 5 DAYS Performed at [facility], [address] [place], [place] [address]    Report Status 06/01/2019 FINAL  Final  CULTURE, BLOOD (ROUTINE X 2) w Reflex to ID Panel     Status: None   Collection Time: 05/27/19 12:15 PM   Specimen: BLOOD  Result Value Ref Range Status   Specimen Description BLOOD BLOOD LEFT ARM  Final   Special Requests   Final    BOTTLES DRAWN AEROBIC AND ANAEROBIC Blood Culture adequate volume   Culture   Final    NO GROWTH 5 DAYS Performed at [facility], [address], [place], [place] [address]    Report Status 06/01/2019 FINAL  Final  Urine culture     Status: Abnormal   Collection Time: 05/27/19  3:37 PM   Specimen: Urine, Random  Result Value Ref Range Status   Specimen Description   Final    URINE, RANDOM Performed at [facility],  [address] [place], [place] [address]    Special Requests   Final    NONE Performed at [facility], [address] [place], [place] [address]    Culture (A)  Final    <10,000 COLONIES/mL INSIGNIFICANT GROWTH Performed at [facility], [address] [place], [place] [address]    Report Status 05/29/2019 FINAL  Final  MRSA PCR Screening     Status: None   Collection Time: 05/27/19  9:03 PM   Specimen: Nasal Mucosa; Nasopharyngeal  Result Value Ref Range Status   MRSA by PCR NEGATIVE NEGATIVE Final    Comment:        The GeneXpert MRSA Assay (FDA approved for NASAL specimens only), is one component of a comprehensive MRSA colonization surveillance program. It is not intended to diagnose MRSA infection nor to guide or monitor treatment for MRSA infections. Performed at [facility], [address] [place], [place] [address]   SARS Coronavirus 2 (CEPHEID - Performed in [facility] hospital lab), Hosp Order     Status: None   Collection Time: 05/28/19  2:16 AM   Specimen: Nasopharyngeal Swab  Result Value Ref Range Status   SARS Coronavirus 2 NEGATIVE NEGATIVE Final    Comment: (NOTE) If result is NEGATIVE SARS-CoV-2 target nucleic acids are NOT DETECTED. The SARS-CoV-2 RNA is generally detectable in upper and lower  respiratory specimens during the acute phase of infection. The lowest  concentration of SARS-CoV-2 viral copies this assay can detect is 250  copies / mL. A negative result does not preclude SARS-CoV-2 infection  and should not be used as the sole basis for treatment or other  patient management decisions.  A negative result may occur with  improper specimen collection / handling, submission of specimen other  than nasopharyngeal swab, presence of viral mutation(s) within the  areas targeted by this assay, and inadequate number of viral copies  (<250 copies / mL). A negative result must be combined with clinical  observations,  patient history, and epidemiological information. If result is POSITIVE SARS-CoV-2 target nucleic acids are DETECTED. The SARS-CoV-2 RNA is generally detectable in upper and lower  respiratory specimens dur ing the acute phase of infection.  Positive  results are indicative of active infection with SARS-CoV-2.  Clinical  correlation with patient history and other diagnostic information is  necessary to determine patient infection status.  Positive results do  not rule out bacterial infection or co-infection with other viruses. If result is PRESUMPTIVE POSTIVE SARS-CoV-2 nucleic acids MAY BE PRESENT.   A presumptive positive result was obtained on the submitted specimen  and confirmed on repeat testing.  While 2019 novel coronavirus  (SARS-CoV-2) nucleic acids may be present in the submitted sample  additional confirmatory testing may be necessary for epidemiological  and / or clinical management purposes  to differentiate between  SARS-CoV-2 and other Sarbecovirus currently known to infect humans.  If clinically indicated additional testing with an alternate test  methodology [phone]) is advised. The SARS-CoV-2 RNA is generally  detectable in upper and lower respiratory sp ecimens during the acute  phase of infection. The expected result is Negative. Fact Sheet for Patients:  BoilerBrush.com.cyhttps://www.fda.gov/media/136312/download Fact Sheet for Healthcare Providers: https://pope.com/https://www.fda.gov/media/136313/download This test is not yet approved or cleared by the [place] FDA and has been authorized for detection and/or diagnosis of  SARS-CoV-2 by FDA under an Emergency Use Authorization (EUA).  This EUA will remain in effect (meaning this test can be used) for the duration of the COVID-19 declaration under Section 564(b)(1) of the Act, 21 U.S.C. section 360bbb-3(b)(1), unless the authorization is terminated or revoked sooner. Performed at [facility], [address] [place],  [place] [address]     RADIOLOGY:  No results found.   Management plans discussed with the patient, family and they are in agreement.  CODE STATUS:     Code Status Orders  (From admission, onward)         Start     Ordered   05/27/19 1342  Full code  Continuous     05/27/19 1341        Code Status History    Date Active Date Inactive Code Status Order ID Comments User Context   05/27/2019 1037 05/27/2019 1341 DNR [id number]  Lang Snow, NP Inpatient   05/16/2019 2046 05/19/2019 1911 Full Code [id number]  Henreitta Leber, MD Inpatient   03/31/2019 1133 04/02/2019 1554 Full Code [id number]  Wellington Hampshire, MD Inpatient   03/12/2019 2038 03/16/2019 1519 Partial Code [id number]  Mayo, Pete Pelt, MD Inpatient   Advance Care Planning Activity    Advance Directive Documentation     Most Recent Value  Type of Advance Directive  Living will  Pre-existing out of facility DNR order (yellow form or pink MOST form)  -  "MOST" Form in Place?  -      TOTAL TIME TAKING CARE OF THIS PATIENT: 38 minutes.    Gladstone Lighter M.D on 06/02/2019 at 3:21 PM  Between 7am to 6pm - Pager - [phone]  After 6pm go to [email or web address] - password [email or web address] [facility]  [facility]  Office  [phone]  CC: Primary care physician; Trinna Post, PA-C   Note: This dictation was prepared with Dragon dictation along with smaller phrase technology. Any transcriptional errors that result from this process are unintentional.

## 2019-06-02 NOTE — Telephone Encounter (Signed)
Pt needing refill on: traMADol (ULTRAM) 50 MG tablet  Please fill at:  [facility] [address] [place] [phone] (Phone) [phone] (Fax)   Thanks, [employer]

## 2019-06-02 NOTE — Progress Notes (Signed)
Patient discharged to home with family. Tele and IV d/c'd prior to discharge. Verbalizes understanding of discharge instructions. 

## 2019-06-02 NOTE — Care Management Important Message (Signed)
Important Message  Patient Details  Name: Kevin Case MRN: [id number] Date of Birth: [birth year]   Medicare Important Message Given:  Yes     Dannette Barbara 06/02/2019, 11:39 AM

## 2019-06-03 ENCOUNTER — Ambulatory Visit: Payer: Medicare Other | Admitting: Gastroenterology

## 2019-06-03 ENCOUNTER — Telehealth: Payer: Self-pay

## 2019-06-03 MED ORDER — TRAMADOL HCL 50 MG PO TABS: 50.0000 mg | ORAL_TABLET | Freq: Four times a day (QID) | ORAL | 0 refills | Status: DC | PRN

## 2019-06-03 NOTE — Telephone Encounter (Signed)
HFU scheduled 06/09/19.

## 2019-06-03 NOTE — Telephone Encounter (Signed)
Transition Care Management Follow-up Telephone Call  Date of discharge and from where: [facility] on 06/02/19  How have you been since you were released from the hospital? Doing better, is no longer using a walker to get around. Has pain in the abdomen, but only when standing up. Declines fever, coughing up blood or n/v/d.  Any questions or concerns? No   Items Reviewed:  Did the pt receive and understand the discharge instructions provided? Yes   Medications obtained and verified? Yes   Any new allergies since your discharge? No   Dietary orders reviewed? Yes  Do you have support at home? Yes   Other (ie: DME, Home Health, etc) N/A  Functional Questionnaire: (I = Independent and D = Dependent)  Bathing/Dressing- D, wife assisting currently.   Meal Prep- D, wife manages cooking.  Eating- I  Maintaining continence- I  Transferring/Ambulation- Uses a cane as needed.  Managing Meds- D, wife manages meds.   Follow up appointments reviewed:    PCP Hospital f/u appt confirmed? Yes  Scheduled to see Fenton Malling on 06/09/19 @ 11:00 AM.  [facility] f/u appt confirmed? Yes    Are transportation arrangements needed? No   If their condition worsens, is the pt aware to call  their PCP or go to the ED? Yes  Was the patient provided with contact information for the PCP's office or ED? Yes  Was the pt encouraged to call back with questions or concerns? Yes

## 2019-06-04 ENCOUNTER — Encounter: Payer: Self-pay | Admitting: Physician Assistant

## 2019-06-04 ENCOUNTER — Telehealth: Payer: Self-pay

## 2019-06-04 ENCOUNTER — Encounter: Payer: Self-pay | Admitting: Gastroenterology

## 2019-06-04 NOTE — Telephone Encounter (Signed)
Patient's wife called to find out if her husband still need follow up labs. Patient was just discharged from the hospital and had a CBC done 06/01/2019. Advised her to keep scheduled appointment with provider, and if labs need to be ordered she will be notified.

## 2019-06-04 NOTE — Telephone Encounter (Signed)
If we need labs we will get that day when he comes to the office on 06/09/19 for his hospital f/u

## 2019-06-07 NOTE — Progress Notes (Signed)
Cardiology Office Note    Date:  06/10/2019   ID:  Kevin Case, DOB Aug 31, 1941, MRN [id number]  PCP:  Trinna Post, PA-C  Cardiologist:  Kathlyn Sacramento, MD  Electrophysiologist:  None   Chief Complaint: Hospital follow-up  History of Present Illness:   Kevin Case is a 78 y.o. 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  Patient was previously followed at [facility].  Around 2018, he was taken off antihypertensive medication secondary to soft blood pressure.  He was admitted to [facility] in early 03/2019 secondary to progressive dyspnea and a non-STEMI.  He was found to have pulmonary edema.  Echo showed LV systolic dysfunction with an EF of 30 to 35% and moderate aortic stenosis.  Following diuresis, right and left cardiac cath was performed and showed 50% proximal LAD stenosis with 80% LCx stenosis.  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  It was felt the subclavian disease likely explained to the previously documented low blood pressures as he had a 40 mmHg gradient between his central aortic pressure and his left arm.  It was felt the patient would require PTA of the left subclavian in the future.  Medical therapy was recommended for the LCx with plan for PCI if the patient developed refractory angina.  Overall, it was felt his cardiomyopathy was out of proportion to his CAD.  He followed up with Dr. Fletcher Anon with subsequent peripheral angiography being undertaken which revealed 90%  stenosis of the right subclavian artery after the origin of the common carotid artery.  There was an 80% stenosis of the left subclavian artery.  He was also found to have 80% stenosis in a heavily calcified right common femoral artery while the right SFA appeared to be occluded.  PTA and covered stenting was performed within the left subclavian artery.  Post procedure, the patient developed bright red blood per rectum with a drop in hemoglobin from 9.4-6.5.  He required 2 units of packed red blood cells.  In this setting, the patient also experienced hypotension requiring the discontinuation of losartan and brief augmentation of blood pressure with norepinephrine.  Protonix therapy was initiated.  It was felt the bleeding was exacerbated by anticoagulation (heparin/Aggrastat) use during his intervention outlined as above.  Following this, the patient noted some swelling in the right groin at his access site with follow-up ultrasound demonstrating no acute findings.  Follow-up left-sided carotid duplex showed a patent left subclavian artery stent with improved blood flow into the left arm with residual stenosis beyond the stent that was recommended to be monitored.  Patient was admitted to [facility] from 7/12 through 7/15 for recurrent upper GI bleed with associated hematemesis complicated by aspiration pneumonia.  EGD was recommended though deferred to outpatient given the patient's diagnosed aspiration pneumonia.  Dr. Fletcher Anon recommended to discontinue Plavix and restart aspirin once bleeding resolved.  Patient presented for planned outpatient EGD on 7/23 with findings showing oozing gastric ulcers with oozing hemorrhage due to ischemic gastritis resulting in necrotic gastric body.  In this setting the patient was directly admitted from the endoscopy  suite.  Patient was significantly hypotensive requiring vasopressor support.  He did not require any blood transfusions during this admission.  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  Patient comes in accompanied by his wife today and is doing reasonably well from a cardiac perspective.  He continues to note significant weakness and fatigue following his recent admissions to [facility] for recurrent GI bleed as outlined above.  He denies any chest pain, worsening shortness of breath, palpitations, dizziness, presyncope, syncope.  No lower extremity swelling, abdominal distention, orthopnea, PND, early satiety.  No falls since he was discharged.  No BRBPR, melena, mopped assist, hematemesis, or hematuria.  He has follow-up with GI and vascular surgery later this month.  His weight is down 20 pounds since he was last seen in 04/2019 in the setting of the above hospital admissions.  He is just now beginning to get some of his appetite back though has not been supplementing with any high-calorie protein shakes.  Patient was seen by PCP on 8/5 with some shortness of breath with chest x-ray demonstrating resolution of previously seen bilateral infiltrates with no active disease.  Labs showed an improving hemoglobin of 11.7, stable serum creatinine 1.07, and potassium 4.8.  Labs: 06/09/2019 - WBC 6.6, Hgb 11.7, PLT 187, serum creatinine 1.07, potassium 4.8 05/11/2019 - WBC 5.6, Hgb 10.0, PLT 198, potassium 3.8, serum creatinine 1.14, albumin 2.5, AST 53, ALT normal, magnesium 2.4, TSH 6.226 04/2019 - A1c 6.1  Past Medical History:  Diagnosis Date   Anemia    Blood transfusion without reported diagnosis    CAD (coronary artery disease)    a. 03/2019 Cath: LM nl, LAD 50p CA2+, 79m, D1 50ost, LCX 20p, 34m, RCA small, mild diff dzs-->Med rx. Consider PCI LCX for refractory angina.   Carotid arterial disease (HCC)    a. 03/2019 Carotid U/S: RICA 1-39%, RECA <50%, LICA 1-39%, LECA <50%.   CHF (congestive heart failure) (HCC)    Clotting disorder (HCC)     Diabetes mellitus without complication (HCC)    GIB (gastrointestinal bleeding)    a. 03/2019 BRBPR following PV procedure req anticoagulation-->2u PRBCs.   Heart murmur    HFrEF (heart failure with reduced ejection fraction) (HCC)    a. 03/2019 Echo: EF 30-35%, mild conc LVH. Mildly dil LA. Mod MV annular dil. Mod AS (mean grad , Valve area 0.76).   History of tobacco abuse    Hypertension    Ischemic cardiomyopathy    a. 03/2019 Echo: EF 30-35%.   Left bundle branch block    Moderate aortic stenosis    a. 03/2019 Echo:  Mod AS (mean grad , Valve area 0.76).   PAD (peripheral artery disease) (HCC)    a. 03/2019 PV Angio: RCFA 80, RSFA 100.   Rib fracture    Sinus bradycardia    Subclavian arterial stenosis (HCC)    a. 03/2019 PV Angio: RSCA 90 after origin of CCA. RCCA 30ost, LSCA 80 (PTA and covered stenting).    Past Surgical History:  Procedure Laterality Date   AORTIC ARCH ANGIOGRAPHY N/A 03/31/2019   Procedure: AORTIC ARCH ANGIOGRAPHY;  Surgeon: Iran Ouch, MD;  Location: [facility] INVASIVE CV LAB;  Service: Cardiovascular;  Laterality: N/A;   CARDIAC CATHETERIZATION     ESOPHAGOGASTRODUODENOSCOPY (EGD) WITH PROPOFOL N/A 05/27/2019   Procedure: ESOPHAGOGASTRODUODENOSCOPY (EGD) WITH PROPOFOL;  Surgeon: Toney Reil, MD;  Location: [facility]  ENDOSCOPY;  Service: Gastroenterology;  Laterality: N/A;   PERIPHERAL VASCULAR INTERVENTION Left 03/31/2019   Procedure: PERIPHERAL VASCULAR INTERVENTION;  Surgeon: Iran OuchArida, Muhammad A, MD;  Location: [facility] INVASIVE CV LAB;  Service: Cardiovascular;  Laterality: Left;   RIGHT/LEFT HEART CATH AND CORONARY ANGIOGRAPHY N/A 03/15/2019   Procedure: RIGHT/LEFT HEART CATH AND CORONARY ANGIOGRAPHY;  Surgeon: Iran OuchArida, Muhammad A, MD;  Location: [facility] INVASIVE CV LAB;  Service: Cardiovascular;  Laterality: N/A;   VISCERAL ANGIOGRAPHY N/A 05/28/2019   Procedure: VISCERAL ANGIOGRAPHY;  Surgeon: Annice Needyew, Jason S, MD;  Location: [facility] INVASIVE CV  LAB;  Service: Cardiovascular;  Laterality: N/A;    Current Medications: Current Meds  Medication Sig   acetaminophen (TYLENOL) 500 MG tablet Take 1,000 mg by mouth every 6 (six) hours as needed (headache.).   atorvastatin (LIPITOR) 80 MG tablet Take 80 mg by mouth at bedtime.    clopidogrel (PLAVIX) 75 MG tablet Take 1 tablet (75 mg total) by mouth daily.   cyanocobalamin 100 MCG tablet Take 100 mcg by mouth daily.   docusate sodium (COLACE) 100 MG capsule Take 100 mg by mouth daily.    empagliflozin (JARDIANCE) 10 MG TABS tablet Take 10 mg by mouth daily.   ferrous sulfate 325 (65 FE) MG EC tablet Take 1 tablet (325 mg total) by mouth 2 (two) times daily.   Melatonin 10 MG TABS Take 10 mg by mouth at bedtime.    Multiple Vitamin (MULTIVITAMIN WITH MINERALS) TABS tablet Take 1 tablet by mouth daily. Multivitamin for Senior 50+   Omega-3 Fatty Acids (FISH OIL) 1000 MG CAPS Take 1,000 mg by mouth daily.   omeprazole (PRILOSEC) 40 MG capsule Take 1 capsule (40 mg total) by mouth 2 (two) times a day.   traMADol (ULTRAM) 50 MG tablet Take 1 tablet (50 mg total) by mouth every 6 (six) hours as needed.     Allergies:   Patient has no known allergies.   Social History   Socioeconomic History   Marital status: Married    Spouse name: Not on file   Number of children: Not on file   Years of education: Not on file   Highest education level: Not on file  Occupational History   Not on file  Social Needs   Financial resource strain: Not on file   Food insecurity    Worry: Not on file    Inability: Not on file   Transportation needs    Medical: Not on file    Non-medical: Not on file  Tobacco Use   Smoking status: Former Smoker    Packs/day: 1.50    Years: 57.00    Pack years: 85.50    Types: Cigarettes   Smokeless tobacco: Never Used  Substance and Sexual Activity   Alcohol use: No    Frequency: Never   Drug use: Never   Sexual activity: Not on file    Lifestyle   Physical activity    Days per week: Not on file    Minutes per session: Not on file   Stress: Not on file  Relationships   Social connections    Talks on phone: Not on file    Gets together: Not on file    Attends religious service: Not on file    Active member of club or organization: Not on file    Attends meetings of clubs or organizations: Not on file    Relationship status: Not on file  Other Topics Concern   Not on file  Social  History Narrative   Not on file     Family History:  The patient's family history includes Congestive Heart Failure in his father; Dementia in his father and mother.  ROS:   Review of Systems  Constitutional: Positive for malaise/fatigue. Negative for chills, diaphoresis, fever and weight loss.  HENT: Negative for congestion.   Eyes: Negative for discharge and redness.  Respiratory: Negative for cough, hemoptysis, sputum production, shortness of breath and wheezing.   Cardiovascular: Negative for chest pain, palpitations, orthopnea, claudication, leg swelling and PND.  Gastrointestinal: Negative for abdominal pain, blood in stool, constipation, diarrhea, heartburn, melena, nausea and vomiting.  Genitourinary: Negative for hematuria.  Musculoskeletal: Negative for falls and myalgias.  Skin: Negative for rash.  Neurological: Positive for weakness. Negative for dizziness, tingling, tremors, sensory change, speech change, focal weakness and loss of consciousness.  Endo/Heme/Allergies: Does not bruise/bleed easily.  Psychiatric/Behavioral: Negative for substance abuse. The patient is not nervous/anxious.   All other systems reviewed and are negative.    EKGs/Labs/Other Studies Reviewed:    Studies reviewed were summarized above. The additional studies were reviewed today: As above  EKG:  EKG is ordered today.  The EKG ordered today demonstrates NSR, 63 bpm, LBBB (known)  Recent Labs: 03/12/2019: B Natriuretic Peptide  864.0 05/27/2019: TSH 6.226 05/29/2019: ALT 43 05/31/2019: Magnesium 2.4 06/09/2019: BUN 24; Creatinine, Ser 1.07; Hemoglobin 11.7; Platelets 187; Potassium 4.8; Sodium 143  Recent Lipid Panel No results found for: CHOL, TRIG, HDL, CHOLHDL, VLDL, LDLCALC, LDLDIRECT  PHYSICAL EXAM:    VS:  BP 124/60 (BP Location: Left Arm, Patient Position: Sitting, Cuff Size: Normal)    Pulse 64    Ht 5\' 9"  (1.753 m)    Wt 133 lb (60.3 kg)    SpO2 93%    BMI 19.64 kg/m   BMI: Body mass index is 19.64 kg/m.  Physical Exam  Constitutional: He is oriented to person, place, and time. He appears well-developed and well-nourished.  Frail-appearing  HENT:  Head: Normocephalic and atraumatic.  Eyes: Right eye exhibits no discharge. Left eye exhibits no discharge.  Neck: Normal range of motion. No JVD present.  Cardiovascular: Normal rate, regular rhythm, S1 normal and S2 normal. Exam reveals no distant heart sounds, no friction rub, no midsystolic click and no opening snap.  Murmur heard.  Harsh midsystolic murmur is present with a grade of 2/6 at the upper right sternal border radiating to the neck. Pulses:      Posterior tibial pulses are 2+ on the right side and 2+ on the left side.  Left femoral vascular access site is without active bleeding, bruising, warmth, erythema, or tenderness to palpation.  No bruit.  Pulmonary/Chest: Effort normal and breath sounds normal. No respiratory distress. He has no decreased breath sounds. He has no wheezes. He has no rales. He exhibits no tenderness.  Abdominal: Soft. He exhibits no distension. There is no abdominal tenderness.  Musculoskeletal:        General: No edema.  Neurological: He is alert and oriented to person, place, and time.  Skin: Skin is warm and dry. No cyanosis. Nails show no clubbing.  Psychiatric: He has a normal mood and affect. His speech is normal and behavior is normal. Judgment and thought content normal.    Wt Readings from Last 3 Encounters:   06/10/19 133 lb (60.3 kg)  06/09/19 135 lb (61.2 kg)  06/01/19 137 lb 12.8 oz (62.5 kg)     ASSESSMENT & PLAN:   1. HFrEF secondary  to mixed NICM and ICM: He appears euvolemic and well compensated.  EF of 30 to 35% in 03/2019 as outlined above.  Has not previously been on beta-blocker in the setting of prior bradycardia.  He was previously on losartan though this was subsequently held in the setting of AKI with recurrent GI bleed.  With resolution of GI bleed and improvement in renal function we will add back losartan 12.5 mg today with a follow-up BMP in 1 week.  Continue to avoid beta-blocker at this time given heart rate of 64 bpm.  Not currently on spironolactone or Entresto given recent AKI.  Continue to advance evidence-based heart failure therapy as tolerated with ultimate plan to reevaluate LV systolic function after medication optimization.  If his EF remains less than 35% at that time would recommend referral to EP for consideration of CRT in the setting of the patient's underlying left bundle branch block.  CHF education was discussed in detail.  2. CAD involving the native coronary arteries without angina: He is doing well without any symptoms concerning for angina.  He has known 80% stenosis in the mid LCx which was heavily calcified.  Plan is for continued medical therapy given he is asymptomatic unless he develops progressive angina despite optimization of medical therapy.  He is no longer on dual antiplatelet therapy secondary to recurrent GI bleed.  Remains on Plavix in the setting of recent peripheral and visceral intervention.  Continue secondary prevention with Plavix and Lipitor.  Aggressive risk factor modification.  3. Moderate aortic stenosis: Noted on echo in 03/2019 with a mean gradient of 18 mmHg and a valve area of 0.76 cm.  Visually, it appears the stenosis seemed moderate and not severe.  Cannot exclude some degree of Heydes syndrome in the setting of his aortic stenosis  though suspect a significant portion of his anemia was in the setting of recurrent GI bleed.  Follow-up echo in 03/2020.  4. Innominate and left subclavian artery stenosis: Status post left subclavian artery stenting with residual innominate disease.  He denies any claudication in either arms.  Remains on Plavix as outlined above.  5. Lower extremity PAD: Patient with known significant right common femoral arterial disease with occluded right SFA.  He denies any claudication.  He remains on Plavix as monotherapy given recurrent GI bleed with gastritis.  6. Acute blood loss anemia/recurrent GI bleed: Status post EGD as above.  7. SMA/celiac artery stenosis: Status post stenting of the SMA by vascular surgery.  Follow-up with vascular surgery as directed.  8. Hypertension: Blood pressure is well controlled today.  Add back losartan as outlined above.  9. Hyperlipidemia: No recent lipid panel.  Check CMP, lipid panel, and direct LDL.  Remains on atorvastatin 80 mg daily currently.  10. Carotid artery disease: Noted to have 1 to 39% bilateral carotid artery stenosis in 03/2019.  Disposition: F/u with Dr. Kirke CorinArida in 1 month.   Medication Adjustments/Labs and Tests Ordered: Current medicines are reviewed at length with the patient today.  Concerns regarding medicines are outlined above. Medication changes, Labs and Tests ordered today are summarized above and listed in the Patient Instructions accessible in Encounters.   Signed, Eula Listenyan Franke Menter, PA-C 06/10/2019 10:29 AM     [facility] [place] [address] [place], [place] [address] [phone]

## 2019-06-07 NOTE — Telephone Encounter (Signed)
LMTCB

## 2019-06-08 NOTE — Progress Notes (Signed)
Patient: Kevin Case Male    DOB: February 10, 1941   77 y.o.   MRN: [id number] Visit Date: 06/09/2019  Today's Provider: Mar Daring, PA-C   Chief Complaint  Patient presents with  . 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 Treatment for this included SMA stenting, increase diet as tolerated. Telephone follow up was done on 06/03/2019. He reports good compliance with treatment. He reports this condition is Improved.Reports that he is feeling weak and feels worse today then when he got out of the hospital.  This is the third day that patient hasn't vomited.   He does report he is still eating a more soft, bland diet at this time. He is drinking a protein shake in the mornings. Was able to tolerate a fried egg and grits. Reports he has been able to eat yogurt and they added peaches yesterday. He reports being excited about a milkshake today. He did try cheerios with milk and that he did vomit up.   He did also have a small case of aspiration pneumonia while in the hospital from aspirating blood from the necrotic gastric ulcers. He does report having some increased SOB today.  Also reports still having some mild discomfort at the insertion site in the right groin from where he had his stenting done. Reports that it became "really swollen" and "looked like a hernia". They had to hold pressure for over 35 minutes to reduce. He reports since then he gets "tinges" of pain/discomfort.  ------------------------------------------------------------------------------------     No Known Allergies   Current Outpatient Medications:  .  acetaminophen (TYLENOL) 500 MG tablet, Take 1,000 mg by mouth every 6 (six) hours as needed (headache.)., Disp: , Rfl:  .  atorvastatin  (LIPITOR) 80 MG tablet, Take 80 mg by mouth at bedtime. , Disp: , Rfl:  .  clopidogrel (PLAVIX) 75 MG tablet, Take 1 tablet (75 mg total) by mouth daily., Disp: 30 tablet, Rfl: 2 .  cyanocobalamin 100 MCG tablet, Take 100 mcg by mouth daily., Disp: , Rfl:  .  docusate sodium (COLACE) 100 MG capsule, Take 100 mg by mouth daily. , Disp: , Rfl:  .  ferrous sulfate 325 (65 FE) MG EC tablet, Take 1 tablet (325 mg total) by mouth 2 (two) times daily., Disp: 60 tablet, Rfl: 3 .  Melatonin 10 MG TABS, Take 10 mg by mouth at bedtime. , Disp: , Rfl:  .  Multiple Vitamin (MULTIVITAMIN WITH MINERALS) TABS tablet, Take 1 tablet by mouth daily. Multivitamin for Senior 50+, Disp: , Rfl:  .  Omega-3 Fatty Acids (FISH OIL) 1000 MG CAPS, Take 1,000 mg by mouth daily., Disp: , Rfl:  .  omeprazole (PRILOSEC) 40 MG capsule, Take 1 capsule (40 mg total) by mouth 2 (two) times a day., Disp: 60 capsule, Rfl: 0 .  traMADol (ULTRAM) 50 MG tablet, Take 1 tablet (50 mg total) by mouth every 6 (six) hours as needed., Disp: 8 tablet, Rfl: 0  Review of Systems  Constitutional: Positive for appetite change and fatigue. Negative for fever.  HENT: Negative.   Respiratory: Positive for shortness of breath. Negative for cough, chest tightness and wheezing.   Cardiovascular: Negative for chest pain, palpitations and leg swelling.  Gastrointestinal: Positive for abdominal pain, nausea and  vomiting (only once since hopsital with the cheerios). Negative for blood in stool, constipation and diarrhea.  Musculoskeletal: Positive for back pain.  Neurological: Positive for weakness. Negative for numbness.    Social History   Tobacco Use  . Smoking status: Former Smoker    Packs/day: 1.50    Years: 57.00    Pack years: 85.50    Types: Cigarettes  . Smokeless tobacco: Never Used  Substance Use Topics  . Alcohol use: No    Frequency: Never      Objective:   BP (!) 102/50 (BP Location: Left Arm, Patient Position: Sitting, Cuff  Size: Normal) Comment: right arm 80/48  Pulse 63   Temp 97.8 F (36.6 C) (Oral)   Resp 16   Wt 135 lb (61.2 kg)   SpO2 98%   BMI 19.94 kg/m  Vitals:   06/09/19 1104  BP: (!) 102/50  Pulse: 63  Resp: 16  Temp: 97.8 F (36.6 C)  TempSrc: Oral  SpO2: 98%  Weight: 135 lb (61.2 kg)     Physical Exam Vitals signs reviewed.  Constitutional:      General: He is not in acute distress.    Appearance: He is well-developed. He is cachectic. He is ill-appearing. He is not diaphoretic.  HENT:     Head: Normocephalic and atraumatic.  Neck:     Musculoskeletal: Normal range of motion and neck supple.     Thyroid: No thyromegaly.     Vascular: No JVD.     Trachea: No tracheal deviation.  Cardiovascular:     Rate and Rhythm: Normal rate and regular rhythm.     Heart sounds: Murmur present. No friction rub. No gallop.   Pulmonary:     Effort: Pulmonary effort is normal. No respiratory distress.     Breath sounds: Normal breath sounds. No wheezing or rales.  Lymphadenopathy:     Cervical: No cervical adenopathy.  Skin:    General: Skin is warm and dry.  Neurological:     Mental Status: He is alert.     No results found for any visits on 06/09/19.     Assessment & Plan    1. Transition of care performed with sharing of clinical summary Hospitalization notes, consultations, images, labs, and procedure notes all reviewed prior to visit today. Patient is improving slowly, but still very guarded disposition. He and his wife are aware of all f/u appts he has in the coming days/weeks.  2. SMA stenosis (HCC) Diet stable to improving very slightly. Advised to continue to push fluids to stay hydrated.  - CBC w/Diff/Platelet - Basic Metabolic Panel (BMET)  3. 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 SMA stented. Has f/u with Dr. Allegra Lai next week.  - CBC w/Diff/Platelet - Basic Metabolic Panel (BMET)  4. SOB (shortness of breath) H/O  aspiration pneumonia bilaterally from bleeding ulcers. Having increased SOB again. Will recheck CXR as below. I will f/u pending results.  - DG Chest 2 View; Future  5. Aspiration pneumonia due to gastric secretions, unspecified laterality, unspecified part of lung (HCC) See above medical treatment plan. - DG Chest 2 View; Future     Margaretann Loveless, PA-C  [facility]

## 2019-06-09 ENCOUNTER — Other Ambulatory Visit: Payer: Self-pay

## 2019-06-09 ENCOUNTER — Ambulatory Visit (INDEPENDENT_AMBULATORY_CARE_PROVIDER_SITE_OTHER): Payer: Medicare Other | Admitting: Physician Assistant

## 2019-06-09 ENCOUNTER — Encounter: Payer: Self-pay | Admitting: Physician Assistant

## 2019-06-09 ENCOUNTER — Ambulatory Visit
Admission: RE | Admit: 2019-06-09 | Discharge: 2019-06-09 | Disposition: A | Discharge status: Home / Self Care | Payer: Medicare Other | Source: Ambulatory Visit | Attending: Physician Assistant | Admitting: Physician Assistant

## 2019-06-09 ENCOUNTER — Ambulatory Visit
Admission: RE | Admit: 2019-06-09 | Discharge: 2019-06-09 | Disposition: A | Discharge status: Home / Self Care | Payer: Medicare Other | Attending: Physician Assistant | Admitting: Physician Assistant

## 2019-06-09 VITALS — BP 102/50 | HR 63 | Temp 97.8°F | Resp 16 | Wt 135.0 lb

## 2019-06-09 DIAGNOSIS — IMO0001 Reserved for inherently not codable concepts without codable children: Secondary | ICD-10-CM

## 2019-06-09 DIAGNOSIS — R0602 Shortness of breath: Secondary | ICD-10-CM | POA: Insufficient documentation

## 2019-06-09 DIAGNOSIS — K551 Chronic vascular disorders of intestine: Secondary | ICD-10-CM

## 2019-06-09 DIAGNOSIS — Z9189 Other specified personal risk factors, not elsewhere classified: Secondary | ICD-10-CM

## 2019-06-09 DIAGNOSIS — I771 Stricture of artery: Secondary | ICD-10-CM

## 2019-06-09 DIAGNOSIS — J690 Pneumonitis due to inhalation of food and vomit: Secondary | ICD-10-CM

## 2019-06-09 DIAGNOSIS — K254 Chronic or unspecified gastric ulcer with hemorrhage: Secondary | ICD-10-CM

## 2019-06-09 NOTE — Patient Instructions (Signed)
Dehydration, Adult  Dehydration is when there is not enough fluid or water in your body. This happens when you lose more fluids than you take in. Dehydration can range from mild to very bad. It should be treated right away to keep it from getting very bad. Symptoms of mild dehydration may include:  Thirst.  Dry lips.  Slightly dry mouth.  Dry, warm skin.  Dizziness. Symptoms of moderate dehydration may include:  Very dry mouth.  Muscle cramps.  Dark pee (urine). Pee may be the color of tea.  Your body making less pee.  Your eyes making fewer tears.  Heartbeat that is uneven or faster than normal (palpitations).  Headache.  Light-headedness, especially when you stand up from sitting.  Fainting (syncope). Symptoms of very bad dehydration may include:  Changes in skin, such as: ? Cold and clammy skin. ? Blotchy (mottled) or pale skin. ? Skin that does not quickly return to normal after being lightly pinched and let go (poor skin turgor).  Changes in body fluids, such as: ? Feeling very thirsty. ? Your eyes making fewer tears. ? Not sweating when body temperature is high, such as in hot weather. ? Your body making very little pee.  Changes in vital signs, such as: ? Weak pulse. ? Pulse that is more than 100 beats a minute when you are sitting still. ? Fast breathing. ? Low blood pressure.  Other changes, such as: ? Sunken eyes. ? Cold hands and feet. ? Confusion. ? Lack of energy (lethargy). ? Trouble waking up from sleep. ? Short-term weight loss. ? Unconsciousness. Follow these instructions at home:   If told by your doctor, drink an ORS: ? Make an ORS by using instructions on the package. ? Start by drinking small amounts, about  cup (120 mL) every 5-10 minutes. ? Slowly drink more until you have had the amount that your doctor said to have.  Drink enough clear fluid to keep your pee clear or pale yellow. If you were told to drink an ORS, finish the  ORS first, then start slowly drinking clear fluids. Drink fluids such as: ? Water. Do not drink only water by itself. Doing that can make the salt (sodium) level in your body get too low (hyponatremia). ? Ice chips. ? Fruit juice that you have added water to (diluted). ? Low-calorie sports drinks.  Avoid: ? Alcohol. ? Drinks that have a lot of sugar. These include high-calorie sports drinks, fruit juice that does not have water added, and soda. ? Caffeine. ? Foods that are greasy or have a lot of fat or sugar.  Take over-the-counter and prescription medicines only as told by your doctor.  Do not take salt tablets. Doing that can make the salt level in your body get too high (hypernatremia).  Eat foods that have minerals (electrolytes). Examples include bananas, oranges, potatoes, tomatoes, and spinach.  Keep all follow-up visits as told by your doctor. This is important. Contact a doctor if:  You have belly (abdominal) pain that: ? Gets worse. ? Stays in one area (localizes).  You have a rash.  You have a stiff neck.  You get angry or annoyed more easily than normal (irritability).  You are more sleepy than normal.  You have a harder time waking up than normal.  You feel: ? Weak. ? Dizzy. ? Very thirsty.  You have peed (urinated) only a small amount of very dark pee during 6-8 hours. Get help right away if:  You have   symptoms of very bad dehydration.  You cannot drink fluids without throwing up (vomiting).  Your symptoms get worse with treatment.  You have a fever.  You have a very bad headache.  You are throwing up or having watery poop (diarrhea) and it: ? Gets worse. ? Does not go away.  You have blood or something green (bile) in your throw-up.  You have blood in your poop (stool). This may cause poop to look black and tarry.  You have not peed in 6-8 hours.  You pass out (faint).  Your heart rate when you are sitting still is more than 100 beats a  minute.  You have trouble breathing. This information is not intended to replace advice given to you by your health care provider. Make sure you discuss any questions you have with your health care provider. Document Released: 08/17/2009 Document Revised: 10/03/2017 Document Reviewed: 12/15/2015 Elsevier Patient Education  2020 [employer] Inc[employer] Peripheral Vascular Disease  Peripheral vascular disease (PVD) is a disease of the blood vessels that are not part of your heart and brain. A simple term for PVD is poor circulation. In most cases, PVD narrows the blood vessels that carry blood from your heart to the rest of your body. This can reduce the supply of blood to your arms, legs, and internal organs, like your stomach or kidneys. However, PVD most often affects a person's lower legs and feet. Without treatment, PVD tends to get worse. PVD can also lead to acute ischemic limb. This is when an arm or leg suddenly cannot get enough blood. This is a medical emergency. Follow these instructions at home: Lifestyle  Do not use any products that contain nicotine or tobacco, such as cigarettes and e-cigarettes. If you need help quitting, ask your doctor.  Lose weight if you are overweight. Or, stay at a healthy weight as told by your doctor.  Eat a diet that is low in fat and cholesterol. If you need help, ask your doctor.  Exercise regularly. Ask your doctor for activities that are right for you. General instructions  Take over-the-counter and prescription medicines only as told by your doctor.  Take good care of your feet: ? Wear comfortable shoes that fit well. ? Check your feet often for any cuts or sores.  Keep all follow-up visits as told by your doctor This is important. Contact a doctor if:  You have cramps in your legs when you walk.  You have leg pain when you are at rest.  You have coldness in a leg or foot.  Your skin changes.  You are unable to get or have an erection  (erectile dysfunction).  You have cuts or sores on your feet that do not heal. Get help right away if:  Your arm or leg turns cold, numb, and blue.  Your arms or legs become red, warm, swollen, painful, or numb.  You have chest pain.  You have trouble breathing.  You suddenly have weakness in your face, arm, or leg.  You become very confused or you cannot speak.  You suddenly have a very bad headache.  You suddenly cannot see. Summary  Peripheral vascular disease (PVD) is a disease of the blood vessels.  A simple term for PVD is poor circulation. Without treatment, PVD tends to get worse.  Treatment may include exercise, low fat and low cholesterol diet, and quitting smoking. This information is not intended to replace advice given to you by your health care provider. Make sure you  discuss any questions you have with your health care provider. Document Released: 01/15/2010 Document Revised: 10/03/2017 Document Reviewed: 11/28/2016 Elsevier Patient Education  2020 [employer].

## 2019-06-10 ENCOUNTER — Encounter: Payer: Self-pay | Admitting: Physician Assistant

## 2019-06-10 ENCOUNTER — Ambulatory Visit (INDEPENDENT_AMBULATORY_CARE_PROVIDER_SITE_OTHER): Payer: Medicare Other | Admitting: Physician Assistant

## 2019-06-10 ENCOUNTER — Telehealth: Payer: Self-pay | Admitting: *Deleted

## 2019-06-10 ENCOUNTER — Telehealth: Payer: Self-pay | Admitting: Physician Assistant

## 2019-06-10 VITALS — BP 124/60 | HR 64 | Ht 69.0 in | Wt 133.0 lb

## 2019-06-10 DIAGNOSIS — I428 Other cardiomyopathies: Secondary | ICD-10-CM | POA: Diagnosis not present

## 2019-06-10 DIAGNOSIS — I5022 Chronic systolic (congestive) heart failure: Secondary | ICD-10-CM | POA: Diagnosis not present

## 2019-06-10 DIAGNOSIS — I10 Essential (primary) hypertension: Secondary | ICD-10-CM

## 2019-06-10 DIAGNOSIS — K922 Gastrointestinal hemorrhage, unspecified: Secondary | ICD-10-CM

## 2019-06-10 DIAGNOSIS — I255 Ischemic cardiomyopathy: Secondary | ICD-10-CM | POA: Diagnosis not present

## 2019-06-10 DIAGNOSIS — I2510 Atherosclerotic heart disease of native coronary artery without angina pectoris: Secondary | ICD-10-CM | POA: Diagnosis not present

## 2019-06-10 DIAGNOSIS — I350 Nonrheumatic aortic (valve) stenosis: Secondary | ICD-10-CM

## 2019-06-10 DIAGNOSIS — E785 Hyperlipidemia, unspecified: Secondary | ICD-10-CM

## 2019-06-10 DIAGNOSIS — K551 Chronic vascular disorders of intestine: Secondary | ICD-10-CM

## 2019-06-10 DIAGNOSIS — D62 Acute posthemorrhagic anemia: Secondary | ICD-10-CM

## 2019-06-10 DIAGNOSIS — I739 Peripheral vascular disease, unspecified: Secondary | ICD-10-CM

## 2019-06-10 DIAGNOSIS — I771 Stricture of artery: Secondary | ICD-10-CM

## 2019-06-10 LAB — BASIC METABOLIC PANEL
BUN/Creatinine Ratio: 22 (ref 10–24)
BUN: 24 mg/dL (ref 8–27)
CO2: 22 mmol/L (ref 20–29)
Calcium: 8.9 mg/dL (ref 8.6–10.2)
Chloride: 105 mmol/L (ref 96–106)
Creatinine, Ser: 1.07 mg/dL (ref 0.76–1.27)
GFR calc Af Amer: 78 mL/min/{1.73_m2} (ref >59)
GFR calc non Af Amer: 68 mL/min/{1.73_m2} (ref >59)
Glucose: 110 mg/dL — ABNORMAL HIGH (ref 65–99)
Potassium: 4.8 mmol/L (ref 3.5–5.2)
Sodium: 143 mmol/L (ref 134–144)

## 2019-06-10 LAB — CBC WITH DIFFERENTIAL/PLATELET
Basophils Absolute: 0 10*3/uL (ref 0.0–0.2)
Basos: 1 %
EOS (ABSOLUTE): 0.1 10*3/uL (ref 0.0–0.4)
Eos: 1 %
Hematocrit: 36.9 % — ABNORMAL LOW (ref 37.5–51.0)
Hemoglobin: 11.7 g/dL — ABNORMAL LOW (ref 13.0–17.7)
Immature Grans (Abs): 0 10*3/uL (ref 0.0–0.1)
Immature Granulocytes: 0 %
Lymphocytes Absolute: 2.8 10*3/uL (ref 0.7–3.1)
Lymphs: 42 %
MCH: 27.7 pg (ref 26.6–33.0)
MCHC: 31.7 g/dL (ref 31.5–35.7)
MCV: 87 fL (ref 79–97)
Monocytes Absolute: 0.7 10*3/uL (ref 0.1–0.9)
Monocytes: 10 %
Neutrophils Absolute: 3.1 10*3/uL (ref 1.4–7.0)
Neutrophils: 46 %
Platelets: 187 10*3/uL (ref 150–450)
RBC: 4.23 x10E6/uL (ref 4.14–5.80)
RDW: 23.2 % — ABNORMAL HIGH (ref 11.6–15.4)
WBC: 6.6 10*3/uL (ref 3.4–10.8)

## 2019-06-10 MED ORDER — LOSARTAN POTASSIUM 25 MG PO TABS: 12.5000 mg | ORAL_TABLET | Freq: Every day | ORAL | 3 refills | Status: DC

## 2019-06-10 NOTE — Patient Instructions (Signed)
Medication Instructions:  Your physician has recommended you make the following change in your medication:  1- DECREASE Losartan Take 0.5 tablets (12.5 mg total) by mouth daily.  If you need a refill on your cardiac medications before your next appointment, please call your pharmacy.   Lab work: Your physician recommends that you have lab work today(LFT, Lipid w direct LDL)   If you have labs (blood work) drawn today and your tests are completely normal, you will receive your results only by: Marland Kitchen MyChart Message (if you have MyChart) OR . A paper copy in the mail If you have any lab test that is abnormal or we need to change your treatment, we will call you to review the results.  Testing/Procedures: None ordered   Follow-Up: At [facility], you and your health needs are our priority.  As part of our continuing mission to provide you with exceptional heart care, we have created designated Provider Care Teams.  These Care Teams include your primary Cardiologist (physician) and Advanced Practice Providers (APPs -  Physician Assistants and Nurse Practitioners) who all work together to provide you with the care you need, when you need it. You will need a follow up appointment in 1 months. You may see Kathlyn Sacramento, MD or  Christell Faith, PA-C.

## 2019-06-10 NOTE — Telephone Encounter (Signed)
-----   Message from Mar Daring, PA-C sent at 06/10/2019  9:14 AM EDT ----- Labs are actually looking good. Hemoglobin has improved from 10.0 to 11.7. Continue iron supplement. Kidney function is doing good. Sodium, potassium and calcium are all normal. Sugar is good.

## 2019-06-10 NOTE — Telephone Encounter (Signed)
-----   Message from Mar Daring, PA-C sent at 06/10/2019  9:15 AM EDT ----- Previous pneumonia now resolved.

## 2019-06-10 NOTE — Telephone Encounter (Signed)
lmov to schedule 1 m fu per checkout 06/10/19 [facility]

## 2019-06-10 NOTE — Telephone Encounter (Signed)
LMOVM for pt to return call 

## 2019-06-11 ENCOUNTER — Telehealth: Payer: Self-pay

## 2019-06-11 LAB — LIPID PANEL
Chol/HDL Ratio: 3.3 ratio (ref 0.0–5.0)
Cholesterol, Total: 108 mg/dL (ref 100–199)
HDL: 33 mg/dL — ABNORMAL LOW (ref >39)
LDL Calculated: 50 mg/dL (ref 0–99)
Triglycerides: 124 mg/dL (ref 0–149)
VLDL Cholesterol Cal: 25 mg/dL (ref 5–40)

## 2019-06-11 LAB — HEPATIC FUNCTION PANEL
ALT: 45 IU/L — ABNORMAL HIGH (ref 0–44)
AST: 58 IU/L — ABNORMAL HIGH (ref 0–40)
Albumin: 3.6 g/dL — ABNORMAL LOW (ref 3.7–4.7)
Alkaline Phosphatase: 98 IU/L (ref 39–117)
Bilirubin Total: 0.2 mg/dL (ref 0.0–1.2)
Bilirubin, Direct: 0.09 mg/dL (ref 0.00–0.40)
Total Protein: 6.8 g/dL (ref 6.0–8.5)

## 2019-06-11 LAB — LDL CHOLESTEROL, DIRECT: LDL Direct: 50 mg/dL (ref 0–99)

## 2019-06-11 NOTE — Telephone Encounter (Signed)
LMTCB

## 2019-06-11 NOTE — Telephone Encounter (Signed)
-----   Message from Rise Mu, PA-C sent at 06/11/2019  7:15 AM EDT ----- Protein level is mildly low, though improved from several days prior. Liver function remains mildly elevated, though is improving. Bad cholesterol is well controlled. Can continue statin for now with recommended recheck liver function in about 2 months.

## 2019-06-11 NOTE — Telephone Encounter (Signed)
Call to patient to review results.   Pt verbalized understanding and had no further questions at this time.   Advised pt to call for any further questions or concerns.  

## 2019-06-11 NOTE — Telephone Encounter (Signed)
LMTCB if patient's wife or caregiver calls please schedule him sometimes in October.

## 2019-06-11 NOTE — Telephone Encounter (Signed)
Can we call and schedule him an appt to come back in October sometime please?

## 2019-06-13 ENCOUNTER — Encounter: Payer: Self-pay | Admitting: Physician Assistant

## 2019-06-14 ENCOUNTER — Telehealth: Payer: Self-pay | Admitting: Cardiovascular Disease

## 2019-06-14 NOTE — Telephone Encounter (Signed)
Labs addessed by provider thru [employer] with [employer].

## 2019-06-14 NOTE — Telephone Encounter (Signed)
Patient wife would like to know if it is ok if patient takes the Shingles vaccine. Please call and advise.

## 2019-06-14 NOTE — Telephone Encounter (Signed)
On review of his chart, I cannot see a reason why he should not be able to receive the Shingrex vaccine unless he is running a fever or fighting an acute illness at this time (he should not take it if immunocompromised).

## 2019-06-14 NOTE — Telephone Encounter (Signed)
Patient had also sent a MyChart message. Providers response sent via MyChart to patient.

## 2019-06-17 ENCOUNTER — Other Ambulatory Visit
Admission: RE | Admit: 2019-06-17 | Discharge: 2019-06-17 | Disposition: A | Discharge status: Home / Self Care | Payer: Medicare Other | Source: Ambulatory Visit | Attending: Physician Assistant | Admitting: Physician Assistant

## 2019-06-17 ENCOUNTER — Other Ambulatory Visit (INDEPENDENT_AMBULATORY_CARE_PROVIDER_SITE_OTHER): Payer: Self-pay | Admitting: Vascular Surgery

## 2019-06-17 ENCOUNTER — Other Ambulatory Visit: Payer: Self-pay

## 2019-06-17 DIAGNOSIS — I739 Peripheral vascular disease, unspecified: Secondary | ICD-10-CM

## 2019-06-17 DIAGNOSIS — I2510 Atherosclerotic heart disease of native coronary artery without angina pectoris: Secondary | ICD-10-CM | POA: Diagnosis present

## 2019-06-17 DIAGNOSIS — K55069 Acute infarction of intestine, part and extent unspecified: Secondary | ICD-10-CM

## 2019-06-17 LAB — BASIC METABOLIC PANEL
Anion gap: 9 (ref 5–15)
BUN: 28 mg/dL — ABNORMAL HIGH (ref 8–23)
CO2: 24 mmol/L (ref 22–32)
Calcium: 8.9 mg/dL (ref 8.9–10.3)
Chloride: 105 mmol/L (ref 98–111)
Creatinine, Ser: 1.12 mg/dL (ref 0.61–1.24)
GFR calc Af Amer: >60 mL/min (ref >60)
GFR calc non Af Amer: >60 mL/min (ref >60)
Glucose, Bld: 139 mg/dL — ABNORMAL HIGH (ref 70–99)
Potassium: 4 mmol/L (ref 3.5–5.1)
Sodium: 138 mmol/L (ref 135–145)

## 2019-06-18 ENCOUNTER — Ambulatory Visit (INDEPENDENT_AMBULATORY_CARE_PROVIDER_SITE_OTHER): Payer: Medicare Other

## 2019-06-18 ENCOUNTER — Ambulatory Visit (INDEPENDENT_AMBULATORY_CARE_PROVIDER_SITE_OTHER): Payer: Medicare Other | Admitting: Vascular Surgery

## 2019-06-18 ENCOUNTER — Encounter (INDEPENDENT_AMBULATORY_CARE_PROVIDER_SITE_OTHER): Payer: Self-pay | Admitting: Vascular Surgery

## 2019-06-18 VITALS — BP 126/53 | HR 51 | Resp 16 | Ht 69.0 in | Wt 135.0 lb

## 2019-06-18 DIAGNOSIS — I10 Essential (primary) hypertension: Secondary | ICD-10-CM

## 2019-06-18 DIAGNOSIS — I771 Stricture of artery: Secondary | ICD-10-CM | POA: Diagnosis not present

## 2019-06-18 DIAGNOSIS — E1122 Type 2 diabetes mellitus with diabetic chronic kidney disease: Secondary | ICD-10-CM | POA: Diagnosis not present

## 2019-06-18 DIAGNOSIS — K55069 Acute infarction of intestine, part and extent unspecified: Secondary | ICD-10-CM

## 2019-06-18 DIAGNOSIS — N183 Chronic kidney disease, stage 3 (moderate): Secondary | ICD-10-CM

## 2019-06-18 DIAGNOSIS — I739 Peripheral vascular disease, unspecified: Secondary | ICD-10-CM | POA: Diagnosis not present

## 2019-06-18 DIAGNOSIS — K551 Chronic vascular disorders of intestine: Secondary | ICD-10-CM

## 2019-06-18 NOTE — Assessment & Plan Note (Signed)
blood glucose control important in reducing the progression of atherosclerotic disease. Also, involved in wound healing. On appropriate medications.  

## 2019-06-18 NOTE — Assessment & Plan Note (Signed)
blood pressure control important in reducing the progression of atherosclerotic disease. On appropriate oral medications.  

## 2019-06-18 NOTE — Assessment & Plan Note (Signed)
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  He looks like a new man today.  He still thin and has a ways to go, but his symptoms are markedly improved after intervention.

## 2019-06-18 NOTE — Patient Instructions (Signed)
Chronic Mesenteric Ischemia  Chronic mesenteric ischemia is poor blood flow (circulation) in the vessels that supply blood to the stomach, intestines, and liver (mesenteric organs). When the blood supply is severely restricted, these organs cannot work properly. This condition is also called mesenteric angina, or intestinal angina. This condition is a long-term (chronic) condition. It happens when an artery or vein that provides blood to the mesenteric organs gradually becomes blocked or narrows over time, restricting the blood supply to these organs. What are the causes? This condition is commonly caused by fatty deposits that build up in an artery (plaque), which can narrow the artery and restrict blood flow. Other causes include:  Weakened areas in blood vessel walls (aneurysms).  Conditions that cause twisting or inflammation of blood vessels, such as fibromuscular dysplasia or arteritis.  A disorder in which blood clots form in the veins (venous thrombosis).  Scarring and thickening (fibrosis) of blood vessels caused by radiation therapy.  A tear in the aorta, the body's main artery (aortic dissection).  Blood vessel problems after illegal drug use, such as use of cocaine.  Tumors in the nervous system (neurofibromatosis).  Certain autoimmune diseases, such as lupus. What increases the risk? The following factors may make you more likely to develop this condition:  Being male.  Being over age 50, especially if you have a history of heart problems.  Smoking.  Having congestive heart failure.  Having an irregular heartbeat (arrhythmia).  Having a history of heart attack or stroke.  Having diabetes.  Having high cholesterol.  Having high blood pressure (hypertension).  Being overweight or obese.  Having kidney disease (renal disease) that requires dialysis. What are the signs or symptoms? Symptoms of this condition include:  Pain or cramps in the abdomen that  develop 15-60 minutes after a meal. This pain may last for 1-3 hours. Some people may develop a fear of eating because of this symptom.  Weight loss.  Diarrhea.  Bloody stool.  Nausea.  Vomiting.  Bloating.  Abdominal pain after stress or with exercise. How is this diagnosed? This condition is diagnosed based on:  Your medical history.  A physical exam.  Tests, such as: ? Ultrasound. ? CT scan. ? Blood tests. ? Urine tests. ? An imaging test that involves injecting a dye into your arteries to show blood flow through blood vessels (angiogram). This can help to show if there are any blockages in the vessels that lead to the intestines. ? Passing a small probe through the mouth and into the stomach to measure the output of carbon dioxide (gastric tonometry). This can help to indicate whether there is decreased blood flow to the stomach and intestines. How is this treated? This condition may be treated with:  Dietary changes such as eating smaller, low-fat, meals more frequently.  Lifestyle changes to treat underlying conditions that contribute to the disease, such as high cholesterol and high blood pressure.  Medicines to reduce blood clotting and increase blood flow.  Surgery to remove the blockage, repair arteries or veins, and restore blood flow. This may involve: ? Angioplasty. This is surgery to widen the affected artery, reduce the blockage, and sometimes insert a small, mesh tube (stent). ? Bypass surgery. This may be done to go around (bypass) the blockage and reconnect healthy arteries or veins. ? Placing a stent in the affected area. This may be done to help keep blocked arteries open. Follow these instructions at home: Eating and drinking   Eat a heart-healthy diet. This   includes fresh fruits and vegetables, whole grains, and lean proteins like chicken, fish, and beans.  Avoid foods that contain a lot of: ? Salt (sodium). ? Sugar. ? Saturated fat (such as  red meat). ? Trans fat (such as in fried foods).  Stay hydrated. Drink enough fluid to keep your urine pale yellow. Lifestyle  Stay active and get regular exercise as told by your health care provider. Aim for 150 minutes of moderate activity or 75 minutes of vigorous activity a week. Ask your health care provider what activities and forms of exercise are safe for you.  Maintain a healthy weight.  Work with your health care provider to manage your cholesterol.  Manage any other health problems you have, such as high blood pressure, diabetes, or heart rhythm problems.  Do not use any products that contain nicotine or tobacco, such as cigarettes, e-cigarettes, and chewing tobacco. If you need help quitting, ask your health care provider. General instructions  Take over-the-counter and prescription medicines only as told by your health care provider.  Keep all follow-up visits as told by your health care provider. This is important.  You may need to take actions to prevent or treat constipation, such as: ? Drink enough fluid to keep your urine pale yellow. ? Take over-the-counter or prescription medicines. ? Eat foods that are high in fiber, such as beans, whole grains, and fresh fruits and vegetables. ? Limit foods that are high in fat and processed sugars, such as fried or sweet foods. Contact a health care provider if:  Your symptoms do not improve or they return after treatment.  You have a fever.  You are constipated. Get help right away if you:  Have severe abdominal pain.  Have severe chest pain.  Have shortness of breath.  Feel weak or dizzy.  Have fast or irregular heartbeats (palpitations).  Have numbness or weakness in your face, arm, or leg.  Are confused.  Have trouble speaking or people have trouble understanding what you are saying.  Have trouble urinating.  Have blood in your stool.  Have severe nausea, vomiting, or persistent diarrhea. These  symptoms may represent a serious problem that is an emergency. Do not wait to see if the symptoms will go away. Get medical help right away. Call your local emergency services (911 in the [place] Do not drive yourself to the hospital. Summary  Mesenteric ischemia is poor circulation in the vessels that supply blood to the the stomach, intestines, and liver (mesenteric organs).  This condition happens when an artery or vein that provides blood to the mesenteric organs gradually becomes blocked or narrow, restricting the blood supply to the organs.  This condition is commonly caused by fatty deposits that build up in an artery (plaque), which can narrow the artery and restrict blood flow.  You are more likely to develop this condition if you are over age 50 and have a history of heart problems, high blood pressure, diabetes, or high cholesterol.  This condition is usually treated with medicines, dietary and lifestyle changes, and surgery to remove the blockage, repair arteries or veins, and restore blood flow. This information is not intended to replace advice given to you by your health care provider. Make sure you discuss any questions you have with your health care provider. Document Released: 06/10/2011 Document Revised: 06/26/2018 Document Reviewed: 06/26/2018 [employer] Patient Education  2020 [employer]  

## 2019-06-18 NOTE — Progress Notes (Signed)
MRN : [id number]  Srihari Shellhammer is a 78 y.o. (Jan 26, 1941) male who presents with chief complaint of  Chief Complaint  Patient presents with   Follow-up    [facility] 2week   .  History of Present Illness: Patient returns today in follow up of his chronic mesenteric ischemia.  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  He was incredibly ill in the hospital and at the time we did the procedure there was a high likelihood of mortality.  He has recovered tremendously well and looks great today.  He is eating 3 meals a day.  He says he really has not gained much weight back yet but his abdominal pain is markedly improved and he feels worlds better.  His duplex today shows mildly elevated velocities in the SMA stent but otherwise with good flow.  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  Current Outpatient Medications  Medication Sig Dispense Refill   acetaminophen (TYLENOL) 500 MG tablet Take 1,000 mg by mouth every 6 (six) hours as needed (headache.).     atorvastatin (LIPITOR) 80 MG tablet Take 80 mg by mouth at bedtime.      clopidogrel (PLAVIX) 75 MG tablet Take 1 tablet (75 mg total) by mouth daily. 30 tablet 2   cyanocobalamin 100 MCG tablet Take 100 mcg by mouth daily.     docusate sodium (COLACE) 100 MG capsule Take 100 mg by mouth daily.      empagliflozin (JARDIANCE) 10 MG TABS tablet Take 10 mg by mouth daily.     ferrous sulfate 325 (65 FE) MG EC tablet Take 1 tablet (325 mg total) by mouth 2 (two) times daily. 60 tablet 3   losartan (COZAAR) 25 MG tablet Take 0.5 tablets (12.5 mg total) by mouth daily. 45 tablet 3   Melatonin 10 MG TABS Take 10 mg by mouth at bedtime.      Multiple Vitamin (MULTIVITAMIN WITH MINERALS) TABS tablet Take 1 tablet by mouth daily. Multivitamin for Senior  50+     Omega-3 Fatty Acids (FISH OIL) 1000 MG CAPS Take 1,000 mg by mouth daily.     omeprazole (PRILOSEC) 40 MG capsule Take 1 capsule (40 mg total) by mouth 2 (two) times a day. 60 capsule 0   traMADol (ULTRAM) 50 MG tablet Take 1 tablet (50 mg total) by mouth every 6 (six) hours as needed. 8 tablet 0   No current facility-administered medications for this visit.     Past Medical History:  Diagnosis Date   Anemia    Blood transfusion without reported diagnosis    CAD (coronary artery disease)    a. 03/2019 Cath: LM nl, LAD 50p CA2+, 2m, D1 50ost, LCX 20p, 35m, RCA small, mild diff dzs-->Med rx. Consider PCI LCX for refractory angina.   Carotid arterial disease (HCC)    a. 03/2019 Carotid U/S: RICA 1-39%, RECA <50%, LICA 1-39%, LECA <50%.   CHF (congestive heart failure) (HCC)    Clotting disorder (HCC)    Diabetes mellitus without complication (HCC)    GIB (gastrointestinal bleeding)    a. 03/2019 BRBPR following PV procedure req anticoagulation-->2u PRBCs.   Heart murmur    HFrEF (heart failure with reduced ejection fraction) (HCC)    a. 03/2019 Echo: EF 30-35%, mild conc LVH. Mildly dil LA. Mod MV  annular dil. Mod AS (mean grad , Valve area 0.76).   History of tobacco abuse    Hypertension    Ischemic cardiomyopathy    a. 03/2019 Echo: EF 30-35%.   Left bundle branch block    Moderate aortic stenosis    a. 03/2019 Echo:  Mod AS (mean grad , Valve area 0.76).   PAD (peripheral artery disease) (HCC)    a. 03/2019 PV Angio: RCFA 80, RSFA 100.   Rib fracture    Sinus bradycardia    Subclavian arterial stenosis (HCC)    a. 03/2019 PV Angio: RSCA 90 after origin of CCA. RCCA 30ost, LSCA 80 (PTA and covered stenting).    Past Surgical History:  Procedure Laterality Date   AORTIC ARCH ANGIOGRAPHY N/A 03/31/2019   Procedure: AORTIC ARCH ANGIOGRAPHY;  Surgeon: Iran Ouch, MD;  [facility];  Service: Cardiovascular;  Laterality:  N/A;   CARDIAC CATHETERIZATION     ESOPHAGOGASTRODUODENOSCOPY (EGD) WITH PROPOFOL N/A 05/27/2019   Procedure: ESOPHAGOGASTRODUODENOSCOPY (EGD) WITH PROPOFOL;  Surgeon: Toney Reil, MD;  Location: [facility] ENDOSCOPY;  Service: Gastroenterology;  Laterality: N/A;   PERIPHERAL VASCULAR INTERVENTION Left 03/31/2019   Procedure: PERIPHERAL VASCULAR INTERVENTION;  Surgeon: Iran Ouch, MD;  Location: [facility];  Service: Cardiovascular;  Laterality: Left;   RIGHT/LEFT HEART CATH AND CORONARY ANGIOGRAPHY N/A 03/15/2019   Procedure: RIGHT/LEFT HEART CATH AND CORONARY ANGIOGRAPHY;  Surgeon: Iran Ouch, MD;  Location: [facility] INVASIVE CV LAB;  Service: Cardiovascular;  Laterality: N/A;   VISCERAL ANGIOGRAPHY N/A 05/28/2019   Procedure: VISCERAL ANGIOGRAPHY;  Surgeon: Annice Needy, MD;  Location: [facility] INVASIVE CV LAB;  Service: Cardiovascular;  Laterality: N/A;    Social History Social History   Tobacco Use   Smoking status: Former Smoker    Packs/day: 1.50    Years: 57.00    Pack years: 85.50    Types: Cigarettes   Smokeless tobacco: Never Used  Substance Use Topics   Alcohol use: No    Frequency: Never   Drug use: Never    Family History Family History  Problem Relation Age of Onset   Congestive Heart Failure Father        had AICD placed.    Dementia Father    Dementia Mother     No Known Allergies   REVIEW OF SYSTEMS (Negative unless checked)  Constitutional: Weight loss  Fever  Chills Cardiac: Chest pain   Chest pressure   Palpitations   Shortness of breath when laying flat   Shortness of breath at rest   Shortness of breath with exertion. Vascular:  Pain in legs with walking   Pain in legs at rest   Pain in legs when laying flat   Claudication   Pain in feet when walking  Pain in feet at rest  Pain in feet when laying flat   History of DVT   Phlebitis   Swelling in legs   Varicose veins   Non-healing  ulcers Pulmonary:   Uses home oxygen   Productive cough   Hemoptysis   Wheeze  COPD   Asthma Neurologic:  Dizziness  Blackouts   Seizures   History of stroke   History of TIA  Aphasia   Temporary blindness   Dysphagia   Weakness or numbness in arms   Weakness or numbness in legs Musculoskeletal:  Arthritis   Joint swelling   Joint pain   Low back pain Hematologic:  Easy bruising  Easy bleeding     Hypercoagulable state   [x] Anemic   Gastrointestinal:  [] Blood in stool   [] Vomiting blood  [x] Gastroesophageal reflux/heartburn   [x] Abdominal pain Genitourinary:  [] Chronic kidney disease   [] Difficult urination  [] Frequent urination  [] Burning with urination   [] Hematuria Skin:  [] Rashes   [] Ulcers   [] Wounds Psychological:  [] History of anxiety   []  History of major depression.  Physical Examination  BP (!) 126/53 (BP Location: Left Arm)    Pulse (!) 51    Resp 16    Ht 5\' 9"  (1.753 m)    Wt 135 lb (61.2 kg)    BMI 19.94 kg/m  Gen:  Thin, NAD.  Head: [place]/AT, No temporalis wasting. Ear/Nose/Throat: Hearing grossly intact, nares w/o erythema or drainage Eyes: Conjunctiva clear. Sclera non-icteric Neck: Supple.  Trachea midline Pulmonary:  Good air movement, no use of accessory muscles.  Cardiac: RRR, no JVD Vascular:  Vessel Right Left  Radial Palpable Palpable                                   Gastrointestinal: soft, non-tender/non-distended. No guarding/reflex.  Musculoskeletal: M/S 5/5 throughout.  No deformity or atrophy. No edema. Neurologic: Sensation grossly intact in extremities.  Symmetrical.  Speech is fluent.  Psychiatric: Judgment intact, Mood & affect appropriate for pt's clinical situation. Dermatologic: No rashes or ulcers noted.  No cellulitis or open wounds.       Labs Recent Results (from the past 2160 hour(s))  Basic metabolic panel     Status: Abnormal   Collection Time: 03/25/19 12:09 PM  Result Value Ref  Range   Sodium 137 135 - 145 mmol/L   Potassium 4.3 3.5 - 5.1 mmol/L   Chloride 105 98 - 111 mmol/L   CO2 24 22 - 32 mmol/L   Glucose, Bld 121 (H) 70 - 99 mg/dL   BUN 32 (H) 8 - 23 mg/dL   Creatinine, Ser 3.231.41 (H) 0.61 - 1.24 mg/dL   Calcium 9.0 8.9 - 55.710.3 mg/dL   GFR calc non Af Amer 48 (L) >60 mL/min   GFR calc Af Amer 56 (L) >60 mL/min   Anion gap 8 5 - 15    Comment: Performed at [facility], [address] [place], [place] [address]  CBC with Differential/Platelet     Status: Abnormal   Collection Time: 03/25/19 12:09 PM  Result Value Ref Range   WBC 7.7 4.0 - 10.5 K/uL   RBC 3.87 (L) 4.22 - 5.81 MIL/uL   Hemoglobin 9.4 (L) 13.0 - 17.0 g/dL   HCT 54.231.6 (L) 70.639.0 - 23.752.0 %   MCV 81.7 80.0 - 100.0 fL   MCH 24.3 (L) 26.0 - 34.0 pg   MCHC 29.7 (L) 30.0 - 36.0 g/dL   RDW 62.815.9 (H) 31.511.5 - 17.615.5 %   Platelets 251 150 - 400 K/uL   nRBC 0.0 0.0 - 0.2 %   Neutrophils Relative % 60 %   Neutro Abs 4.6 1.7 - 7.7 K/uL   Lymphocytes Relative 28 %   Lymphs Abs 2.1 0.7 - 4.0 K/uL   Monocytes Relative 8 %   Monocytes Absolute 0.6 0.1 - 1.0 K/uL   Eosinophils Relative 4 %   Eosinophils Absolute 0.3 0.0 - 0.5 K/uL   Basophils Relative 0 %   Basophils Absolute 0.0 0.0 - 0.1 K/uL   Immature Granulocytes 0 %   Abs Immature Granulocytes 0.01 0.00 - 0.07 K/uL  Comment: Performed at [facility], [address] [place], [place] [address]  Novel Coronavirus, NAA (hospital order; send-out to ref lab)     Status: None   Collection Time: 03/25/19 12:27 PM   Specimen: Nasopharyngeal Swab; Respiratory  Result Value Ref Range   SARS-CoV-2, NAA NOT DETECTED NOT DETECTED    Comment: (NOTE) Testing was performed using the cobas(R) SARS-CoV-2 test. This test was developed and its performance characteristics determined by [employer]. This test has not been FDA cleared or approved. This test has been authorized by FDA under an Emergency Use Authorization (EUA). This test  is only authorized for the duration of time the declaration that circumstances exist justifying the authorization of the emergency use of in vitro diagnostic tests for detection of SARS-CoV-2 virus and/or diagnosis of COVID-19 infection under section 564(b)(1) of the Act, 21 U.S.C. 604VWU-9(W)(1), unless the authorization is terminated or revoked sooner. When diagnostic testing is negative, the possibility of a false negative result should be considered in the context of a patient's recent exposures and the presence of clinical signs and symptoms consistent with COVID-19. An individual without symptoms of COVID-19 and who is not shedding SARS-CoV-2 virus would expect to have  a negative (not detected) result in this assay. Performed At: [facility] [address] [place], [place] [id number] Jolene Schimke MD AO:[id number]    Coronavirus Source NASOPHARYNGEAL     Comment: Performed at [facility], [address] [place], [place] [address]  Glucose, capillary     Status: Abnormal   Collection Time: 03/31/19  6:36 AM  Result Value Ref Range   Glucose-Capillary 153 (H) 70 - 99 mg/dL   Comment 1 Notify RN    Comment 2 Document in Chart   POCT Activated clotting time     Status: None   Collection Time: 03/31/19  8:56 AM  Result Value Ref Range   Activated Clotting Time 175 seconds  POCT Activated clotting time     Status: None   Collection Time: 03/31/19  9:03 AM  Result Value Ref Range   Activated Clotting Time 158 seconds  POCT Activated clotting time     Status: None   Collection Time: 03/31/19  9:09 AM  Result Value Ref Range   Activated Clotting Time 175 seconds  POCT Activated clotting time     Status: None   Collection Time: 03/31/19  9:21 AM  Result Value Ref Range   Activated Clotting Time 395 seconds  CBC     Status: Abnormal   Collection Time: 03/31/19 10:31 AM  Result Value Ref Range   WBC 12.7 (H) 4.0 - 10.5 K/uL   RBC 3.46 (L) 4.22 - 5.81  MIL/uL   Hemoglobin 8.3 (L) 13.0 - 17.0 g/dL   HCT 28.4 (L) 13.2 - 44.0 %   MCV 80.1 80.0 - 100.0 fL   MCH 24.0 (L) 26.0 - 34.0 pg   MCHC 30.0 30.0 - 36.0 g/dL   RDW 10.2 (H) 72.5 - 36.6 %   Platelets 227 150 - 400 K/uL   nRBC 0.0 0.0 - 0.2 %    Comment: Performed at [facility], [address] [place], [place] [address]  Glucose, capillary     Status: Abnormal   Collection Time: 03/31/19 11:23 AM  Result Value Ref Range   Glucose-Capillary 118 (H) 70 - 99 mg/dL  CBC     Status: Abnormal   Collection Time: 03/31/19  3:27 PM  Result Value Ref Range  WBC 11.4 (H) 4.0 - 10.5 K/uL   RBC 2.69 (L) 4.22 - 5.81 MIL/uL   Hemoglobin 6.5 (LL) 13.0 - 17.0 g/dL    Comment: REPEATED TO VERIFY THIS CRITICAL RESULT HAS VERIFIED AND BEEN CALLED TO N FRAHM RN BY KIRSTENE FORSYTH ON 05 27 2020 AT 1610, AND HAS BEEN READ BACK.     HCT 21.6 (L) 39.0 - 52.0 %   MCV 80.3 80.0 - 100.0 fL   MCH 24.2 (L) 26.0 - 34.0 pg   MCHC 30.1 30.0 - 36.0 g/dL   RDW 16.1 (H) 09.6 - 04.5 %   Platelets 187 150 - 400 K/uL   nRBC 0.0 0.0 - 0.2 %    Comment: Performed at [facility], [address], [place], [place] [address]  Type and screen [facility]     Status: None   Collection Time: 03/31/19  3:31 PM  Result Value Ref Range   ABO/RH(D) A POS    Antibody Screen NEG    Sample Expiration 04/03/2019,2359    Unit Number [id number]    Blood Component Type RED CELLS,LR    Unit division 00    Status of Unit ISSUED,FINAL    Transfusion Status OK TO TRANSFUSE    Crossmatch Result Compatible    Unit Number [id number]    Blood Component Type RED CELLS,LR    Unit division 00    Status of Unit ISSUED,FINAL    Transfusion Status OK TO TRANSFUSE    Crossmatch Result      Compatible Performed at [facility], [address], [place], [place] [address]   ABO/Rh     Status: None   Collection Time: 03/31/19  3:31 PM  Result Value Ref Range   ABO/RH(D)      A POS Performed  at [facility], [address], [place], [place] [address]   BPAM RBC     Status: None   Collection Time: 03/31/19  3:31 PM  Result Value Ref Range   ISSUE DATE / TIME 027253664403    Blood Product Unit Number [id number]    PRODUCT CODE [id number]    Unit Type and Rh 6200    Blood Product Expiration Date 841660630160    ISSUE DATE / TIME 109323557322    Blood Product Unit Number [id number]    PRODUCT CODE [id number]    Unit Type and Rh 6200    Blood Product Expiration Date 607371062694   Prepare RBC     Status: None   Collection Time: 03/31/19  3:47 PM  Result Value Ref Range   Order Confirmation      ORDER PROCESSED BY BLOOD BANK Performed at [facility], [address] [place], [place] [address]   CBC     Status: Abnormal   Collection Time: 03/31/19  9:43 PM  Result Value Ref Range   WBC 11.8 (H) 4.0 - 10.5 K/uL   RBC 3.77 (L) 4.22 - 5.81 MIL/uL   Hemoglobin 9.3 (L) 13.0 - 17.0 g/dL    Comment: REPEATED TO VERIFY POST TRANSFUSION SPECIMEN    HCT 29.8 (L) 39.0 - 52.0 %   MCV 79.0 (L) 80.0 - 100.0 fL   MCH 24.7 (L) 26.0 - 34.0 pg   MCHC 31.2 30.0 - 36.0 g/dL   RDW 70.3 (H) 50.0 - 93.8 %   Platelets 180 150 - 400 K/uL   nRBC 0.0 0.0 - 0.2 %    Comment: Performed at  [facility], [address] [place] [place], [place] [address]  Basic metabolic panel     Status: Abnormal   Collection Time: 03/31/19  9:46 PM  Result Value Ref Range   Sodium 139 135 - 145 mmol/L   Potassium 4.8 3.5 - 5.1 mmol/L   Chloride 110 98 - 111 mmol/L   CO2 20 (L) 22 - 32 mmol/L   Glucose, Bld 141 (H) 70 - 99 mg/dL   BUN 31 (H) 8 - 23 mg/dL   Creatinine, Ser 6.04 (H) 0.61 - 1.24 mg/dL   Calcium 8.6 (L) 8.9 - 10.3 mg/dL   GFR calc non Af Amer 50 (L) >60 mL/min   GFR calc Af Amer 58 (L) >60 mL/min   Anion gap 9 5 - 15    Comment: Performed at [facility], [address] [place], [place] [address]  Troponin I - ONCE - STAT     Status: Abnormal   Collection Time: 03/31/19   9:46 PM  Result Value Ref Range   Troponin I 0.75 (HH) <0.03 ng/mL    Comment: CRITICAL RESULT CALLED TO, READ BACK BY AND VERIFIED WITH: C SALAS 04/01/2019 AT 0016 BY H SOEWARDIMAN Performed at [facility], [address], [place], [place] [address]   MRSA PCR Screening     Status: None   Collection Time: 04/01/19 12:59 AM   Specimen: Nasal Mucosa; Nasopharyngeal  Result Value Ref Range   MRSA by PCR NEGATIVE NEGATIVE    Comment:        The GeneXpert MRSA Assay (FDA approved for NASAL specimens only), is one component of a comprehensive MRSA colonization surveillance program. It is not intended to diagnose MRSA infection nor to guide or monitor treatment for MRSA infections. Performed at [facility], [address], [place], [place] [address]   CBC with Differential/Platelet     Status: Abnormal   Collection Time: 04/01/19  2:30 AM  Result Value Ref Range   WBC 11.8 (H) 4.0 - 10.5 K/uL   RBC 3.45 (L) 4.22 - 5.81 MIL/uL   Hemoglobin 8.5 (L) 13.0 - 17.0 g/dL    Comment: Reticulocyte Hemoglobin testing may be clinically indicated, consider ordering this additional test [id number]    HCT 26.5 (L) 39.0 - 52.0 %   MCV 76.8 (L) 80.0 - 100.0 fL   MCH 24.6 (L) 26.0 - 34.0 pg   MCHC 32.1 30.0 - 36.0 g/dL   RDW 78.4 (H) 69.6 - 29.5 %   Platelets 167 150 - 400 K/uL   nRBC 0.0 0.0 - 0.2 %   Neutrophils Relative % 69 %   Neutro Abs 8.2 (H) 1.7 - 7.7 K/uL   Lymphocytes Relative 20 %   Lymphs Abs 2.3 0.7 - 4.0 K/uL   Monocytes Relative 11 %   Monocytes Absolute 1.2 (H) 0.1 - 1.0 K/uL   Eosinophils Relative 0 %   Eosinophils Absolute 0.0 0.0 - 0.5 K/uL   Basophils Relative 0 %   Basophils Absolute 0.0 0.0 - 0.1 K/uL   Immature Granulocytes 0 %   Abs Immature Granulocytes 0.05 0.00 - 0.07 K/uL    Comment: Performed at [facility], [address], [place], [place] [address]  Basic metabolic panel     Status: Abnormal   Collection Time: 04/01/19  2:30 AM    Result Value Ref Range   Sodium 137 135 - 145 mmol/L   Potassium 3.7 3.5 - 5.1 mmol/L  Comment: DELTA CHECK NOTED   Chloride 111 98 - 111 mmol/L   CO2 20 (L) 22 - 32 mmol/L   Glucose, Bld 155 (H) 70 - 99 mg/dL   BUN 30 (H) 8 - 23 mg/dL   Creatinine, Ser 1.61 (H) 0.61 - 1.24 mg/dL   Calcium 8.3 (L) 8.9 - 10.3 mg/dL   GFR calc non Af Amer 50 (L) >60 mL/min   GFR calc Af Amer 58 (L) >60 mL/min   Anion gap 6 5 - 15    Comment: Performed at [facility], [address] [place], [place] [address]  Basic metabolic panel     Status: Abnormal   Collection Time: 04/02/19  6:01 AM  Result Value Ref Range   Sodium 138 135 - 145 mmol/L   Potassium 3.7 3.5 - 5.1 mmol/L   Chloride 107 98 - 111 mmol/L   CO2 22 22 - 32 mmol/L   Glucose, Bld 118 (H) 70 - 99 mg/dL   BUN 23 8 - 23 mg/dL   Creatinine, Ser 5.40 (H) 0.61 - 1.24 mg/dL   Calcium 8.9 8.9 - 98.1 mg/dL   GFR calc non Af Amer 49 (L) >60 mL/min   GFR calc Af Amer 57 (L) >60 mL/min   Anion gap 9 5 - 15    Comment: Performed at [facility], [address], [place], [place] [address]  CBC     Status: Abnormal   Collection Time: 04/02/19  6:01 AM  Result Value Ref Range   WBC 12.0 (H) 4.0 - 10.5 K/uL   RBC 3.50 (L) 4.22 - 5.81 MIL/uL   Hemoglobin 8.8 (L) 13.0 - 17.0 g/dL   HCT 82.9 (L) 56.2 - 13.0 %   MCV 78.6 (L) 80.0 - 100.0 fL   MCH 25.1 (L) 26.0 - 34.0 pg   MCHC 32.0 30.0 - 36.0 g/dL   RDW 86.5 (H) 78.4 - 69.6 %   Platelets 174 150 - 400 K/uL   nRBC 0.0 0.0 - 0.2 %    Comment: Performed at [facility], [address], [place], [place] [address]  Basic metabolic panel     Status: Abnormal   Collection Time: 04/09/19  9:48 AM  Result Value Ref Range   Glucose 185 (H) 65 - 99 mg/dL   BUN 29 (H) 8 - 27 mg/dL   Creatinine, Ser 4.13 (H) 0.76 - 1.27 mg/dL   GFR calc non Af Amer 43 (L) >59 mL/min/1.73   GFR calc Af Amer 50 (L) >59 mL/min/1.73   BUN/Creatinine Ratio 19 10 - 24   Sodium 138 134 - 144 mmol/L    Potassium 4.6 3.5 - 5.2 mmol/L   Chloride 103 96 - 106 mmol/L   CO2 22 20 - 29 mmol/L   Calcium 8.9 8.6 - 10.2 mg/dL  CBC w/Diff     Status: Abnormal   Collection Time: 04/09/19  9:48 AM  Result Value Ref Range   WBC 8.8 3.4 - 10.8 x10E3/uL   RBC 3.42 (L) 4.14 - 5.80 x10E6/uL   Hemoglobin 8.6 (L) 13.0 - 17.7 g/dL   Hematocrit 24.4 (L) 01.0 - 51.0 %   MCV 80 79 - 97 fL   MCH 25.1 (L) 26.6 - 33.0 pg   MCHC 31.6 31.5 - 35.7 g/dL   RDW 27.2 (H) 53.6 - 64.4 %   Platelets 229 150 - 450 x10E3/uL   Neutrophils 65 Not Estab. %   Lymphs 23 Not Estab. %  Monocytes 9 Not Estab. %   Eos 3 Not Estab. %   Basos 0 Not Estab. %   Neutrophils Absolute 5.7 1.4 - 7.0 x10E3/uL   Lymphocytes Absolute 2.0 0.7 - 3.1 x10E3/uL   Monocytes Absolute 0.8 0.1 - 0.9 x10E3/uL   EOS (ABSOLUTE) 0.2 0.0 - 0.4 x10E3/uL   Basophils Absolute 0.0 0.0 - 0.2 x10E3/uL   Immature Granulocytes 0 Not Estab. %   Immature Grans (Abs) 0.0 0.0 - 0.1 x10E3/uL  Basic metabolic panel     Status: Abnormal   Collection Time: 04/16/19  7:58 AM  Result Value Ref Range   Sodium 139 135 - 145 mmol/L   Potassium 4.2 3.5 - 5.1 mmol/L   Chloride 107 98 - 111 mmol/L   CO2 23 22 - 32 mmol/L   Glucose, Bld 202 (H) 70 - 99 mg/dL   BUN 24 (H) 8 - 23 mg/dL   Creatinine, Ser 3.241.27 (H) 0.61 - 1.24 mg/dL   Calcium 8.8 (L) 8.9 - 10.3 mg/dL   GFR calc non Af Amer 55 (L) >60 mL/min   GFR calc Af Amer >60 >60 mL/min   Anion gap 9 5 - 15    Comment: Performed at [facility], [address] [place], [place] [address]  Comprehensive Metabolic Panel (CMET)     Status: Abnormal   Collection Time: 05/04/19  3:28 PM  Result Value Ref Range   Glucose 110 (H) 65 - 99 mg/dL   BUN 26 8 - 27 mg/dL   Creatinine, Ser 7.251.36 (H) 0.76 - 1.27 mg/dL   GFR calc non Af Amer 51 (L) >59 mL/min/1.73   GFR calc Af Amer 58 (L) >59 mL/min/1.73   BUN/Creatinine Ratio 19 10 - 24   Sodium 138 134 - 144 mmol/L   Potassium 4.3 3.5 - 5.2 mmol/L   Chloride  103 96 - 106 mmol/L   CO2 21 20 - 29 mmol/L   Calcium 9.3 8.6 - 10.2 mg/dL   Total Protein 7.2 6.0 - 8.5 g/dL   Albumin 4.0 3.7 - 4.7 g/dL   Globulin, Total 3.2 1.5 - 4.5 g/dL   Albumin/Globulin Ratio 1.3 1.2 - 2.2   Bilirubin Total 0.3 0.0 - 1.2 mg/dL   Alkaline Phosphatase 72 39 - 117 IU/L   AST 41 (H) 0 - 40 IU/L   ALT 32 0 - 44 IU/L  CBC with Differential     Status: Abnormal   Collection Time: 05/04/19  3:28 PM  Result Value Ref Range   WBC 8.0 3.4 - 10.8 x10E3/uL   RBC 3.96 (L) 4.14 - 5.80 x10E6/uL   Hemoglobin 10.0 (L) 13.0 - 17.7 g/dL   Hematocrit 36.632.2 (L) 44.037.5 - 51.0 %   MCV 81 79 - 97 fL   MCH 25.3 (L) 26.6 - 33.0 pg   MCHC 31.1 (L) 31.5 - 35.7 g/dL   RDW 34.718.4 (H) 42.511.6 - 95.615.4 %   Platelets 220 150 - 450 x10E3/uL   Neutrophils 61 Not Estab. %   Lymphs 28 Not Estab. %   Monocytes 8 Not Estab. %   Eos 3 Not Estab. %   Basos 0 Not Estab. %   Neutrophils Absolute 4.8 1.4 - 7.0 x10E3/uL   Lymphocytes Absolute 2.3 0.7 - 3.1 x10E3/uL   Monocytes Absolute 0.7 0.1 - 0.9 x10E3/uL   EOS (ABSOLUTE) 0.3 0.0 - 0.4 x10E3/uL   Basophils Absolute 0.0 0.0 - 0.2 x10E3/uL   Immature Granulocytes 0 Not Estab. %  Immature Grans (Abs) 0.0 0.0 - 0.1 x10E3/uL  HgB A1c     Status: Abnormal   Collection Time: 05/04/19  3:28 PM  Result Value Ref Range   Hgb A1c MFr Bld 6.1 (H) 4.8 - 5.6 %    Comment:          Prediabetes: 5.7 - 6.4          Diabetes: >6.4          Glycemic control for adults with diabetes: <7.0    Est. average glucose Bld gHb Est-mCnc 128 mg/dL  Fe+TIBC+Fer     Status: Abnormal   Collection Time: 05/04/19  3:28 PM  Result Value Ref Range   Total Iron Binding Capacity 422 250 - 450 ug/dL   UIBC 161 096 - 045 ug/dL   Iron 409 (HH) 38 - 811 ug/dL   Iron Saturation 68 (H) 15 - 55 %   Ferritin 22 (L) 30 - 400 ng/mL  Lipase, blood     Status: None   Collection Time: 05/16/19  4:32 PM  Result Value Ref Range   Lipase 29 11 - 51 U/L    Comment: Performed at [facility], [address] [place], [place] [address]  Comprehensive metabolic panel     Status: Abnormal   Collection Time: 05/16/19  4:32 PM  Result Value Ref Range   Sodium 137 135 - 145 mmol/L   Potassium 3.8 3.5 - 5.1 mmol/L   Chloride 104 98 - 111 mmol/L   CO2 25 22 - 32 mmol/L   Glucose, Bld 171 (H) 70 - 99 mg/dL   BUN 26 (H) 8 - 23 mg/dL   Creatinine, Ser 2.95 0.61 - 1.24 mg/dL   Calcium 9.3 8.9 - 62.1 mg/dL   Total Protein 7.9 6.5 - 8.1 g/dL   Albumin 3.7 3.5 - 5.0 g/dL   AST 39 15 - 41 U/L   ALT 31 0 - 44 U/L   Alkaline Phosphatase 65 38 - 126 U/L   Total Bilirubin 0.8 0.3 - 1.2 mg/dL   GFR calc non Af Amer 57 (L) >60 mL/min   GFR calc Af Amer >60 >60 mL/min   Anion gap 8 5 - 15    Comment: Performed at [facility], [address] [place], [place] [address]  CBC with Differential     Status: Abnormal   Collection Time: 05/16/19  4:32 PM  Result Value Ref Range   WBC 16.4 (H) 4.0 - 10.5 K/uL   RBC 4.36 4.22 - 5.81 MIL/uL   Hemoglobin 11.0 (L) 13.0 - 17.0 g/dL   HCT 78.4 (L) 69.6 - 29.5 %   MCV 82.3 80.0 - 100.0 fL   MCH 25.2 (L) 26.0 - 34.0 pg   MCHC 30.6 30.0 - 36.0 g/dL   RDW 28.4 (H) 13.2 - 44.0 %   Platelets 232 150 - 400 K/uL   nRBC 0.0 0.0 - 0.2 %   Neutrophils Relative % 85 %   Neutro Abs 14.0 (H) 1.7 - 7.7 K/uL   Lymphocytes Relative 6 %   Lymphs Abs 1.0 0.7 - 4.0 K/uL   Monocytes Relative 8 %   Monocytes Absolute 1.3 (H) 0.1 - 1.0 K/uL   Eosinophils Relative 0 %   Eosinophils Absolute 0.0 0.0 - 0.5 K/uL   Basophils Relative 0 %   Basophils Absolute 0.0 0.0 - 0.1 K/uL   Immature Granulocytes 1 %   Abs Immature Granulocytes 0.10 (H) 0.00 - 0.07  K/uL   Acanthocytes PRESENT    Burr Cells PRESENT    Dimorphism PRESENT    Target Cells PRESENT    Spherocytes PRESENT    Ovalocytes PRESENT     Comment: Performed at [facility], [address] [place], [place] [address]  Occult bld gastric/duodenum (cup to lab)     Status:  Abnormal   Collection Time: 05/16/19  4:59 PM  Result Value Ref Range   pH, Gastric 2    Occult Blood, Gastric POSITIVE (A) NEGATIVE    Comment: Performed at [facility], [address], [place], [place] [address]  SARS Coronavirus 2 (CEPHEID - Performed in [facility] hospital lab), Hosp Order     Status: None   Collection Time: 05/16/19  6:40 PM   Specimen: Nasopharyngeal Swab  Result Value Ref Range   SARS Coronavirus 2 NEGATIVE NEGATIVE    Comment: (NOTE) If result is NEGATIVE SARS-CoV-2 target nucleic acids are NOT DETECTED. The SARS-CoV-2 RNA is generally detectable in upper and lower  respiratory specimens during the acute phase of infection. The lowest  concentration of SARS-CoV-2 viral copies this assay can detect is 250  copies / mL. A negative result does not preclude SARS-CoV-2 infection  and should not be used as the sole basis for treatment or other  patient management decisions.  A negative result may occur with  improper specimen collection / handling, submission of specimen other  than nasopharyngeal swab, presence of viral mutation(s) within the  areas targeted by this assay, and inadequate number of viral copies  (<250 copies / mL). A negative result must be combined with clinical  observations, patient history, and epidemiological information. If result is POSITIVE SARS-CoV-2 target nucleic acids are DETECTED. The SARS-CoV-2 RNA is generally detectable in upper and lower  respiratory specimens dur ing the acute phase of infection.  Positive  results are indicative of active infection with SARS-CoV-2.  Clinical  correlation with patient history and other diagnostic information is  necessary to determine patient infection status.  Positive results do  not rule out bacterial infection or co-infection with other viruses. If result is PRESUMPTIVE POSTIVE SARS-CoV-2 nucleic acids MAY BE PRESENT.   A presumptive positive result was obtained on the submitted  specimen  and confirmed on repeat testing.  While 2019 novel coronavirus  (SARS-CoV-2) nucleic acids may be present in the submitted sample  additional confirmatory testing may be necessary for epidemiological  and / or clinical management purposes  to differentiate between  SARS-CoV-2 and other Sarbecovirus currently known to infect humans.  If clinically indicated additional testing with an alternate test  methodology [phone]) is advised. The SARS-CoV-2 RNA is generally  detectable in upper and lower respiratory sp ecimens during the acute  phase of infection. The expected result is Negative. Fact Sheet for Patients:  BoilerBrush.com.cy Fact Sheet for Healthcare Providers: https://pope.com/ This test is not yet approved or cleared by the [place] FDA and has been authorized for detection and/or diagnosis of SARS-CoV-2 by FDA under an Emergency Use Authorization (EUA).  This EUA will remain in effect (meaning this test can be used) for the duration of the COVID-19 declaration under Section 564(b)(1) of the Act, 21 U.S.C. section 360bbb-3(b)(1), unless the authorization is terminated or revoked sooner. Performed at [facility], [address] [place], [place] [address]   APTT     Status: None   Collection Time: 05/16/19  7:16 PM  Result Value Ref Range   aPTT 29  24 - 36 seconds    Comment: Performed at [facility], [address] [place], [place] [address]  Protime-INR     Status: None   Collection Time: 05/16/19  7:16 PM  Result Value Ref Range   Prothrombin Time 15.0 11.4 - 15.2 seconds   INR 1.2 0.8 - 1.2    Comment: (NOTE) INR goal varies based on device and disease states. Performed at [facility], [address] [place], [place] [address]   Type and screen Ordered by PROVIDER DEFAULT     Status: None   Collection Time: 05/16/19  7:16 PM  Result Value Ref Range   ABO/RH(D) A POS      Antibody Screen NEG    Sample Expiration      05/19/2019,2359 Performed at [facility], [address] [place], [place] [address]   Vitamin B12     Status: Abnormal   Collection Time: 05/16/19  7:20 PM  Result Value Ref Range   Vitamin B-12 1,044 (H) 180 - 914 pg/mL    Comment: (NOTE) This assay is not validated for testing neonatal or myeloproliferative syndrome specimens for Vitamin B12 levels. Performed at [facility], [address] [place], [place] [address]   Glucose, capillary     Status: Abnormal   Collection Time: 05/16/19  9:07 PM  Result Value Ref Range   Glucose-Capillary 139 (H) 70 - 99 mg/dL   Comment 1 Notify RN   Basic metabolic panel     Status: Abnormal   Collection Time: 05/17/19  4:10 AM  Result Value Ref Range   Sodium 142 135 - 145 mmol/L   Potassium 4.0 3.5 - 5.1 mmol/L   Chloride 111 98 - 111 mmol/L   CO2 23 22 - 32 mmol/L   Glucose, Bld 176 (H) 70 - 99 mg/dL   BUN 28 (H) 8 - 23 mg/dL   Creatinine, Ser 5.78 0.61 - 1.24 mg/dL   Calcium 9.0 8.9 - 46.9 mg/dL   GFR calc non Af Amer >60 >60 mL/min   GFR calc Af Amer >60 >60 mL/min   Anion gap 8 5 - 15    Comment: Performed at [facility], [address] [place], [place] [address]  CBC     Status: Abnormal   Collection Time: 05/17/19  4:10 AM  Result Value Ref Range   WBC 19.0 (H) 4.0 - 10.5 K/uL   RBC 4.09 (L) 4.22 - 5.81 MIL/uL   Hemoglobin 10.3 (L) 13.0 - 17.0 g/dL   HCT 84.1 (L) 32.4 - 40.1 %   MCV 83.4 80.0 - 100.0 fL   MCH 25.2 (L) 26.0 - 34.0 pg   MCHC 30.2 30.0 - 36.0 g/dL   RDW 02.7 (H) 25.3 - 66.4 %   Platelets 195 150 - 400 K/uL   nRBC 0.0 0.0 - 0.2 %    Comment: Performed at [facility], [address] [place], [place] [address]  Folate     Status: None   Collection Time: 05/17/19  4:10 AM  Result Value Ref Range   Folate 15.3 >5.9 ng/mL    Comment: Performed at [facility], [address] [place], [place] [address]  Ferritin      Status: Abnormal   Collection Time: 05/17/19  4:10 AM  Result Value Ref Range   Ferritin 23 (L) 24 - 336 ng/mL    Comment: Performed at [facility], [address] [place], [place] [address]  Glucose, capillary     Status: Abnormal   Collection Time: 05/17/19  7:57 AM  Result Value Ref Range   Glucose-Capillary 156 (H) 70 - 99 mg/dL   Comment 1 Notify RN   Glucose, capillary     Status: Abnormal   Collection Time: 05/17/19 11:41 AM  Result Value Ref Range   Glucose-Capillary 159 (H) 70 - 99 mg/dL   Comment 1 Notify RN   Glucose, capillary     Status: Abnormal   Collection Time: 05/17/19  4:45 PM  Result Value Ref Range   Glucose-Capillary 116 (H) 70 - 99 mg/dL   Comment 1 Notify RN   Glucose, capillary     Status: Abnormal   Collection Time: 05/17/19  9:47 PM  Result Value Ref Range   Glucose-Capillary 164 (H) 70 - 99 mg/dL  CBC with Differential/Platelet     Status: Abnormal   Collection Time: 05/18/19  3:20 AM  Result Value Ref Range   WBC 13.6 (H) 4.0 - 10.5 K/uL   RBC 3.89 (L) 4.22 - 5.81 MIL/uL   Hemoglobin 9.9 (L) 13.0 - 17.0 g/dL   HCT 16.1 (L) 09.6 - 04.5 %   MCV 84.8 80.0 - 100.0 fL   MCH 25.4 (L) 26.0 - 34.0 pg   MCHC 30.0 30.0 - 36.0 g/dL   RDW 40.9 (H) 81.1 - 91.4 %   Platelets 172 150 - 400 K/uL   nRBC 0.0 0.0 - 0.2 %   Neutrophils Relative % 84 %   Neutro Abs 11.3 (H) 1.7 - 7.7 K/uL   Lymphocytes Relative 8 %   Lymphs Abs 1.1 0.7 - 4.0 K/uL   Monocytes Relative 8 %   Monocytes Absolute 1.1 (H) 0.1 - 1.0 K/uL   Eosinophils Relative 0 %   Eosinophils Absolute 0.0 0.0 - 0.5 K/uL   Basophils Relative 0 %   Basophils Absolute 0.0 0.0 - 0.1 K/uL   Immature Granulocytes 0 %   Abs Immature Granulocytes 0.04 0.00 - 0.07 K/uL   Schistocytes PRESENT    Polychromasia PRESENT    Ovalocytes PRESENT     Comment: Performed at [facility], [address] [place], [place] [address]  Urinalysis, Routine w reflex microscopic     Status:  Abnormal   Collection Time: 05/18/19  4:31 AM  Result Value Ref Range   Color, Urine YELLOW (A) YELLOW   APPearance CLEAR (A) CLEAR   Specific Gravity, Urine 1.024 1.005 - 1.030   pH 5.0 5.0 - 8.0   Glucose, UA >=500 (A) NEGATIVE mg/dL   Hgb urine dipstick MODERATE (A) NEGATIVE   Bilirubin Urine NEGATIVE NEGATIVE   Ketones, ur NEGATIVE NEGATIVE mg/dL   Protein, ur 621 (A) NEGATIVE mg/dL   Nitrite NEGATIVE NEGATIVE   Leukocytes,Ua NEGATIVE NEGATIVE   WBC, UA 0-5 0 - 5 WBC/hpf   Bacteria, UA NONE SEEN NONE SEEN   Squamous Epithelial / LPF 0-5 0 - 5   Mucus PRESENT     Comment: Performed at [facility], [address] [place], [place] [address]  Glucose, capillary     Status: Abnormal   Collection Time: 05/18/19  7:34 AM  Result Value Ref Range   Glucose-Capillary 120 (H) 70 - 99 mg/dL  Glucose, capillary     Status: Abnormal   Collection Time: 05/18/19 11:29 AM  Result Value Ref Range   Glucose-Capillary 113 (H) 70 - 99 mg/dL  Glucose, capillary     Status: Abnormal   Collection  Time: 05/18/19  4:31 PM  Result Value Ref Range   Glucose-Capillary 156 (H) 70 - 99 mg/dL  Glucose, capillary     Status: Abnormal   Collection Time: 05/18/19  9:29 PM  Result Value Ref Range   Glucose-Capillary 149 (H) 70 - 99 mg/dL  CBC with Differential/Platelet     Status: Abnormal   Collection Time: 05/19/19  6:23 AM  Result Value Ref Range   WBC 12.2 (H) 4.0 - 10.5 K/uL   RBC 3.57 (L) 4.22 - 5.81 MIL/uL   Hemoglobin 9.2 (L) 13.0 - 17.0 g/dL   HCT 30.4 (L) 39.0 - 52.0 %   MCV 85.2 80.0 - 100.0 fL   MCH 25.8 (L) 26.0 - 34.0 pg   MCHC 30.3 30.0 - 36.0 g/dL   RDW 22.6 (H) 11.5 - 15.5 %   Platelets 157 150 - 400 K/uL   nRBC 0.0 0.0 - 0.2 %   Neutrophils Relative % 82 %   Neutro Abs 9.9 (H) 1.7 - 7.7 K/uL   Lymphocytes Relative 10 %   Lymphs Abs 1.2 0.7 - 4.0 K/uL   Monocytes Relative 8 %   Monocytes Absolute 0.9 0.1 - 1.0 K/uL   Eosinophils Relative 0 %   Eosinophils Absolute  0.1 0.0 - 0.5 K/uL   Basophils Relative 0 %   Basophils Absolute 0.0 0.0 - 0.1 K/uL   RBC Morphology MICROCYTES    Immature Granulocytes 0 %   Abs Immature Granulocytes 0.05 0.00 - 0.07 K/uL   Schistocytes PRESENT    Burr Cells PRESENT    Ovalocytes PRESENT     Comment: Performed at [facility], [address] [place], [place] [address]  Glucose, capillary     Status: Abnormal   Collection Time: 05/19/19  8:27 AM  Result Value Ref Range   Glucose-Capillary 109 (H) 70 - 99 mg/dL   Comment 1 Notify RN   Glucose, capillary     Status: None   Collection Time: 05/19/19 12:19 PM  Result Value Ref Range   Glucose-Capillary 91 70 - 99 mg/dL   Comment 1 Notify RN   SARS Coronavirus 2 (Performed in [place] hospital lab)     Status: None   Collection Time: 05/24/19 11:39 AM   Specimen: Nasal Swab  Result Value Ref Range   SARS Coronavirus 2 NEGATIVE NEGATIVE    Comment: (NOTE) SARS-CoV-2 target nucleic acids are NOT DETECTED. The SARS-CoV-2 RNA is generally detectable in upper and lower respiratory specimens during the acute phase of infection. Negative results do not preclude SARS-CoV-2 infection, do not rule out co-infections with other pathogens, and should not be used as the sole basis for treatment or other patient management decisions. Negative results must be combined with clinical observations, patient history, and epidemiological information. The expected result is Negative. Fact Sheet for Patients: SugarRoll.be Fact Sheet for Healthcare Providers: https://www.woods-mathews.com/ This test is not yet approved or cleared by the [place] FDA and  has been authorized for detection and/or diagnosis of SARS-CoV-2 by FDA under an Emergency Use Authorization (EUA). This EUA will remain  in effect (meaning this test can be used) for the duration of the COVID-19 declaration under Section 56 4(b)(1) of the Act, 21  U.S.C. section 360bbb-3(b)(1), unless the authorization is terminated or revoked sooner. Performed at [facility], [place] [address], [place], [place] [address]   Glucose, capillary     Status: None   Collection Time: 05/27/19  8:32 AM  Result  Value Ref Range   Glucose-Capillary 98 70 - 99 mg/dL  CBC     Status: Abnormal   Collection Time: 05/27/19 10:07 AM  Result Value Ref Range   WBC 13.2 (H) 4.0 - 10.5 K/uL   RBC 4.39 4.22 - 5.81 MIL/uL   Hemoglobin 11.7 (L) 13.0 - 17.0 g/dL   HCT 91.4 (L) 78.2 - 95.6 %   MCV 87.5 80.0 - 100.0 fL   MCH 26.7 26.0 - 34.0 pg   MCHC 30.5 30.0 - 36.0 g/dL   RDW 21.3 (H) 08.6 - 57.8 %   Platelets 237 150 - 400 K/uL   nRBC 0.0 0.0 - 0.2 %    Comment: Performed at [facility], [address] [place], [place] [address]  Ferritin     Status: Abnormal   Collection Time: 05/27/19 10:07 AM  Result Value Ref Range   Ferritin 454 (H) 24 - 336 ng/mL    Comment: Performed at [facility], [address] [place], [place] [address]  Iron and TIBC     Status: Abnormal   Collection Time: 05/27/19 10:07 AM  Result Value Ref Range   Iron 40 (L) 45 - 182 ug/dL   TIBC 132 440 - 102 ug/dL   Saturation Ratios 13 (L) 17.9 - 39.5 %   UIBC 271 ug/dL    Comment: Performed at [facility], [address] [place], [place] [address]  Comprehensive metabolic panel     Status: Abnormal   Collection Time: 05/27/19 12:15 PM  Result Value Ref Range   Sodium 137 135 - 145 mmol/L   Potassium 4.4 3.5 - 5.1 mmol/L   Chloride 106 98 - 111 mmol/L   CO2 24 22 - 32 mmol/L   Glucose, Bld 131 (H) 70 - 99 mg/dL   BUN 24 (H) 8 - 23 mg/dL   Creatinine, Ser 6.44 (H) 0.61 - 1.24 mg/dL   Calcium 8.5 (L) 8.9 - 10.3 mg/dL   Total Protein 7.1 6.5 - 8.1 g/dL   Albumin 2.8 (L) 3.5 - 5.0 g/dL   AST 68 (H) 15 - 41 U/L   ALT 61 (H) 0 - 44 U/L   Alkaline Phosphatase 96 38 - 126 U/L   Total Bilirubin 0.5 0.3 - 1.2 mg/dL   GFR calc non Af Amer 50 (L)  >60 mL/min   GFR calc Af Amer 58 (L) >60 mL/min   Anion gap 7 5 - 15    Comment: Performed at [facility], [address] [place], [place] [address]  Magnesium     Status: Abnormal   Collection Time: 05/27/19 12:15 PM  Result Value Ref Range   Magnesium 2.5 (H) 1.7 - 2.4 mg/dL    Comment: Performed at [facility], [address] [place], [place] [address]  TSH     Status: Abnormal   Collection Time: 05/27/19 12:15 PM  Result Value Ref Range   TSH 6.226 (H) 0.350 - 4.500 uIU/mL    Comment: Performed by a 3rd Generation assay with a functional sensitivity of <=0.01 uIU/mL. Performed at [facility], [address] [place], [place] [address]   CULTURE, BLOOD (ROUTINE X 2) w Reflex to ID Panel     Status: None   Collection Time: 05/27/19 12:15 PM   Specimen: BLOOD  Result Value Ref Range   Specimen Description BLOOD RIGHT ANTECUBITAL    Special Requests      BOTTLES DRAWN AEROBIC AND ANAEROBIC Blood Culture adequate  volume   Culture      NO GROWTH 5 DAYS Performed at [facility], [address] [place], [place] [address]    Report Status 06/01/2019 FINAL   CULTURE, BLOOD (ROUTINE X 2) w Reflex to ID Panel     Status: None   Collection Time: 05/27/19 12:15 PM   Specimen: BLOOD  Result Value Ref Range   Specimen Description BLOOD BLOOD LEFT ARM    Special Requests      BOTTLES DRAWN AEROBIC AND ANAEROBIC Blood Culture adequate volume   Culture      NO GROWTH 5 DAYS Performed at [facility], [address] [place], [place] [address]    Report Status 06/01/2019 FINAL   Glucose, capillary     Status: Abnormal   Collection Time: 05/27/19 12:29 PM  Result Value Ref Range   Glucose-Capillary 100 (H) 70 - 99 mg/dL  Urine culture     Status: Abnormal   Collection Time: 05/27/19  3:37 PM   Specimen: Urine, Random  Result Value Ref Range   Specimen Description      URINE, RANDOM Performed at [facility], [address] [place], [place] [address]    Special Requests      NONE Performed at [facility], [address], [place], [place] [address]    Culture (A)     <10,000 COLONIES/mL INSIGNIFICANT GROWTH Performed at [facility], [address], [place], [place] [address]    Report Status 05/29/2019 FINAL   Glucose, capillary     Status: None   Collection Time: 05/27/19  4:56 PM  Result Value Ref Range   Glucose-Capillary 82 70 - 99 mg/dL  CBC     Status: Abnormal   Collection Time: 05/27/19  7:58 PM  Result Value Ref Range   WBC 7.3 4.0 - 10.5 K/uL   RBC 3.81 (L) 4.22 - 5.81 MIL/uL   Hemoglobin 10.3 (L) 13.0 - 17.0 g/dL   HCT 78.4 (L) 69.6 - 29.5 %   MCV 86.9 80.0 - 100.0 fL   MCH 27.0 26.0 - 34.0 pg   MCHC 31.1 30.0 - 36.0 g/dL   RDW 28.4 (H) 13.2 - 44.0 %   Platelets 220 150 - 400 K/uL   nRBC 0.0 0.0 - 0.2 %    Comment: Performed at [facility], [address] [place], [place] [address]  MRSA PCR Screening     Status: None   Collection Time: 05/27/19  9:03 PM   Specimen: Nasal Mucosa; Nasopharyngeal  Result Value Ref Range   MRSA by PCR NEGATIVE NEGATIVE    Comment:        The GeneXpert MRSA Assay (FDA approved for NASAL specimens only), is one component of a comprehensive MRSA colonization surveillance program. It is not intended to diagnose MRSA infection nor to guide or monitor treatment for MRSA infections. Performed at [facility], [address] [place], [place] [address]   Glucose, capillary     Status: None   Collection Time: 05/27/19  9:05 PM  Result Value Ref Range   Glucose-Capillary 81 70 - 99 mg/dL  Hemoglobin and hematocrit, blood     Status: Abnormal   Collection Time: 05/27/19 10:29 PM  Result Value Ref Range   Hemoglobin 10.1 (L) 13.0 - 17.0 g/dL   HCT 40.3 (L) 47.4 - 25.9 %    Comment: Performed at [facility], [address] [place], [place] [address]  CBC  with Differential/Platelet      Status: Abnormal   Collection Time: 05/28/19  1:53 AM  Result Value Ref Range   WBC 9.8 4.0 - 10.5 K/uL   RBC 3.78 (L) 4.22 - 5.81 MIL/uL   Hemoglobin 10.1 (L) 13.0 - 17.0 g/dL   HCT 91.4 (L) 78.2 - 95.6 %   MCV 86.8 80.0 - 100.0 fL   MCH 26.7 26.0 - 34.0 pg   MCHC 30.8 30.0 - 36.0 g/dL   RDW 21.3 (H) 08.6 - 57.8 %   Platelets 204 150 - 400 K/uL   nRBC 0.0 0.0 - 0.2 %   Neutrophils Relative % 69 %   Neutro Abs 6.7 1.7 - 7.7 K/uL   Lymphocytes Relative 19 %   Lymphs Abs 1.8 0.7 - 4.0 K/uL   Monocytes Relative 11 %   Monocytes Absolute 1.0 0.1 - 1.0 K/uL   Eosinophils Relative 1 %   Eosinophils Absolute 0.1 0.0 - 0.5 K/uL   Basophils Relative 0 %   Basophils Absolute 0.0 0.0 - 0.1 K/uL   Immature Granulocytes 0 %   Abs Immature Granulocytes 0.04 0.00 - 0.07 K/uL   Acanthocytes PRESENT    Schistocytes PRESENT    Ovalocytes PRESENT     Comment: Performed at [facility], [address] [place], [place] [address]  Basic metabolic panel     Status: Abnormal   Collection Time: 05/28/19  1:53 AM  Result Value Ref Range   Sodium 138 135 - 145 mmol/L   Potassium 3.8 3.5 - 5.1 mmol/L   Chloride 111 98 - 111 mmol/L   CO2 22 22 - 32 mmol/L   Glucose, Bld 151 (H) 70 - 99 mg/dL   BUN 20 8 - 23 mg/dL   Creatinine, Ser 9.52 (H) 0.61 - 1.24 mg/dL   Calcium 8.0 (L) 8.9 - 10.3 mg/dL   GFR calc non Af Amer 51 (L) >60 mL/min   GFR calc Af Amer 59 (L) >60 mL/min   Anion gap 5 5 - 15    Comment: Performed at [facility], [address] [place], [place] [address]  Phosphorus     Status: None   Collection Time: 05/28/19  1:53 AM  Result Value Ref Range   Phosphorus 3.2 2.5 - 4.6 mg/dL    Comment: Performed at [facility], [address] [place], [place] [address]  Magnesium     Status: None   Collection Time: 05/28/19  1:53 AM  Result Value Ref Range   Magnesium 2.2 1.7 - 2.4 mg/dL    Comment: Performed at [facility], [address]  [place], [place] [address]  Lactic acid, plasma     Status: None   Collection Time: 05/28/19  1:53 AM  Result Value Ref Range   Lactic Acid, Venous 0.8 0.5 - 1.9 mmol/L    Comment: Performed at [facility], [address] [place], [place] [address]  Procalcitonin - Baseline     Status: None   Collection Time: 05/28/19  1:53 AM  Result Value Ref Range   Procalcitonin <0.10 ng/mL    Comment:        Interpretation: PCT (Procalcitonin) <= 0.5 ng/mL: Systemic infection (sepsis) is not likely. Local bacterial infection is possible. (NOTE)       Sepsis PCT Algorithm           Lower Respiratory Tract  Infection PCT Algorithm    ----------------------------     ----------------------------         PCT < 0.25 ng/mL                PCT < 0.10 ng/mL         Strongly encourage             Strongly discourage   discontinuation of antibiotics    initiation of antibiotics    ----------------------------     -----------------------------       PCT 0.25 - 0.50 ng/mL            PCT 0.10 - 0.25 ng/mL               OR       >80% decrease in PCT            Discourage initiation of                                            antibiotics      Encourage discontinuation           of antibiotics    ----------------------------     -----------------------------         PCT >= 0.50 ng/mL              PCT 0.26 - 0.50 ng/mL               AND        <80% decrease in PCT             Encourage initiation of                                             antibiotics       Encourage continuation           of antibiotics    ----------------------------     -----------------------------        PCT >= 0.50 ng/mL                  PCT > 0.50 ng/mL               AND         increase in PCT                  Strongly encourage                                      initiation of antibiotics    Strongly encourage escalation           of antibiotics                                      -----------------------------                                           PCT <= 0.25 ng/mL  OR                                        > 80% decrease in PCT                                     Discontinue / Do not initiate                                             antibiotics Performed at [facility], [address] [place], [place] [address]   SARS Coronavirus 2 (CEPHEID - Performed in [facility] hospital lab), Hosp Order     Status: None   Collection Time: 05/28/19  2:16 AM   Specimen: Nasopharyngeal Swab  Result Value Ref Range   SARS Coronavirus 2 NEGATIVE NEGATIVE    Comment: (NOTE) If result is NEGATIVE SARS-CoV-2 target nucleic acids are NOT DETECTED. The SARS-CoV-2 RNA is generally detectable in upper and lower  respiratory specimens during the acute phase of infection. The lowest  concentration of SARS-CoV-2 viral copies this assay can detect is 250  copies / mL. A negative result does not preclude SARS-CoV-2 infection  and should not be used as the sole basis for treatment or other  patient management decisions.  A negative result may occur with  improper specimen collection / handling, submission of specimen other  than nasopharyngeal swab, presence of viral mutation(s) within the  areas targeted by this assay, and inadequate number of viral copies  (<250 copies / mL). A negative result must be combined with clinical  observations, patient history, and epidemiological information. If result is POSITIVE SARS-CoV-2 target nucleic acids are DETECTED. The SARS-CoV-2 RNA is generally detectable in upper and lower  respiratory specimens dur ing the acute phase of infection.  Positive  results are indicative of active infection with SARS-CoV-2.  Clinical  correlation with patient history and other diagnostic information is  necessary to determine patient infection status.  Positive results do  not rule out bacterial  infection or co-infection with other viruses. If result is PRESUMPTIVE POSTIVE SARS-CoV-2 nucleic acids MAY BE PRESENT.   A presumptive positive result was obtained on the submitted specimen  and confirmed on repeat testing.  While 2019 novel coronavirus  (SARS-CoV-2) nucleic acids may be present in the submitted sample  additional confirmatory testing may be necessary for epidemiological  and / or clinical management purposes  to differentiate between  SARS-CoV-2 and other Sarbecovirus currently known to infect humans.  If clinically indicated additional testing with an alternate test  methodology [phone]) is advised. The SARS-CoV-2 RNA is generally  detectable in upper and lower respiratory sp ecimens during the acute  phase of infection. The expected result is Negative. Fact Sheet for Patients:  BoilerBrush.com.cy Fact Sheet for Healthcare Providers: https://pope.com/ This test is not yet approved or cleared by the [place] FDA and has been authorized for detection and/or diagnosis of SARS-CoV-2 by FDA under an Emergency Use Authorization (EUA).  This EUA will remain in effect (meaning this test can be used) for the duration of the COVID-19 declaration under Section 564(b)(1) of the Act, 21 U.S.C. section 360bbb-3(b)(1), unless the authorization is terminated or revoked sooner.  Performed at [facility], [address] [place], [place] [address]   Hemoglobin and hematocrit, blood     Status: Abnormal   Collection Time: 05/28/19  6:16 AM  Result Value Ref Range   Hemoglobin 10.3 (L) 13.0 - 17.0 g/dL   HCT 60.4 (L) 54.0 - 98.1 %    Comment: Performed at [facility], [address] [place], [place] [address]  Hemoglobin and hematocrit, blood     Status: Abnormal   Collection Time: 05/28/19 10:31 AM  Result Value Ref Range   Hemoglobin 10.2 (L) 13.0 - 17.0 g/dL   HCT 82.9 (L) 56.2 - 13.0 %    Comment:  Performed at [facility], [address] [place], [place] [address]  Glucose, capillary     Status: None   Collection Time: 05/28/19  1:19 PM  Result Value Ref Range   Glucose-Capillary 84 70 - 99 mg/dL  Glucose, capillary     Status: None   Collection Time: 05/28/19  3:28 PM  Result Value Ref Range   Glucose-Capillary 85 70 - 99 mg/dL  Hemoglobin and hematocrit, blood     Status: Abnormal   Collection Time: 05/28/19  4:06 PM  Result Value Ref Range   Hemoglobin 10.9 (L) 13.0 - 17.0 g/dL   HCT 46.9 (L) 62.9 - 52.8 %    Comment: Performed at [facility], [address] [place], [place] [address]  Hemoglobin and hematocrit, blood     Status: Abnormal   Collection Time: 05/28/19  8:15 PM  Result Value Ref Range   Hemoglobin 10.7 (L) 13.0 - 17.0 g/dL   HCT 40.1 (L) 02.7 - 25.3 %    Comment: Performed at [facility], [address] [place], [place] [address]  Hemoglobin and hematocrit, blood     Status: Abnormal   Collection Time: 05/29/19 12:10 AM  Result Value Ref Range   Hemoglobin 10.1 (L) 13.0 - 17.0 g/dL   HCT 34.7 (L) 42.5 - 95.6 %    Comment: Performed at [facility], [address] [place], [place] [address]  Procalcitonin     Status: None   Collection Time: 05/29/19  5:31 AM  Result Value Ref Range   Procalcitonin <0.10 ng/mL    Comment:        Interpretation: PCT (Procalcitonin) <= 0.5 ng/mL: Systemic infection (sepsis) is not likely. Local bacterial infection is possible. (NOTE)       Sepsis PCT Algorithm           Lower Respiratory Tract                                      Infection PCT Algorithm    ----------------------------     ----------------------------         PCT < 0.25 ng/mL                PCT < 0.10 ng/mL         Strongly encourage             Strongly discourage   discontinuation of antibiotics    initiation of antibiotics    ----------------------------     -----------------------------       PCT 0.25 -  0.50 ng/mL            PCT 0.10 - 0.25 ng/mL  OR       >80% decrease in PCT            Discourage initiation of                                            antibiotics      Encourage discontinuation           of antibiotics    ----------------------------     -----------------------------         PCT >= 0.50 ng/mL              PCT 0.26 - 0.50 ng/mL               AND        <80% decrease in PCT             Encourage initiation of                                             antibiotics       Encourage continuation           of antibiotics    ----------------------------     -----------------------------        PCT >= 0.50 ng/mL                  PCT > 0.50 ng/mL               AND         increase in PCT                  Strongly encourage                                      initiation of antibiotics    Strongly encourage escalation           of antibiotics                                     -----------------------------                                           PCT <= 0.25 ng/mL                                                 OR                                        > 80% decrease in PCT                                     Discontinue / Do not initiate  antibiotics Performed at [facility], [address] [place], [place] [address]   CBC     Status: Abnormal   Collection Time: 05/29/19  5:31 AM  Result Value Ref Range   WBC 11.5 (H) 4.0 - 10.5 K/uL   RBC 4.02 (L) 4.22 - 5.81 MIL/uL   Hemoglobin 10.8 (L) 13.0 - 17.0 g/dL   HCT 13.0 (L) 86.5 - 78.4 %   MCV 88.3 80.0 - 100.0 fL   MCH 26.9 26.0 - 34.0 pg   MCHC 30.4 30.0 - 36.0 g/dL   RDW 69.6 (H) 29.5 - 28.4 %   Platelets 225 150 - 400 K/uL   nRBC 0.0 0.0 - 0.2 %    Comment: Performed at [facility], [address] [place], [place] [address]  Comprehensive metabolic panel     Status: Abnormal   Collection Time: 05/29/19  5:31 AM  Result Value Ref  Range   Sodium 139 135 - 145 mmol/L   Potassium 3.9 3.5 - 5.1 mmol/L   Chloride 110 98 - 111 mmol/L   CO2 20 (L) 22 - 32 mmol/L   Glucose, Bld 133 (H) 70 - 99 mg/dL   BUN 20 8 - 23 mg/dL   Creatinine, Ser 0.10 (H) 0.61 - 1.24 mg/dL   Calcium 8.1 (L) 8.9 - 10.3 mg/dL   Total Protein 6.5 6.5 - 8.1 g/dL   Albumin 2.8 (L) 3.5 - 5.0 g/dL   AST 53 (H) 15 - 41 U/L   ALT 43 0 - 44 U/L   Alkaline Phosphatase 80 38 - 126 U/L   Total Bilirubin 1.0 0.3 - 1.2 mg/dL   GFR calc non Af Amer 55 (L) >60 mL/min   GFR calc Af Amer >60 >60 mL/min   Anion gap 9 5 - 15    Comment: Performed at [facility], [address], [place], [place] [address]  Magnesium     Status: None   Collection Time: 05/29/19  5:31 AM  Result Value Ref Range   Magnesium 2.2 1.7 - 2.4 mg/dL    Comment: Performed at [facility], [address] [place], [place] [address]  Phosphorus     Status: None   Collection Time: 05/29/19  5:31 AM  Result Value Ref Range   Phosphorus 3.5 2.5 - 4.6 mg/dL    Comment: Performed at [facility], [address] [place], [place] [address]  APTT     Status: None   Collection Time: 05/29/19  3:21 PM  Result Value Ref Range   aPTT 32 24 - 36 seconds    Comment: Performed at [facility], [address] [place], [place] [address]  Protime-INR     Status: Abnormal   Collection Time: 05/29/19  3:21 PM  Result Value Ref Range   Prothrombin Time 15.8 (H) 11.4 - 15.2 seconds   INR 1.3 (H) 0.8 - 1.2    Comment: (NOTE) INR goal varies based on device and disease states. Performed at [facility], [address] [place], [place] [address]   Heparin level (unfractionated)     Status: Abnormal   Collection Time: 05/30/19  1:02 AM  Result Value Ref Range   Heparin Unfractionated 0.14 (L) 0.30 - 0.70 IU/mL    Comment: (NOTE) If heparin results are below expected values, and patient dosage has  been confirmed, suggest follow up testing of  antithrombin III levels. Performed at [facility], [address], [place], [place]  [address]   Procalcitonin     Status: None   Collection Time: 05/30/19  1:02 AM  Result Value Ref Range   Procalcitonin <0.10 ng/mL    Comment:        Interpretation: PCT (Procalcitonin) <= 0.5 ng/mL: Systemic infection (sepsis) is not likely. Local bacterial infection is possible. (NOTE)       Sepsis PCT Algorithm           Lower Respiratory Tract                                      Infection PCT Algorithm    ----------------------------     ----------------------------         PCT < 0.25 ng/mL                PCT < 0.10 ng/mL         Strongly encourage             Strongly discourage   discontinuation of antibiotics    initiation of antibiotics    ----------------------------     -----------------------------       PCT 0.25 - 0.50 ng/mL            PCT 0.10 - 0.25 ng/mL               OR       >80% decrease in PCT            Discourage initiation of                                            antibiotics      Encourage discontinuation           of antibiotics    ----------------------------     -----------------------------         PCT >= 0.50 ng/mL              PCT 0.26 - 0.50 ng/mL               AND        <80% decrease in PCT             Encourage initiation of                                             antibiotics       Encourage continuation           of antibiotics    ----------------------------     -----------------------------        PCT >= 0.50 ng/mL                  PCT > 0.50 ng/mL               AND         increase in PCT                  Strongly encourage  initiation of antibiotics    Strongly encourage escalation           of antibiotics                                     -----------------------------                                           PCT <= 0.25 ng/mL                                                 OR                                         > 80% decrease in PCT                                     Discontinue / Do not initiate                                             antibiotics Performed at [facility], [address] [place], [place] [address]   CBC     Status: Abnormal   Collection Time: 05/30/19  1:02 AM  Result Value Ref Range   WBC 6.2 4.0 - 10.5 K/uL   RBC 3.98 (L) 4.22 - 5.81 MIL/uL   Hemoglobin 10.5 (L) 13.0 - 17.0 g/dL   HCT 60.4 (L) 54.0 - 98.1 %   MCV 86.7 80.0 - 100.0 fL   MCH 26.4 26.0 - 34.0 pg   MCHC 30.4 30.0 - 36.0 g/dL   RDW 19.1 (H) 47.8 - 29.5 %   Platelets 220 150 - 400 K/uL   nRBC 0.0 0.0 - 0.2 %    Comment: Performed at [facility], [address] [place], [place] [address]  Basic metabolic panel     Status: Abnormal   Collection Time: 05/30/19  1:02 AM  Result Value Ref Range   Sodium 138 135 - 145 mmol/L   Potassium 3.8 3.5 - 5.1 mmol/L   Chloride 110 98 - 111 mmol/L   CO2 24 22 - 32 mmol/L   Glucose, Bld 146 (H) 70 - 99 mg/dL   BUN 23 8 - 23 mg/dL   Creatinine, Ser 8.65 (H) 0.61 - 1.24 mg/dL   Calcium 7.7 (L) 8.9 - 10.3 mg/dL   GFR calc non Af Amer 49 (L) >60 mL/min   GFR calc Af Amer 57 (L) >60 mL/min   Anion gap 4 (L) 5 - 15    Comment: Performed at [facility], [address] [place], [place] [address]  Heparin level (unfractionated)     Status: None   Collection Time: 05/30/19  8:28 AM  Result Value Ref Range   Heparin Unfractionated 0.30 0.30 - 0.70 IU/mL    Comment: (NOTE) If heparin results are below expected values,  and patient dosage has  been confirmed, suggest follow up testing of antithrombin III levels. Performed at [facility], [address] [place], [place] [address]   Heparin level (unfractionated)     Status: Abnormal   Collection Time: 05/30/19  4:23 PM  Result Value Ref Range   Heparin Unfractionated 0.23 (L) 0.30 - 0.70 IU/mL    Comment: (NOTE) If heparin results are below expected  values, and patient dosage has  been confirmed, suggest follow up testing of antithrombin III levels. Performed at [facility], [address] [place], [place] [address]   Heparin level (unfractionated)     Status: None   Collection Time: 05/30/19 11:08 PM  Result Value Ref Range   Heparin Unfractionated 0.32 0.30 - 0.70 IU/mL    Comment: (NOTE) If heparin results are below expected values, and patient dosage has  been confirmed, suggest follow up testing of antithrombin III levels. Performed at [facility], [address] [place], [place] [address]   CBC     Status: Abnormal   Collection Time: 05/31/19  5:26 AM  Result Value Ref Range   WBC 6.5 4.0 - 10.5 K/uL   RBC 3.92 (L) 4.22 - 5.81 MIL/uL   Hemoglobin 10.5 (L) 13.0 - 17.0 g/dL   HCT 09.2 (L) 95.7 - 47.3 %   MCV 86.2 80.0 - 100.0 fL   MCH 26.8 26.0 - 34.0 pg   MCHC 31.1 30.0 - 36.0 g/dL   RDW 40.3 (H) 70.9 - 64.3 %   Platelets 213 150 - 400 K/uL   nRBC 0.0 0.0 - 0.2 %    Comment: Performed at [facility], [address] [place], [place] [address]  Basic metabolic panel     Status: Abnormal   Collection Time: 05/31/19  5:26 AM  Result Value Ref Range   Sodium 140 135 - 145 mmol/L   Potassium 3.7 3.5 - 5.1 mmol/L   Chloride 111 98 - 111 mmol/L   CO2 24 22 - 32 mmol/L   Glucose, Bld 102 (H) 70 - 99 mg/dL   BUN 19 8 - 23 mg/dL   Creatinine, Ser 4.03 0.61 - 1.24 mg/dL   Calcium 7.9 (L) 8.9 - 10.3 mg/dL   GFR calc non Af Amer >60 >60 mL/min   GFR calc Af Amer >60 >60 mL/min   Anion gap 5 5 - 15    Comment: Performed at [facility], [address], [place], [place] [address]  Magnesium     Status: None   Collection Time: 05/31/19  5:26 AM  Result Value Ref Range   Magnesium 2.4 1.7 - 2.4 mg/dL    Comment: Performed at [facility], [address] [place], [place] [address]  Phosphorus     Status: Abnormal   Collection Time: 05/31/19  5:26 AM  Result Value Ref  Range   Phosphorus 2.3 (L) 2.5 - 4.6 mg/dL    Comment: Performed at [facility], [address] [place], [place] [address]  Heparin level (unfractionated)     Status: None   Collection Time: 05/31/19  5:26 AM  Result Value Ref Range   Heparin Unfractionated 0.30 0.30 - 0.70 IU/mL    Comment: (NOTE) If heparin results are below expected values, and patient dosage has  been confirmed, suggest follow up testing of antithrombin III levels. Performed at [facility], [address] [place], [place] [address]   Heparin level (unfractionated)  Status: None   Collection Time: 05/31/19  1:46 PM  Result Value Ref Range   Heparin Unfractionated 0.55 0.30 - 0.70 IU/mL    Comment: (NOTE) If heparin results are below expected values, and patient dosage has  been confirmed, suggest follow up testing of antithrombin III levels. Performed at [facility], [address] [place], [place] [address]   Albumin     Status: Abnormal   Collection Time: 05/31/19  1:46 PM  Result Value Ref Range   Albumin 2.5 (L) 3.5 - 5.0 g/dL    Comment: Performed at [facility], [address] [place], [place] [address]  Heparin level (unfractionated)     Status: None   Collection Time: 05/31/19  8:46 PM  Result Value Ref Range   Heparin Unfractionated 0.60 0.30 - 0.70 IU/mL    Comment: (NOTE) If heparin results are below expected values, and patient dosage has  been confirmed, suggest follow up testing of antithrombin III levels. Performed at [facility], [address] [place], [place] [address]   Basic metabolic panel     Status: Abnormal   Collection Time: 06/01/19  4:39 AM  Result Value Ref Range   Sodium 138 135 - 145 mmol/L   Potassium 3.8 3.5 - 5.1 mmol/L   Chloride 109 98 - 111 mmol/L   CO2 22 22 - 32 mmol/L   Glucose, Bld 102 (H) 70 - 99 mg/dL   BUN 17 8 - 23 mg/dL   Creatinine, Ser 9.14 0.61 - 1.24 mg/dL   Calcium 8.0 (L) 8.9 - 10.3  mg/dL   GFR calc non Af Amer >60 >60 mL/min   GFR calc Af Amer >60 >60 mL/min   Anion gap 7 5 - 15    Comment: Performed at [facility], [address] [place], [place] [address]  CBC     Status: Abnormal   Collection Time: 06/01/19  4:39 AM  Result Value Ref Range   WBC 5.6 4.0 - 10.5 K/uL   RBC 3.69 (L) 4.22 - 5.81 MIL/uL   Hemoglobin 10.0 (L) 13.0 - 17.0 g/dL   HCT 62.1 (L) 30.8 - 65.7 %   MCV 87.5 80.0 - 100.0 fL   MCH 27.1 26.0 - 34.0 pg   MCHC 31.0 30.0 - 36.0 g/dL   RDW 84.6 (H) 96.2 - 95.2 %   Platelets 198 150 - 400 K/uL   nRBC 0.0 0.0 - 0.2 %    Comment: Performed at [facility], [address] [place], [place] [address]  Heparin level (unfractionated)     Status: None   Collection Time: 06/01/19  4:39 AM  Result Value Ref Range   Heparin Unfractionated 0.55 0.30 - 0.70 IU/mL    Comment: (NOTE) If heparin results are below expected values, and patient dosage has  been confirmed, suggest follow up testing of antithrombin III levels. Performed at [facility], [address] [place], [place] [address]   Phosphorus     Status: None   Collection Time: 06/01/19  4:39 AM  Result Value Ref Range   Phosphorus 2.6 2.5 - 4.6 mg/dL    Comment: Performed at [facility], [address] [place], [place] [address]  CBC w/Diff/Platelet     Status: Abnormal   Collection Time: 06/09/19 12:06 PM  Result Value Ref Range   WBC 6.6 3.4 - 10.8 x10E3/uL   RBC 4.23 4.14 - 5.80 x10E6/uL    Comment: Acanthocytes present.  Hemoglobin 11.7 (L) 13.0 - 17.7 g/dL   Hematocrit 81.136.9 (L) 91.437.5 - 51.0 %   MCV 87 79 - 97 fL   MCH 27.7 26.6 - 33.0 pg   MCHC 31.7 31.5 - 35.7 g/dL   RDW 78.223.2 (H) 95.611.6 - 21.315.4 %   Platelets 187 150 - 450 x10E3/uL    Comment: Platelets appear clumped.   Neutrophils 46 Not Estab. %   Lymphs 42 Not Estab. %   Monocytes 10 Not Estab. %   Eos 1 Not Estab. %   Basos 1 Not Estab. %   Neutrophils Absolute 3.1 1.4 - 7.0  x10E3/uL   Lymphocytes Absolute 2.8 0.7 - 3.1 x10E3/uL   Monocytes Absolute 0.7 0.1 - 0.9 x10E3/uL   EOS (ABSOLUTE) 0.1 0.0 - 0.4 x10E3/uL   Basophils Absolute 0.0 0.0 - 0.2 x10E3/uL   Immature Granulocytes 0 Not Estab. %   Immature Grans (Abs) 0.0 0.0 - 0.1 x10E3/uL   Hematology Comments: Note:     Comment: Verified by microscopic examination.  Basic Metabolic Panel (BMET)     Status: Abnormal   Collection Time: 06/09/19 12:06 PM  Result Value Ref Range   Glucose 110 (H) 65 - 99 mg/dL   BUN 24 8 - 27 mg/dL   Creatinine, Ser 0.861.07 0.76 - 1.27 mg/dL   GFR calc non Af Amer 68 >59 mL/min/1.73   GFR calc Af Amer 78 >59 mL/min/1.73   BUN/Creatinine Ratio 22 10 - 24   Sodium 143 134 - 144 mmol/L   Potassium 4.8 3.5 - 5.2 mmol/L   Chloride 105 96 - 106 mmol/L   CO2 22 20 - 29 mmol/L   Calcium 8.9 8.6 - 10.2 mg/dL  LDL cholesterol, direct     Status: None   Collection Time: 06/10/19 11:27 AM  Result Value Ref Range   LDL Direct 50 0 - 99 mg/dL  Lipid panel     Status: Abnormal   Collection Time: 06/10/19 11:30 AM  Result Value Ref Range   Cholesterol, Total 108 100 - 199 mg/dL   Triglycerides 578124 0 - 149 mg/dL   HDL 33 (L) >46>39 mg/dL   VLDL Cholesterol Cal 25 5 - 40 mg/dL   LDL Calculated 50 0 - 99 mg/dL   Chol/HDL Ratio 3.3 0.0 - 5.0 ratio    Comment:                                   T. Chol/HDL Ratio                                             Men  Women                               1/2 Avg.Risk  3.4    3.3                                   Avg.Risk  5.0    4.4                                2X Avg.Risk  9.6  7.1                                3X Avg.Risk 23.4   11.0   Hepatic function panel     Status: Abnormal   Collection Time: 06/10/19 11:30 AM  Result Value Ref Range   Total Protein 6.8 6.0 - 8.5 g/dL   Albumin 3.6 (L) 3.7 - 4.7 g/dL   Bilirubin Total 0.2 0.0 - 1.2 mg/dL   Bilirubin, Direct 0.98 0.00 - 0.40 mg/dL   Alkaline Phosphatase 98 39 - 117 IU/L   AST 58 (H)  0 - 40 IU/L   ALT 45 (H) 0 - 44 IU/L  Basic metabolic panel     Status: Abnormal   Collection Time: 06/17/19  8:49 AM  Result Value Ref Range   Sodium 138 135 - 145 mmol/L   Potassium 4.0 3.5 - 5.1 mmol/L   Chloride 105 98 - 111 mmol/L   CO2 24 22 - 32 mmol/L   Glucose, Bld 139 (H) 70 - 99 mg/dL   BUN 28 (H) 8 - 23 mg/dL   Creatinine, Ser 1.19 0.61 - 1.24 mg/dL   Calcium 8.9 8.9 - 14.7 mg/dL   GFR calc non Af Amer >60 >60 mL/min   GFR calc Af Amer >60 >60 mL/min   Anion gap 9 5 - 15    Comment: Performed at [facility], [address] [place], [place] [address]    Radiology Dg Chest 2 View  Result Date: 06/09/2019 CLINICAL DATA:  Aspiration pneumonia.  Shortness of breath.  Cough. EXAM: CHEST - 2 VIEW COMPARISON:  05/17/2019 FINDINGS: Heart size is normal. Aortic atherosclerosis as seen previously. Brachiocephalic vessel stent. The lungs are now clear. Resolution of previously seen bilateral infiltrates. Mild chronically prominent interstitial markings. No consolidation, collapse or effusion. No acute bone finding. IMPRESSION: Resolution of previously seen bilateral infiltrates. No active disease presently. Electronically Signed   By: Paulina Fusi M.D.   On: 06/09/2019 17:24   Dg Vangie Bicker G Tube Plc W/fl W/rad  Result Date: 05/27/2019 CLINICAL DATA:  The off tube placement EXAM: NASO G TUBE PLACEMENT WITH FL AND WITH RAD CONTRAST:  20 mL of Omnipaque 300 FLUOROSCOPY TIME:  Fluoroscopy Time:  4 minutes and 18 seconds Number of Acquired Spot Images: 0 COMPARISON:  None. FINDINGS: A Dobbhoff tube was placed through the left nostril and advanced through the stomach into the distal duodenum. The distal tip is near the ligament of Treitz. IMPRESSION: Appropriate Dobbhoff tube placement as above. Electronically Signed   By: Gerome Sam III M.D   On: 05/27/2019 14:57   Ct Angio Abd/pel W/ And/or W/o  Result Date: 05/27/2019 CLINICAL DATA:  78 year old male with infectious  gastroenteritis/colitis. Evaluate for mesenteric ischemia. EXAM: CT ANGIOGRAPHY ABDOMEN AND PELVIS WITH CONTRAST AND WITHOUT CONTRAST TECHNIQUE: Multidetector CT imaging of the abdomen and pelvis was performed using the standard protocol during bolus administration of intravenous contrast. Multiplanar reconstructed images and MIPs were obtained and reviewed to evaluate the vascular anatomy. CONTRAST:  ISOVUE-370 IOPAMIDOL (ISOVUE-370) INJECTION 76% COMPARISON:  None. FINDINGS: VASCULAR Aorta: Extensive calcified atherosclerotic plaque throughout the abdominal aorta. No evidence of aneurysm or dissection. There is bulky [place]al reef like plaque in the visceral aorta resulting in approximately 40% luminal loss. Celiac: Heavily calcified atherosclerotic plaque results in high-grade stenosis at the origin of the celiac artery. SMA: Heavily calcified atherosclerotic plaque with an additional  fibrofatty component results in high-grade stenosis and probable occlusion of the proximal superior mesenteric artery. Renals: Heavy calcified atherosclerotic plaque along both renal arteries without evidence of significant stenosis. No changes of fibromuscular dysplasia. IMA: Hypertrophic inferior mesenteric artery provides the dominant blood supply to the superior mesenteric artery. Inflow: Bulky calcified atherosclerotic plaque bilaterally. Likely multifocal moderate to advanced stenosis throughout the iliac systems. Proximal Outflow: Bulky [place] reef like atherosclerotic plaque in the right common femoral artery results in a greater than 60% diameter stenosis. Fairly minimal plaque in the left common femoral artery. Heavily diseased superficial femoral arteries bilaterally with chronic occlusion of the right SFA. Veins: No focal venous abnormality or evidence of thrombus. Review of the MIP images confirms the above findings. NON-VASCULAR Lower chest: Dependent atelectasis in the lower lobes. Patchy areas of tree-in-bud micro  nodularity in a peribronchovascular distribution in the dependent portions of the lower lungs. The heart is normal in size. Unremarkable distal thoracic esophagus. Hepatobiliary: Normal hepatic contour and morphology. No discrete hepatic lesions. Normal appearance of the gallbladder. No intra or extrahepatic biliary ductal dilatation. Pancreas: Unremarkable. No pancreatic ductal dilatation or surrounding inflammatory changes. Spleen: Normal in size without focal abnormality. Adrenals/Urinary Tract: Adrenal glands are unremarkable. Kidneys are normal, without renal calculi, focal lesion, or hydronephrosis. Bladder is unremarkable. Stomach/Bowel: No focal bowel wall thickening or evidence of obstruction. Lymphatic: No suspicious lymphadenopathy. Reproductive: Prostatomegaly. Other: No abdominal wall hernia or abnormality. No abdominopelvic ascites. Musculoskeletal: No acute fracture or aggressive appearing lytic or blastic osseous lesion. IMPRESSION: VASCULAR 1. 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 Aortic Atherosclerosis (ICD10-170.0) 2. 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 3. 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 4. The hypertrophic inferior mesenteric artery provides collateral flow to the superior mesenteric artery. 5. Extensive peripheral arterial disease with heavily calcified atherosclerotic plaque throughout the iliac arteries with areas of mild to moderate stenosis bilaterally. 6. Incompletely imaged chronic total occlusion of the right superficial femoral artery. NON-VASCULAR 1. No focal bowel wall thickening or evidence of infection/inflammation at this time. 2. Nonspecific tree-in-bud micro nodularity in the dependent portions of the visualized lower lungs may represent chronic or subclinical  aspiration. 3. Prostatomegaly. Signed, Sterling Big, MD, RPVI [facility] Radiology Electronically Signed   By: Malachy Moan M.D.   On: 05/27/2019 14:52    Assessment/Plan  Essential hypertension blood pressure control important in reducing the progression of atherosclerotic disease. On appropriate oral medications.   Type 2 diabetes mellitus with stage 3 chronic kidney disease, without long-term current use of insulin (HCC) blood glucose control important in reducing the progression of atherosclerotic disease. Also, involved in wound healing. On appropriate medications.   Chronic mesenteric ischemia (HCC) The patient was literally near-death in the hospital a few weeks ago requiring pressors to stabilize his blood pressure and essentially dying from chronic visceral ischemia.  He looks like a new man today.  He still thin and has a ways to go, but his symptoms are markedly improved after intervention.  SMA stenosis (HCC) His duplex today shows mildly elevated velocities in the SMA stent but otherwise with good flow.  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 His symptoms are markedly improved.  He will continue his current medical regimen and I have asked him to slowly  increase his eating.  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  We will plan to see him back in 6 months with noninvasive studies or anytime sooner if symptoms develop in the    Festus Barren, MD  06/18/2019 10:03 AM    This note was created with Dragon medical transcription system.  Any errors from dictation are purely unintentional

## 2019-06-18 NOTE — Assessment & Plan Note (Signed)
His duplex today shows mildly elevated velocities in the SMA stent but otherwise with good flow.  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 His symptoms are markedly improved.  Kevin Case will continue his current medical regimen and I have asked him to slowly increase his eating.  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  We will plan to see him back in 6 months with noninvasive studies or anytime sooner if symptoms develop in the

## 2019-06-22 ENCOUNTER — Encounter: Payer: Self-pay | Admitting: Physician Assistant

## 2019-06-22 ENCOUNTER — Other Ambulatory Visit: Payer: Self-pay

## 2019-06-22 ENCOUNTER — Ambulatory Visit (INDEPENDENT_AMBULATORY_CARE_PROVIDER_SITE_OTHER): Payer: Medicare Other | Admitting: Gastroenterology

## 2019-06-22 ENCOUNTER — Encounter: Payer: Self-pay | Admitting: Gastroenterology

## 2019-06-22 VITALS — BP 108/50 | HR 75 | Temp 97.5°F | Resp 17 | Ht 69.0 in | Wt 138.2 lb

## 2019-06-22 DIAGNOSIS — K559 Vascular disorder of intestine, unspecified: Secondary | ICD-10-CM

## 2019-06-22 DIAGNOSIS — D509 Iron deficiency anemia, unspecified: Secondary | ICD-10-CM

## 2019-06-22 DIAGNOSIS — K5289 Other specified noninfective gastroenteritis and colitis: Secondary | ICD-10-CM

## 2019-06-22 MED ORDER — FERROUS SULFATE 325 (65 FE) MG PO TBEC: 325.0000 mg | DELAYED_RELEASE_TABLET | Freq: Two times a day (BID) | ORAL | 3 refills | Status: DC

## 2019-06-22 NOTE — Progress Notes (Signed)
[name], MD [address]  [place]  [place], [place] [address]  Main: [phone]  Fax: [phone]    Gastroenterology Consultation  Referring Provider:     Paulene Floor Primary Care Physician:  Paulene Floor Primary Gastroenterologist:  Dr. [name] Reason for Consultation:     Hospital follow-up, ischemic gastritis        HPI:   Kevin Case is a 78 y.o. male referred by Dr. Trinna Post, PA-C  for consultation & management of hospital follow-up, ischemic gastritis.  Patient with known history of peptic ulcer disease, presented to [facility] with hematemesis in end of July.  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  Patient has nicely recovered from this acute event and has done tremendously well since discharge.  His appetite is restored to normal, eating 3 meals a day, gained few pounds.  He is also having protein shake in the morning after breakfast.  He denies any abdominal pain, nausea or vomiting, black stools.  He is taking omeprazole 40 mg twice daily, Plavix 75 mg daily.  He did not have any concerns today.  He is taking oral iron 2 pills a day, B12 1 pill daily  NSAIDs: None  Antiplts/Anticoagulants/Anti thrombotics: Plavix  GI Procedures: EGD 05/27/2019 - Normal duodenal bulb and second portion of the duodenum. - Oozing gastric ulcers with oozing hemorrhage ([place] Class Ib). Injected. - Ischemic gastritis resulting in necrotic ulcer in gastric body - Normal gastroesophageal junction and esophagus. - No specimens collected.  Past Medical History:  Diagnosis Date  . Anemia   . Blood transfusion without reported diagnosis   . CAD (coronary artery disease)    a. 03/2019 Cath: LM nl, LAD 50p CA2+, 104m, D1 50ost, LCX 20p, 18m, RCA small, mild diff  dzs-->Med rx. Consider PCI LCX for refractory angina.  . Carotid arterial disease ([place])    a. 03/2019 Carotid U/S: RICA 7-85%, RECA <88%, LICA 5-02%, LECA <77%.  . CHF (congestive heart failure) ([place])   . Clotting disorder ([place])   . Diabetes mellitus without complication ([place])   . GIB (gastrointestinal bleeding)    a. 03/2019 BRBPR following PV procedure req anticoagulation-->2u PRBCs.  . Heart murmur   . HFrEF (heart failure with reduced ejection fraction) ([place])    a. 03/2019 Echo: EF 30-35%, mild conc LVH. Mildly dil LA. Mod MV annular dil. Mod AS (mean grad 94mmHg, Valve area 0.76).  Marland Kitchen History of tobacco abuse   . Hypertension   . Ischemic cardiomyopathy    a. 03/2019 Echo: EF 30-35%.  . Left bundle branch block   . Moderate aortic stenosis    a. 03/2019 Echo:  Mod AS (mean grad 37mmHg, Valve area 0.76).  Marland Kitchen PAD (peripheral artery disease) ([place])    a. 03/2019 PV Angio: RCFA 80, RSFA 100.  . Rib fracture   . Sinus bradycardia   . Subclavian arterial stenosis ([place])    a. 03/2019 PV Angio: RSCA 90 after origin of CCA. RCCA 30ost, LSCA 80 (PTA and covered stenting).    Past Surgical History:  Procedure Laterality Date  . AORTIC ARCH ANGIOGRAPHY N/A 03/31/2019   Procedure: AORTIC ARCH ANGIOGRAPHY;  Surgeon: Wellington Hampshire, MD;  Location: [place] [facility];  Service: Cardiovascular;  Laterality: N/A;  . CARDIAC CATHETERIZATION    .  ESOPHAGOGASTRODUODENOSCOPY (EGD) WITH PROPOFOL N/A 05/27/2019   Procedure: ESOPHAGOGASTRODUODENOSCOPY (EGD) WITH PROPOFOL;  Surgeon: Toney ReilVanga, Rohini Reddy, MD;  [facility];  Service: Gastroenterology;  Laterality: N/A;  . PERIPHERAL VASCULAR INTERVENTION Left 03/31/2019   Procedure: PERIPHERAL VASCULAR INTERVENTION;  Surgeon: Iran OuchArida, Muhammad A, MD;  [facility];  Service: Cardiovascular;  Laterality: Left;  . RIGHT/LEFT HEART CATH AND CORONARY ANGIOGRAPHY N/A 03/15/2019   Procedure: RIGHT/LEFT HEART CATH AND CORONARY ANGIOGRAPHY;   Surgeon: Iran OuchArida, Muhammad A, MD;  Location: [facility] INVASIVE CV LAB;  Service: Cardiovascular;  Laterality: N/A;  . VISCERAL ANGIOGRAPHY N/A 05/28/2019   Procedure: VISCERAL ANGIOGRAPHY;  Surgeon: Annice Needyew, Jason S, MD;  Location: [facility] INVASIVE CV LAB;  Service: Cardiovascular;  Laterality: N/A;    Current Outpatient Medications:  .  acetaminophen (TYLENOL) 500 MG tablet, Take 1,000 mg by mouth every 6 (six) hours as needed (headache.)., Disp: , Rfl:  .  atorvastatin (LIPITOR) 80 MG tablet, Take 80 mg by mouth at bedtime. , Disp: , Rfl:  .  clopidogrel (PLAVIX) 75 MG tablet, Take 1 tablet (75 mg total) by mouth daily., Disp: 30 tablet, Rfl: 2 .  cyanocobalamin 100 MCG tablet, Take 100 mcg by mouth daily., Disp: , Rfl:  .  docusate sodium (COLACE) 100 MG capsule, Take 100 mg by mouth daily. , Disp: , Rfl:  .  empagliflozin (JARDIANCE) 10 MG TABS tablet, Take 10 mg by mouth daily., Disp: , Rfl:  .  ferrous sulfate 325 (65 FE) MG EC tablet, Take 1 tablet (325 mg total) by mouth 2 (two) times daily., Disp: 60 tablet, Rfl: 3 .  losartan (COZAAR) 25 MG tablet, Take 0.5 tablets (12.5 mg total) by mouth daily., Disp: 45 tablet, Rfl: 3 .  Melatonin 10 MG TABS, Take 10 mg by mouth at bedtime. , Disp: , Rfl:  .  Multiple Vitamin (MULTIVITAMIN WITH MINERALS) TABS tablet, Take 1 tablet by mouth daily. Multivitamin for Senior 50+, Disp: , Rfl:  .  Omega-3 Fatty Acids (FISH OIL) 1000 MG CAPS, Take 1,000 mg by mouth daily., Disp: , Rfl:  .  omeprazole (PRILOSEC) 40 MG capsule, Take 1 capsule (40 mg total) by mouth 2 (two) times a day., Disp: 60 capsule, Rfl: 0 .  traMADol (ULTRAM) 50 MG tablet, Take 1 tablet (50 mg total) by mouth every 6 (six) hours as needed. (Patient not taking: Reported on 06/22/2019), Disp: 8 tablet, Rfl: 0   Family History  Problem Relation Age of Onset  . Congestive Heart Failure Father        had AICD placed.   . Dementia Father   . Dementia Mother      Social History   Tobacco Use  .  Smoking status: Former Smoker    Packs/day: 1.50    Years: 57.00    Pack years: 85.50    Types: Cigarettes  . Smokeless tobacco: Never Used  Substance Use Topics  . Alcohol use: No    Frequency: Never  . Drug use: Never    Allergies as of 06/22/2019  . (No Known Allergies)    Review of Systems:    All systems reviewed and negative except where noted in HPI.   Physical Exam:  BP (!) 108/50 (BP Location: Left Arm, Patient Position: Sitting, Cuff Size: Normal)   Pulse 75   Temp (!) 97.5 F (36.4 C)   Resp 17   Ht 5\' 9"  (1.753 m)   Wt 138 lb 3.2 oz (62.7 kg)   BMI  20.41 kg/m  No LMP for male patient.  General:   Alert, thin built, moderately nourished for his age, pleasant and cooperative in NAD Head:  Normocephalic and atraumatic. Eyes:  Sclera clear, no icterus.   Conjunctiva pink. Ears:  Normal auditory acuity. Nose:  No deformity, discharge, or lesions. Mouth:  No deformity or lesions,oropharynx pink & moist. Neck:  Supple; no masses or thyromegaly. Lungs:  Respirations even and unlabored.  Clear throughout to auscultation.   No wheezes, crackles, or rhonchi. No acute distress. Heart:  Regular rate and rhythm; no murmurs, clicks, rubs, or gallops. Abdomen:  Normal bowel sounds. Soft, non-tender and non-distended without masses, hepatosplenomegaly or hernias noted.  No guarding or rebound tenderness.   Rectal: Not performed Msk:  Symmetrical without gross deformities. Good, equal movement & strength bilaterally. Pulses:  Normal pulses noted. Extremities:  No clubbing or edema.  No cyanosis. Neurologic:  Alert and oriented x3;  grossly normal neurologically. Skin:  Intact without significant lesions or rashes. No jaundice. Psych:  Alert and cooperative. Normal mood and affect.  Imaging Studies: Reviewed  Assessment and Plan:   Kevin Case is a 78 y.o. 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  Currently doing well, asymptomatic, he is tolerating diet well  Continue omeprazole 40 mg twice daily Continue Plavix 75 mg daily Check CBC, iron studies, B12 and folate panel Encouraged him to incorporate more protein in his diet I will discuss about repeat EGD when I see him in at 3 months follow-up   Follow up in 3 months   Arlyss Repress, MD

## 2019-06-23 LAB — CBC
Hematocrit: 37.9 % (ref 37.5–51.0)
Hemoglobin: 12.1 g/dL — ABNORMAL LOW (ref 13.0–17.7)
MCH: 29.2 pg (ref 26.6–33.0)
MCHC: 31.9 g/dL (ref 31.5–35.7)
MCV: 92 fL (ref 79–97)
Platelets: 185 10*3/uL (ref 150–450)
RBC: 4.14 x10E6/uL (ref 4.14–5.80)
RDW: 21.6 % — ABNORMAL HIGH (ref 11.6–15.4)
WBC: 7.5 10*3/uL (ref 3.4–10.8)

## 2019-06-23 LAB — IRON AND TIBC
Iron Saturation: 23 % (ref 15–55)
Iron: 62 ug/dL (ref 38–169)
Total Iron Binding Capacity: 275 ug/dL (ref 250–450)
UIBC: 213 ug/dL (ref 111–343)

## 2019-06-23 LAB — FERRITIN: Ferritin: 105 ng/mL (ref 30–400)

## 2019-06-23 LAB — B12 AND FOLATE PANEL
Folate: 17.6 ng/mL (ref >3.0)
Vitamin B-12: 1270 pg/mL — ABNORMAL HIGH (ref 232–1245)

## 2019-06-25 ENCOUNTER — Encounter (INDEPENDENT_AMBULATORY_CARE_PROVIDER_SITE_OTHER): Payer: Self-pay

## 2019-07-02 ENCOUNTER — Other Ambulatory Visit: Payer: Self-pay | Admitting: Physician Assistant

## 2019-07-02 ENCOUNTER — Encounter: Payer: Self-pay | Admitting: Physician Assistant

## 2019-07-02 DIAGNOSIS — K254 Chronic or unspecified gastric ulcer with hemorrhage: Secondary | ICD-10-CM

## 2019-07-02 DIAGNOSIS — R12 Heartburn: Secondary | ICD-10-CM

## 2019-07-02 DIAGNOSIS — K5289 Other specified noninfective gastroenteritis and colitis: Secondary | ICD-10-CM

## 2019-07-02 MED ORDER — DEXILANT 30 MG PO CPDR: 30.0000 mg | DELAYED_RELEASE_CAPSULE | Freq: Two times a day (BID) | ORAL | 0 refills | Status: DC

## 2019-07-06 MED ORDER — PANTOPRAZOLE SODIUM 40 MG PO TBEC: 40.0000 mg | DELAYED_RELEASE_TABLET | Freq: Two times a day (BID) | ORAL | 0 refills | Status: DC

## 2019-07-06 MED ORDER — PANTOPRAZOLE SODIUM 40 MG PO TBEC: 40.0000 mg | DELAYED_RELEASE_TABLET | Freq: Every day | ORAL | 0 refills | Status: DC

## 2019-07-06 NOTE — Addendum Note (Signed)
Addended by: Trinna Post on: 07/06/2019 01:24 PM   Modules accepted: Orders

## 2019-07-13 ENCOUNTER — Ambulatory Visit: Payer: Medicare Other | Admitting: Gastroenterology

## 2019-07-15 ENCOUNTER — Ambulatory Visit (INDEPENDENT_AMBULATORY_CARE_PROVIDER_SITE_OTHER): Payer: Medicare Other | Admitting: Cardiovascular Disease

## 2019-07-15 ENCOUNTER — Encounter: Payer: Self-pay | Admitting: Cardiovascular Disease

## 2019-07-15 ENCOUNTER — Other Ambulatory Visit: Payer: Self-pay

## 2019-07-15 VITALS — BP 80/60 | HR 50 | Ht 69.0 in | Wt 147.5 lb

## 2019-07-15 DIAGNOSIS — I359 Nonrheumatic aortic valve disorder, unspecified: Secondary | ICD-10-CM | POA: Diagnosis not present

## 2019-07-15 DIAGNOSIS — I2510 Atherosclerotic heart disease of native coronary artery without angina pectoris: Secondary | ICD-10-CM

## 2019-07-15 DIAGNOSIS — I10 Essential (primary) hypertension: Secondary | ICD-10-CM

## 2019-07-15 DIAGNOSIS — I5022 Chronic systolic (congestive) heart failure: Secondary | ICD-10-CM | POA: Diagnosis not present

## 2019-07-15 DIAGNOSIS — E785 Hyperlipidemia, unspecified: Secondary | ICD-10-CM

## 2019-07-15 NOTE — Progress Notes (Signed)
Cardiology Office Note   Date:  07/15/2019   ID:  Kevin Spruceldred Commins, DOB 12/16/1940, MRN [id number]  PCP:  Trey SailorsPollak, Adriana M, PA-C  Cardiologist:   Lorine BearsMuhammad Javeon Macmurray, MD   Chief Complaint  Patient presents with  . other    1 month f/u no complaints today. Meds reviewed verbally with pt.      History of Present Illness: Kevin Case is a 78 y.o. male who presents for a follow-up visit regarding chronic systolic heart failure, coronary artery disease, aortic stenosis and extensive peripheral arterial disease  He has history of sinus bradycardia, left bundle branch block, essential hypertension and hyperlipidemia. [facility] with non-ST elevation myocardial infarction and acute systolic heart failure.  An echocardiogram was performed which showed an EF of 30 to 35%, mild to moderate aortic stenosis and mildly dilated left atrium.  The patient was noted to have very diminished pulses in his upper extremities.  Troponin was mildly elevated and peaked at 3.5. He underwent a right and left cardiac catheterization which showed left dominant coronary arteries with heavily calcified vessels and 80% stenosis in the mid left circumflex.  Right heart catheterization showed normal filling pressures, normal pulmonary pressure and normal cardiac output.  Aortic stenosis was mild with a peak gradient of 10 mmHg.  He was found to have significant heavily calcified proximal left subclavian artery stenosis and heavy calcification in the innominate artery.  There was 60 mm systolic gradient between the right arm and central aortic pressure and 40 mm of systolic gradient between left arm and central pressure.  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 He underwent stenting of the left subclavian artery in May.  The procedure was  complicated by lower GI bleed in the setting of anticoagulation. He was subsequently [facility] July with recurrent upper GI bleed associated with hematemesis and complicated by aspiration pneumonia.  Subsequent EGD was suggestive of ischemic gastritis.  The patient was readmitted and had further bleeding requiring vasopressor support.  He underwent visceral angiography which showed occluded SMA and celiac artery stenosis.  He underwent stenting of the SMA. He has done well since then with no further bleeding.  He denies any chest pain, shortness of breath or arm claudication.  He is also known to have lower extremity peripheral arterial disease although he denies leg pain at the present time.  He feels better overall.  He is gaining weight again which is a good sign. He is concerned about intermittent bulging in the right groin and he might have inguinal hernia there.  However by my exam today I did not appreciate an hernia even with coughing.   Past Medical History:  Diagnosis Date  . Anemia   . Blood transfusion without reported diagnosis   . CAD (coronary artery disease)    a. 03/2019 Cath: LM nl, LAD 50p CA2+, 2732m, D1 50ost, LCX 20p, 7447m, RCA small, mild diff dzs-->Med rx. Consider PCI LCX for refractory angina.  . Carotid arterial disease (HCC)    a. 03/2019 Carotid U/S: RICA 1-39%, RECA <50%, LICA 1-39%, LECA <50%.  . CHF (congestive heart failure) (HCC)   . Clotting disorder (HCC)   . Diabetes mellitus without complication (HCC)   . GIB (gastrointestinal bleeding)    a. 03/2019 BRBPR following PV  procedure req anticoagulation-->2u PRBCs.  . Heart murmur   . HFrEF (heart failure with reduced ejection fraction) (HCC)    a. 03/2019 Echo: EF 30-35%, mild conc LVH. Mildly dil LA. Mod MV annular dil. Mod AS (mean grad , Valve area 0.76).  Marland Kitchen History of tobacco abuse   . Hypertension   . Ischemic cardiomyopathy    a. 03/2019 Echo: EF 30-35%.  . Left bundle branch block   .  Moderate aortic stenosis    a. 03/2019 Echo:  Mod AS (mean grad , Valve area 0.76).  Marland Kitchen PAD (peripheral artery disease) (HCC)    a. 03/2019 PV Angio: RCFA 80, RSFA 100.  . Rib fracture   . Sinus bradycardia   . Subclavian arterial stenosis (HCC)    a. 03/2019 PV Angio: RSCA 90 after origin of CCA. RCCA 30ost, LSCA 80 (PTA and covered stenting).    Past Surgical History:  Procedure Laterality Date  . AORTIC ARCH ANGIOGRAPHY N/A 03/31/2019   Procedure: AORTIC ARCH ANGIOGRAPHY;  Surgeon: Iran Ouch, MD;  [facility];  Service: Cardiovascular;  Laterality: N/A;  . CARDIAC CATHETERIZATION    . ESOPHAGOGASTRODUODENOSCOPY (EGD) WITH PROPOFOL N/A 05/27/2019   Procedure: ESOPHAGOGASTRODUODENOSCOPY (EGD) WITH PROPOFOL;  Surgeon: Toney Reil, MD;  Location[facility] ENDOSCOPY;  Service: Gastroenterology;  Laterality: N/A;  . PERIPHERAL VASCULAR INTERVENTION Left 03/31/2019   Procedure: PERIPHERAL VASCULAR INTERVENTION;  Surgeon: Iran Ouch, MD;  [facility];  Service: Cardiovascular;  Laterality: Left;  . RIGHT/LEFT HEART CATH AND CORONARY ANGIOGRAPHY N/A 03/15/2019   Procedure: RIGHT/LEFT HEART CATH AND CORONARY ANGIOGRAPHY;  Surgeon: Iran Ouch, MD;  Location: [facility] INVASIVE CV LAB;  Service: Cardiovascular;  Laterality: N/A;  . VISCERAL ANGIOGRAPHY N/A 05/28/2019   Procedure: VISCERAL ANGIOGRAPHY;  Surgeon: Annice Needy, MD;  Location: [facility] INVASIVE CV LAB;  Service: Cardiovascular;  Laterality: N/A;     Current Outpatient Medications  Medication Sig Dispense Refill  . acetaminophen (TYLENOL) 500 MG tablet Take 1,000 mg by mouth every 6 (six) hours as needed (headache.).    Marland Kitchen atorvastatin (LIPITOR) 80 MG tablet Take 80 mg by mouth at bedtime.     . clopidogrel (PLAVIX) 75 MG tablet Take 1 tablet (75 mg total) by mouth daily. 30 tablet 2  . cyanocobalamin 100 MCG tablet Take 100 mcg by mouth daily.    Marland Kitchen docusate sodium (COLACE) 100 MG  capsule Take 100 mg by mouth daily.     Marland Kitchen losartan (COZAAR) 25 MG tablet Take 0.5 tablets (12.5 mg total) by mouth daily. 45 tablet 3  . Melatonin 10 MG TABS Take 10 mg by mouth at bedtime.     . Multiple Vitamin (MULTIVITAMIN WITH MINERALS) TABS tablet Take 1 tablet by mouth daily. Multivitamin for Senior 50+    . Omega-3 Fatty Acids (FISH OIL) 1000 MG CAPS Take 1,000 mg by mouth daily.    Marland Kitchen omeprazole (PRILOSEC) 40 MG capsule Take 40 mg by mouth as needed.     . pantoprazole (PROTONIX) 40 MG tablet Take 1 tablet (40 mg total) by mouth 2 (two) times daily. 180 tablet 0  . ferrous sulfate 325 (65 FE) MG EC tablet Take 1 tablet (325 mg total) by mouth 2 (two) times daily. 60 tablet 3   No current facility-administered medications for this visit.     Allergies:   Patient has no known allergies.    Social History:  The patient  reports that he has quit  smoking. His smoking use included cigarettes. He has a 85.50 pack-year smoking history. He has never used smokeless tobacco. He reports that he does not drink alcohol or use drugs.   Family History:  The patient's family history includes Congestive Heart Failure in his father; Dementia in his father and mother.    ROS:  Please see the history of present illness.   Otherwise, review of systems are positive for none.   All other systems are reviewed and negative.    PHYSICAL EXAM: VS:  BP (!) 80/60 (BP Location: Right Arm, Patient Position: Sitting, Cuff Size: Normal)   Pulse (!) 50   Ht 5\' 9"  (1.753 m)   Wt 147 lb 8 oz (66.9 kg)   SpO2 99%   BMI 21.78 kg/m  , BMI Body mass index is 21.78 kg/m. GEN: Well nourished, well developed, in no acute distress  HEENT: normal  Neck: no JVD, bilateral carotid bruits, or masses.  Bilateral bruits in the subclavian area Cardiac: RRR; no  rubs, or gallops,no edema .  2 out of 6 systolic murmur in the aortic area Respiratory:  clear to auscultation bilaterally, normal work of breathing GI: soft,  nontender, nondistended, + BS MS: no deformity or atrophy  Skin: warm and dry, no rash Neuro:  Strength and sensation are intact Psych: euthymic mood, full affect.  Right groin: Normal femoral pulse with no hematoma   EKG:  EKG is ordered today. The ekg ordered today demonstrates sinus bradycardia with left bundle branch block.  Heart rate is 50 bpm.   Recent Labs: 03/12/2019: B Natriuretic Peptide 864.0 05/27/2019: TSH 6.226 05/31/2019: Magnesium 2.4 06/10/2019: ALT 45 06/17/2019: BUN 28; Creatinine, Ser 1.12; Potassium 4.0; Sodium 138 06/22/2019: Hemoglobin 12.1; Platelets 185    Lipid Panel    Component Value Date/Time   CHOL 108 06/10/2019 1130   TRIG 124 06/10/2019 1130   HDL 33 (L) 06/10/2019 1130   CHOLHDL 3.3 06/10/2019 1130   LDLCALC 50 06/10/2019 1130   LDLDIRECT 50 06/10/2019 1127      Wt Readings from Last 3 Encounters:  07/15/19 147 lb 8 oz (66.9 kg)  06/22/19 138 lb 3.2 oz (62.7 kg)  06/18/19 135 lb (61.2 kg)       PAD Screen 03/25/2019  Previous PAD dx? No  Previous surgical procedure? No  Pain with walking? Yes  Subsides with rest? Yes  Feet/toe relief with dangling? No  Painful, non-healing ulcers? No  Extremities discolored? No      ASSESSMENT AND PLAN:  1.  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  Degree of cardiomyopathy is out of proportion to coronary artery disease.  He is currently on low-dose losartan.  Not able to increase due to relatively low blood pressure.  He is not on a beta-blocker due to chronic bradycardia.  He appears to be euvolemic without diuretics.  The patient might benefit from CRT if ejection fraction remains low.  However, I am concerned about his multiple comorbidities and he will have to show up.  If clinical stability before considering this.  2.  Coronary artery disease involving native coronary arteries without angina:  Cardiac catheterization showed 80% stenosis in  the mid left circumflex which was heavily calcified. Currently has no convincing symptoms of angina.  Left circumflex PCI will be reserved for anginal symptoms and if it needs to be done, it will require atherectomy.  He is currently tolerating Plavix.  3.  Innominate  artery stenosis and left subclavian artery stenosis: Status post left subclavian artery stent placement.  His blood pressure should always be checked in the left arm which is more accurate.  His blood pressure on the left arm today was 120/60.  4.  Moderate aortic stenosis: We will plan on repeat echocardiogram in about 3 to 6 months to reevaluate his EF and also degree of aortic stenosis.  5.  Hyperlipidemia: Continue high-dose atorvastatin with a target LDL of less than 70.  Recent lipid profile showed an LDL of 50.  6.  Peripheral arterial disease: He has significant disease especially on the right side although he denies claudication present time.  Continue to monitor.   Disposition:   FU with me in 3 month  Signed,  Lorine BearsMuhammad Tonyetta Berko, MD  07/15/2019 11:21 AM    [facility]

## 2019-07-15 NOTE — Patient Instructions (Signed)
Medication Instructions:  Your physician recommends that you continue on your current medications as directed. Please refer to the Current Medication list given to you today.  If you need a refill on your cardiac medications before your next appointment, please call your pharmacy.   Lab work: None ordered If you have labs (blood work) drawn today and your tests are completely normal, you will receive your results only by: . MyChart Message (if you have MyChart) OR . A paper copy in the mail If you have any lab test that is abnormal or we need to change your treatment, we will call you to review the results.  Testing/Procedures: None ordered  Follow-Up: At [facility], you and your health needs are our priority.  As part of our continuing mission to provide you with exceptional heart care, we have created designated Provider Care Teams.  These Care Teams include your primary Cardiologist (physician) and Advanced Practice Providers (APPs -  Physician Assistants and Nurse Practitioners) who all work together to provide you with the care you need, when you need it. You will need a follow up appointment in 3 months. You may see Muhammad Arida, MD or one of the following Advanced Practice Providers on your designated Care Team:   Christopher Berge, NP Ryan Dunn, PA-C . Jacquelyn Visser, PA-C  Any Other Special Instructions Will Be Listed Below (If Applicable). N/A   

## 2019-07-25 ENCOUNTER — Encounter (INDEPENDENT_AMBULATORY_CARE_PROVIDER_SITE_OTHER): Payer: Self-pay

## 2019-07-27 ENCOUNTER — Encounter (INDEPENDENT_AMBULATORY_CARE_PROVIDER_SITE_OTHER): Payer: Self-pay

## 2019-07-29 ENCOUNTER — Encounter (INDEPENDENT_AMBULATORY_CARE_PROVIDER_SITE_OTHER): Payer: Medicare Other

## 2019-07-29 ENCOUNTER — Encounter (INDEPENDENT_AMBULATORY_CARE_PROVIDER_SITE_OTHER): Payer: Self-pay | Admitting: Nurse Practitioner

## 2019-07-29 ENCOUNTER — Encounter (INDEPENDENT_AMBULATORY_CARE_PROVIDER_SITE_OTHER): Payer: Self-pay

## 2019-07-29 ENCOUNTER — Telehealth (INDEPENDENT_AMBULATORY_CARE_PROVIDER_SITE_OTHER): Payer: Self-pay

## 2019-07-29 ENCOUNTER — Other Ambulatory Visit: Payer: Self-pay

## 2019-07-29 ENCOUNTER — Ambulatory Visit (INDEPENDENT_AMBULATORY_CARE_PROVIDER_SITE_OTHER): Payer: Medicare Other | Admitting: Nurse Practitioner

## 2019-07-29 VITALS — BP 99/65 | HR 50 | Resp 10 | Ht 69.0 in | Wt 153.0 lb

## 2019-07-29 DIAGNOSIS — I70219 Atherosclerosis of native arteries of extremities with intermittent claudication, unspecified extremity: Secondary | ICD-10-CM

## 2019-07-29 DIAGNOSIS — K219 Gastro-esophageal reflux disease without esophagitis: Secondary | ICD-10-CM

## 2019-07-29 DIAGNOSIS — E7800 Pure hypercholesterolemia, unspecified: Secondary | ICD-10-CM | POA: Diagnosis not present

## 2019-07-29 NOTE — Telephone Encounter (Signed)
Patient was seen in office and is now scheduled with Dr. Lucky Cowboy for an angio on 08/04/2019 with a 9:00 am arrival time to the [facility]. Patient will do the Covid test on 07/30/2019 between 12:30-2:30 pm at the [facility]. Pre-procedure instructions were given to the patient in office.

## 2019-07-29 NOTE — Progress Notes (Signed)
SUBJECTIVE:  Patient ID: Kevin Case, male    DOB: [birth year], 78 y.o.   MRN: [id number] Chief Complaint  Patient presents with  . Follow-up    HPI  Kevin Case is a 78 y.o. male The patient returns to the office for followup and review of the noninvasive studies. There has been a significant deterioration in the lower extremity symptoms.  The patient notes interval shortening of their claudication distance and development of mild rest pain symptoms. No new ulcers or wounds have occurred since the last visit.  There have been no significant changes to the patient's overall health care.  The patient denies amaurosis fugax or recent TIA symptoms. There are no recent neurological changes noted. The patient denies history of DVT, PE or superficial thrombophlebitis. The patient denies recent episodes of angina or shortness of breath.   ABI's Rt= 0.41 and Lt=0.51 (No previous ABI) Duplex US of the lower extremity arterial system shows monophasic waveforms of the bilateral tibial arteries with dampened right toe waveforms and severely dampened left toe waveforms.  Previous catheterization done by Dr. Fletcher Anon on 03/31/2019 shows a right common femoral artery occlusion.  Past Medical History:  Diagnosis Date  . Anemia   . Blood transfusion without reported diagnosis   . CAD (coronary artery disease)    a. 03/2019 Cath: LM nl, LAD 50p CA2+, 4m, D1 50ost, LCX 20p, 78m, RCA small, mild diff dzs-->Med rx. Consider PCI LCX for refractory angina.  . Carotid arterial disease ([place])    a. 03/2019 Carotid U/S: RICA 1-66%, RECA <06%, LICA 3-01%, LECA <60%.  . CHF (congestive heart failure) ([place])   . Clotting disorder ([place])   . Diabetes mellitus without complication ([place])   . GIB (gastrointestinal bleeding)    a. 03/2019 BRBPR following PV procedure req anticoagulation-->2u PRBCs.  . Heart murmur   . HFrEF (heart failure with reduced ejection fraction) ([place])    a. 03/2019 Echo: EF 30-35%, mild  conc LVH. Mildly dil LA. Mod MV annular dil. Mod AS (mean grad 78mmHg, Valve area 0.76).  Marland Kitchen History of tobacco abuse   . Hypertension   . Ischemic cardiomyopathy    a. 03/2019 Echo: EF 30-35%.  . Left bundle branch block   . Moderate aortic stenosis    a. 03/2019 Echo:  Mod AS (mean grad 57mmHg, Valve area 0.76).  Marland Kitchen PAD (peripheral artery disease) ([place])    a. 03/2019 PV Angio: RCFA 80, RSFA 100.  . Rib fracture   . Sinus bradycardia   . Subclavian arterial stenosis ([place])    a. 03/2019 PV Angio: RSCA 90 after origin of CCA. RCCA 30ost, LSCA 80 (PTA and covered stenting).    Past Surgical History:  Procedure Laterality Date  . AORTIC ARCH ANGIOGRAPHY N/A 03/31/2019   Procedure: AORTIC ARCH ANGIOGRAPHY;  Surgeon: Wellington Hampshire, MD;  Location: [facility];  Service: Cardiovascular;  Laterality: N/A;  . CARDIAC CATHETERIZATION    . ESOPHAGOGASTRODUODENOSCOPY (EGD) WITH PROPOFOL N/A 05/27/2019   Procedure: ESOPHAGOGASTRODUODENOSCOPY (EGD) WITH PROPOFOL;  Surgeon: Lin Landsman, MD;  Location: [facility];  Service: Gastroenterology;  Laterality: N/A;  . PERIPHERAL VASCULAR INTERVENTION Left 03/31/2019   Procedure: PERIPHERAL VASCULAR INTERVENTION;  Surgeon: Wellington Hampshire, MD;  Location: [facility];  Service: Cardiovascular;  Laterality: Left;  . RIGHT/LEFT HEART CATH AND CORONARY ANGIOGRAPHY N/A 03/15/2019   Procedure: RIGHT/LEFT HEART CATH AND CORONARY ANGIOGRAPHY;  Surgeon: Wellington Hampshire, MD;  Location: [facility];  Service:  Cardiovascular;  Laterality: N/A;  . VISCERAL ANGIOGRAPHY N/A 05/28/2019   Procedure: VISCERAL ANGIOGRAPHY;  Surgeon: Annice Needy, MD;  Location: [facility];  Service: Cardiovascular;  Laterality: N/A;    Social History   Socioeconomic History  . Marital status: Married    Spouse name: Not on file  . Number of children: Not on file  . Years of education: Not on file  . Highest education level: Not on file   Occupational History  . Not on file  Social Needs  . Financial resource strain: Not on file  . Food insecurity    Worry: Not on file    Inability: Not on file  . Transportation needs    Medical: Not on file    Non-medical: Not on file  Tobacco Use  . Smoking status: Former Smoker    Packs/day: 1.50    Years: 57.00    Pack years: 85.50    Types: Cigarettes  . Smokeless tobacco: Never Used  Substance and Sexual Activity  . Alcohol use: No    Frequency: Never  . Drug use: Never  . Sexual activity: Not on file  Lifestyle  . Physical activity    Days per week: Not on file    Minutes per session: Not on file  . Stress: Not on file  Relationships  . Social Musician on phone: Not on file    Gets together: Not on file    Attends religious service: Not on file    Active member of club or organization: Not on file    Attends meetings of clubs or organizations: Not on file    Relationship status: Not on file  . Intimate partner violence    Fear of current or ex partner: Not on file    Emotionally abused: Not on file    Physically abused: Not on file    Forced sexual activity: Not on file  Other Topics Concern  . Not on file  Social History Narrative  . Not on file    Family History  Problem Relation Age of Onset  . Congestive Heart Failure Father        had AICD placed.   . Dementia Father   . Dementia Mother     No Known Allergies   Review of Systems   Review of Systems: Negative Unless Checked Constitutional: [] Weight loss  [] Fever  [] Chills Cardiac: [] Chest pain   []  Atrial Fibrillation  [] Palpitations   [] Shortness of breath when laying flat   [] Shortness of breath with exertion. [] Shortness of breath at rest Vascular:  [] Pain in legs with walking   [] Pain in legs with standing [] Pain in legs when laying flat   [x] Claudication    [] Pain in feet when laying flat    [] History of DVT   [] Phlebitis   [] Swelling in legs   [] Varicose veins   [] Non-healing  ulcers Pulmonary:   [] Uses home oxygen   [] Productive cough   [] Hemoptysis   [] Wheeze  [] COPD   [] Asthma Neurologic:  [] Dizziness   [] Seizures  [] Blackouts [] History of stroke   [] History of TIA  [] Aphasia   [] Temporary Blindness   [] Weakness or numbness in arm   [x] Weakness or numbness in leg Musculoskeletal:   [] Joint swelling   [] Joint pain   [] Low back pain  []  History of Knee Replacement [x] Arthritis [] back Surgeries  []  Spinal Stenosis    Hematologic:  [] Easy bruising  [] Easy bleeding   [] Hypercoagulable state   []   Anemic Gastrointestinal:  [] Diarrhea   [] Vomiting  [x] Gastroesophageal reflux/heartburn   [] Difficulty swallowing. [x] Abdominal pain Genitourinary:  [] Chronic kidney disease   [] Difficult urination  [] Anuric   [] Blood in urine [] Frequent urination  [] Burning with urination   [] Hematuria Skin:  [] Rashes   [] Ulcers [] Wounds Psychological:  [x] History of anxiety   []  History of major depression  []  Memory Difficulties      OBJECTIVE:   Physical Exam  BP 99/65 (BP Location: Right Arm, Patient Position: Sitting, Cuff Size: Normal)   Pulse (!) 50   Resp 10   Ht 5\' 9"  (1.753 m)   Wt 153 lb (69.4 kg)   BMI 22.59 kg/m   Gen: WD/WN, NAD Head: /AT, No temporalis wasting.  Ear/Nose/Throat: Hearing grossly intact, nares w/o erythema or drainage Eyes: PER, EOMI, sclera nonicteric.  Neck: Supple, no masses.  No JVD.  Pulmonary:  Good air movement, no use of accessory muscles.  Cardiac: RRR Vascular:  Vessel Right Left  Radial Palpable Palpable  Dorsalis Pedis Not Palpable Not Palpable  Posterior Tibial Not Palpable Not Palpable   Gastrointestinal: soft, non-distended. No guarding/no peritoneal signs.  Musculoskeletal: M/S 5/5 throughout.  No deformity or atrophy.  Neurologic: Pain and light touch intact in extremities.  Symmetrical.  Speech is fluent. Motor exam as listed above. Psychiatric: Judgment intact, Mood & affect appropriate for pt's clinical situation.  Dermatologic: No Venous rashes. No Ulcers Noted.  No changes consistent with cellulitis. Lymph : No Cervical lymphadenopathy, no lichenification or skin changes of chronic lymphedema.       ASSESSMENT AND PLAN:   1. Atherosclerotic peripheral vascular disease with intermittent claudication (HCC) Recommend:  The patient has experienced increased symptoms and is now describing lifestyle limiting claudication and mild rest pain.   Given the severity of the patient's right lower extremity symptoms the patient should undergo angiography and intervention.  Risk and benefits were reviewed the patient.  Indications for the procedure were reviewed.  All questions were answered, the patient agrees to proceed.   The patient should continue walking and begin a more formal exercise program.  The patient should continue antiplatelet therapy and aggressive treatment of the lipid abnormalities  The patient will follow up with me after the angiogram.   2. Hypercholesterolemia Continue statin as ordered and reviewed, no changes at this time   3. Gastroesophageal reflux disease without esophagitis Continue PPI as already ordered, this medication has been reviewed and there are no changes at this time.  Avoidence of caffeine and alcohol  Moderate elevation of the head of the bed     Current Outpatient Medications on File Prior to Visit  Medication Sig Dispense Refill  . acetaminophen (TYLENOL) 500 MG tablet Take 1,000 mg by mouth every 6 (six) hours as needed (headache.).    Marland Kitchen. atorvastatin (LIPITOR) 80 MG tablet Take 80 mg by mouth at bedtime.     . clopidogrel (PLAVIX) 75 MG tablet Take 1 tablet (75 mg total) by mouth daily. 30 tablet 2  . cyanocobalamin 100 MCG tablet Take 100 mcg by mouth daily.    Marland Kitchen. docusate sodium (COLACE) 100 MG capsule Take 100 mg by mouth daily.     . ferrous sulfate 325 (65 FE) MG EC tablet Take 1 tablet (325 mg total) by mouth 2 (two) times daily. 60 tablet 3  .  JARDIANCE 10 MG TABS tablet Take 10 mg by mouth daily.    Marland Kitchen. losartan (COZAAR) 25 MG tablet Take 0.5 tablets (12.5 mg total) by  mouth daily. 45 tablet 3  . Melatonin 10 MG TABS Take 10 mg by mouth at bedtime.     . Multiple Vitamin (MULTIVITAMIN WITH MINERALS) TABS tablet Take 1 tablet by mouth daily. Multivitamin for Senior 50+    . Omega-3 Fatty Acids (FISH OIL) 1000 MG CAPS Take 1,000 mg by mouth daily.    Marland Kitchen omeprazole (PRILOSEC) 40 MG capsule Take 40 mg by mouth as needed.      No current facility-administered medications on file prior to visit.     There are no Patient Instructions on file for this visit. No follow-ups on file.   Georgiana Spinner, NP  This note was completed with Office manager.  Any errors are purely unintentional.

## 2019-07-30 ENCOUNTER — Other Ambulatory Visit
Admission: RE | Admit: 2019-07-30 | Discharge: 2019-07-30 | Disposition: A | Discharge status: Home / Self Care | Payer: Medicare Other | Source: Ambulatory Visit | Attending: Vascular Surgery | Admitting: Vascular Surgery

## 2019-07-30 DIAGNOSIS — Z20828 Contact with and (suspected) exposure to other viral communicable diseases: Secondary | ICD-10-CM | POA: Insufficient documentation

## 2019-07-31 LAB — SARS CORONAVIRUS 2 (TAT 6-24 HRS): SARS Coronavirus 2: NEGATIVE

## 2019-08-01 NOTE — Progress Notes (Signed)
Patient ID: Kevin Case, male    DOB: [birth year], 78 y.o.   MRN: [id number]  HPI  Kevin Case is a 78 y/o male with a history of CAD, DM, HTN, anemia, PAD, GIB, former tobacco use and chronic heart failure.   Echo report from 03/13/2019 reviewed and showed an EF of 30-35% along with moderate AS.   Catheterization done 03/15/2019 reviewed and showed:  Prox Cx lesion is 20% stenosed.  Mid Cx lesion is 80% stenosed.  Prox LAD lesion is 50% stenosed.  Mid LAD lesion is 30% stenosed.  Ost 1st Diag lesion is 50% stenosed.   1.  Left dominant coronary arteries with significant mid left circumflex stenosis.  The coronary arteries are overall heavily calcified. 2.  Right heart catheterization showed normal filling pressures, normal pulmonary pressure and normal cardiac output. 3.  Mild aortic stenosis with peak to peak gradient of only 10 mmHg. 4.  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  systolic gradient between right arm noninvasive pressure and central aortic pressure.  of systolic gradient between left arm noninvasive pressure and central aortic pressure.  Admitted 05/27/2019 due to EGD showing several necrotic gastric ulcers. Palliative care, vascular and surgical consults obtained. Needed IV fluids due to hypovolemia/ hypotension. Discharged after 6 days.   He presents today for a follow-up visit with a chief complaint of minimal fatigue upon moderate exertion. He describes this as chronic in nature having been present for several years. He has associated pedal edema and intermittent back pain along with this. He denies any difficulty sleeping, abdominal distention, palpitations, chest pain, shortness of breath, dizziness or weight gain.   Past Medical History:  Diagnosis Date  . Anemia   . Blood transfusion without reported diagnosis   . CAD (coronary artery disease)    a. 03/2019 Cath:  LM nl, LAD 50p CA2+, 33m, D1 50ost, LCX 20p, 58m, RCA small, mild diff dzs-->Med rx. Consider PCI LCX for refractory angina.  . Carotid arterial disease (HCC)    a. 03/2019 Carotid U/S: RICA 1-39%, RECA <50%, LICA 1-39%, LECA <50%.  . CHF (congestive heart failure) (HCC)   . Clotting disorder (HCC)   . Diabetes mellitus without complication (HCC)   . GIB (gastrointestinal bleeding)    a. 03/2019 BRBPR following PV procedure req anticoagulation-->2u PRBCs.  . Heart murmur   . HFrEF (heart failure with reduced ejection fraction) (HCC)    a. 03/2019 Echo: EF 30-35%, mild conc LVH. Mildly dil LA. Mod MV annular dil. Mod AS (mean grad , Valve area 0.76).  Marland Kitchen History of tobacco abuse   . Hypertension   . Ischemic cardiomyopathy    a. 03/2019 Echo: EF 30-35%.  . Left bundle branch block   . Moderate aortic stenosis    a. 03/2019 Echo:  Mod AS (mean grad , Valve area 0.76).  Marland Kitchen PAD (peripheral artery disease) (HCC)    a. 03/2019 PV Angio: RCFA 80, RSFA 100.  . Rib fracture   . Sinus bradycardia   . Subclavian arterial stenosis (HCC)    a. 03/2019 PV Angio: RSCA 90 after origin of CCA. RCCA 30ost, LSCA 80 (PTA and covered stenting).   Past Surgical History:  Procedure Laterality Date  . AORTIC ARCH ANGIOGRAPHY N/A 03/31/2019   Procedure: AORTIC ARCH ANGIOGRAPHY;  Surgeon: Iran Ouch, MD;  Location: [facility];  Service: Cardiovascular;  Laterality: N/A;  . CARDIAC CATHETERIZATION    .  ESOPHAGOGASTRODUODENOSCOPY (EGD) WITH PROPOFOL N/A 05/27/2019   Procedure: ESOPHAGOGASTRODUODENOSCOPY (EGD) WITH PROPOFOL;  Surgeon: Lin Landsman, MD;  Location: [facility];  Service: Gastroenterology;  Laterality: N/A;  . PERIPHERAL VASCULAR INTERVENTION Left 03/31/2019   Procedure: PERIPHERAL VASCULAR INTERVENTION;  Surgeon: Wellington Hampshire, MD;  Location: [facility];  Service: Cardiovascular;  Laterality: Left;  . RIGHT/LEFT HEART CATH AND CORONARY ANGIOGRAPHY N/A  03/15/2019   Procedure: RIGHT/LEFT HEART CATH AND CORONARY ANGIOGRAPHY;  Surgeon: Wellington Hampshire, MD;  Location: [facility];  Service: Cardiovascular;  Laterality: N/A;  . VISCERAL ANGIOGRAPHY N/A 05/28/2019   Procedure: VISCERAL ANGIOGRAPHY;  Surgeon: Algernon Huxley, MD;  Location: [facility];  Service: Cardiovascular;  Laterality: N/A;   Family History  Problem Relation Age of Onset  . Congestive Heart Failure Father        had AICD placed.   . Dementia Father   . Dementia Mother    Social History   Tobacco Use  . Smoking status: Former Smoker    Packs/day: 1.50    Years: 57.00    Pack years: 85.50    Types: Cigarettes  . Smokeless tobacco: Never Used  Substance Use Topics  . Alcohol use: No    Frequency: Never   No Known Allergies Prior to Admission medications   Medication Sig Start Date End Date Taking? Authorizing Provider  acetaminophen (TYLENOL) 500 MG tablet Take 1,000 mg by mouth every 6 (six) hours as needed (headache.).   Yes [provider]  atorvastatin (LIPITOR) 80 MG tablet Take 80 mg by mouth at bedtime.  01/05/19  Yes [provider]  clopidogrel (PLAVIX) 75 MG tablet Take 1 tablet (75 mg total) by mouth daily. 06/03/19  Yes Gladstone Lighter, MD  cyanocobalamin 100 MCG tablet Take 100 mcg by mouth daily.   Yes [provider]  docusate sodium (COLACE) 100 MG capsule Take 100 mg by mouth daily.    Yes [provider]  ferrous sulfate 325 (65 FE) MG EC tablet Take 1 tablet (325 mg total) by mouth 2 (two) times daily. 06/22/19 06/21/20 Yes Pollak, Adriana M, PA-C  JARDIANCE 10 MG TABS tablet Take 10 mg by mouth daily. 07/16/19  Yes [provider]  losartan (COZAAR) 25 MG tablet Take 0.5 tablets (12.5 mg total) by mouth daily. 06/10/19 09/08/19 Yes Dunn, Areta Haber, PA-C  Melatonin 10 MG TABS Take 10 mg by mouth at bedtime.    Yes [provider]  Multiple Vitamin (MULTIVITAMIN WITH MINERALS) TABS tablet  Take 1 tablet by mouth daily. Multivitamin for Senior 50+   Yes [provider]  Omega-3 Fatty Acids (FISH OIL) 1000 MG CAPS Take 1,000 mg by mouth daily.   Yes [provider]  omeprazole (PRILOSEC) 40 MG capsule Take 40 mg by mouth as needed.  07/05/19  Yes [provider]    Review of Systems  Constitutional: Positive for fatigue (with moderate exertion). Negative for appetite change.  HENT: Negative for congestion, postnasal drip and sore throat.   Eyes: Negative.   Respiratory: Negative for chest tightness and shortness of breath.   Cardiovascular: Positive for leg swelling (R>L). Negative for chest pain and palpitations.  Gastrointestinal: Negative for abdominal distention and abdominal pain.  Endocrine: Negative.   Genitourinary: Negative.   Musculoskeletal: Positive for back pain (at times). Negative for neck pain.  Skin: Negative.   Allergic/Immunologic: Negative.   Neurological: Negative for dizziness and light-headedness.  Memory loss  Hematological: Negative for adenopathy. Does not bruise/bleed easily.  Psychiatric/Behavioral: Negative for dysphoric mood and sleep disturbance. The patient is not nervous/anxious.     Vitals:   08/03/19 1232  BP: 116/66  Pulse: (!) 56  Resp: 18  SpO2: 100%  Weight: 151 lb 8 oz (68.7 kg)  Height: 5\' 9"  (1.753 m)   Wt Readings from Last 3 Encounters:  08/03/19 151 lb 8 oz (68.7 kg)  07/29/19 153 lb (69.4 kg)  07/15/19 147 lb 8 oz (66.9 kg)   Lab Results  Component Value Date   CREATININE 1.12 06/17/2019   CREATININE 1.07 06/09/2019   CREATININE 1.14 06/01/2019    Physical Exam Vitals signs and nursing note reviewed.  Constitutional:      Appearance: Normal appearance.  HENT:     Head: Normocephalic and atraumatic.  Neck:     Musculoskeletal: Normal range of motion and neck supple.  Cardiovascular:     Rate and Rhythm: Regular rhythm. Bradycardia present.  Pulmonary:     Effort: Pulmonary  effort is normal.     Breath sounds: No wheezing or rales.  Abdominal:     General: There is no distension.     Palpations: Abdomen is soft.  Musculoskeletal:        General: No tenderness.     Right lower leg: Edema (trace pitting) present.     Left lower leg: Edema (trace pitting) present.  Skin:    General: Skin is warm and dry.  Neurological:     General: No focal deficit present.     Mental Status: He is alert and oriented to person, place, and time.  Psychiatric:        Mood and Affect: Mood normal.        Behavior: Behavior normal.    Assessment & Plan:  1.  Chronic heart failure with reduced ejection fraction- - NYHA class II - euvolemic today  - weighing daily and says that his weight has been stable. Instructed to call for an overnight weight gain of >2 pounds or a weekly weight gain of >5 pounds - not adding salt and is trying to follow a low sodium diet - saw cardiology Kirke Corin) 07/15/2019 - BNP 03/12/2019 was 864.0 - wearing compression socks daily - has been bradycardic so unable to use beta-blocker - unsure if BP could tolerate changing losartan to entresto  2: HTN- - BP in right arm was 116/66 and in left arm was 163/64 - does have significant obstructive disease in arterial system in both arms; saw vascular Manson Passey) 07/29/2019 - saw PCP Rosezetta Schlatter) 06/09/2019 - BMP 06/17/2019 reviewed and showed sodium 138, potassium 4.0, creatinine 1.12 and GFR >60  3: DM-   - not checking his glucose at home very often - A1c 05/04/2019 was 6/1%  Medication list was reviewed.   Return in 3 months or sooner for any questions/problems before then.

## 2019-08-03 ENCOUNTER — Ambulatory Visit: Payer: Medicare Other | Attending: Family | Admitting: Family

## 2019-08-03 ENCOUNTER — Other Ambulatory Visit: Payer: Self-pay

## 2019-08-03 ENCOUNTER — Encounter: Payer: Self-pay | Admitting: Family

## 2019-08-03 VITALS — BP 116/66 | HR 56 | Resp 18 | Ht 69.0 in | Wt 151.5 lb

## 2019-08-03 DIAGNOSIS — M549 Dorsalgia, unspecified: Secondary | ICD-10-CM | POA: Insufficient documentation

## 2019-08-03 DIAGNOSIS — Z7902 Long term (current) use of antithrombotics/antiplatelets: Secondary | ICD-10-CM | POA: Diagnosis not present

## 2019-08-03 DIAGNOSIS — I110 Hypertensive heart disease with heart failure: Secondary | ICD-10-CM | POA: Diagnosis present

## 2019-08-03 DIAGNOSIS — I255 Ischemic cardiomyopathy: Secondary | ICD-10-CM | POA: Insufficient documentation

## 2019-08-03 DIAGNOSIS — Z8249 Family history of ischemic heart disease and other diseases of the circulatory system: Secondary | ICD-10-CM | POA: Diagnosis not present

## 2019-08-03 DIAGNOSIS — I5022 Chronic systolic (congestive) heart failure: Secondary | ICD-10-CM

## 2019-08-03 DIAGNOSIS — I2510 Atherosclerotic heart disease of native coronary artery without angina pectoris: Secondary | ICD-10-CM | POA: Insufficient documentation

## 2019-08-03 DIAGNOSIS — E1151 Type 2 diabetes mellitus with diabetic peripheral angiopathy without gangrene: Secondary | ICD-10-CM | POA: Diagnosis not present

## 2019-08-03 DIAGNOSIS — Z87891 Personal history of nicotine dependence: Secondary | ICD-10-CM | POA: Insufficient documentation

## 2019-08-03 DIAGNOSIS — I350 Nonrheumatic aortic (valve) stenosis: Secondary | ICD-10-CM | POA: Insufficient documentation

## 2019-08-03 DIAGNOSIS — R600 Localized edema: Secondary | ICD-10-CM | POA: Insufficient documentation

## 2019-08-03 DIAGNOSIS — Z79899 Other long term (current) drug therapy: Secondary | ICD-10-CM | POA: Insufficient documentation

## 2019-08-03 DIAGNOSIS — I10 Essential (primary) hypertension: Secondary | ICD-10-CM

## 2019-08-03 DIAGNOSIS — E119 Type 2 diabetes mellitus without complications: Secondary | ICD-10-CM

## 2019-08-03 NOTE — Patient Instructions (Signed)
Continue weighing daily and call for an overnight weight gain of > 2 pounds or a weekly weight gain of >5 pounds. 

## 2019-08-04 ENCOUNTER — Ambulatory Visit
Admission: RE | Admit: 2019-08-04 | Discharge: 2019-08-04 | Disposition: A | Discharge status: Home / Self Care | Payer: Medicare Other | Attending: Vascular Surgery | Admitting: Vascular Surgery

## 2019-08-04 ENCOUNTER — Encounter
Admission: RE | Disposition: A | Discharge status: Home / Self Care | Payer: Self-pay | Source: Home / Self Care | Attending: Vascular Surgery

## 2019-08-04 ENCOUNTER — Other Ambulatory Visit: Payer: Self-pay

## 2019-08-04 ENCOUNTER — Other Ambulatory Visit (INDEPENDENT_AMBULATORY_CARE_PROVIDER_SITE_OTHER): Payer: Self-pay | Admitting: Nurse Practitioner

## 2019-08-04 DIAGNOSIS — E119 Type 2 diabetes mellitus without complications: Secondary | ICD-10-CM | POA: Diagnosis not present

## 2019-08-04 DIAGNOSIS — Z79899 Other long term (current) drug therapy: Secondary | ICD-10-CM | POA: Diagnosis not present

## 2019-08-04 DIAGNOSIS — E7800 Pure hypercholesterolemia, unspecified: Secondary | ICD-10-CM | POA: Insufficient documentation

## 2019-08-04 DIAGNOSIS — D649 Anemia, unspecified: Secondary | ICD-10-CM | POA: Diagnosis not present

## 2019-08-04 DIAGNOSIS — Z7902 Long term (current) use of antithrombotics/antiplatelets: Secondary | ICD-10-CM | POA: Insufficient documentation

## 2019-08-04 DIAGNOSIS — Z87891 Personal history of nicotine dependence: Secondary | ICD-10-CM | POA: Insufficient documentation

## 2019-08-04 DIAGNOSIS — I2510 Atherosclerotic heart disease of native coronary artery without angina pectoris: Secondary | ICD-10-CM | POA: Diagnosis not present

## 2019-08-04 DIAGNOSIS — I70219 Atherosclerosis of native arteries of extremities with intermittent claudication, unspecified extremity: Secondary | ICD-10-CM

## 2019-08-04 DIAGNOSIS — K219 Gastro-esophageal reflux disease without esophagitis: Secondary | ICD-10-CM | POA: Insufficient documentation

## 2019-08-04 DIAGNOSIS — I70211 Atherosclerosis of native arteries of extremities with intermittent claudication, right leg: Secondary | ICD-10-CM | POA: Insufficient documentation

## 2019-08-04 DIAGNOSIS — I509 Heart failure, unspecified: Secondary | ICD-10-CM | POA: Insufficient documentation

## 2019-08-04 DIAGNOSIS — I70223 Atherosclerosis of native arteries of extremities with rest pain, bilateral legs: Secondary | ICD-10-CM | POA: Diagnosis not present

## 2019-08-04 DIAGNOSIS — I110 Hypertensive heart disease with heart failure: Secondary | ICD-10-CM | POA: Insufficient documentation

## 2019-08-04 DIAGNOSIS — I255 Ischemic cardiomyopathy: Secondary | ICD-10-CM | POA: Diagnosis not present

## 2019-08-04 DIAGNOSIS — I350 Nonrheumatic aortic (valve) stenosis: Secondary | ICD-10-CM | POA: Insufficient documentation

## 2019-08-04 HISTORY — PX: LOWER EXTREMITY ANGIOGRAPHY: CATH118251

## 2019-08-04 LAB — GLUCOSE, CAPILLARY
Glucose-Capillary: 122 mg/dL — ABNORMAL HIGH (ref 70–99)
Glucose-Capillary: 106 mg/dL — ABNORMAL HIGH (ref 70–99)

## 2019-08-04 LAB — BUN: BUN: 29 mg/dL — ABNORMAL HIGH (ref 8–23)

## 2019-08-04 LAB — CREATININE, SERUM
Creatinine, Ser: 1.07 mg/dL (ref 0.61–1.24)
GFR calc Af Amer: >60 mL/min (ref >60)
GFR calc non Af Amer: >60 mL/min (ref >60)

## 2019-08-04 SURGERY — LOWER EXTREMITY ANGIOGRAPHY
Anesthesia: Moderate Sedation | Laterality: Right

## 2019-08-04 MED ORDER — ACETAMINOPHEN 325 MG PO TABS: 650.0000 mg | ORAL_TABLET | ORAL | Status: DC | PRN

## 2019-08-04 MED ORDER — SODIUM CHLORIDE 0.9% FLUSH: 3.0000 mL | INTRAVENOUS | Status: DC | PRN

## 2019-08-04 MED ORDER — MIDAZOLAM HCL 5 MG/5ML IJ SOLN
INTRAMUSCULAR | Status: AC
  Filled 2019-08-04: qty 5

## 2019-08-04 MED ORDER — SODIUM CHLORIDE 0.9 % IV SOLN: 250.0000 mL | INTRAVENOUS | Status: DC | PRN

## 2019-08-04 MED ORDER — HYDRALAZINE HCL 20 MG/ML IJ SOLN: 5.0000 mg | INTRAMUSCULAR | Status: DC | PRN

## 2019-08-04 MED ORDER — FENTANYL CITRATE (PF) 100 MCG/2ML IJ SOLN
INTRAMUSCULAR | Status: DC | PRN
  Administered 2019-08-04: 25 ug via INTRAVENOUS
  Administered 2019-08-04: 50 ug via INTRAVENOUS
  Administered 2019-08-04: 25 ug via INTRAVENOUS

## 2019-08-04 MED ORDER — FAMOTIDINE 20 MG PO TABS: 40.0000 mg | ORAL_TABLET | Freq: Once | ORAL | Status: DC | PRN

## 2019-08-04 MED ORDER — SODIUM CHLORIDE 0.9 % IV SOLN
INTRAVENOUS | Status: DC
  Administered 2019-08-04: 10:00:00 via INTRAVENOUS

## 2019-08-04 MED ORDER — ASPIRIN EC 81 MG PO TBEC: 81.0000 mg | DELAYED_RELEASE_TABLET | Freq: Every day | ORAL | 2 refills | Status: AC

## 2019-08-04 MED ORDER — ASPIRIN EC 81 MG PO TBEC
DELAYED_RELEASE_TABLET | ORAL | Status: AC
  Filled 2019-08-04: qty 1

## 2019-08-04 MED ORDER — HEPARIN SODIUM (PORCINE) 1000 UNIT/ML IJ SOLN
INTRAMUSCULAR | Status: DC | PRN
  Administered 2019-08-04: 5000 [IU] via INTRAVENOUS

## 2019-08-04 MED ORDER — LABETALOL HCL 5 MG/ML IV SOLN: 10.0000 mg | INTRAVENOUS | Status: DC | PRN

## 2019-08-04 MED ORDER — METHYLPREDNISOLONE SODIUM SUCC 125 MG IJ SOLR: 125.0000 mg | Freq: Once | INTRAMUSCULAR | Status: DC | PRN

## 2019-08-04 MED ORDER — CEFAZOLIN SODIUM-DEXTROSE 2-4 GM/100ML-% IV SOLN
2.0000 g | Freq: Once | INTRAVENOUS | Status: AC
  Administered 2019-08-04: 10:00:00 2 g via INTRAVENOUS

## 2019-08-04 MED ORDER — CEFAZOLIN SODIUM-DEXTROSE 2-4 GM/100ML-% IV SOLN
INTRAVENOUS | Status: AC
  Filled 2019-08-04: qty 100

## 2019-08-04 MED ORDER — HEPARIN SODIUM (PORCINE) 1000 UNIT/ML IJ SOLN
INTRAMUSCULAR | Status: AC
  Filled 2019-08-04: qty 1

## 2019-08-04 MED ORDER — MIDAZOLAM HCL 2 MG/2ML IJ SOLN
INTRAMUSCULAR | Status: DC | PRN
  Administered 2019-08-04: 2 mg via INTRAVENOUS
  Administered 2019-08-04 (×2): 1 mg via INTRAVENOUS

## 2019-08-04 MED ORDER — HYDRALAZINE HCL 20 MG/ML IJ SOLN
10.0000 mg | Freq: Once | INTRAMUSCULAR | Status: AC
  Administered 2019-08-04 (×2): 10 mg via INTRAVENOUS

## 2019-08-04 MED ORDER — DIPHENHYDRAMINE HCL 50 MG/ML IJ SOLN: 50.0000 mg | Freq: Once | INTRAMUSCULAR | Status: DC | PRN

## 2019-08-04 MED ORDER — HYDROMORPHONE HCL 1 MG/ML IJ SOLN: 1.0000 mg | Freq: Once | INTRAMUSCULAR | Status: DC | PRN

## 2019-08-04 MED ORDER — MIDAZOLAM HCL 2 MG/ML PO SYRP: 8.0000 mg | ORAL_SOLUTION | Freq: Once | ORAL | Status: DC | PRN

## 2019-08-04 MED ORDER — IODIXANOL 320 MG/ML IV SOLN
INTRAVENOUS | Status: DC | PRN
  Administered 2019-08-04: 95 mL via INTRA_ARTERIAL

## 2019-08-04 MED ORDER — ONDANSETRON HCL 4 MG/2ML IJ SOLN: 4.0000 mg | Freq: Four times a day (QID) | INTRAMUSCULAR | Status: DC | PRN

## 2019-08-04 MED ORDER — FENTANYL CITRATE (PF) 100 MCG/2ML IJ SOLN
INTRAMUSCULAR | Status: AC
  Filled 2019-08-04: qty 2

## 2019-08-04 MED ORDER — SODIUM CHLORIDE 0.9% FLUSH: 3.0000 mL | Freq: Two times a day (BID) | INTRAVENOUS | Status: DC

## 2019-08-04 MED ORDER — HYDRALAZINE HCL 20 MG/ML IJ SOLN
INTRAMUSCULAR | Status: AC
  Administered 2019-08-04: 12:00:00 10 mg via INTRAVENOUS
  Filled 2019-08-04: qty 1

## 2019-08-04 MED ORDER — ASPIRIN EC 81 MG PO TBEC
81.0000 mg | DELAYED_RELEASE_TABLET | Freq: Every day | ORAL | Status: DC
  Administered 2019-08-04: 14:00:00 81 mg via ORAL

## 2019-08-04 MED ORDER — SODIUM CHLORIDE 0.9 % IV SOLN: INTRAVENOUS | Status: DC

## 2019-08-04 SURGICAL SUPPLY — 32 items
BALLN DORADO 5X200X135 (BALLOONS) ×3
BALLN DORADO 6X200X135 (BALLOONS) ×3
BALLN LUTONIX 018 5X220X130 (BALLOONS) ×3
BALLN LUTONIX 018 5X300X130 (BALLOONS) ×3
BALLN LUTONIX DCB 7X60X130 (BALLOONS) ×3
BALLN ULTRVRSE 2.5X300X150 (BALLOONS) ×3
BALLN ULTRVRSE 4X300X150 (BALLOONS) ×3
BALLOON DORADO 5X200X135 (BALLOONS) ×1 IMPLANT
BALLOON DORADO 6X200X135 (BALLOONS) ×1 IMPLANT
BALLOON LUTONIX 018 5X220X130 (BALLOONS) ×1 IMPLANT
BALLOON LUTONIX 018 5X300X130 (BALLOONS) ×1 IMPLANT
BALLOON LUTONIX DCB 7X60X130 (BALLOONS) ×1 IMPLANT
BALLOON ULTRVRSE 2.5X300X150 (BALLOONS) ×1 IMPLANT
BALLOON ULTRVRSE 4X300X150 (BALLOONS) ×1 IMPLANT
CANNULA 5F STIFF (CANNULA) ×3 IMPLANT
CATH BEACON 5 .038 100 VERT TP (CATHETERS) ×3 IMPLANT
CATH CXI SUPP ANG 4FR 135 (CATHETERS) ×1 IMPLANT
CATH CXI SUPP ANG 4FR 135CM (CATHETERS) ×3
CATH PIG 70CM (CATHETERS) ×3 IMPLANT
COVER PROBE U/S 5X48 (MISCELLANEOUS) ×3 IMPLANT
DEVICE PRESTO INFLATION (MISCELLANEOUS) ×3 IMPLANT
DEVICE STARCLOSE SE CLOSURE (Vascular Products) ×3 IMPLANT
GLIDEWIRE ADV .035X260CM (WIRE) ×3 IMPLANT
PACK ANGIOGRAPHY (CUSTOM PROCEDURE TRAY) ×3 IMPLANT
SHEATH ANL2 6FRX45 HC (SHEATH) ×3 IMPLANT
SHEATH BRITE TIP 5FRX11 (SHEATH) ×3 IMPLANT
STENT VIABAHN 6X250X120 (Permanent Stent) ×3 IMPLANT
STENT VIABAHN 6X50X120 (Permanent Stent) ×3 IMPLANT
SYR MEDRAD MARK 7 150ML (SYRINGE) ×3 IMPLANT
TUBING CONTRAST HIGH PRESS 72 (TUBING) ×3 IMPLANT
WIRE G V18X300CM (WIRE) ×3 IMPLANT
WIRE J 3MM .035X145CM (WIRE) ×3 IMPLANT

## 2019-08-04 NOTE — H&P (Signed)
[facility] History & Physical Update  The patient was interviewed and re-examined.  The patient's previous History and Physical has been reviewed and is unchanged.  There is no change in the plan of care. We plan to proceed with the scheduled procedure.  Leotis Pain, MD  08/04/2019, 10:00 AM

## 2019-08-04 NOTE — Op Note (Signed)
[facility]  Percutaneous Study/Intervention Procedural Note   Date of Surgery: 08/04/2019  Surgeon(s):,    Assistants:none  Pre-operative Diagnosis: PAD with claudication/rest Case bilateral lower extremities  Post-operative diagnosis:  Same  Procedure(s) Performed:             1.  Ultrasound guidance for vascular access left femoral artery             2.  Catheter placement into right common femoral artery from left femoral approach             3.  Aortogram and selective right lower extremity angiogram             4.  Percutaneous transluminal angioplasty of right tibioperoneal trunk and proximal peroneal artery with 2.5 mm diameter angioplasty balloon             5.   Percutaneous transluminal angioplasty of the right SFA and popliteal arteries with 4 mm diameter conventional and 5 mm diameter Lutonix drug-coated angioplasty balloons  6.  Viabahn stent placement x2 to the right SFA and proximal popliteal artery with a 6 mm diameter by 25 cm length and a 6 mm diameter by 5 cm length stent   7.  Percutaneous transluminal angioplasty of the distal right external iliac artery with 7 mm diameter by 6 cm likely tonics drug-coated angioplasty balloon             8.  StarClose closure device left femoral artery  EBL: 15 cc  Contrast: 95 cc  Fluoro Time: 20.6 minutes  Moderate Conscious Sedation Time: approximately 60 minutes using 4 mg of Versed and 100 mcg of Fentanyl              Indications:  Patient is a 78 y.o.male with severe peripheral arterial disease and Case in his legs with minimal activity and sometimes at night. The patient has noninvasive study showing markedly reduced flow bilaterally. The patient is brought in for angiography for further evaluation and potential treatment.  Due to the limb threatening nature of the situation, angiogram was performed for attempted limb salvage. The patient is aware that if the procedure fails, amputation  would be expected.  The patient also understands that even with successful revascularization, amputation may still be required due to the severity of the situation.  Risks and benefits are discussed and informed consent is obtained.   Procedure:  The patient was identified and appropriate procedural time out was performed.  The patient was then placed supine on the table and prepped and draped in the usual sterile fashion. Moderate conscious sedation was administered during a face to face encounter with the patient throughout the procedure with my supervision of the RN administering medicines and monitoring the patient's vital signs, pulse oximetry, telemetry and mental status throughout from the start of the procedure until the patient was taken to the recovery room. Ultrasound was used to evaluate the left common femoral artery.  It was patent but heavily calcific.  A digital ultrasound image was acquired.  A Seldinger needle was used to access the left common femoral artery under direct ultrasound guidance and a permanent image was performed.  A 0.035 J wire was advanced without resistance and a 5Fr sheath was placed.  Pigtail catheter was placed into the aorta and an AP aortogram was performed. This demonstrated that the renal arteries were calcific and had some degree of disease although was difficult to discern how bad.  The aorta was  calcific but not stenotic.  The right common iliac artery had a moderate degree of stenosis fairly near the origin as did the left common iliac artery.  The right external iliac artery just above the femoral head had about a 75 to 80% stenosis.  The left external iliac artery did not have significant stenosis.. I then crossed the aortic bifurcation and advanced to the right femoral head. Selective right lower extremity angiogram was then performed. This demonstrated large calcific lesions in the common femoral artery that appeared to be creating at least moderate stenosis.  The  profunda femoris artery had 2 areas of mild to moderate stenosis.  The SFA was occluded just couple of centimeters beyond its origin.  This reconstituted at Hunter's canal with the popliteal artery that was fairly normal beyond the proximal segment which was heavily diseased.  There was a large chunk of calcium at the tibioperoneal trunk and distal popliteal artery that was nearly occlusive in the tibioperoneal trunk.  The posterior tibial artery was then the best runoff to the foot with a peroneal artery that provided a second runoff vessel.  The anterior tibial artery was heavily diseased proximally and nearly occluded and then appeared to occlude just above the ankle. It was felt that it was in the patient's best interest to proceed with intervention after these images to avoid a second procedure and a larger amount of contrast and fluoroscopy based off of the findings from the initial angiogram. The patient was systemically heparinized and a 6 [place] [employer] sheath was then placed over the [employer] wire. I then used a Kumpe catheter and the advantage wire to navigate into the SFA occlusion and cross the occlusion.  The Kumpe catheter would not track so we exchanged for a CXI catheter and confirmed intraluminal flow in the popliteal artery just above the knee.  I then used the CXI catheter and the V 18 wire to cross the tibioperoneal trunk occlusion and parked the wire into the peroneal artery.  We then proceeded with treatment.  A 2.5 mm diameter by 30 cm length angioplasty balloon was used first to treat the tibioperoneal trunk and proximal peroneal artery and inflated to 14 atm for 1 minute.  Was then used to predilate the highly calcific SFA and popliteal lesions.  A 4 mm diameter by 30 cm length angioplasty balloon was then inflated twice from the mid popliteal artery up to the common femoral artery to encompass the entire SFA lesion.  Both of these inflations were about 12 atm for a minute.  After  the 4 mm balloon, we used a 5 mm diameter by 30 cm length and a 5 mm diameter by 22 cm length Lutonix drug-coated angioplasty balloon to treat from the above-knee popliteal artery up through the entire SFA into the common femoral artery.  These inflations were both 8 to 10 atm for a minute.  There remained multiple areas in the SFA and the most proximal portion of the popliteal artery that had residual greater than 50% stenosis after angioplasty.  For this, a 6 mm diameter by 25 cm length and a 6 mm diameter by 5 cm length viabahn stent was deployed from the proximal SFA down to just below Hunter's canal.  These were postdilated with 5 and then 6 mm diameter high-pressure angioplasty balloons with less than 20% residual stenosis after stent placement.  The tibioperoneal trunk lesion was also markedly improved with only about a 25 to 30% residual stenosis.  I then  pulled the sheath back and address the external iliac artery lesion.  A 7 mm diameter by 6 cm length Lutonix drug-coated angioplasty balloon was inflated to 10 atm for 1 minute.  Completion imaging showed only about a 20% residual stenosis after angioplasty of the external iliac artery.  I elected to terminate the procedure. The sheath was removed and StarClose closure device was deployed in the left femoral artery with excellent hemostatic result. The patient was taken to the recovery room in stable condition having tolerated the procedure well.  Findings:               Aortogram:  The renal arteries were calcific and had some degree of disease although was difficult to discern how bad.  The aorta was calcific but not stenotic.  The right common iliac artery had a moderate degree of stenosis fairly near the origin as did the left common iliac artery.  The right external iliac artery just above the femoral head had about a 75 to 80% stenosis.  The left external iliac artery did not have significant stenosis.             Right lower Extremity:  This  demonstrated large calcific lesions in the common femoral artery that appeared to be creating at least moderate stenosis.  The profunda femoris artery had 2 areas of mild to moderate stenosis.  The SFA was occluded just couple of centimeters beyond its origin.  This reconstituted at Hunter's canal with the popliteal artery that was fairly normal beyond the proximal segment which was heavily diseased.  There was a large chunk of calcium at the tibioperoneal trunk and distal popliteal artery that was nearly occlusive in the tibioperoneal trunk.  The posterior tibial artery was then the best runoff to the foot with a peroneal artery that provided a second runoff vessel.  The anterior tibial artery was heavily diseased proximally and nearly occluded and then appeared to occlude just above the ankle   Disposition: Patient was taken to the recovery room in stable condition having tolerated the procedure well.  Complications: None  Kevin Case 08/04/2019 11:54 AM   This note was created with Dragon Medical transcription system. Any errors in dictation are purely unintentional.

## 2019-08-05 ENCOUNTER — Encounter: Payer: Self-pay | Admitting: Vascular Surgery

## 2019-08-27 ENCOUNTER — Ambulatory Visit (INDEPENDENT_AMBULATORY_CARE_PROVIDER_SITE_OTHER): Payer: Medicare Other | Admitting: Vascular Surgery

## 2019-08-27 ENCOUNTER — Encounter (INDEPENDENT_AMBULATORY_CARE_PROVIDER_SITE_OTHER): Payer: Medicare Other

## 2019-08-29 ENCOUNTER — Encounter (INDEPENDENT_AMBULATORY_CARE_PROVIDER_SITE_OTHER): Payer: Self-pay

## 2019-08-30 ENCOUNTER — Other Ambulatory Visit (INDEPENDENT_AMBULATORY_CARE_PROVIDER_SITE_OTHER): Payer: Self-pay | Admitting: Vascular Surgery

## 2019-08-30 DIAGNOSIS — Z95820 Peripheral vascular angioplasty status with implants and grafts: Secondary | ICD-10-CM

## 2019-08-30 DIAGNOSIS — I70213 Atherosclerosis of native arteries of extremities with intermittent claudication, bilateral legs: Secondary | ICD-10-CM

## 2019-08-31 ENCOUNTER — Ambulatory Visit (INDEPENDENT_AMBULATORY_CARE_PROVIDER_SITE_OTHER): Payer: Medicare Other

## 2019-08-31 ENCOUNTER — Other Ambulatory Visit: Payer: Self-pay

## 2019-08-31 ENCOUNTER — Ambulatory Visit (INDEPENDENT_AMBULATORY_CARE_PROVIDER_SITE_OTHER): Payer: Medicare Other | Admitting: Vascular Surgery

## 2019-08-31 ENCOUNTER — Encounter (INDEPENDENT_AMBULATORY_CARE_PROVIDER_SITE_OTHER): Payer: Self-pay | Admitting: Vascular Surgery

## 2019-08-31 VITALS — BP 137/61 | HR 48 | Resp 14 | Ht 69.0 in | Wt 153.0 lb

## 2019-08-31 DIAGNOSIS — Z95820 Peripheral vascular angioplasty status with implants and grafts: Secondary | ICD-10-CM | POA: Diagnosis not present

## 2019-08-31 DIAGNOSIS — K551 Chronic vascular disorders of intestine: Secondary | ICD-10-CM

## 2019-08-31 DIAGNOSIS — I10 Essential (primary) hypertension: Secondary | ICD-10-CM

## 2019-08-31 DIAGNOSIS — E1122 Type 2 diabetes mellitus with diabetic chronic kidney disease: Secondary | ICD-10-CM

## 2019-08-31 DIAGNOSIS — I70219 Atherosclerosis of native arteries of extremities with intermittent claudication, unspecified extremity: Secondary | ICD-10-CM | POA: Diagnosis not present

## 2019-08-31 DIAGNOSIS — I70213 Atherosclerosis of native arteries of extremities with intermittent claudication, bilateral legs: Secondary | ICD-10-CM | POA: Diagnosis not present

## 2019-08-31 DIAGNOSIS — N183 Chronic kidney disease, stage 3 unspecified: Secondary | ICD-10-CM

## 2019-08-31 NOTE — Progress Notes (Signed)
MRN : [id number]  Kevin Case is a 78 y.o. (18-Sep-1941) male who presents with chief complaint of  Chief Complaint  Patient presents with  . Follow-up    [facility] f/u  .  History of Present Illness: Patient returns today in follow up of multiple vascular issues.  Several months ago, he underwent SMA revascularization for severe mesenteric ischemia with acute on chronic component.  He has done great since that time and is gaining weight and eating well. About a month ago, he underwent extensive right lower extremity revascularization.  Since that time, he has resumed walking fairly normally without assistance.  His right leg is stronger and he has more energy.  He is currently not limited by rest pain or claudication of the left leg. He had no periprocedural complications.  His right ABI is markedly improved today at 1.05.  His left ABI stable at 0.51.  Current Outpatient Medications  Medication Sig Dispense Refill  . acetaminophen (TYLENOL) 500 MG tablet Take 1,000 mg by mouth every 6 (six) hours as needed (headache.).    Marland Kitchen aspirin EC 81 MG tablet Take 1 tablet (81 mg total) by mouth daily. 150 tablet 2  . atorvastatin (LIPITOR) 80 MG tablet Take 80 mg by mouth at bedtime.     . clopidogrel (PLAVIX) 75 MG tablet Take 1 tablet (75 mg total) by mouth daily. 30 tablet 2  . cyanocobalamin 100 MCG tablet Take 100 mcg by mouth daily.    Marland Kitchen docusate sodium (COLACE) 100 MG capsule Take 100 mg by mouth daily.     . ferrous sulfate 325 (65 FE) MG EC tablet Take 1 tablet (325 mg total) by mouth 2 (two) times daily. 60 tablet 3  . JARDIANCE 10 MG TABS tablet Take 10 mg by mouth daily.    Marland Kitchen losartan (COZAAR) 25 MG tablet Take 0.5 tablets (12.5 mg total) by mouth daily. 45 tablet 3  . Melatonin 10 MG TABS Take 10 mg by mouth at bedtime.     . Multiple Vitamin (MULTIVITAMIN WITH MINERALS) TABS tablet Take 1 tablet by mouth daily. Multivitamin for Senior 50+    . Omega-3 Fatty Acids (FISH OIL) 1000 MG  CAPS Take 1,000 mg by mouth daily.    Marland Kitchen omeprazole (PRILOSEC) 40 MG capsule Take 40 mg by mouth as needed.      No current facility-administered medications for this visit.     Past Medical History:  Diagnosis Date  . Anemia   . Blood transfusion without reported diagnosis   . CAD (coronary artery disease)    a. 03/2019 Cath: LM nl, LAD 50p CA2+, 22m, D1 50ost, LCX 20p, 22m, RCA small, mild diff dzs-->Med rx. Consider PCI LCX for refractory angina.  . Carotid arterial disease (HCC)    a. 03/2019 Carotid U/S: RICA 1-39%, RECA <50%, LICA 1-39%, LECA <50%.  . CHF (congestive heart failure) (HCC)   . Clotting disorder (HCC)   . Diabetes mellitus without complication (HCC)   . GIB (gastrointestinal bleeding)    a. 03/2019 BRBPR following PV procedure req anticoagulation-->2u PRBCs.  . Heart murmur   . HFrEF (heart failure with reduced ejection fraction) (HCC)    a. 03/2019 Echo: EF 30-35%, mild conc LVH. Mildly dil LA. Mod MV annular dil. Mod AS (mean grad , Valve area 0.76).  Marland Kitchen History of tobacco abuse   . Hypertension   . Ischemic cardiomyopathy    a. 03/2019 Echo: EF 30-35%.  . Left bundle branch block   .  Moderate aortic stenosis    a. 03/2019 Echo:  Mod AS (mean grad , Valve area 0.76).  Marland Kitchen PAD (peripheral artery disease) (HCC)    a. 03/2019 PV Angio: RCFA 80, RSFA 100.  . Rib fracture   . Sinus bradycardia   . Subclavian arterial stenosis (HCC)    a. 03/2019 PV Angio: RSCA 90 after origin of CCA. RCCA 30ost, LSCA 80 (PTA and covered stenting).    Past Surgical History:  Procedure Laterality Date  . AORTIC ARCH ANGIOGRAPHY N/A 03/31/2019   Procedure: AORTIC ARCH ANGIOGRAPHY;  Surgeon: Iran Ouch, MD;  Location: [facility];  Service: Cardiovascular;  Laterality: N/A;  . CARDIAC CATHETERIZATION    . ESOPHAGOGASTRODUODENOSCOPY (EGD) WITH PROPOFOL N/A 05/27/2019   Procedure: ESOPHAGOGASTRODUODENOSCOPY (EGD) WITH PROPOFOL;  Surgeon: Toney Reil, MD;   Location: [facility] ENDOSCOPY;  Service: Gastroenterology;  Laterality: N/A;  . LOWER EXTREMITY ANGIOGRAPHY Right 08/04/2019   Procedure: LOWER EXTREMITY ANGIOGRAPHY;  Surgeon: Annice Needy, MD;  Location: [facility] INVASIVE CV LAB;  Service: Cardiovascular;  Laterality: Right;  . PERIPHERAL VASCULAR INTERVENTION Left 03/31/2019   Procedure: PERIPHERAL VASCULAR INTERVENTION;  Surgeon: Iran Ouch, MD;  Location: [facility];  Service: Cardiovascular;  Laterality: Left;  . RIGHT/LEFT HEART CATH AND CORONARY ANGIOGRAPHY N/A 03/15/2019   Procedure: RIGHT/LEFT HEART CATH AND CORONARY ANGIOGRAPHY;  Surgeon: Iran Ouch, MD;  Location: [facility] CV [facility];  Service: Cardiovascular;  Laterality: N/A;  . VISCERAL ANGIOGRAPHY N/A 05/28/2019   Procedure: VISCERAL ANGIOGRAPHY;  Surgeon: Annice Needy, MD;  Location: [facility]VAS[facility] CV [facility];  Service: Cardiovascular;  Laterality: N/A;    Social History Social History   Tobacco Use  . Smoking status: Former Smoker    Packs/day: 1.50    Years: 57.00    Pack years: 85.50    Types: Cigarettes    Quit date: 2012    Years since quitting: 8.8  . Smokeless tobacco: Never Used  Substance Use Topics  . Alcohol use: No    Frequency: Never  . Drug use: Never     Family History Family History  Problem Relation Age of Onset  . Congestive Heart Failure Father        had AICD placed.   . Dementia Father   . Dementia Mother      No Known Allergies  REVIEW OF SYSTEMS (Negative unless checked)  Constitutional: [x] ?Weight loss  [] ?Fever  [] ?Chills Cardiac: [] ?Chest pain   [] ?Chest pressure   [] ?Palpitations   [] ?Shortness of breath when laying flat   [] ?Shortness of breath at rest   [] ?Shortness of breath with exertion. Vascular:  [x] ?Pain in legs with walking   [x] ?Pain in legs at rest   [] ?Pain in legs when laying flat   [x] ?Claudication   [] ?Pain in feet when walking  [x] ?Pain in feet at rest  [] ?Pain in feet when laying flat   [] ?History of DVT    [] ?Phlebitis   [] ?Swelling in legs   [] ?Varicose veins   [] ?Non-healing ulcers Pulmonary:   [] ?Uses home oxygen   [] ?Productive cough   [] ?Hemoptysis   [] ?Wheeze  [] ?COPD   [] ?Asthma Neurologic:  [] ?Dizziness  [] ?Blackouts   [] ?Seizures   [] ?History of stroke   [] ?History of TIA  [] ?Aphasia   [] ?Temporary blindness   [] ?Dysphagia   [] ?Weakness or numbness in arms   [] ?Weakness or numbness in legs Musculoskeletal:  [x] ?Arthritis   [] ?Joint swelling   [x] ?Joint pain   [] ?Low back pain Hematologic:  [] ?Easy bruising  [] ?  Easy bleeding   [] ?Hypercoagulable state   [x] ?Anemic   Gastrointestinal:  [] ?Blood in stool   [] ?Vomiting blood  [x] ?Gastroesophageal reflux/heartburn   [x] ?Abdominal pain Genitourinary:  [] ?Chronic kidney disease   [] ?Difficult urination  [] ?Frequent urination  [] ?Burning with urination   [] ?Hematuria Skin:  [] ?Rashes   [] ?Ulcers   [] ?Wounds Psychological:  [] ?History of anxiety   [] ? History of major depression.  Physical Examination  BP 137/61 (BP Location: Left Arm)   Pulse (!) 48   Resp 14   Ht 5\' 9"  (1.753 m)   Wt 153 lb (69.4 kg)   BMI 22.59 kg/m  Gen:  WD/WN, NAD Head: [place]/AT, No temporalis wasting. Ear/Nose/Throat: Hearing grossly intact, nares w/o erythema or drainage Eyes: Conjunctiva clear. Sclera non-icteric Neck: Supple.  Trachea midline Pulmonary:  Good air movement, no use of accessory muscles.  Cardiac: Irregular Vascular:  Vessel Right Left  Radial Palpable Palpable                          PT Palpable Not Palpable  DP Palpable 1+ Palpable   Gastrointestinal: soft, non-tender/non-distended. No guarding/reflex.  Musculoskeletal: M/S 5/5 throughout.  No deformity or atrophy. Trace RLE edema. Neurologic: Sensation grossly intact in extremities.  Symmetrical.  Speech is fluent.  Psychiatric: Judgment intact, Mood & affect appropriate for pt's clinical situation. Dermatologic: No rashes or ulcers noted.  No cellulitis or open wounds.        Labs Recent Results (from the past 2160 hour(s))  CBC w/Diff/Platelet     Status: Abnormal   Collection Time: 06/09/19 12:06 PM  Result Value Ref Range   WBC 6.6 3.4 - 10.8 x10E3/uL   RBC 4.23 4.14 - 5.80 x10E6/uL    Comment: Acanthocytes present.   Hemoglobin 11.7 (L) 13.0 - 17.7 g/dL   Hematocrit 36.9 (L) 37.5 - 51.0 %   MCV 87 79 - 97 fL   MCH 27.7 26.6 - 33.0 pg   MCHC 31.7 31.5 - 35.7 g/dL   RDW 23.2 (H) 11.6 - 15.4 %   Platelets 187 150 - 450 x10E3/uL    Comment: Platelets appear clumped.   Neutrophils 46 Not Estab. %   Lymphs 42 Not Estab. %   Monocytes 10 Not Estab. %   Eos 1 Not Estab. %   Basos 1 Not Estab. %   Neutrophils Absolute 3.1 1.4 - 7.0 x10E3/uL   Lymphocytes Absolute 2.8 0.7 - 3.1 x10E3/uL   Monocytes Absolute 0.7 0.1 - 0.9 x10E3/uL   EOS (ABSOLUTE) 0.1 0.0 - 0.4 x10E3/uL   Basophils Absolute 0.0 0.0 - 0.2 x10E3/uL   Immature Granulocytes 0 Not Estab. %   Immature Grans (Abs) 0.0 0.0 - 0.1 x10E3/uL   Hematology Comments: Note:     Comment: Verified by microscopic examination.  Basic Metabolic Panel (BMET)     Status: Abnormal   Collection Time: 06/09/19 12:06 PM  Result Value Ref Range   Glucose 110 (H) 65 - 99 mg/dL   BUN 24 8 - 27 mg/dL   Creatinine, Ser 1.07 0.76 - 1.27 mg/dL   GFR calc non Af Amer 68 >59 mL/min/1.73   GFR calc Af Amer 78 >59 mL/min/1.73   BUN/Creatinine Ratio 22 10 - 24   Sodium 143 134 - 144 mmol/L   Potassium 4.8 3.5 - 5.2 mmol/L   Chloride 105 96 - 106 mmol/L   CO2 22 20 - 29 mmol/L   Calcium 8.9 8.6 - 10.2 mg/dL  LDL  cholesterol, direct     Status: None   Collection Time: 06/10/19 11:27 AM  Result Value Ref Range   LDL Direct 50 0 - 99 mg/dL  Lipid panel     Status: Abnormal   Collection Time: 06/10/19 11:30 AM  Result Value Ref Range   Cholesterol, Total 108 100 - 199 mg/dL   Triglycerides 161 0 - 149 mg/dL   HDL 33 (L) >09 mg/dL   VLDL Cholesterol Cal 25 5 - 40 mg/dL   LDL Calculated 50 0 - 99 mg/dL   Chol/HDL  Ratio 3.3 0.0 - 5.0 ratio    Comment:                                   T. Chol/HDL Ratio                                             Men  Women                               1/2 Avg.Risk  3.4    3.3                                   Avg.Risk  5.0    4.4                                2X Avg.Risk  9.6    7.1                                3X Avg.Risk 23.4   11.0   Hepatic function panel     Status: Abnormal   Collection Time: 06/10/19 11:30 AM  Result Value Ref Range   Total Protein 6.8 6.0 - 8.5 g/dL   Albumin 3.6 (L) 3.7 - 4.7 g/dL   Bilirubin Total 0.2 0.0 - 1.2 mg/dL   Bilirubin, Direct 6.04 0.00 - 0.40 mg/dL   Alkaline Phosphatase 98 39 - 117 IU/L   AST 58 (H) 0 - 40 IU/L   ALT 45 (H) 0 - 44 IU/L  Basic metabolic panel     Status: Abnormal   Collection Time: 06/17/19  8:49 AM  Result Value Ref Range   Sodium 138 135 - 145 mmol/L   Potassium 4.0 3.5 - 5.1 mmol/L   Chloride 105 98 - 111 mmol/L   CO2 24 22 - 32 mmol/L   Glucose, Bld 139 (H) 70 - 99 mg/dL   BUN 28 (H) 8 - 23 mg/dL   Creatinine, Ser 5.40 0.61 - 1.24 mg/dL   Calcium 8.9 8.9 - 98.1 mg/dL   GFR calc non Af Amer >60 >60 mL/min   GFR calc Af Amer >60 >60 mL/min   Anion gap 9 5 - 15    Comment: Performed at [facility], [address] [place], [place] [address]  B12 and Folate Panel     Status: Abnormal   Collection Time: 06/22/19  3:31 PM  Result Value Ref Range   Vitamin B-12 1,270 (H) 232 - 1,245 pg/mL  Folate 17.6 >3.0 ng/mL    Comment: A serum folate concentration of less than 3.1 ng/mL is considered to represent clinical deficiency.   CBC     Status: Abnormal   Collection Time: 06/22/19  3:31 PM  Result Value Ref Range   WBC 7.5 3.4 - 10.8 x10E3/uL   RBC 4.14 4.14 - 5.80 x10E6/uL   Hemoglobin 12.1 (L) 13.0 - 17.7 g/dL   Hematocrit 16.137.9 09.637.5 - 51.0 %   MCV 92 79 - 97 fL   MCH 29.2 26.6 - 33.0 pg   MCHC 31.9 31.5 - 35.7 g/dL   RDW 04.521.6 (H) 40.911.6 - 81.115.4 %   Platelets 185 150 - 450 x10E3/uL   Iron and TIBC     Status: None   Collection Time: 06/22/19  3:31 PM  Result Value Ref Range   Total Iron Binding Capacity 275 250 - 450 ug/dL   UIBC 914213 782111 - 956343 ug/dL   Iron 62 38 - 213169 ug/dL   Iron Saturation 23 15 - 55 %  Ferritin     Status: None   Collection Time: 06/22/19  3:31 PM  Result Value Ref Range   Ferritin 105 30 - 400 ng/mL  SARS CORONAVIRUS 2 (TAT 6-24 HRS) Nasopharyngeal Nasopharyngeal Swab     Status: None   Collection Time: 07/30/19  3:58 PM   Specimen: Nasopharyngeal Swab  Result Value Ref Range   SARS Coronavirus 2 NEGATIVE NEGATIVE    Comment: (NOTE) SARS-CoV-2 target nucleic acids are NOT DETECTED. The SARS-CoV-2 RNA is generally detectable in upper and lower respiratory specimens during the acute phase of infection. Negative results do not preclude SARS-CoV-2 infection, do not rule out co-infections with other pathogens, and should not be used as the sole basis for treatment or other patient management decisions. Negative results must be combined with clinical observations, patient history, and epidemiological information. The expected result is Negative. Fact Sheet for Patients: HairSlick.nohttps://www.fda.gov/media/138098/download Fact Sheet for Healthcare Providers: quierodirigir.comhttps://www.fda.gov/media/138095/download This test is not yet approved or cleared by the [place] FDA and  has been authorized for detection and/or diagnosis of SARS-CoV-2 by FDA under an Emergency Use Authorization (EUA). This EUA will remain  in effect (meaning this test can be used) for the duration of the COVID-19 declaration under Section 56 4(b)(1) of the Act, 21 U.S.C. section 360bbb-3(b)(1), unless the authorization is terminated or revoked sooner. Performed at [facility], [address] [place], [place] [address]   BUN     Status: Abnormal   Collection Time: 08/04/19  9:37 AM  Result Value Ref Range   BUN 29 (H) 8 - 23 mg/dL    Comment: Performed at [facility]  [facility], [address] [place], [place] [address]  Creatinine, serum     Status: None   Collection Time: 08/04/19  9:37 AM  Result Value Ref Range   Creatinine, Ser 1.07 0.61 - 1.24 mg/dL   GFR calc non Af Amer >60 >60 mL/min   GFR calc Af Amer >60 >60 mL/min    Comment: Performed at [facility], [address] [place], [place] [address]  Glucose, capillary     Status: Abnormal   Collection Time: 08/04/19  9:41 AM  Result Value Ref Range   Glucose-Capillary 122 (H) 70 - 99 mg/dL  Glucose, capillary     Status: Abnormal   Collection Time: 08/04/19 11:55 AM  Result Value Ref Range   Glucose-Capillary 106 (H) 70 - 99 mg/dL  Radiology No results found.  Assessment/Plan Essential hypertension blood pressure control important in reducing the progression of atherosclerotic disease. On appropriate oral medications.   Type 2 diabetes mellitus with stage 3 chronic kidney disease, without long-term current use of insulin (HCC) blood glucose control important in reducing the progression of atherosclerotic disease. Also, involved in wound healing. On appropriate medications.   Chronic mesenteric ischemia [facility](HCC) The patient was literally near-death in the hospital a few months ago requiring pressors to stabilize his blood pressure and essentially dying from chronic visceral ischemia.  He looks like a new man today.  He still thin and has a ways to go, but his symptoms are markedly improved after intervention.  Superior mesenteric artery stenosis (HCC) Gaining weight and eating well.  Plan on checking this at his next follow-up visit in 3 months with duplex  Atherosclerotic peripheral vascular disease with intermittent claudication (HCC) ABIs today are markedly improved at 1.05 on the right and stable at 0.51 on the left.  He is now walking without assistance good distances without pain.  Clinically, he is markedly improved after his right leg revascularization a few weeks ago.   His reperfusion swelling at this point is fairly mild and he is wearing a compression stocking that is controlling this.  We will plan on rechecking him in 3 months with noninvasive studies as he says he is not having much in the way of symptoms in his left leg at this point I would not proceed with a left leg intervention currently.    Festus BarrenJason Adalis Gatti, MD  08/31/2019 2:43 PM    This note was created with Dragon medical transcription system.  Any errors from dictation are purely unintentional

## 2019-08-31 NOTE — Assessment & Plan Note (Signed)
Gaining weight and eating well.  Plan on checking this at his next follow-up visit in 3 months with duplex

## 2019-08-31 NOTE — Assessment & Plan Note (Signed)
ABIs today are markedly improved at 1.05 on the right and stable at 0.51 on the left.  He is now walking without assistance good distances without pain.  Clinically, he is markedly improved after his right leg revascularization a few weeks ago.  His reperfusion swelling at this point is fairly mild and he is wearing a compression stocking that is controlling this.  We will plan on rechecking him in 3 months with noninvasive studies as he says he is not having much in the way of symptoms in his left leg at this point I would not proceed with a left leg intervention currently.

## 2019-09-06 ENCOUNTER — Encounter (INDEPENDENT_AMBULATORY_CARE_PROVIDER_SITE_OTHER): Payer: Self-pay

## 2019-09-20 MED ORDER — ATORVASTATIN CALCIUM 80 MG PO TABS: 80.0000 mg | ORAL_TABLET | Freq: Every day | ORAL | 0 refills | Status: DC

## 2019-09-22 ENCOUNTER — Other Ambulatory Visit: Payer: Self-pay

## 2019-09-22 ENCOUNTER — Encounter: Payer: Self-pay | Admitting: Gastroenterology

## 2019-09-22 ENCOUNTER — Encounter: Payer: Self-pay | Admitting: Physician Assistant

## 2019-09-22 ENCOUNTER — Ambulatory Visit (INDEPENDENT_AMBULATORY_CARE_PROVIDER_SITE_OTHER): Payer: Medicare Other | Admitting: Gastroenterology

## 2019-09-22 VITALS — BP 122/56 | HR 61 | Temp 98.6°F | Resp 16 | Ht 69.0 in | Wt 158.6 lb

## 2019-09-22 DIAGNOSIS — K5289 Other specified noninfective gastroenteritis and colitis: Secondary | ICD-10-CM

## 2019-09-22 DIAGNOSIS — K259 Gastric ulcer, unspecified as acute or chronic, without hemorrhage or perforation: Secondary | ICD-10-CM

## 2019-09-22 NOTE — Progress Notes (Signed)
[name], MD [address]  [place]  [place], [place] [address]  Main: [phone]  Fax: [phone]    Gastroenterology Consultation  Referring Provider:     Trinna Post, PA-C Primary Care Physician:  Paulene Floor Primary Gastroenterologist:  Dr. [name] Reason for Consultation: Follow-up of ischemic gastritis        HPI:   Kevin Case is a 78 y.o. male referred by Dr. Trinna Post, PA-C  for consultation & management of hospital follow-up, ischemic gastritis.  Patient with known history of peptic ulcer disease, presented to [facility] with hematemesis in end of July.  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  Patient has nicely recovered from this acute event and has done tremendously well since discharge.  His appetite is restored to normal, eating 3 meals a day, gained few pounds.  He is also having protein shake in the morning after breakfast.  He denies any abdominal pain, nausea or vomiting, black stools.  He is taking omeprazole 40 mg twice daily, Plavix 75 mg daily.  He did not have any concerns today.  He is taking oral iron 2 pills a day, B12 1 pill daily  Follow-up visit 09/22/2019 Patient is doing very well from GI standpoint.  He continues to gain weight and appetite is good.  He denies any GI symptoms today.  He denies black stools, rectal bleeding, epigastric pain, nausea or vomiting.  He continues to take omeprazole 40 mg 2 times a day.  He is still taking iron and B12 supplements although I advised him to stop based on the last lab results   NSAIDs: None  Antiplts/Anticoagulants/Anti thrombotics: Plavix  GI Procedures: EGD 05/27/2019 - Normal duodenal bulb and second portion of the duodenum. - Oozing gastric ulcers with oozing hemorrhage ([place] [facility]  Ib). Injected. - Ischemic gastritis resulting in necrotic ulcer in gastric body - Normal gastroesophageal junction and esophagus. - No specimens collected.  Past Medical History:  Diagnosis Date  . Anemia   . Blood transfusion without reported diagnosis   . CAD (coronary artery disease)    a. 03/2019 Cath: LM nl, LAD 50p CA2+, 29m, D1 50ost, LCX 20p, 88m, RCA small, mild diff dzs-->Med rx. Consider PCI LCX for refractory angina.  . Carotid arterial disease ([place])    a. 03/2019 Carotid U/S: RICA 1-76%, RECA <16%, LICA 0-73%, LECA <71%.  . CHF (congestive heart failure) ([place])   . Clotting disorder ([place])   . Diabetes mellitus without complication ([place])   . GIB (gastrointestinal bleeding)    a. 03/2019 BRBPR following PV procedure req anticoagulation-->2u PRBCs.  . Heart murmur   . HFrEF (heart failure with reduced ejection fraction) ([place])    a. 03/2019 Echo: EF 30-35%, mild conc LVH. Mildly dil LA. Mod MV annular dil. Mod AS (mean grad 56mmHg, Valve area 0.76).  Marland Kitchen History of tobacco abuse   . Hypertension   . Ischemic cardiomyopathy    a. 03/2019 Echo: EF 30-35%.  . Left bundle branch block   . Moderate aortic stenosis    a. 03/2019 Echo:  Mod AS (mean grad 61mmHg, Valve area 0.76).  Marland Kitchen PAD (peripheral artery disease) ([place])    a. 03/2019 PV Angio: RCFA 80, RSFA 100.  . Rib fracture   . Sinus bradycardia   . Subclavian arterial stenosis (  HCC)    a. 03/2019 PV Angio: RSCA 90 after origin of CCA. RCCA 30ost, LSCA 80 (PTA and covered stenting).    Past Surgical History:  Procedure Laterality Date  . AORTIC ARCH ANGIOGRAPHY N/A 03/31/2019   Procedure: AORTIC ARCH ANGIOGRAPHY;  Surgeon: Iran Ouch, MD;  Location: [facility] INVASIVE CV LAB;  Service: Cardiovascular;  Laterality: N/A;  . CARDIAC CATHETERIZATION    . ESOPHAGOGASTRODUODENOSCOPY (EGD) WITH PROPOFOL N/A 05/27/2019   Procedure: ESOPHAGOGASTRODUODENOSCOPY (EGD) WITH PROPOFOL;  Surgeon: Toney Reil, MD;  Location: [facility] ENDOSCOPY;   Service: Gastroenterology;  Laterality: N/A;  . LOWER EXTREMITY ANGIOGRAPHY Right 08/04/2019   Procedure: LOWER EXTREMITY ANGIOGRAPHY;  Surgeon: Annice Needy, MD;  Location: [facility] INVASIVE CV LAB;  Service: Cardiovascular;  Laterality: Right;  . PERIPHERAL VASCULAR INTERVENTION Left 03/31/2019   Procedure: PERIPHERAL VASCULAR INTERVENTION;  Surgeon: Iran Ouch, MD;  Location: [facility] INVASIVE CV LAB;  Service: Cardiovascular;  Laterality: Left;  . RIGHT/LEFT HEART CATH AND CORONARY ANGIOGRAPHY N/A 03/15/2019   Procedure: RIGHT/LEFT HEART CATH AND CORONARY ANGIOGRAPHY;  Surgeon: Iran Ouch, MD;  Location: [facility] INVASIVE CV LAB;  Service: Cardiovascular;  Laterality: N/A;  . VISCERAL ANGIOGRAPHY N/A 05/28/2019   Procedure: VISCERAL ANGIOGRAPHY;  Surgeon: Annice Needy, MD;  Location: [facility] INVASIVE CV LAB;  Service: Cardiovascular;  Laterality: N/A;    Current Outpatient Medications:  .  acetaminophen (TYLENOL) 500 MG tablet, Take 1,000 mg by mouth every 6 (six) hours as needed (headache.)., Disp: , Rfl:  .  aspirin EC 81 MG tablet, Take 1 tablet (81 mg total) by mouth daily., Disp: 150 tablet, Rfl: 2 .  atorvastatin (LIPITOR) 80 MG tablet, Take 1 tablet (80 mg total) by mouth at bedtime., Disp: 30 tablet, Rfl: 0 .  clopidogrel (PLAVIX) 75 MG tablet, Take 1 tablet (75 mg total) by mouth daily., Disp: 30 tablet, Rfl: 2 .  cyanocobalamin 100 MCG tablet, Take 100 mcg by mouth daily., Disp: , Rfl:  .  docusate sodium (COLACE) 100 MG capsule, Take 100 mg by mouth daily. , Disp: , Rfl:  .  ferrous sulfate 325 (65 FE) MG EC tablet, Take 1 tablet (325 mg total) by mouth 2 (two) times daily., Disp: 60 tablet, Rfl: 3 .  JARDIANCE 10 MG TABS tablet, Take 10 mg by mouth daily., Disp: , Rfl:  .  Melatonin 10 MG TABS, Take 10 mg by mouth at bedtime. , Disp: , Rfl:  .  Multiple Vitamin (MULTIVITAMIN WITH MINERALS) TABS tablet, Take 1 tablet by mouth daily. Multivitamin for Senior 50+, Disp: , Rfl:  .  Omega-3  Fatty Acids (FISH OIL) 1000 MG CAPS, Take 1,000 mg by mouth daily., Disp: , Rfl:  .  omeprazole (PRILOSEC) 40 MG capsule, Take 40 mg by mouth as needed. , Disp: , Rfl:  .  losartan (COZAAR) 25 MG tablet, Take 0.5 tablets (12.5 mg total) by mouth daily., Disp: 45 tablet, Rfl: 3 .  oxyCODONE (OXY IR/ROXICODONE) 5 MG immediate release tablet, oxycodone 5 mg tablet  Take 1 tablet every 6 hours by oral route., Disp: , Rfl:    Family History  Problem Relation Age of Onset  . Congestive Heart Failure Father        had AICD placed.   . Dementia Father   . Dementia Mother      Social History   Tobacco Use  . Smoking status: Former Smoker    Packs/day: 1.50    Years: 57.00    Pack years:  85.50    Types: Cigarettes    Quit date: 2012    Years since quitting: 8.8  . Smokeless tobacco: Never Used  Substance Use Topics  . Alcohol use: No    Frequency: Never  . Drug use: Never    Allergies as of 09/22/2019  . (No Known Allergies)    Review of Systems:    All systems reviewed and negative except where noted in HPI.   Physical Exam:  BP (!) 122/56 (BP Location: Left Arm, Patient Position: Sitting, Cuff Size: Normal)   Pulse 61   Temp 98.6 F (37 C)   Resp 16   Ht 5\' 9"  (1.753 m)   Wt 158 lb 9.6 oz (71.9 kg)   BMI 23.42 kg/m  No LMP for male patient.  General:   Alert, thin built, moderately nourished for his age, pleasant and cooperative in NAD Head:  Normocephalic and atraumatic. Eyes:  Sclera clear, no icterus.   Conjunctiva pink. Ears:  Normal auditory acuity. Nose:  No deformity, discharge, or lesions. Mouth:  No deformity or lesions,oropharynx pink & moist. Neck:  Supple; no masses or thyromegaly. Lungs:  Respirations even and unlabored.  Clear throughout to auscultation.   No wheezes, crackles, or rhonchi. No acute distress. Heart:  Regular rate and rhythm; no murmurs, clicks, rubs, or gallops. Abdomen:  Normal bowel sounds. Soft, non-tender and non-distended without  masses, hepatosplenomegaly or hernias noted.  No guarding or rebound tenderness.   Rectal: Not performed Msk:  Symmetrical without gross deformities. Good, equal movement & strength bilaterally. Pulses:  Normal pulses noted. Extremities:  No clubbing or edema.  No cyanosis. Neurologic:  Alert and oriented x3;  grossly normal neurologically. Skin:  Intact without significant lesions or rashes. No jaundice. Psych:  Alert and cooperative. Normal mood and affect.  Imaging Studies: Reviewed  Assessment and Plan:   Kevin Case is a 78 y.o. 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  Currently doing well, asymptomatic, he is tolerating diet well  Continue omeprazole 40 mg twice daily  Continue Plavix 75 mg daily Check CBC, iron studies, B12 and folate panel Advised him to stop oral B12 and iron supplements Recommend repeat EGD to confirm healing of the ulcers from ischemic gastritis and also to determine the need for long-term PPI use   Follow up as needed   61, MD

## 2019-09-22 NOTE — Patient Instructions (Addendum)
1.  You can stop oral iron and B12 supplements 2.  We will check labs today 3.  We will schedule EGD for follow-up of ischemic gastritis 4.  Continue omeprazole 40 mg daily before breakfast or dinner, long-term  Please call our office to speak with my nurse Barnie Alderman [phone] during business hours from 8am to 4pm if you have any questions/concerns. During after hours, you will be redirected to on call GI physician. For any emergency please call 911 or go the nearest emergency room.    [name], MD [address]  [place]  [place], [place] [address]  Main: [phone]  Fax: [phone]

## 2019-09-23 ENCOUNTER — Encounter: Payer: Self-pay | Admitting: Gastroenterology

## 2019-09-23 ENCOUNTER — Telehealth: Payer: Self-pay | Admitting: Gastroenterology

## 2019-09-23 LAB — CBC
Hematocrit: 40 % (ref 37.5–51.0)
Hemoglobin: 14.1 g/dL (ref 13.0–17.7)
MCH: 33.5 pg — ABNORMAL HIGH (ref 26.6–33.0)
MCHC: 35.3 g/dL (ref 31.5–35.7)
MCV: 95 fL (ref 79–97)
Platelets: 171 10*3/uL (ref 150–450)
RBC: 4.21 x10E6/uL (ref 4.14–5.80)
RDW: 14.5 % (ref 11.6–15.4)
WBC: 10.2 10*3/uL (ref 3.4–10.8)

## 2019-09-23 LAB — IRON AND TIBC
Iron Saturation: 44 % (ref 15–55)
Iron: 126 ug/dL (ref 38–169)
Total Iron Binding Capacity: 287 ug/dL (ref 250–450)
UIBC: 161 ug/dL (ref 111–343)

## 2019-09-23 LAB — FERRITIN: Ferritin: 66 ng/mL (ref 30–400)

## 2019-09-23 LAB — B12 AND FOLATE PANEL
Folate: >20 ng/mL (ref >3.0)
Vitamin B-12: 1105 pg/mL (ref 232–1245)

## 2019-09-23 NOTE — Telephone Encounter (Signed)
Pt wife left vm to change the location of pts procedure please return her call

## 2019-09-24 ENCOUNTER — Encounter: Payer: Self-pay | Admitting: Physician Assistant

## 2019-09-24 ENCOUNTER — Encounter: Payer: Self-pay | Admitting: Gastroenterology

## 2019-09-24 ENCOUNTER — Other Ambulatory Visit: Payer: Self-pay

## 2019-09-24 ENCOUNTER — Telehealth: Payer: Self-pay | Admitting: Physician Assistant

## 2019-09-24 DIAGNOSIS — K259 Gastric ulcer, unspecified as acute or chronic, without hemorrhage or perforation: Secondary | ICD-10-CM

## 2019-09-24 NOTE — Telephone Encounter (Signed)
Procedure date and location has been changed to Monday 10/11/2019 at [facility] per pt request

## 2019-09-24 NOTE — Telephone Encounter (Signed)
Copied from [place] [id number]. Topic: General - Other >> Sep 24, 2019  2:45 PM Yvette Rack wrote: Reason for CRM: Pt wife called and requested to speak with a nurse. Pt wife stated for the last 10 days or so pt has been having foamy/bubbly urine and she would like to discuss this with a nurse. Cb# [phone]

## 2019-09-24 NOTE — Telephone Encounter (Signed)
Pt's wife calling back.  States that pt is still having foam in his urine.  Pt would like a call back from the nurse.

## 2019-09-26 ENCOUNTER — Encounter: Payer: Self-pay | Admitting: Gastroenterology

## 2019-09-27 ENCOUNTER — Telehealth: Payer: Self-pay | Admitting: Gastroenterology

## 2019-09-27 NOTE — Telephone Encounter (Signed)
Pt wife left another vm regarding previous procedure she needs a call about this colonoscopy

## 2019-09-27 NOTE — Telephone Encounter (Signed)
From [facility] 

## 2019-09-27 NOTE — Telephone Encounter (Signed)
Full bladder with forceful urination can cause foam as well as a diet high in protein. Usually indicates need to drink more water and empty bladder before overfilled. Most recent kidney function tests at the end of September were normal.

## 2019-09-27 NOTE — Telephone Encounter (Signed)
Pt wife left vm for [place] she states she saw on Mychart that he is scheduled for a procedure  And that is the first she heard of it she states it could be in [place] and they are not going to [place] please cancel if it is [place] please call pt wife with more information about this procedure

## 2019-09-27 NOTE — Telephone Encounter (Signed)
Simona Huh, Can you give some guidance on this issue please?

## 2019-09-27 NOTE — Telephone Encounter (Signed)
Patient's wife advised. She says thank you for the advice. She was able to review this message through [facility] prior to me calling and was already aware these recommendations.

## 2019-09-27 NOTE — Telephone Encounter (Signed)
Patients wife has been advised that her husband is scheduled only for an EGD on December 7th at [facility].  Thanks Daxten Kovalenko

## 2019-09-29 ENCOUNTER — Encounter: Payer: Self-pay | Admitting: Gastroenterology

## 2019-10-07 ENCOUNTER — Other Ambulatory Visit: Payer: Self-pay

## 2019-10-07 ENCOUNTER — Other Ambulatory Visit
Admission: RE | Admit: 2019-10-07 | Discharge: 2019-10-07 | Disposition: A | Discharge status: Home / Self Care | Payer: Medicare Other | Source: Ambulatory Visit | Attending: Gastroenterology | Admitting: Gastroenterology

## 2019-10-07 DIAGNOSIS — Z20828 Contact with and (suspected) exposure to other viral communicable diseases: Secondary | ICD-10-CM | POA: Diagnosis not present

## 2019-10-07 DIAGNOSIS — Z01812 Encounter for preprocedural laboratory examination: Secondary | ICD-10-CM | POA: Diagnosis present

## 2019-10-08 ENCOUNTER — Encounter: Payer: Self-pay | Admitting: *Deleted

## 2019-10-08 LAB — SARS CORONAVIRUS 2 (TAT 6-24 HRS): SARS Coronavirus 2: NEGATIVE

## 2019-10-11 ENCOUNTER — Encounter: Payer: Self-pay | Admitting: *Deleted

## 2019-10-11 ENCOUNTER — Encounter: Payer: Self-pay | Admitting: Gastroenterology

## 2019-10-11 ENCOUNTER — Ambulatory Visit: Payer: Medicare Other | Admitting: Certified Registered Nurse Anesthetist

## 2019-10-11 ENCOUNTER — Encounter
Admission: RE | Disposition: A | Discharge status: Home / Self Care | Payer: Self-pay | Source: Home / Self Care | Attending: Gastroenterology

## 2019-10-11 ENCOUNTER — Ambulatory Visit
Admission: RE | Admit: 2019-10-11 | Discharge: 2019-10-11 | Disposition: A | Discharge status: Home / Self Care | Payer: Medicare Other | Attending: Gastroenterology | Admitting: Gastroenterology

## 2019-10-11 DIAGNOSIS — K219 Gastro-esophageal reflux disease without esophagitis: Secondary | ICD-10-CM | POA: Insufficient documentation

## 2019-10-11 DIAGNOSIS — Z8719 Personal history of other diseases of the digestive system: Secondary | ICD-10-CM | POA: Diagnosis not present

## 2019-10-11 DIAGNOSIS — Z87891 Personal history of nicotine dependence: Secondary | ICD-10-CM | POA: Insufficient documentation

## 2019-10-11 DIAGNOSIS — Z7982 Long term (current) use of aspirin: Secondary | ICD-10-CM | POA: Insufficient documentation

## 2019-10-11 DIAGNOSIS — Z7984 Long term (current) use of oral hypoglycemic drugs: Secondary | ICD-10-CM | POA: Insufficient documentation

## 2019-10-11 DIAGNOSIS — E1151 Type 2 diabetes mellitus with diabetic peripheral angiopathy without gangrene: Secondary | ICD-10-CM | POA: Insufficient documentation

## 2019-10-11 DIAGNOSIS — Z7902 Long term (current) use of antithrombotics/antiplatelets: Secondary | ICD-10-CM | POA: Diagnosis not present

## 2019-10-11 DIAGNOSIS — Z09 Encounter for follow-up examination after completed treatment for conditions other than malignant neoplasm: Secondary | ICD-10-CM | POA: Insufficient documentation

## 2019-10-11 DIAGNOSIS — D631 Anemia in chronic kidney disease: Secondary | ICD-10-CM | POA: Diagnosis not present

## 2019-10-11 DIAGNOSIS — I110 Hypertensive heart disease with heart failure: Secondary | ICD-10-CM | POA: Insufficient documentation

## 2019-10-11 DIAGNOSIS — K5289 Other specified noninfective gastroenteritis and colitis: Secondary | ICD-10-CM | POA: Diagnosis not present

## 2019-10-11 DIAGNOSIS — I252 Old myocardial infarction: Secondary | ICD-10-CM | POA: Insufficient documentation

## 2019-10-11 DIAGNOSIS — Z8711 Personal history of peptic ulcer disease: Secondary | ICD-10-CM | POA: Insufficient documentation

## 2019-10-11 DIAGNOSIS — Z79899 Other long term (current) drug therapy: Secondary | ICD-10-CM | POA: Insufficient documentation

## 2019-10-11 DIAGNOSIS — K259 Gastric ulcer, unspecified as acute or chronic, without hemorrhage or perforation: Secondary | ICD-10-CM

## 2019-10-11 DIAGNOSIS — I5020 Unspecified systolic (congestive) heart failure: Secondary | ICD-10-CM | POA: Insufficient documentation

## 2019-10-11 DIAGNOSIS — I2510 Atherosclerotic heart disease of native coronary artery without angina pectoris: Secondary | ICD-10-CM | POA: Insufficient documentation

## 2019-10-11 HISTORY — PX: ESOPHAGOGASTRODUODENOSCOPY (EGD) WITH PROPOFOL: SHX5813

## 2019-10-11 SURGERY — ESOPHAGOGASTRODUODENOSCOPY (EGD) WITH PROPOFOL
Anesthesia: General

## 2019-10-11 MED ORDER — PROPOFOL 500 MG/50ML IV EMUL
INTRAVENOUS | Status: DC | PRN
  Administered 2019-10-11: 100 ug/kg/min via INTRAVENOUS

## 2019-10-11 MED ORDER — EPHEDRINE SULFATE 50 MG/ML IJ SOLN
INTRAMUSCULAR | Status: DC | PRN
  Administered 2019-10-11: 5 mg via INTRAVENOUS

## 2019-10-11 MED ORDER — OMEPRAZOLE 40 MG PO CPDR: 40.0000 mg | DELAYED_RELEASE_CAPSULE | Freq: Every day | ORAL | 2 refills | Status: AC

## 2019-10-11 MED ORDER — PROPOFOL 10 MG/ML IV BOLUS
INTRAVENOUS | Status: AC
  Filled 2019-10-11: qty 20

## 2019-10-11 MED ORDER — PROPOFOL 10 MG/ML IV BOLUS
INTRAVENOUS | Status: DC | PRN
  Administered 2019-10-11: 20 mg via INTRAVENOUS
  Administered 2019-10-11: 30 mg via INTRAVENOUS

## 2019-10-11 MED ORDER — LIDOCAINE HCL (PF) 2 % IJ SOLN
INTRAMUSCULAR | Status: AC
  Filled 2019-10-11: qty 10

## 2019-10-11 MED ORDER — EPHEDRINE SULFATE 50 MG/ML IJ SOLN
INTRAMUSCULAR | Status: AC
  Filled 2019-10-11: qty 1

## 2019-10-11 MED ORDER — SODIUM CHLORIDE (PF) 0.9 % IJ SOLN
INTRAMUSCULAR | Status: AC
  Filled 2019-10-11: qty 10

## 2019-10-11 MED ORDER — GLYCOPYRROLATE 0.2 MG/ML IJ SOLN
INTRAMUSCULAR | Status: DC | PRN
  Administered 2019-10-11: .1 mg via INTRAVENOUS

## 2019-10-11 MED ORDER — SODIUM CHLORIDE 0.9 % IV SOLN
INTRAVENOUS | Status: DC
  Administered 2019-10-11: 09:00:00 1000 mL via INTRAVENOUS

## 2019-10-11 NOTE — Op Note (Signed)
[facility] Gastroenterology Patient Name: Kevin Case Procedure Date: 10/11/2019 7:28 AM MRN: [id number] Account #: [id number] Date of Birth: 03-10-1941 Admit Type: Outpatient Age: 78 Room: [facility] ENDO ROOM 2 Gender: Male Note Status: Finalized Procedure:             Upper GI endoscopy Indications:           Follow-up of gastric ulcer with hemorrhage, h/o                         ischemic gastritis Providers:             Lin Landsman MD, MD Referring MD:          Wendee Beavers. Terrilee Croak (Referring MD) Medicines:             Monitored Anesthesia Care Complications:         No immediate complications. Estimated blood loss: None. Procedure:             Pre-Anesthesia Assessment:                        - Prior to the procedure, a History and Physical was                         performed, and patient medications and allergies were                         reviewed. The patient is competent. The risks and                         benefits of the procedure and the sedation options and                         risks were discussed with the patient. All questions                         were answered and informed consent was obtained.                         Patient identification and proposed procedure were                         verified by the physician, the nurse, the                         anesthesiologist, the anesthetist and the technician                         in the pre-procedure area in the procedure room in the                         endoscopy suite. Mental Status Examination: alert and                         oriented. Airway Examination: normal oropharyngeal                         airway and neck mobility. Respiratory Examination:  clear to auscultation. CV Examination: normal.                         Prophylactic Antibiotics: The patient does not require                         prophylactic antibiotics. Prior Anticoagulants: The                         patient has taken Plavix (clopidogrel), last dose was                         5 days prior to procedure. ASA Grade Assessment: III -                         A patient with severe systemic disease. After                         reviewing the risks and benefits, the patient was                         deemed in satisfactory condition to undergo the                         procedure. The anesthesia plan was to use monitored                         anesthesia care (MAC). Immediately prior to                         administration of medications, the patient was                         re-assessed for adequacy to receive sedatives. The                         heart rate, respiratory rate, oxygen saturations,                         blood pressure, adequacy of pulmonary ventilation, and                         response to care were monitored throughout the                         procedure. The physical status of the patient was                         re-assessed after the procedure.                        After obtaining informed consent, the endoscope was                         passed under direct vision. Throughout the procedure,                         the patient's blood pressure, pulse, and oxygen  saturations were monitored continuously. The Endoscope                         was introduced through the mouth, and advanced to the                         second part of duodenum. The upper GI endoscopy was                         accomplished without difficulty. The patient tolerated                         the procedure well. Findings:      A large amount of food (residue) was found in the gastric fundus and in       the gastric body.      Diffuse mildly erythematous mucosa without bleeding was found in the       entire examined stomach.      The duodenal bulb and second portion of the duodenum were normal.      The gastroesophageal junction  and examined esophagus were normal. Impression:            - A large amount of food (residue) in the stomach.                        - Erythematous mucosa in the stomach.                        - Normal duodenal bulb and second portion of the                         duodenum.                        - Normal gastroesophageal junction and esophagus.                        - No specimens collected. Recommendation:        - Discharge patient to home (with spouse).                        - Resume previous diet today.                        - Continue present medications.                        - Resume Plavix (clopidogrel) at prior dose today.                         Refer to managing physician for further adjustment of                         therapy.                        - Use a proton pump inhibitor PO BID long term. Procedure Code(s):     --- Professional ---                        [phone],  Esophagogastroduodenoscopy, flexible,                         transoral; diagnostic, including collection of                         specimen(s) by brushing or washing, when performed                         (separate procedure) CPT copyright 2019 American Medical Association. All rights reserved. The codes documented in this report are preliminary and upon coder review may  be revised to meet current compliance requirements. Dr. Ulyess Mort Lin Landsman MD, MD 10/11/2019 9:12:45 AM This report has been signed electronically. Number of Addenda: 0 Note Initiated On: 10/11/2019 7:28 AM Estimated Blood Loss:  Estimated blood loss: none.      [facility]

## 2019-10-11 NOTE — Transfer of Care (Signed)
Immediate Anesthesia Transfer of Care Note  Patient: Kevin Case  Procedure(s) Performed: ESOPHAGOGASTRODUODENOSCOPY (EGD) WITH PROPOFOL (N/A )  Patient Location: PACU  Anesthesia Type:General  Level of Consciousness: sedated  Airway & Oxygen Therapy: Patient Spontanous Breathing and Patient connected to face mask oxygen  Post-op Assessment: Report given to RN and Post -op Vital signs reviewed and stable  Post vital signs: Reviewed and stable  Last Vitals:  Vitals Value Taken Time  BP 136/56 10/11/19 0912  Temp 36.3 C 10/11/19 0912  Pulse 59 10/11/19 0913  Resp 15 10/11/19 0913  SpO2 100 % 10/11/19 0913  Vitals shown include unvalidated device data.  Last Pain:  Vitals:   10/11/19 0912  TempSrc: Temporal  PainSc: Asleep         Complications: No apparent anesthesia complications

## 2019-10-11 NOTE — Anesthesia Post-op Follow-up Note (Signed)
Anesthesia QCDR form completed.        

## 2019-10-11 NOTE — H&P (Signed)
Arlyss Repress, MD [address]  [address]  [place], [place] [address]  Main: [phone]  Fax: [phone] Pager: [phone]  Primary Care Physician:  Trey Sailors, PA-C Primary Gastroenterologist:  Dr. Arlyss Repress  Pre-Procedure History & Physical: HPI:  Kevin Case is a 78 y.o. male is here for an endoscopy.   Past Medical History:  Diagnosis Date  . Anemia   . Blood transfusion without reported diagnosis   . CAD (coronary artery disease)    a. 03/2019 Cath: LM nl, LAD 50p CA2+, 28m, D1 50ost, LCX 20p, 73m, RCA small, mild diff dzs-->Med rx. Consider PCI LCX for refractory angina.  . Carotid arterial disease (HCC)    a. 03/2019 Carotid U/S: RICA 1-39%, RECA <50%, LICA 1-39%, LECA <50%.  . CHF (congestive heart failure) (HCC)   . Clotting disorder (HCC)   . Diabetes mellitus without complication (HCC)   . GIB (gastrointestinal bleeding)    a. 03/2019 BRBPR following PV procedure req anticoagulation-->2u PRBCs.  . Heart murmur   . HFrEF (heart failure with reduced ejection fraction) (HCC)    a. 03/2019 Echo: EF 30-35%, mild conc LVH. Mildly dil LA. Mod MV annular dil. Mod AS (mean grad , Valve area 0.76).  Marland Kitchen History of tobacco abuse   . Hypertension   . Ischemic cardiomyopathy    a. 03/2019 Echo: EF 30-35%.  . Left bundle branch block   . Moderate aortic stenosis    a. 03/2019 Echo:  Mod AS (mean grad , Valve area 0.76).  Marland Kitchen PAD (peripheral artery disease) (HCC)    a. 03/2019 PV Angio: RCFA 80, RSFA 100.  . Rib fracture   . Sinus bradycardia   . Subclavian arterial stenosis (HCC)    a. 03/2019 PV Angio: RSCA 90 after origin of CCA. RCCA 30ost, LSCA 80 (PTA and covered stenting).    Past Surgical History:  Procedure Laterality Date  . AORTIC ARCH ANGIOGRAPHY N/A 03/31/2019   Procedure: AORTIC ARCH ANGIOGRAPHY;  Surgeon: Iran Ouch, MD;  [facility];  Service: Cardiovascular;  Laterality: N/A;  . CARDIAC CATHETERIZATION     . CORONARY ANGIOPLASTY    . ESOPHAGOGASTRODUODENOSCOPY (EGD) WITH PROPOFOL N/A 05/27/2019   Procedure: ESOPHAGOGASTRODUODENOSCOPY (EGD) WITH PROPOFOL;  Surgeon: Toney Reil, MD;  Location: [facility] ENDOSCOPY;  Service: Gastroenterology;  Laterality: N/A;  . LOWER EXTREMITY ANGIOGRAPHY Right 08/04/2019   Procedure: LOWER EXTREMITY ANGIOGRAPHY;  Surgeon: Annice Needy, MD;  Location: [facility];  Service: Cardiovascular;  Laterality: Right;  . PERIPHERAL VASCULAR INTERVENTION Left 03/31/2019   Procedure: PERIPHERAL VASCULAR INTERVENTION;  Surgeon: Iran Ouch, MD;  [facility];  Service: Cardiovascular;  Laterality: Left;  . RIGHT/LEFT HEART CATH AND CORONARY ANGIOGRAPHY N/A 03/15/2019   Procedure: RIGHT/LEFT HEART CATH AND CORONARY ANGIOGRAPHY;  Surgeon: Iran Ouch, MD;  Location: [facility];  Service: Cardiovascular;  Laterality: N/A;  . VISCERAL ANGIOGRAPHY N/A 05/28/2019   Procedure: VISCERAL ANGIOGRAPHY;  Surgeon: Annice Needy, MD;  Location: [facility];  Service: Cardiovascular;  Laterality: N/A;    Prior to Admission medications   Medication Sig Start Date End Date Taking? Authorizing Provider  acetaminophen (TYLENOL) 500 MG tablet Take 1,000 mg by mouth every 6 (six) hours as needed (headache.).   Yes [provider]  aspirin EC 81 MG tablet Take 1 tablet (81 mg total) by mouth daily. 08/04/19  Yes Annice Needy, MD  atorvastatin (LIPITOR) 80  MG tablet Take 1 tablet (80 mg total) by mouth at bedtime. 09/20/19  Yes Wellington Hampshire, MD  clopidogrel (PLAVIX) 75 MG tablet Take 1 tablet (75 mg total) by mouth daily. 06/03/19  Yes Gladstone Lighter, MD  cyanocobalamin 100 MCG tablet Take 100 mcg by mouth daily.   Yes [provider]  docusate sodium (COLACE) 100 MG capsule Take 100 mg by mouth daily.    Yes [provider]  ferrous sulfate 325 (65 FE) MG EC tablet Take 1 tablet (325 mg total) by mouth 2 (two)  times daily. 06/22/19 06/21/20 Yes Pollak, Adriana M, PA-C  JARDIANCE 10 MG TABS tablet Take 10 mg by mouth daily. 07/16/19  Yes [provider]  Melatonin 10 MG TABS Take 10 mg by mouth at bedtime.    Yes [provider]  Multiple Vitamin (MULTIVITAMIN WITH MINERALS) TABS tablet Take 1 tablet by mouth daily. Multivitamin for Senior 50+   Yes [provider]  Omega-3 Fatty Acids (FISH OIL) 1000 MG CAPS Take 1,000 mg by mouth daily.   Yes [provider]  omeprazole (PRILOSEC) 40 MG capsule Take 40 mg by mouth as needed.  07/05/19  Yes [provider]  oxyCODONE (OXY IR/ROXICODONE) 5 MG immediate release tablet oxycodone 5 mg tablet  Take 1 tablet every 6 hours by oral route.   Yes [provider]  losartan (COZAAR) 25 MG tablet Take 0.5 tablets (12.5 mg total) by mouth daily. 06/10/19 09/08/19  Rise Mu, PA-C    Allergies as of 09/24/2019  . (No Known Allergies)    Family History  Problem Relation Age of Onset  . Congestive Heart Failure Father        had AICD placed.   . Dementia Father   . Dementia Mother     Social History   Socioeconomic History  . Marital status: Married    Spouse name: Kevin Case  . Number of children: 2  . Years of education: Not on file  . Highest education level: Not on file  Occupational History  . Not on file  Social Needs  . Financial resource strain: Not very hard  . Food insecurity    Worry: Never true    Inability: Never true  . Transportation needs    Medical: No    Non-medical: No  Tobacco Use  . Smoking status: Former Smoker    Packs/day: 1.50    Years: 57.00    Pack years: 85.50    Types: Cigarettes    Quit date: 2012    Years since quitting: 8.9  . Smokeless tobacco: Never Used  Substance and Sexual Activity  . Alcohol use: No    Frequency: Never  . Drug use: Never  . Sexual activity: Not on file  Lifestyle  . Physical activity    Days per week: Not on file    Minutes per  session: Not on file  . Stress: Only a little  Relationships  . Social connections    Talks on phone: More than three times a week    Gets together: Not on file    Attends religious service: Not on file    Active member of club or organization: Not on file    Attends meetings of clubs or organizations: Not on file    Relationship status: Married  . Intimate partner violence    Fear of current or ex partner: No    Emotionally abused: No    Physically abused: No  Forced sexual activity: No  Other Topics Concern  . Not on file  Social History Narrative  . Not on file    Review of Systems: See HPI, otherwise negative ROS  Physical Exam: BP (!) 141/77   Pulse 95   Temp (!) 96.6 F (35.9 C) (Temporal)   Resp 18   Ht 5\' 9"  (1.753 m)   Wt 70.3 kg   SpO2 96%   BMI 22.89 kg/m  General:   Alert,  pleasant and cooperative in NAD Head:  Normocephalic and atraumatic. Neck:  Supple; no masses or thyromegaly. Lungs:  Clear throughout to auscultation.    Heart:  Regular rate and rhythm. Abdomen:  Soft, nontender and nondistended. Normal bowel sounds, without guarding, and without rebound.   Neurologic:  Alert and  oriented x4;  grossly normal neurologically.  Impression/Plan: is here for an endoscopy to be performed for h/o ischemic gastritis and GI bleed  Risks, benefits, limitations, and alternatives regarding  endoscopy have been reviewed with the patient.  Questions have been answered.  All parties agreeable.   Kevin Spruce, MD  10/11/2019, 8:54 AM

## 2019-10-11 NOTE — Anesthesia Postprocedure Evaluation (Signed)
Anesthesia Post Note  Patient: Kevin Case  Procedure(s) Performed: ESOPHAGOGASTRODUODENOSCOPY (EGD) WITH PROPOFOL (N/A )  Patient location during evaluation: PACU Anesthesia Type: General Level of consciousness: awake and alert Pain management: pain level controlled Vital Signs Assessment: post-procedure vital signs reviewed and stable Respiratory status: spontaneous breathing, nonlabored ventilation and respiratory function stable Cardiovascular status: blood pressure returned to baseline and stable Postop Assessment: no apparent nausea or vomiting Anesthetic complications: no     Last Vitals:  Vitals:   10/11/19 0912 10/11/19 0922  BP: (!) 136/56 139/60  Pulse: 65 64  Resp: 18 15  Temp: (!) 36.3 C   SpO2: 100% 99%    Last Pain:  Vitals:   10/11/19 0922  TempSrc:   PainSc: 0-No pain                 Durenda Hurt

## 2019-10-11 NOTE — Anesthesia Preprocedure Evaluation (Addendum)
Anesthesia Evaluation  Patient identified by MRN, date of birth, ID band Patient awake    Reviewed: Allergy & Precautions, H&P , NPO status , Patient's Chart, lab work & pertinent test results  Airway Mallampati: II  TM Distance: >3 FB     Dental  (+) Upper Dentures, Lower Dentures   Pulmonary former smoker,           Cardiovascular Exercise Tolerance: Good hypertension, + CAD, + Past MI, + Peripheral Vascular Disease and +CHF (ischemic cardiomyopathy, most recent EF 35%)  + dysrhythmias (h/o sinus brady) + Valvular Problems/Murmurs (mild AS) AS      Neuro/Psych negative neurological ROS  negative psych ROS   GI/Hepatic Neg liver ROS, PUD, GERD  Controlled,  Endo/Other  diabetesHypothyroidism   Renal/GU Renal disease (CRI)  negative genitourinary   Musculoskeletal   Abdominal   Peds  Hematology negative hematology ROS (+)   Anesthesia Other Findings Past Medical History: No date: Anemia No date: Blood transfusion without reported diagnosis No date: CAD (coronary artery disease)     Comment:  a. 03/2019 Cath: LM nl, LAD 50p CA2+, 27m, D1 50ost, LCX               20p, 80m, RCA small, mild diff dzs-->Med rx. Consider PCI              LCX for refractory angina. No date: Carotid arterial disease (HCC)     Comment:  a. 03/2019 Carotid U/S: RICA 1-39%, RECA <50%, LICA               1-39%, LECA <50%. No date: CHF (congestive heart failure) (HCC) No date: Clotting disorder (HCC) No date: Diabetes mellitus without complication (HCC) No date: GIB (gastrointestinal bleeding)     Comment:  a. 03/2019 BRBPR following PV procedure req               anticoagulation-->2u PRBCs. No date: Heart murmur No date: HFrEF (heart failure with reduced ejection fraction) (HCC)     Comment:  a. 03/2019 Echo: EF 30-35%, mild conc LVH. Mildly dil LA.              Mod MV annular dil. Mod AS (mean grad , Valve area                0.76). No date: History of tobacco abuse No date: Hypertension No date: Ischemic cardiomyopathy     Comment:  a. 03/2019 Echo: EF 30-35%. No date: Left bundle branch block No date: Moderate aortic stenosis     Comment:  a. 03/2019 Echo:  Mod AS (mean grad , Valve area               0.76). No date: PAD (peripheral artery disease) (HCC)     Comment:  a. 03/2019 PV Angio: RCFA 80, RSFA 100. No date: Rib fracture No date: Sinus bradycardia No date: Subclavian arterial stenosis (HCC)     Comment:  a. 03/2019 PV Angio: RSCA 90 after origin of CCA. RCCA               30ost, LSCA 80 (PTA and covered stenting).  Past Surgical History: 03/31/2019: AORTIC ARCH ANGIOGRAPHY; N/A     Comment:  Procedure: AORTIC ARCH ANGIOGRAPHY;  Surgeon: Iran Ouch, MD;  [facility]: [facility];  Service:  Cardiovascular;  Laterality: N/A; No date: CARDIAC CATHETERIZATION No date: CORONARY ANGIOPLASTY 05/27/2019: ESOPHAGOGASTRODUODENOSCOPY (EGD) WITH PROPOFOL; N/A     Comment:  Procedure: ESOPHAGOGASTRODUODENOSCOPY (EGD) WITH               PROPOFOL;  Surgeon: Lin Landsman, MD;  Location:               [facility] ENDOSCOPY;  Service: Gastroenterology;  Laterality:               N/A; 08/04/2019: LOWER EXTREMITY ANGIOGRAPHY; Right     Comment:  Procedure: LOWER EXTREMITY ANGIOGRAPHY;  Surgeon: Algernon Huxley, MD;  Location: [place] [facility];  Service:               Cardiovascular;  Laterality: Right; 03/31/2019: PERIPHERAL VASCULAR INTERVENTION; Left     Comment:  Procedure: PERIPHERAL VASCULAR INTERVENTION;  Surgeon:               Wellington Hampshire, MD;  Location: [place] [facility];                Service: Cardiovascular;  Laterality: Left; 03/15/2019: RIGHT/LEFT HEART CATH AND CORONARY ANGIOGRAPHY; N/A     Comment:  Procedure: RIGHT/LEFT HEART CATH AND CORONARY               ANGIOGRAPHY;  Surgeon: Wellington Hampshire, MD;  Location:               [place] [facility];  Service: Cardiovascular;                Laterality: N/A; 05/28/2019: VISCERAL ANGIOGRAPHY; N/A     Comment:  Procedure: VISCERAL ANGIOGRAPHY;  Surgeon: Algernon Huxley,              MD;  Location: [place] [facility];  Service:               Cardiovascular;  Laterality: N/A;     Reproductive/Obstetrics negative OB ROS                           Anesthesia Physical Anesthesia Plan  ASA: III  Anesthesia Plan: General   Post-op Pain Management:    Induction:   PONV Risk Score and Plan: Propofol infusion and TIVA  Airway Management Planned: Natural Airway and Nasal Cannula  Additional Equipment:   Intra-op Plan:   Post-operative Plan:   Informed Consent: I have reviewed the patients History and Physical, chart, labs and discussed the procedure including the risks, benefits and alternatives for the proposed anesthesia with the patient or authorized representative who has indicated his/her understanding and acceptance.     Dental Advisory Given  Plan Discussed with: Anesthesiologist  Anesthesia Plan Comments:         Anesthesia Quick Evaluation

## 2019-10-12 ENCOUNTER — Encounter: Payer: Self-pay | Admitting: Gastroenterology

## 2019-10-15 ENCOUNTER — Encounter: Payer: Self-pay | Admitting: Cardiovascular Disease

## 2019-10-15 ENCOUNTER — Other Ambulatory Visit: Payer: Self-pay

## 2019-10-15 ENCOUNTER — Ambulatory Visit (INDEPENDENT_AMBULATORY_CARE_PROVIDER_SITE_OTHER): Payer: Medicare Other | Admitting: Cardiovascular Disease

## 2019-10-15 VITALS — BP 110/55 | HR 63 | Ht 69.0 in | Wt 158.0 lb

## 2019-10-15 DIAGNOSIS — I739 Peripheral vascular disease, unspecified: Secondary | ICD-10-CM | POA: Diagnosis not present

## 2019-10-15 DIAGNOSIS — I359 Nonrheumatic aortic valve disorder, unspecified: Secondary | ICD-10-CM | POA: Diagnosis not present

## 2019-10-15 DIAGNOSIS — I5022 Chronic systolic (congestive) heart failure: Secondary | ICD-10-CM | POA: Diagnosis not present

## 2019-10-15 DIAGNOSIS — E785 Hyperlipidemia, unspecified: Secondary | ICD-10-CM

## 2019-10-15 DIAGNOSIS — I10 Essential (primary) hypertension: Secondary | ICD-10-CM | POA: Diagnosis not present

## 2019-10-15 NOTE — Progress Notes (Signed)
Cardiology Office Note   Date:  10/15/2019   ID:  Kevin Case, DOB [birth year], MRN [id number]  PCP:  Trey Sailors, PA-C  Cardiologist:   Lorine Bears, MD   Chief Complaint  Patient presents with  . other    3 month f/u c/o weakness and edema right leg. Meds reviewed verbally with pt.      History of Present Illness: Kevin Case is a 78 y.o. male who presents for a follow-up visit regarding chronic systolic heart failure, coronary artery disease, aortic stenosis and extensive peripheral arterial disease.  He has history of sinus bradycardia, left bundle branch block, essential hypertension and hyperlipidemia. He was hospitalized at [facility] in May of this year with non-ST elevation myocardial infarction and acute systolic heart failure.  An echocardiogram was performed which showed an EF of 30 to 35%, mild to moderate aortic stenosis and mildly dilated left atrium.  The patient was noted to have very diminished pulses in his upper extremities.  Troponin was mildly elevated and peaked at 3.5. He underwent a right and left cardiac catheterization which showed left dominant coronary arteries with heavily calcified vessels and 80% stenosis in the mid left circumflex.  Right heart catheterization showed normal filling pressures, normal pulmonary pressure and normal cardiac output.  Aortic stenosis was mild with a peak gradient of 10 mmHg.  He was found to have significant heavily calcified proximal left subclavian artery stenosis and heavy calcification in the innominate artery.  There was 60 mm systolic gradient between the right arm and central aortic pressure and 40 mm of systolic gradient between left arm and central pressure.  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 He underwent stenting of the left subclavian artery in May.  The  procedure was complicated by lower GI bleed in the setting of anticoagulation. He was subsequently hospitalized at [facility] in July with recurrent upper GI bleed associated with hematemesis and complicated by aspiration pneumonia.  Subsequent EGD was suggestive of ischemic gastritis.  The patient was readmitted and had further bleeding requiring vasopressor support.  He underwent visceral angiography which showed occluded SMA and celiac artery stenosis.  He underwent stenting of the SMA. He had lower extremity angiography by Dr. Wyn Quaker in September with subsequent angioplasty of the right TP trunk, peroneal artery, SFA and popliteal arteries.  He ended up placing 2 covered stent placement to the SFA and popliteal artery.  Other than increased swelling in the right leg, the patient has not noticed any difference in his symptoms.  He has known PAD on the left side but again the patient denies any pain or ulceration.  He has generalized weakness and his balance is not great. He denies chest pain or shortness of breath.  No arm claudication.   Past Medical History:  Diagnosis Date  . Anemia   . Blood transfusion without reported diagnosis   . CAD (coronary artery disease)    a. 03/2019 Cath: LM nl, LAD 50p CA2+, 34m, D1 50ost, LCX 20p, 46m, RCA small, mild diff dzs-->Med rx. Consider PCI LCX for refractory angina.  . Carotid arterial disease (HCC)    a. 03/2019 Carotid U/S: RICA 1-39%, RECA <50%, LICA 1-39%, LECA <50%.  . CHF (congestive heart failure) (HCC)   . Clotting disorder (HCC)   . Diabetes mellitus without complication (HCC)   . GIB (  gastrointestinal bleeding)    a. 03/2019 BRBPR following PV procedure req anticoagulation-->2u PRBCs.  . Heart murmur   . HFrEF (heart failure with reduced ejection fraction) (HCC)    a. 03/2019 Echo: EF 30-35%, mild conc LVH. Mildly dil LA. Mod MV annular dil. Mod AS (mean grad , Valve area 0.76).  Marland Kitchen History of tobacco abuse   . Hypertension   . Ischemic  cardiomyopathy    a. 03/2019 Echo: EF 30-35%.  . Left bundle branch block   . Moderate aortic stenosis    a. 03/2019 Echo:  Mod AS (mean grad , Valve area 0.76).  Marland Kitchen PAD (peripheral artery disease) (HCC)    a. 03/2019 PV Angio: RCFA 80, RSFA 100.  . Rib fracture   . Sinus bradycardia   . Subclavian arterial stenosis (HCC)    a. 03/2019 PV Angio: RSCA 90 after origin of CCA. RCCA 30ost, LSCA 80 (PTA and covered stenting).    Past Surgical History:  Procedure Laterality Date  . AORTIC ARCH ANGIOGRAPHY N/A 03/31/2019   Procedure: AORTIC ARCH ANGIOGRAPHY;  Surgeon: Iran Ouch, MD;  [facility];  Service: Cardiovascular;  Laterality: N/A;  . CARDIAC CATHETERIZATION    . CORONARY ANGIOPLASTY    . ESOPHAGOGASTRODUODENOSCOPY (EGD) WITH PROPOFOL N/A 05/27/2019   Procedure: ESOPHAGOGASTRODUODENOSCOPY (EGD) WITH PROPOFOL;  Surgeon: Toney Reil, MD;  Location: [facility] ENDOSCOPY;  Service: Gastroenterology;  Laterality: N/A;  . ESOPHAGOGASTRODUODENOSCOPY (EGD) WITH PROPOFOL N/A 10/11/2019   Procedure: ESOPHAGOGASTRODUODENOSCOPY (EGD) WITH PROPOFOL;  Surgeon: Toney Reil, MD;  Location: [facility] ENDOSCOPY;  Service: Gastroenterology;  Laterality: N/A;  . LOWER EXTREMITY ANGIOGRAPHY Right 08/04/2019   Procedure: LOWER EXTREMITY ANGIOGRAPHY;  Surgeon: Annice Needy, MD;  Location: [facility];  Service: Cardiovascular;  Laterality: Right;  . PERIPHERAL VASCULAR INTERVENTION Left 03/31/2019   Procedure: PERIPHERAL VASCULAR INTERVENTION;  Surgeon: Iran Ouch, MD;  [facility];  Service: Cardiovascular;  Laterality: Left;  . RIGHT/LEFT HEART CATH AND CORONARY ANGIOGRAPHY N/A 03/15/2019   Procedure: RIGHT/LEFT HEART CATH AND CORONARY ANGIOGRAPHY;  Surgeon: Iran Ouch, MD;  Location: [facility];  Service: Cardiovascular;  Laterality: N/A;  . VISCERAL ANGIOGRAPHY N/A 05/28/2019   Procedure: VISCERAL ANGIOGRAPHY;  Surgeon: Annice Needy, MD;  Location: [facility];  Service: Cardiovascular;  Laterality: N/A;     Current Outpatient Medications  Medication Sig Dispense Refill  . acetaminophen (TYLENOL) 500 MG tablet Take 1,000 mg by mouth every 6 (six) hours as needed (headache.).    Marland Kitchen aspirin EC 81 MG tablet Take 1 tablet (81 mg total) by mouth daily. 150 tablet 2  . atorvastatin (LIPITOR) 80 MG tablet Take 1 tablet (80 mg total) by mouth at bedtime. 30 tablet 0  . clopidogrel (PLAVIX) 75 MG tablet Take 1 tablet (75 mg total) by mouth daily. 30 tablet 2  . docusate sodium (COLACE) 100 MG capsule Take 100 mg by mouth daily.     Marland Kitchen JARDIANCE 10 MG TABS tablet Take 10 mg by mouth daily.    Marland Kitchen losartan (COZAAR) 25 MG tablet Take 0.5 tablets (12.5 mg total) by mouth daily. 45 tablet 3  . Melatonin 10 MG TABS Take 10 mg by mouth at bedtime.     . Multiple Vitamin (MULTIVITAMIN WITH MINERALS) TABS tablet Take 1 tablet by mouth daily. Multivitamin for Senior 50+    . Omega-3 Fatty Acids (FISH OIL) 1000 MG CAPS Take 1,000 mg by mouth daily.    Marland Kitchen  omeprazole (PRILOSEC) 40 MG capsule Take 1 capsule (40 mg total) by mouth daily. 90 capsule 2   No current facility-administered medications for this visit.    Allergies:   Patient has no known allergies.    Social History:  The patient  reports that he quit smoking about 8 years ago. His smoking use included cigarettes. He has a 85.50 pack-year smoking history. He has never used smokeless tobacco. He reports that he does not drink alcohol or use drugs.   Family History:  The patient's family history includes Congestive Heart Failure in his father; Dementia in his father and mother.    ROS:  Please see the history of present illness.   Otherwise, review of systems are positive for none.   All other systems are reviewed and negative.    PHYSICAL EXAM: VS:  BP (!) 110/55 (BP Location: Left Arm, Patient Position: Sitting, Cuff Size: Normal)   Pulse 63   Ht 5\' 9"  (1.753 m)    Wt 158 lb (71.7 kg)   SpO2 98%   BMI 23.33 kg/m  , BMI Body mass index is 23.33 kg/m. GEN: Well nourished, well developed, in no acute distress  HEENT: normal  Neck: no JVD, bilateral carotid bruits, or masses.  Bilateral bruits in the subclavian area Cardiac: RRR; no  rubs, or gallops,no edema .  2 /6 systolic murmur in the aortic area Respiratory:  clear to auscultation bilaterally, normal work of breathing GI: soft, nontender, nondistended, + BS MS: no deformity or atrophy  Skin: warm and dry, no rash Neuro:  Strength and sensation are intact Psych: euthymic mood, full affect.     EKG:  EKG is ordered today. The ekg ordered today demonstrates sinus bradycardia with left bundle branch block.  Heart rate is 63 bpm.   Recent Labs: 03/12/2019: B Natriuretic Peptide 864.0 05/27/2019: TSH 6.226 05/31/2019: Magnesium 2.4 06/10/2019: ALT 45 06/17/2019: Potassium 4.0; Sodium 138 08/04/2019: BUN 29; Creatinine, Ser 1.07 09/22/2019: Hemoglobin 14.1; Platelets 171    Lipid Panel    Component Value Date/Time   CHOL 108 06/10/2019 1130   TRIG 124 06/10/2019 1130   HDL 33 (L) 06/10/2019 1130   CHOLHDL 3.3 06/10/2019 1130   LDLCALC 50 06/10/2019 1130   LDLDIRECT 50 06/10/2019 1127      Wt Readings from Last 3 Encounters:  10/15/19 158 lb (71.7 kg)  10/11/19 155 lb (70.3 kg)  09/22/19 158 lb 9.6 oz (71.9 kg)       PAD Screen 03/25/2019  Previous PAD dx? No  Previous surgical procedure? No  Pain with walking? Yes  Subsides with rest? Yes  Feet/toe relief with dangling? No  Painful, non-healing ulcers? No  Extremities discolored? No      ASSESSMENT AND PLAN:  1.  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  Degree of cardiomyopathy is out of proportion to coronary artery disease.  He is currently on low-dose losartan.  Not able to increase due to relatively low blood pressure.  He is not on a beta-blocker due to chronic  bradycardia.  He appears to be euvolemic without diuretics.  The patient might benefit from CRT if ejection fraction remains low.  However, I am concerned about his multiple comorbidities. Repeat echocardiogram to evaluate EF  2.  Coronary artery disease involving native coronary arteries without angina:  Cardiac catheterization showed 80% stenosis in the mid left circumflex which was heavily calcified. Currently has no convincing symptoms of  angina.  Recommend continue with medical therapy.  3.  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  His blood pressure should always be checked in the left arm which is more accurate.  He has a strong left brachial pulse.   4.  Moderate aortic stenosis: I requested a repeat echocardiogram.  5.  Hyperlipidemia: Continue high-dose atorvastatin with a target LDL of less than 70.  Recent lipid profile showed an LDL of 50.  6.  Peripheral arterial disease: Status post revascularization on the right side by Dr. Lucky Cowboy.  He has known disease on the left side but again the patient denies symptoms.   Disposition:   FU with me in 3 month  Signed,  Kathlyn Sacramento, MD  10/15/2019 11:40 AM    [place]

## 2019-10-15 NOTE — Patient Instructions (Signed)
Medication Instructions:  Your physician recommends that you continue on your current medications as directed. Please refer to the Current Medication list given to you today.  *If you need a refill on your cardiac medications before your next appointment, please call your pharmacy*  Lab Work: None ordered If you have labs (blood work) drawn today and your tests are completely normal, you will receive your results only by: Marland Kitchen MyChart Message (if you have MyChart) OR . A paper copy in the mail If you have any lab test that is abnormal or we need to change your treatment, we will call you to review the results.  Testing/Procedures: Your physician has requested that you have an echocardiogram. Echocardiography is a painless test that uses sound waves to create images of your heart. It provides your doctor with information about the size and shape of your heart and how well your heart's chambers and valves are working. This procedure takes approximately one hour. There are no restrictions for this procedure.    Follow-Up: At [facility], you and your health needs are our priority.  As part of our continuing mission to provide you with exceptional heart care, we have created designated Provider Care Teams.  These Care Teams include your primary Cardiologist (physician) and Advanced Practice Providers (APPs -  Physician Assistants and Nurse Practitioners) who all work together to provide you with the care you need, when you need it.  Your next appointment:   4 month(s)  The format for your next appointment:   In Person  Provider:    You may see Kathlyn Sacramento, MD or one of the following Advanced Practice Providers on your designated Care Team:    Murray Hodgkins, NP  Christell Faith, PA-C  Marrianne Mood, PA-C   Other Instructions N/A

## 2019-10-18 ENCOUNTER — Other Ambulatory Visit: Payer: Self-pay | Admitting: Cardiovascular Disease

## 2019-10-21 ENCOUNTER — Ambulatory Visit: Admit: 2019-10-21 | Payer: Medicare Other | Admitting: Gastroenterology

## 2019-10-21 SURGERY — ESOPHAGOGASTRODUODENOSCOPY (EGD) WITH PROPOFOL
Anesthesia: Choice

## 2019-10-23 ENCOUNTER — Encounter (INDEPENDENT_AMBULATORY_CARE_PROVIDER_SITE_OTHER): Payer: Self-pay

## 2019-10-24 ENCOUNTER — Encounter (INDEPENDENT_AMBULATORY_CARE_PROVIDER_SITE_OTHER): Payer: Self-pay

## 2019-10-25 ENCOUNTER — Ambulatory Visit (INDEPENDENT_AMBULATORY_CARE_PROVIDER_SITE_OTHER): Payer: Medicare Other | Admitting: Nurse Practitioner

## 2019-10-25 ENCOUNTER — Encounter (INDEPENDENT_AMBULATORY_CARE_PROVIDER_SITE_OTHER): Payer: Self-pay | Admitting: Nurse Practitioner

## 2019-10-25 ENCOUNTER — Other Ambulatory Visit: Payer: Self-pay

## 2019-10-25 ENCOUNTER — Ambulatory Visit (INDEPENDENT_AMBULATORY_CARE_PROVIDER_SITE_OTHER): Payer: Medicare Other

## 2019-10-25 VITALS — BP 81/50 | HR 63 | Resp 17 | Ht 69.0 in | Wt 158.0 lb

## 2019-10-25 DIAGNOSIS — I70219 Atherosclerosis of native arteries of extremities with intermittent claudication, unspecified extremity: Secondary | ICD-10-CM | POA: Diagnosis not present

## 2019-10-25 DIAGNOSIS — M7989 Other specified soft tissue disorders: Secondary | ICD-10-CM | POA: Diagnosis not present

## 2019-10-25 DIAGNOSIS — M1990 Unspecified osteoarthritis, unspecified site: Secondary | ICD-10-CM | POA: Diagnosis not present

## 2019-10-25 MED ORDER — TRAMADOL HCL 50 MG PO TABS: 50.0000 mg | ORAL_TABLET | Freq: Three times a day (TID) | ORAL | 0 refills | Status: DC | PRN

## 2019-10-25 NOTE — Telephone Encounter (Signed)
Let's get the patient in for ABIs.  If he's willing to come in today that would be ok.  If he's in this much pain it would be wise to assess him before we prescribe any pain medication.

## 2019-10-30 ENCOUNTER — Encounter: Payer: Self-pay | Admitting: Physician Assistant

## 2019-10-31 ENCOUNTER — Encounter (INDEPENDENT_AMBULATORY_CARE_PROVIDER_SITE_OTHER): Payer: Self-pay | Admitting: Nurse Practitioner

## 2019-10-31 DIAGNOSIS — M1990 Unspecified osteoarthritis, unspecified site: Secondary | ICD-10-CM | POA: Insufficient documentation

## 2019-10-31 NOTE — Progress Notes (Signed)
SUBJECTIVE:  Patient ID: Kevin Case, male    DOB: 12/25/1940, 78 y.o.   MRN: [id number] Chief Complaint  Patient presents with  . Follow-up    BLE leg pain and swelling    HPI  Kevin Case is a 78 y.o. male that presents to our office after an urgent call from his wife describing lower extremity edema and pain.  The patient's wife is concerned due to the fact that the patient is unable to walk very well.  The patient is not always a very clear historian however it seems apparent that the weakness is originating in his knees.  As the patient describes a weakness and shaking in his knees when he tries to ambulate.  He also describes pain in his feet that has been worsened with the edema.  Today the patient has extensive edema however the patient's wife states that it was much worse previously.  She also reports giving him Lasix despite it being stopped by his PCP/cardiologist to help with his lower extremity edema.  Given the patient's low blood pressure this too could contribute to his weakness.  He denies any fever, chills, diarrhea or nausea.  Given his extensive history of peripheral arterial disease it was felt that it was prudent to ensure there were no drastic changes in his arterial status.  The patient's right ABI is 1.39 with a TBI 0.40.  The left is 0.66 with a TBI 0.66.  Previously on 08/31/2019 the right ABI was 1.05 with a left of 0.51.  The patient has strong monophasic waveforms bilaterally with strong toe waveforms however the left digit waveform is dampened.  This is consistent with the previous studies done on 08/31/2019.  Past Medical History:  Diagnosis Date  . Anemia   . Blood transfusion without reported diagnosis   . CAD (coronary artery disease)    a. 03/2019 Cath: LM nl, LAD 50p CA2+, 14m, D1 50ost, LCX 20p, 4m, RCA small, mild diff dzs-->Med rx. Consider PCI LCX for refractory angina.  . Carotid arterial disease ([place])    a. 03/2019 Carotid U/S: RICA 8-31%,  RECA <51%, LICA 7-61%, LECA <60%.  . CHF (congestive heart failure) ([place])   . Clotting disorder ([place])   . Diabetes mellitus without complication ([place])   . GIB (gastrointestinal bleeding)    a. 03/2019 BRBPR following PV procedure req anticoagulation-->2u PRBCs.  . Heart murmur   . HFrEF (heart failure with reduced ejection fraction) ([place])    a. 03/2019 Echo: EF 30-35%, mild conc LVH. Mildly dil LA. Mod MV annular dil. Mod AS (mean grad 48mmHg, Valve area 0.76).  Marland Kitchen History of tobacco abuse   . Hypertension   . Ischemic cardiomyopathy    a. 03/2019 Echo: EF 30-35%.  . Left bundle branch block   . Moderate aortic stenosis    a. 03/2019 Echo:  Mod AS (mean grad 17mmHg, Valve area 0.76).  Marland Kitchen PAD (peripheral artery disease) ([place])    a. 03/2019 PV Angio: RCFA 80, RSFA 100.  . Rib fracture   . Sinus bradycardia   . 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 03/2019 PV Angio: RSCA 90 after origin of CCA. RCCA 30ost, LSCA 80 (PTA and covered stenting).    Past Surgical History:  Procedure Laterality Date  . AORTIC ARCH ANGIOGRAPHY N/A 03/31/2019   Procedure: AORTIC ARCH ANGIOGRAPHY;  Surgeon: Wellington Hampshire, MD;  Location: [place] CV LAB;  Service: Cardiovascular;  Laterality: N/A;  . CARDIAC CATHETERIZATION    .  CORONARY ANGIOPLASTY    . ESOPHAGOGASTRODUODENOSCOPY (EGD) WITH PROPOFOL N/A 05/27/2019   Procedure: ESOPHAGOGASTRODUODENOSCOPY (EGD) WITH PROPOFOL;  Surgeon: Toney Reil, MD;  Location[facility] ENDOSCOPY;  Service: Gastroenterology;  Laterality: N/A;  . ESOPHAGOGASTRODUODENOSCOPY (EGD) WITH PROPOFOL N/A 10/11/2019   Procedure: ESOPHAGOGASTRODUODENOSCOPY (EGD) WITH PROPOFOL;  Surgeon: Toney Reil, MD;  Location: [facility] ENDOSCOPY;  Service: Gastroenterology;  Laterality: N/A;  . LOWER EXTREMITY ANGIOGRAPHY Right 08/04/2019   Procedure: LOWER EXTREMITY ANGIOGRAPHY;  Surgeon: Annice Needy, MD;  Location: [facility] INVASIVE CV LAB;  Service: Cardiovascular;  Laterality: Right;  .  PERIPHERAL VASCULAR INTERVENTION Left 03/31/2019   Procedure: PERIPHERAL VASCULAR INTERVENTION;  Surgeon: Iran Ouch, MD;  Location: [facility] INVASIVE CV LAB;  Service: Cardiovascular;  Laterality: Left;  . RIGHT/LEFT HEART CATH AND CORONARY ANGIOGRAPHY N/A 03/15/2019   Procedure: RIGHT/LEFT HEART CATH AND CORONARY ANGIOGRAPHY;  Surgeon: Iran Ouch, MD;  Location: [facility] INVASIVE CV LAB;  Service: Cardiovascular;  Laterality: N/A;  . VISCERAL ANGIOGRAPHY N/A 05/28/2019   Procedure: VISCERAL ANGIOGRAPHY;  Surgeon: Annice Needy, MD;  Location: [facility] INVASIVE CV LAB;  Service: Cardiovascular;  Laterality: N/A;    Social History   Socioeconomic History  . Marital status: Married    Spouse name: Donita  . Number of children: 2  . Years of education: Not on file  . Highest education level: Not on file  Occupational History  . Not on file  Tobacco Use  . Smoking status: Former Smoker    Packs/day: 1.50    Years: 57.00    Pack years: 85.50    Types: Cigarettes    Quit date: 2012    Years since quitting: 8.9  . Smokeless tobacco: Never Used  Substance and Sexual Activity  . Alcohol use: No  . Drug use: Never  . Sexual activity: Not on file  Other Topics Concern  . Not on file  Social History Narrative  . Not on file   Social Determinants of Health   Financial Resource Strain: Low Risk   . Difficulty of Paying Living Expenses: Not very hard  Food Insecurity: No Food Insecurity  . Worried About Programme researcher, broadcasting/film/video in the Last Year: Never true  . Ran Out of Food in the Last Year: Never true  Transportation Needs: No Transportation Needs  . Lack of Transportation (Medical): No  . Lack of Transportation (Non-Medical): No  Physical Activity:   . Days of Exercise per Week: Not on file  . Minutes of Exercise per Session: Not on file  Stress: No Stress Concern Present  . Feeling of Stress : Only a little  Social Connections: Unknown  . Frequency of Communication with Friends and  Family: More than three times a week  . Frequency of Social Gatherings with Friends and Family: Not on file  . Attends Religious Services: Not on file  . Active Member of Clubs or Organizations: Not on file  . Attends Banker Meetings: Not on file  . Marital Status: Married  Catering manager Violence: Not At Risk  . Fear of Current or Ex-Partner: No  . Emotionally Abused: No  . Physically Abused: No  . Sexually Abused: No    Family History  Problem Relation Age of Onset  . Congestive Heart Failure Father        had AICD placed.   . Dementia Father   . Dementia Mother     No Known Allergies   Review of Systems   Review of Systems:  Negative Unless Checked Constitutional: [] Weight loss  [] Fever  [] Chills Cardiac: [] Chest pain   []  Atrial Fibrillation  [] Palpitations   [] Shortness of breath when laying flat   [] Shortness of breath with exertion. [] Shortness of breath at rest Vascular:  [x] Pain in legs with walking   [] Pain in legs with standing [] Pain in legs when laying flat   [] Claudication    [] Pain in feet when laying flat    [] History of DVT   [] Phlebitis   [x] Swelling in legs   [] Varicose veins   [] Non-healing ulcers Pulmonary:   [] Uses home oxygen   [] Productive cough   [] Hemoptysis   [] Wheeze  [] COPD   [] Asthma Neurologic:  [] Dizziness   [] Seizures  [] Blackouts [] History of stroke   [] History of TIA  [] Aphasia   [] Temporary Blindness   [] Weakness or numbness in arm   [x] Weakness or numbness in leg Musculoskeletal:   [] Joint swelling   [] Joint pain   [] Low back pain  []  History of Knee Replacement [x] Arthritis [] back Surgeries  []  Spinal Stenosis    Hematologic:  [] Easy bruising  [] Easy bleeding   [] Hypercoagulable state   [x] Anemic Gastrointestinal:  [] Diarrhea   [] Vomiting  [x] Gastroesophageal reflux/heartburn   [] Difficulty swallowing. [] Abdominal pain Genitourinary:  [] Chronic kidney disease   [] Difficult urination  [] Anuric   [] Blood in urine [] Frequent  urination  [] Burning with urination   [] Hematuria Skin:  [] Rashes   [] Ulcers [] Wounds Psychological:  [] History of anxiety   []  History of major depression  []  Memory Difficulties      OBJECTIVE:   Physical Exam  BP (!) 81/50 (BP Location: Right Arm)   Pulse 63   Resp 17   Ht 5\' 9"  (1.753 m)   Wt 158 lb (71.7 kg)   BMI 23.33 kg/m   Gen: WD/WN, NAD Head: [place]/AT, No temporalis wasting.  Ear/Nose/Throat: Hearing grossly intact, nares w/o erythema or drainage Eyes: PER, EOMI, sclera nonicteric.  Neck: Supple, no masses.  No JVD.  Pulmonary:  Good air movement, no use of accessory muscles.  Cardiac: RRR Vascular: 3+ pitting edema bilaterally unable to palpate pulses due to edema Vessel Right Left  Radial Palpable Palpable   Gastrointestinal: soft, non-distended. No guarding/no peritoneal signs.  Musculoskeletal: M/S 5/5 throughout.  No deformity or atrophy.  Neurologic: Pain and light touch intact in extremities.  Symmetrical.  Speech is fluent. Motor exam as listed above. Psychiatric: Judgment intact, Mood & affect appropriate for pt's clinical situation. Dermatologic: No Venous rashes. No Ulcers Noted.  No changes consistent with cellulitis. Lymph : No Cervical lymphadenopathy, no lichenification or skin changes of chronic lymphedema.       ASSESSMENT AND PLAN:  1. Leg swelling Today we will place the patient in bilateral [place] wraps to help to control the swelling.  This will be a one-time wrap with the patient's wife will remove in 7 days.  Once those are removed hopefully she will be able to apply the patient's compression stocking to help control swelling.  The patient is also advised not to maintain a dependent position which also likely contributes to his edema and likely some of the lower extremity pain that he is having.  The patient's wife also notes that he was recently taken off of Lasix, however she recently gave him several doses in order to help with the swelling.   While the patient's history of CHF likely has contributed to his lower extremity edema is also acts as a blood pressure lowering medication and today the patient's blood  pressure is in the 80s systolic.  Advised the patient at this time not to continue to give him any more Lasix than what has been prescribed by his PCP/cardiologist.  2. Atherosclerotic peripheral vascular disease with intermittent claudication (HCC) Patient's lower extremity pain is not consistent with worsening PAD as his studies are consistent with the previous studies done on 08/31/2019.  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  3. Arthritis Small dose of tramadol given for patient continued pain and discomfort.  It is a little hard to discern from the history but this could be either due to the patient's lower extremity edema or arthritis.  This will be a one-time prescription for pain medication otherwise the patient will need to be further worked up by his PCP.   Current Outpatient Medications on File Prior to Visit  Medication Sig Dispense Refill  . acetaminophen (TYLENOL) 500 MG tablet Take 1,000 mg by mouth every 6 (six) hours as needed (headache.).    Marland Kitchen aspirin EC 81 MG tablet Take 1 tablet (81 mg total) by mouth daily. 150 tablet 2  . atorvastatin (LIPITOR) 80 MG tablet TAKE 1 TABLET BY MOUTH ONCE DAILY AT BEDTIME 30 tablet 5  . clopidogrel (PLAVIX) 75 MG tablet Take 1 tablet (75 mg total) by mouth daily. 30 tablet 2  . docusate sodium (COLACE) 100 MG capsule Take 100 mg by mouth daily.     . Melatonin 10 MG TABS Take 10 mg by mouth at bedtime.     . Multiple Vitamin (MULTIVITAMIN WITH MINERALS) TABS tablet Take 1 tablet by mouth daily. Multivitamin for Senior 50+    . Omega-3 Fatty Acids (FISH OIL) 1000 MG CAPS Take 1,000 mg by mouth daily.    Marland Kitchen omeprazole (PRILOSEC) 40 MG capsule Take 1 capsule (40 mg total) by mouth  daily. 90 capsule 2  . pantoprazole (PROTONIX) 40 MG tablet Take 40 mg by mouth 2 (two) times daily.    Marland Kitchen losartan (COZAAR) 25 MG tablet Take 0.5 tablets (12.5 mg total) by mouth daily. 45 tablet 3   No current facility-administered medications on file prior to visit.    There are no Patient Instructions on file for this visit. No follow-ups on file.   Georgiana Spinner, NP  This note was completed with Office manager.  Any errors are purely unintentional.

## 2019-11-01 ENCOUNTER — Encounter (INDEPENDENT_AMBULATORY_CARE_PROVIDER_SITE_OTHER): Payer: Medicare Other

## 2019-11-01 MED ORDER — HEPARIN SODIUM (PORCINE) 5000 UNIT/ML IJ SOLN
5000.00 | INTRAMUSCULAR | Status: DC
Start: 2019-11-01 — End: 2019-11-01

## 2019-11-01 MED ORDER — LIDOCAINE HCL 1 % IJ SOLN
0.50 | INTRAMUSCULAR | Status: DC
Start: ? — End: 2019-11-01

## 2019-11-01 MED ORDER — ATORVASTATIN CALCIUM 20 MG PO TABS
80.00 | ORAL_TABLET | ORAL | Status: DC
Start: 2019-11-01 — End: 2019-11-01

## 2019-11-01 MED ORDER — MELATONIN 3 MG PO TABS
10.00 | ORAL_TABLET | ORAL | Status: DC
Start: ? — End: 2019-11-01

## 2019-11-01 MED ORDER — OMEGA-3 1000 MG PO CAPS
1.00 | ORAL_CAPSULE | ORAL | Status: DC
Start: 2019-11-02 — End: 2019-11-01

## 2019-11-01 MED ORDER — CLOPIDOGREL BISULFATE 75 MG PO TABS
75.00 | ORAL_TABLET | ORAL | Status: DC
Start: 2019-11-02 — End: 2019-11-01

## 2019-11-01 MED ORDER — MULTI-VITAMIN PO TABS
1.00 | ORAL_TABLET | ORAL | Status: DC
Start: 2019-11-02 — End: 2019-11-01

## 2019-11-01 MED ORDER — SENNOSIDES-DOCUSATE SODIUM 8.6-50 MG PO TABS
2.00 | ORAL_TABLET | ORAL | Status: DC
Start: ? — End: 2019-11-01

## 2019-11-01 MED ORDER — ASPIRIN 81 MG PO TBEC
81.00 | DELAYED_RELEASE_TABLET | ORAL | Status: DC
Start: 2019-11-02 — End: 2019-11-01

## 2019-11-01 MED ORDER — DEXTROSE 50 % IV SOLN
12.50 | INTRAVENOUS | Status: DC
Start: ? — End: 2019-11-01

## 2019-11-01 MED ORDER — CYANOCOBALAMIN 500 MCG PO TABS
1000.00 | ORAL_TABLET | ORAL | Status: DC
Start: 2019-11-02 — End: 2019-11-01

## 2019-11-01 MED ORDER — GLUCAGON (RDNA) 1 MG IJ KIT
1.00 | PACK | INTRAMUSCULAR | Status: DC
Start: ? — End: 2019-11-01

## 2019-11-01 MED ORDER — GENERIC EXTERNAL MEDICATION
Status: DC
Start: ? — End: 2019-11-01

## 2019-11-01 MED ORDER — GENERIC EXTERNAL MEDICATION
12.50 | Status: DC
Start: 2019-11-01 — End: 2019-11-01

## 2019-11-01 MED ORDER — GENERIC EXTERNAL MEDICATION
5.00 | Status: DC
Start: 2019-11-01 — End: 2019-11-01

## 2019-11-01 MED ORDER — INSULIN LISPRO 100 UNIT/ML ~~LOC~~ SOLN
0.00 | SUBCUTANEOUS | Status: DC
Start: 2019-11-01 — End: 2019-11-01

## 2019-11-01 MED ORDER — DSS 100 MG PO CAPS
100.00 | ORAL_CAPSULE | ORAL | Status: DC
Start: 2019-11-02 — End: 2019-11-01

## 2019-11-01 MED ORDER — LOSARTAN POTASSIUM 25 MG PO TABS
25.00 | ORAL_TABLET | ORAL | Status: DC
Start: 2019-11-02 — End: 2019-11-01

## 2019-11-01 MED ORDER — PANTOPRAZOLE SODIUM 40 MG PO TBEC
40.00 | DELAYED_RELEASE_TABLET | ORAL | Status: DC
Start: 2019-11-01 — End: 2019-11-01

## 2019-11-01 MED ORDER — ACETAMINOPHEN 325 MG PO TABS
975.00 | ORAL_TABLET | ORAL | Status: DC
Start: ? — End: 2019-11-01

## 2019-11-01 NOTE — Telephone Encounter (Signed)
Can we please cancel 11/03/2019 visit? He is hospitalized, thanks.

## 2019-11-03 ENCOUNTER — Ambulatory Visit: Payer: Self-pay | Admitting: Physician Assistant

## 2019-11-08 ENCOUNTER — Encounter: Payer: Self-pay | Admitting: Physician Assistant

## 2019-11-09 ENCOUNTER — Encounter: Payer: Self-pay | Admitting: Physician Assistant

## 2019-11-09 ENCOUNTER — Telehealth: Payer: Self-pay

## 2019-11-09 NOTE — Telephone Encounter (Signed)
Can we please schedule a virtual hospital follow up visit?

## 2019-11-09 NOTE — Telephone Encounter (Signed)
I will review it at his follow up visit. I need to look at his chart and health status in entirety to make this decision.

## 2019-11-09 NOTE — Telephone Encounter (Signed)
Patient's wife [name]) scheduled patient a telephone visit on 11/18/2019 for his hospital follow-up. Donita wants to know if patient could get back on Metformin due to the Jardiance been too expensive for the patient. Please advise.

## 2019-11-10 NOTE — Telephone Encounter (Signed)
Patient's wife [name]) was advised and states that she will wait for Adriana to contact her on wat to do.

## 2019-11-14 ENCOUNTER — Emergency Department: Payer: Medicare Other

## 2019-11-14 ENCOUNTER — Emergency Department
Admission: EM | Admit: 2019-11-14 | Discharge: 2019-11-22 | Disposition: A | Discharge status: Home / Self Care | Payer: Medicare Other | Attending: Emergency Medicine | Admitting: Emergency Medicine

## 2019-11-14 ENCOUNTER — Encounter: Payer: Self-pay | Admitting: Emergency Medicine

## 2019-11-14 ENCOUNTER — Other Ambulatory Visit: Payer: Self-pay

## 2019-11-14 DIAGNOSIS — I2510 Atherosclerotic heart disease of native coronary artery without angina pectoris: Secondary | ICD-10-CM | POA: Insufficient documentation

## 2019-11-14 DIAGNOSIS — I5021 Acute systolic (congestive) heart failure: Secondary | ICD-10-CM | POA: Insufficient documentation

## 2019-11-14 DIAGNOSIS — Z79899 Other long term (current) drug therapy: Secondary | ICD-10-CM | POA: Insufficient documentation

## 2019-11-14 DIAGNOSIS — I255 Ischemic cardiomyopathy: Secondary | ICD-10-CM | POA: Diagnosis not present

## 2019-11-14 DIAGNOSIS — F0390 Unspecified dementia without behavioral disturbance: Secondary | ICD-10-CM | POA: Diagnosis not present

## 2019-11-14 DIAGNOSIS — R4182 Altered mental status, unspecified: Secondary | ICD-10-CM | POA: Diagnosis present

## 2019-11-14 DIAGNOSIS — N183 Chronic kidney disease, stage 3 unspecified: Secondary | ICD-10-CM | POA: Insufficient documentation

## 2019-11-14 DIAGNOSIS — Z87891 Personal history of nicotine dependence: Secondary | ICD-10-CM | POA: Insufficient documentation

## 2019-11-14 DIAGNOSIS — I130 Hypertensive heart and chronic kidney disease with heart failure and stage 1 through stage 4 chronic kidney disease, or unspecified chronic kidney disease: Secondary | ICD-10-CM | POA: Insufficient documentation

## 2019-11-14 DIAGNOSIS — E1122 Type 2 diabetes mellitus with diabetic chronic kidney disease: Secondary | ICD-10-CM | POA: Insufficient documentation

## 2019-11-14 DIAGNOSIS — Z20822 Contact with and (suspected) exposure to covid-19: Secondary | ICD-10-CM | POA: Diagnosis not present

## 2019-11-14 DIAGNOSIS — R451 Restlessness and agitation: Secondary | ICD-10-CM | POA: Diagnosis not present

## 2019-11-14 DIAGNOSIS — I252 Old myocardial infarction: Secondary | ICD-10-CM | POA: Diagnosis not present

## 2019-11-14 DIAGNOSIS — Z7982 Long term (current) use of aspirin: Secondary | ICD-10-CM | POA: Insufficient documentation

## 2019-11-14 DIAGNOSIS — F29 Unspecified psychosis not due to a substance or known physiological condition: Secondary | ICD-10-CM | POA: Diagnosis not present

## 2019-11-14 DIAGNOSIS — R4689 Other symptoms and signs involving appearance and behavior: Secondary | ICD-10-CM

## 2019-11-14 LAB — TROPONIN I (HIGH SENSITIVITY)
Troponin I (High Sensitivity): 40 ng/L — ABNORMAL HIGH (ref <18)
Troponin I (High Sensitivity): 43 ng/L — ABNORMAL HIGH (ref <18)

## 2019-11-14 LAB — CBC WITH DIFFERENTIAL/PLATELET
Abs Immature Granulocytes: 0.07 10*3/uL (ref 0.00–0.07)
Basophils Absolute: 0 10*3/uL (ref 0.0–0.1)
Basophils Relative: 0 %
Eosinophils Absolute: 0.2 10*3/uL (ref 0.0–0.5)
Eosinophils Relative: 2 %
HCT: 41.4 % (ref 39.0–52.0)
Hemoglobin: 13.6 g/dL (ref 13.0–17.0)
Immature Granulocytes: 1 %
Lymphocytes Relative: 27 %
Lymphs Abs: 2.7 10*3/uL (ref 0.7–4.0)
MCH: 33.3 pg (ref 26.0–34.0)
MCHC: 32.9 g/dL (ref 30.0–36.0)
MCV: 101.5 fL — ABNORMAL HIGH (ref 80.0–100.0)
Monocytes Absolute: 0.8 10*3/uL (ref 0.1–1.0)
Monocytes Relative: 8 %
Neutro Abs: 6.4 10*3/uL (ref 1.7–7.7)
Neutrophils Relative %: 62 %
Platelets: 239 10*3/uL (ref 150–400)
RBC: 4.08 MIL/uL — ABNORMAL LOW (ref 4.22–5.81)
RDW: 14.3 % (ref 11.5–15.5)
WBC: 10.1 10*3/uL (ref 4.0–10.5)
nRBC: 0 % (ref 0.0–0.2)

## 2019-11-14 LAB — URINALYSIS, COMPLETE (UACMP) WITH MICROSCOPIC
Bacteria, UA: NONE SEEN
Bilirubin Urine: NEGATIVE
Glucose, UA: NEGATIVE mg/dL
Hgb urine dipstick: NEGATIVE
Ketones, ur: NEGATIVE mg/dL
Leukocytes,Ua: NEGATIVE
Nitrite: NEGATIVE
Protein, ur: 100 mg/dL — AB
Specific Gravity, Urine: 1.021 (ref 1.005–1.030)
pH: 5 (ref 5.0–8.0)

## 2019-11-14 LAB — COMPREHENSIVE METABOLIC PANEL
ALT: 40 U/L (ref 0–44)
AST: 42 U/L — ABNORMAL HIGH (ref 15–41)
Albumin: 3.8 g/dL (ref 3.5–5.0)
Alkaline Phosphatase: 119 U/L (ref 38–126)
Anion gap: 7 (ref 5–15)
BUN: 55 mg/dL — ABNORMAL HIGH (ref 8–23)
CO2: 27 mmol/L (ref 22–32)
Calcium: 9.6 mg/dL (ref 8.9–10.3)
Chloride: 106 mmol/L (ref 98–111)
Creatinine, Ser: 1.28 mg/dL — ABNORMAL HIGH (ref 0.61–1.24)
GFR calc Af Amer: >60 mL/min (ref >60)
GFR calc non Af Amer: 54 mL/min — ABNORMAL LOW (ref >60)
Glucose, Bld: 142 mg/dL — ABNORMAL HIGH (ref 70–99)
Potassium: 4.5 mmol/L (ref 3.5–5.1)
Sodium: 140 mmol/L (ref 135–145)
Total Bilirubin: 0.6 mg/dL (ref 0.3–1.2)
Total Protein: 8.4 g/dL — ABNORMAL HIGH (ref 6.5–8.1)

## 2019-11-14 LAB — TSH: TSH: 9.868 u[IU]/mL — ABNORMAL HIGH (ref 0.350–4.500)
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
## 2019-11-14 NOTE — BH Assessment (Signed)
Patient unavailable will return to assess.
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
## 2019-11-14 NOTE — ED Notes (Signed)
This nurse attempted to get urine sample from pt. Pt states that he is unable to urinate at this moment.

## 2019-11-14 NOTE — ED Notes (Signed)
Pt transported to CT ?

## 2019-11-14 NOTE — ED Notes (Signed)
Psychiatry at bedside.
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
## 2019-11-15 ENCOUNTER — Encounter: Payer: Self-pay | Admitting: Physician Assistant

## 2019-11-15 LAB — RESPIRATORY PANEL BY RT PCR (FLU A&B, COVID)
Influenza A by PCR: NEGATIVE
Influenza B by PCR: NEGATIVE
SARS Coronavirus 2 by RT PCR: NEGATIVE

## 2019-11-15 MED ORDER — DOCUSATE SODIUM 100 MG PO CAPS: 100.0000 mg | ORAL_CAPSULE | Freq: Every day | ORAL | Status: DC | PRN

## 2019-11-15 MED ORDER — ASPIRIN EC 81 MG PO TBEC
81.0000 mg | DELAYED_RELEASE_TABLET | Freq: Every day | ORAL | Status: DC
  Administered 2019-11-15 – 2019-11-22 (×8): 81 mg via ORAL
  Filled 2019-11-15 (×8): qty 1

## 2019-11-15 MED ORDER — PANTOPRAZOLE SODIUM 40 MG PO TBEC
40.0000 mg | DELAYED_RELEASE_TABLET | Freq: Every day | ORAL | Status: DC
  Administered 2019-11-15 – 2019-11-22 (×8): 40 mg via ORAL
  Filled 2019-11-15 (×8): qty 1

## 2019-11-15 MED ORDER — MELATONIN 5 MG PO TABS
10.0000 mg | ORAL_TABLET | Freq: Every day | ORAL | Status: DC
  Administered 2019-11-15 – 2019-11-21 (×7): 10 mg via ORAL
  Filled 2019-11-15 (×9): qty 2

## 2019-11-15 MED ORDER — CLOPIDOGREL BISULFATE 75 MG PO TABS
75.0000 mg | ORAL_TABLET | Freq: Every day | ORAL | Status: DC
  Administered 2019-11-15 – 2019-11-22 (×8): 75 mg via ORAL
  Filled 2019-11-15 (×8): qty 1

## 2019-11-15 MED ORDER — TRAMADOL HCL 50 MG PO TABS
50.0000 mg | ORAL_TABLET | Freq: Three times a day (TID) | ORAL | Status: DC | PRN
  Administered 2019-11-22: 01:00:00 50 mg via ORAL
  Filled 2019-11-15: qty 1

## 2019-11-15 MED ORDER — LOSARTAN POTASSIUM 25 MG PO TABS
12.5000 mg | ORAL_TABLET | Freq: Every day | ORAL | Status: DC
  Administered 2019-11-15 – 2019-11-22 (×7): 12.5 mg via ORAL
  Filled 2019-11-15 (×8): qty 0.5

## 2019-11-15 MED ORDER — METFORMIN HCL 500 MG PO TABS
500.0000 mg | ORAL_TABLET | Freq: Two times a day (BID) | ORAL | Status: DC
  Administered 2019-11-16 – 2019-11-22 (×12): 500 mg via ORAL
  Filled 2019-11-15 (×12): qty 1

## 2019-11-15 MED ORDER — ACETAMINOPHEN 500 MG PO TABS
1000.0000 mg | ORAL_TABLET | Freq: Four times a day (QID) | ORAL | Status: DC | PRN
  Administered 2019-11-19 – 2019-11-21 (×2): 1000 mg via ORAL
  Filled 2019-11-15 (×2): qty 2

## 2019-11-15 MED ORDER — ADULT MULTIVITAMIN W/MINERALS CH
1.0000 | ORAL_TABLET | Freq: Every day | ORAL | Status: DC
  Administered 2019-11-15 – 2019-11-22 (×8): 1 via ORAL
  Filled 2019-11-15 (×8): qty 1

## 2019-11-15 MED ORDER — ATORVASTATIN CALCIUM 20 MG PO TABS
80.0000 mg | ORAL_TABLET | Freq: Every day | ORAL | Status: DC
  Administered 2019-11-15 – 2019-11-21 (×7): 80 mg via ORAL
  Filled 2019-11-15 (×7): qty 4

## 2019-11-15 NOTE — ED Notes (Signed)
Pt found out of bed and attempting to put his pants on. Pt removed all monitor leads/cords and had taken his IV out. Pt directed back to bed and bed alarm placed. Pt given TV remote and juice.

## 2019-11-15 NOTE — ED Notes (Signed)
This nurse sent a message to pharmacy and requested that pt medications that are not in ED pyxis be sent to ED.

## 2019-11-15 NOTE — ED Notes (Signed)
Referral information for Psychiatric Hospitalization faxed to;   [facility] [phone]),   . [facility] [phone] -or- [phone]) [phone]  . [facility] [phone]),  . [facility] [phone], [phone], [phone] or [phone]),   . [facility] [phone] -or- [phone]),   . [facility] [phone] or [phone])  . [facility] [phone] or [phone]),   . [facility] [phone])
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
## 2019-11-15 NOTE — ED Notes (Signed)
Pt sleeping in bed, respirations are unlabored and no signs of acute distress. Pt given warm blanket.

## 2019-11-15 NOTE — ED Notes (Signed)
Patient's brief, bed pad and linen changed. Patient repositioned in bed and given warm blankets. No further needs expressed at this time.

## 2019-11-15 NOTE — ED Notes (Signed)
Pt requested for this nurse to lay head of bed down, and turn off tv and lights. Pt states that he is going to try and go to sleep.

## 2019-11-15 NOTE — ED Notes (Signed)
Upon entering room pt is lying down. Pt denies any pain or discomfort. Pt expresses desires to watch tv and this RN turned on the tv for him.  Pt respirations are equal and unlabored, no signs of acute distress. Pt denies any further needs at this time.

## 2019-11-15 NOTE — ED Notes (Signed)
Spoke with patient's spouse regarding home medications. After completing the home medication history interview, the patient's spouse asked if patient's physician or a member of the treatment team could provide her with a document stating patient is currently incapacitated and unable to conduct household business. The patient's spouse was particularly concerned about being able to access bank accounts in order to pay bills -- it seems that she is unable to access their accounts or pay bills without his signature or input. Spouse was distraught on phone but also grateful for our care. Asked to relay to patient that she loves him.  ** The above is intended solely for informational and/or communicative purposes. It should in no way be considered an endorsement of any specific treatment, therapy or action. **

## 2019-11-15 NOTE — ED Notes (Signed)
Pt given sandwich tray 
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
## 2019-11-15 NOTE — ED Notes (Signed)
VOL  PENDING  PLACEMENT 

## 2019-11-15 NOTE — ED Notes (Signed)
Pt wife called and this RN spoke with her about pt status and plan of care. This nurse explained to her that he is medically cleared and psych is coordinating care for him. Pt wife states that she will call later tonight and check on him, and denies any further needs at this time.

## 2019-11-16 ENCOUNTER — Ambulatory Visit: Payer: Medicare Other | Admitting: Family

## 2019-11-16 NOTE — ED Notes (Signed)
Pt dressed into burgundy scrubs. pts belongings removed from room. Waiting on psych admission/acceptance.  Oriented to person and place. Disoriented to time and situation.  Keeps talking about people dying and talking to his clothes that are in the room for 1 hr.

## 2019-11-16 NOTE — ED Notes (Signed)
Pt given breakfast tray. Urinal remains at bedside. No other needs.

## 2019-11-16 NOTE — TOC Initial Note (Signed)
Transition of Care [facility]) - Initial/Assessment Note    Patient Details  Name: Kevin Case MRN: [id number] Date of Birth: 1941/10/13  Transition of Care [facility]) CM/SW Contact:    Trenton Founds, RN Phone Number: 11/16/2019, 8:11 AM  Clinical Narrative:       Late entry for 11/15/2019 at 6pm. RNCM received call from patient's wife forwarded through the ED secretary who reports she needs SW to obtain a letter from MD stating patient is not mentally capable of making financial decisions so she may access bank accounts to pay bill. Explained to wife that this CM will make request for follow up.                  Patient Goals and CMS Choice        Expected Discharge Plan and Services                                                Prior Living Arrangements/Services                       Activities of Daily Living      Permission Sought/Granted                  Emotional Assessment              Admission diagnosis:  EMS - Altered mental status Patient Active Problem List   Diagnosis Date Noted  . Altered mental status 11/14/2019  . Arthritis 10/31/2019  . Atherosclerotic peripheral vascular disease with intermittent claudication (HCC) 07/29/2019  . Chronic mesenteric ischemia (HCC) 06/18/2019  . Superior mesenteric artery stenosis (HCC) 06/18/2019  . Gastric ulcer   . Ischemic gastroenteritis   . Type 2 diabetes mellitus with stage 3 chronic kidney disease, without long-term current use of insulin (HCC) 05/06/2019  . GI bleed 04/02/2019  . Hypotension 04/02/2019  . Stenosis of left subclavian artery (HCC) 03/31/2019  . Non-ST elevation (NSTEMI) myocardial infarction (HCC)   . Acute systolic heart failure (HCC)   . Acute respiratory failure with hypoxia (HCC) 03/12/2019  . GERD (gastroesophageal reflux disease) 11/24/2018  . Subclinical hypothyroidism 01/29/2018  . Actinic keratosis 06/25/2016  . Benign nodular prostatic hyperplasia  04/04/2016  . Sinus bradycardia 02/09/2016  . Aortic sclerosis 12/14/2015  . Bilateral carotid artery stenosis 12/14/2015  . Seborrheic dermatitis 02/13/2011  . Essential hypertension 02/01/2010  . Hypercholesterolemia 02/01/2010  . Nondependent alcohol abuse, in remission 02/01/2010  . Tobacco use disorder, moderate, in sustained remission 02/01/2010   PCP:  Trey Sailors, PA-C Pharmacy:   [facility] [address], [place] - [address] [place] [address] Phone: [phone] Fax: [phone]     Social Determinants of Health (SDOH) Interventions    Readmission Risk Interventions Readmission Risk Prevention Plan 06/01/2019  Transportation Screening Complete  Home Care Screening Patient refused  Medication Review (RN CM) Complete  Some recent data might be hidden

## 2019-11-16 NOTE — ED Notes (Signed)
Upon entering pt room, he is lying in bed and states that he is going to try and go back to sleep to get rest. Pt respirations are equal and unlabored. No signs of acute distress.

## 2019-11-16 NOTE — ED Notes (Signed)
Pt awake in bed. NAD.

## 2019-11-16 NOTE — ED Notes (Signed)
NT in room picking food off floor.  Per nichole NT pt threw his foot across the room because he didn't want it.  Informed pt does not need to be throwing things just because he does not want them.

## 2019-11-16 NOTE — ED Notes (Signed)
VOL  PENDING  PLACEMENT 

## 2019-11-16 NOTE — ED Notes (Signed)
Pt given phone to talk to his wife at this time.

## 2019-11-16 NOTE — ED Notes (Signed)
Pt given meal tray at this time 

## 2019-11-16 NOTE — ED Notes (Signed)
This nurse repositioned and move pt up in the bed.

## 2019-11-16 NOTE — ED Notes (Signed)
Dr. Don Perking notified of pt BP of 86/59 and 96/76, states no interventions needed at this time and to notify her if pt BP continues to drop.

## 2019-11-16 NOTE — ED Notes (Signed)
Pt refusing to eat breakfast.  Attempted to encourage. Pt angry because feels we have separated him from his wife.  Explained I can call her but is is convinced that someone is pretending to be his wife.

## 2019-11-16 NOTE — ED Notes (Signed)
This nurse walked by patient room and patient had pulled his pants off and had his legs in the air. Pt had feces on his bed/body and was attempting to get up. Pt cleaned up by this nurse and Lenord Fellers, RN. Pt changed into clean clothes, sheets/blankets changed, and pt wiped down. Pt clean at this time. Will continue to monitor.

## 2019-11-17 ENCOUNTER — Other Ambulatory Visit: Payer: Medicare Other

## 2019-11-17 NOTE — ED Notes (Addendum)
Pt assisted to the bathroom. Pt able to stand and ambulate with a standby assist and cane. TV turned on and pt assisted back to bed. NAD at this time.

## 2019-11-17 NOTE — ED Notes (Signed)
Pt ambulating in room when this RN entered there room, gait steady, using cane for support. NAD noted, no complaints voiced. Pt returned to recliner and is now eating lunch

## 2019-11-17 NOTE — ED Notes (Signed)
Report given to Sarah RN

## 2019-11-17 NOTE — ED Notes (Signed)
Pt assisted back to bed per request, pt in NAD, no complaints voiced

## 2019-11-17 NOTE — ED Notes (Signed)
Patient is alert and on stretcher.  His bed alarm goes off often, so we will plan to get a hospital bed for him.  He talks about multiple things with no sense and is pleasant.

## 2019-11-17 NOTE — ED Notes (Signed)
Pt lying in bed resting with eyes closed, equal unlabored RR. Nothing needed from staff at this time. Will continue to monitor

## 2019-11-17 NOTE — ED Provider Notes (Signed)
-----------------------------------------   8:13 AM on 11/17/2019 -----------------------------------------   BP 138/66   Pulse 79   Temp 98.8 F (37.1 C) (Oral)   Resp 16   Ht 5\' 9"  (1.753 m)   Wt 71.7 kg   SpO2 96%   BMI 23.33 kg/m   The patient is calm and cooperative at this time.  There have been no acute events since the last update.  Awaiting disposition plan from Behavioral Medicine and/or Social Work team(s).  Psychiatry recommending admission. Awaiting geropsychiatry placement.    ., MD 11/17/19 [phone]

## 2019-11-17 NOTE — ED Notes (Signed)
This RN observed that pt was no longer in his recliner, pt was on toilet. PT ambulated back to bed with steady gait using cain. Pt educated to call for assistance with ambulation

## 2019-11-17 NOTE — ED Notes (Signed)
Pt did not want to eat dinner even thought this RN encouraged him to

## 2019-11-17 NOTE — ED Notes (Signed)
Pt given dinner tray but reports he is not hungry at this time

## 2019-11-17 NOTE — ED Notes (Signed)
Pt at 75% of lunch tray

## 2019-11-17 NOTE — ED Notes (Signed)
Patient up and ambulaed in room with cane and voided in commode.  He is friendly and coopertive.  Patient got back in hospital bed with alarm set.

## 2019-11-17 NOTE — ED Notes (Signed)
Pt given lunch tray and water  

## 2019-11-17 NOTE — ED Notes (Signed)
Pt checked and is dry at this time. Pt denies any needs.

## 2019-11-17 NOTE — ED Notes (Signed)
Patient got up in room. Went to sink and cleaned dentures and put back in his mouth.  He wanted coffee, so I have him sitting in a recliner within vision of desk.

## 2019-11-17 NOTE — ED Notes (Signed)
Pt's wife updated on pt's status and plan

## 2019-11-18 ENCOUNTER — Ambulatory Visit: Payer: Self-pay | Admitting: Physician Assistant

## 2019-11-18 LAB — T4, FREE: Free T4: 0.9 ng/dL (ref 0.61–1.12)

## 2019-11-18 LAB — TSH: TSH: 11.616 u[IU]/mL — ABNORMAL HIGH (ref 0.350–4.500)

## 2019-11-18 NOTE — ED Notes (Signed)
Pts lights off as pt is sleeping at this time. Will continue to monitor Q15 minute rounds.

## 2019-11-18 NOTE — ED Notes (Signed)
Patient states he is having difficulty eating ED tray due to dentures. Patient states he cannot eat potato chips. Diet order modified.

## 2019-11-18 NOTE — ED Provider Notes (Signed)
-----------------------------------------   7:09 AM on 11/18/2019 -----------------------------------------   Blood pressure (!) 147/59, pulse 73, temperature 98.8 F (37.1 C), temperature source Oral, resp. rate 16, height 5\' 9"  (1.753 m), weight 71.7 kg, SpO2 98 %.  The patient is calm and cooperative at this time.  There have been no acute events since the last update.  Awaiting disposition plan from Behavioral Medicine and/or Social Work team(s).   , MD 11/18/19 [phone]

## 2019-11-18 NOTE — ED Notes (Signed)
TSH elevated in blood work over the weekend. Addressed with MD. TSH and T4 to be repeated to determine treatment plan. Patient does not have known history of being on thyroid medication.  Social worker contacted to call daughter in Social worker.

## 2019-11-18 NOTE — ED Notes (Signed)
Patient is resting in a darkened room. Patient woke easily when this writer came into the room. Patient put his dentures on bedside table. Dentures were placed in denture cup and left in the room.

## 2019-11-18 NOTE — ED Notes (Signed)
Patient is resting on bed. NAD. Room is darkened for comfort.

## 2019-11-18 NOTE — ED Notes (Signed)
Patient is resting. NAD. Hallway door is open. Room is darkened for comfort.

## 2019-11-18 NOTE — ED Notes (Signed)
Awaiting meal tray to give Metformin.

## 2019-11-18 NOTE — BH Assessment (Signed)
Late Entry  Referral information refaxed to Psychiatric Hospitalization faxed to;   . [facility] ([phone]),  . [facility] ([phone] or [phone]),   . [facility] ([phone]). 

## 2019-11-18 NOTE — BH Assessment (Signed)
Writer called and spoke with patient's wife Scientist, forensic Murty-[phone]) and updated her on patient's disposition.   Informed them of the process of a patient been placed at another facility.

## 2019-11-18 NOTE — ED Notes (Signed)
Patient given noon meal. Patient is alert and oriented, states he doesn't want anything on the tray.

## 2019-11-18 NOTE — ED Notes (Signed)
Gave breakfast tray with juice. 

## 2019-11-18 NOTE — ED Notes (Signed)
Dinner tray given

## 2019-11-18 NOTE — ED Notes (Signed)
Pt eating dinner. No other concerns voiced at this time. Will continue to monitor Q15 minute rounds.

## 2019-11-18 NOTE — ED Notes (Signed)
VOL  PENDING  PLACEMENT 

## 2019-11-18 NOTE — ED Notes (Signed)
Patient assisted to room commode. Patient used his cane and had a steady gait.

## 2019-11-18 NOTE — ED Notes (Signed)
Sent rainbow to lab. 

## 2019-11-19 NOTE — ED Notes (Signed)
Patient yelling out that he wants to go home he has things to do. Reoriented patient to place and time. He was worried that his wife and granddaughter were still out in the "field waiting on him." I told patient we had gotten them home and that they were safe. He was relieved to hear that but stated he still needed to go home because he had " a stack of paperwork to do." reminded patient that he was to be admitted. Will continue to monitor.

## 2019-11-19 NOTE — ED Notes (Signed)
Breakfast tray provided to pt at this time.  

## 2019-11-19 NOTE — ED Notes (Signed)
Pt given meal tray for lunch 

## 2019-11-19 NOTE — BH Assessment (Signed)
Patient pending review with [facility] ([phone]) 

## 2019-11-19 NOTE — ED Provider Notes (Signed)
-----------------------------------------   5:57 AM on 11/19/2019 -----------------------------------------   Blood pressure 105/76, pulse 78, temperature 98.8 F (37.1 C), temperature source Oral, resp. rate 17, height 5\' 9"  (1.753 m), weight 71.7 kg, SpO2 98 %.  The patient is sleeping at this time.  There have been no acute events since the last update.  Awaiting disposition plan from Behavioral Medicine and/or Social Work team(s).   , MD 11/19/19 [phone]

## 2019-11-19 NOTE — ED Notes (Signed)
VOL  PENDING  PLACEMENT 

## 2019-11-20 DIAGNOSIS — R451 Restlessness and agitation: Secondary | ICD-10-CM

## 2019-11-20 DIAGNOSIS — F29 Unspecified psychosis not due to a substance or known physiological condition: Secondary | ICD-10-CM | POA: Diagnosis present

## 2019-11-20 DIAGNOSIS — R4689 Other symptoms and signs involving appearance and behavior: Secondary | ICD-10-CM | POA: Insufficient documentation

## 2019-11-20 LAB — RESPIRATORY PANEL BY RT PCR (FLU A&B, COVID)
Influenza A by PCR: NEGATIVE
Influenza B by PCR: NEGATIVE
SARS Coronavirus 2 by RT PCR: NEGATIVE

## 2019-11-20 MED ORDER — QUETIAPINE FUMARATE 25 MG PO TABS
25.0000 mg | ORAL_TABLET | Freq: Every day | ORAL | Status: DC
  Administered 2019-11-20: 21:00:00 25 mg via ORAL
  Filled 2019-11-20: qty 1

## 2019-11-20 MED ORDER — DIVALPROEX SODIUM 125 MG PO CSDR
125.0000 mg | DELAYED_RELEASE_CAPSULE | Freq: Two times a day (BID) | ORAL | Status: DC
  Administered 2019-11-20 – 2019-11-22 (×5): 125 mg via ORAL
  Filled 2019-11-20 (×6): qty 1

## 2019-11-20 MED ORDER — LORAZEPAM 2 MG/ML IJ SOLN
1.0000 mg | Freq: Once | INTRAMUSCULAR | Status: AC
  Administered 2019-11-20: 01:00:00 1 mg via INTRAMUSCULAR
  Filled 2019-11-20: qty 1

## 2019-11-20 NOTE — ED Notes (Signed)
Pt st 'I need to go to work now". This Rn reoriented pt in time and place. Pt cooperative and calm at this time.

## 2019-11-20 NOTE — ED Notes (Signed)
PT much more calm since receiving IM medication. PT still alert but yelling much less.

## 2019-11-20 NOTE — ED Notes (Signed)
Pt provided with phone to speak with wife.

## 2019-11-20 NOTE — ED Notes (Signed)
Pt walked out of the room. Pt redirected to room 24. Pt in bed safely at this time.

## 2019-11-20 NOTE — ED Notes (Signed)
This Rn spoke with pt wife at this time and answered questions and concerns regarding pt.
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
## 2019-11-20 NOTE — ED Notes (Signed)
Pt provided with breakfast at this time. Juice and water offered to pt.

## 2019-11-20 NOTE — ED Notes (Signed)
Patient out of bed and into the hallway. Swung his cane at a nurse. [employer] officer working the quad area was able to have patient give him his cane. Patient redirected but states we "are liars" and

## 2019-11-20 NOTE — ED Notes (Signed)
Pt provided with lunch tray and juice at bedside at this time.

## 2019-11-20 NOTE — ED Provider Notes (Signed)
-----------------------------------------   1:16 AM on 11/20/2019 -----------------------------------------  Patient yelling and hitting staff.  Unable to be verbally redirected.  QTC is prolonged at 511 so we will hold Haldol.  Will administer low-dose IM Ativan for calming.   ----------------------------------------- 6:52 AM on 11/20/2019 -----------------------------------------  Remote no further events.  Patient resting in no acute distress.  Remains in the ED pending behavioral medicine/social work disposition.   Irean Hong, MD 11/20/19 [phone]

## 2019-11-20 NOTE — ED Notes (Signed)
VOL/Accepted to [facility] pending Covid results

## 2019-11-20 NOTE — ED Notes (Signed)
Resting quietly at this time, no acute distress noted. °

## 2019-11-20 NOTE — ED Notes (Signed)
Kevin Case [phone] contacted to provide pt status update.

## 2019-11-20 NOTE — BH Assessment (Signed)
Patient has been accepted to [facility], Pending COVID results Patient assigned to Geriatric Unit Accepting physician is Dr. Britta Mccreedy Cohen-Webb.  Call report to [phone].  Representative was [place] .   ER Staff is aware of it:  Glenda, ER Secretary  Dr. Erma Heritage, ER MD  Lenord Fellers Patient's Nurse     Address: [address],  [place], [place] [address]  Requesting COVID results faxed to [phone]   Writer informed Berton Lan, the patient will not transfer tonight (11/21/2019) due to transportation. They will save his bed.

## 2019-11-21 MED ORDER — QUETIAPINE FUMARATE 25 MG PO TABS
50.0000 mg | ORAL_TABLET | Freq: Every day | ORAL | Status: DC
  Administered 2019-11-21: 22:00:00 50 mg via ORAL
  Filled 2019-11-21: qty 2

## 2019-11-21 NOTE — ED Notes (Signed)
Pt c/o back pain will give tylenol per his request.

## 2019-11-21 NOTE — ED Notes (Signed)
Pt given meal tray.

## 2019-11-21 NOTE — ED Notes (Addendum)
Pt is pleasant and conversational at this time. Turned TV off and dimmed lights per pt request- pt resting comfortably in bed- states he doesn't need anything else at the moment.

## 2019-11-21 NOTE — ED Notes (Signed)
Assisted patient back to bed.  No acute distress noted.

## 2019-11-21 NOTE — ED Notes (Signed)
Patient resting with eyes closed no acute distress noted.

## 2019-11-21 NOTE — ED Notes (Signed)
Pt given breakfast tray

## 2019-11-21 NOTE — ED Notes (Signed)
Resting quietly with eyes closed, no acute distress noted. °

## 2019-11-21 NOTE — ED Provider Notes (Signed)
-----------------------------------------   5:26 AM on 11/21/2019 -----------------------------------------   Blood pressure (!) 107/56, pulse 78, temperature 97.8 F (36.6 C), temperature source Oral, resp. rate 15, height 5\' 9"  (1.753 m), weight 71.7 kg, SpO2 99 %.  No acute events since last update.  Continues to await disposition from social work for placement.    , MD 11/21/19 [phone]

## 2019-11-21 NOTE — ED Notes (Signed)
Wife called at this time- update provided and pt speaking wife on mobile phone.

## 2019-11-21 NOTE — ED Notes (Signed)
Patient standing in doorway of room.  Patient reoriented and given snack.

## 2019-11-21 NOTE — BH Assessment (Signed)
Writer received phone call from [facility] ([place]-[phone]), they can guarantee the patient bed will remain available till tomorrow (11/22/2019). However, was advised to call back Monday (11/22/2019) to make sure it's still available for Tuesday (11/23/2019). Number to call is, [phone] and asked for intake. Staff member also reports, the patient's wife called and told them the patient didn't' have a bed and wasn't covered by his insurance and that's why he hasn't transported to them. Writer assured him that wasn't the reason and he will contact the wife again.  Writer called and spoke with patient's wife Scientist, forensic [phone]) and updated her about the patient's disposition. Also informed her he was made aware of the phone call to [facility] and reiterated he does have a bed with them.

## 2019-11-21 NOTE — BH Assessment (Signed)
Writer faxed COVID results to [facility].

## 2019-11-21 NOTE — ED Notes (Signed)
Pt asleep in bed, NAD noted. Light down, TV on, Pt has blanket. Will allow pt to rest.

## 2019-11-21 NOTE — ED Notes (Signed)
Pt provided juice, states he had lunch today. Pt is pleasant and oriented to self and situation. Informed pt of dinner time and cleaned and replaced urinal at bedside.

## 2019-11-21 NOTE — BH Assessment (Addendum)
Writer called [facility] ([phone]) and updated them the patient can not transport to them until Tuesday (11/23/2019). They said they will tell their supervisor and hold his bed. Per their request, writer refaxed the patient's COVID results and confirmed it was received ([phone]).

## 2019-11-21 NOTE — ED Notes (Signed)
Patient resting quietly with eyes closed. No acute distress noted.

## 2019-11-21 NOTE — ED Notes (Signed)
Changed TV channel for pt.

## 2019-11-21 NOTE — ED Notes (Signed)
Pt assisted to have a BM- pt back in bed with lights off and TV on

## 2019-11-22 ENCOUNTER — Encounter: Payer: Self-pay | Admitting: Physician Assistant

## 2019-11-22 NOTE — ED Provider Notes (Addendum)
Patient accepted to be transferred to [facility].      Miguel Aschoff., MD 11/22/19 1226

## 2019-11-22 NOTE — ED Notes (Signed)
Pt woke up and c/o back and leg pain. Pt medicated (see EMAR). Pt stretched out legs and sitting on side of bed at this time. Pt also required some reorientation, but is still amicable.

## 2019-11-22 NOTE — ED Notes (Signed)
Pt now sitting in chair- grape juice provided per his request.

## 2019-11-22 NOTE — ED Notes (Signed)
Pt up to restroom independently.

## 2019-11-22 NOTE — ED Notes (Signed)
Tech speaking with pt

## 2019-11-22 NOTE — ED Provider Notes (Signed)
-----------------------------------------   5:02 AM on 11/22/2019 -----------------------------------------   Blood pressure 108/67, pulse 61, temperature 97.8 F (36.6 C), temperature source Oral, resp. rate 18, height 5\' 9"  (1.753 m), weight 71.7 kg, SpO2 96 %.  The patient had no acute events since last update.  Calm and cooperative at this time.  Disposition is pending per Psychiatry/Behavioral Medicine team recommendations.     , Don Perking, MD 11/22/19 [phone]

## 2019-11-22 NOTE — ED Notes (Signed)
[facility]  DEPT  CALLED  FOR TRANSPORT 

## 2019-11-22 NOTE — ED Notes (Signed)
PT  PLACED  UNDER  IVC  PAPERS  PENDING  TRANSFER  TO  [facility]

## 2019-11-22 NOTE — ED Notes (Signed)
Pt reports the pain medicine is "kicking in" and he feels relief.

## 2019-11-23 MED ORDER — GENERIC EXTERNAL MEDICATION
Status: DC
Start: ? — End: 2019-11-23

## 2019-11-23 MED ORDER — LOPERAMIDE HCL 2 MG PO CAPS
2.00 | ORAL_CAPSULE | ORAL | Status: DC
Start: ? — End: 2019-11-23

## 2019-11-23 MED ORDER — LORATADINE 10 MG PO TABS
10.00 | ORAL_TABLET | ORAL | Status: DC
Start: ? — End: 2019-11-23

## 2019-11-23 MED ORDER — DEXTROMETHORPHAN-GUAIFENESIN 10-100 MG/5ML PO LIQD
10.00 | ORAL | Status: DC
Start: ? — End: 2019-11-23

## 2019-11-23 MED ORDER — ZOLPIDEM TARTRATE 5 MG PO TABS
5.00 | ORAL_TABLET | ORAL | Status: DC
Start: ? — End: 2019-11-23

## 2019-11-23 MED ORDER — ACETAMINOPHEN 325 MG PO TABS
650.00 | ORAL_TABLET | ORAL | Status: DC
Start: ? — End: 2019-11-23

## 2019-11-23 MED ORDER — SENNOSIDES-DOCUSATE SODIUM 8.6-50 MG PO TABS
1.00 | ORAL_TABLET | ORAL | Status: DC
Start: ? — End: 2019-11-23

## 2019-11-23 MED ORDER — ALUM & MAG HYDROXIDE-SIMETH 200-200-20 MG/5ML PO SUSP
30.00 | ORAL | Status: DC
Start: ? — End: 2019-11-23

## 2019-11-30 ENCOUNTER — Encounter: Payer: Self-pay | Admitting: Physician Assistant

## 2019-12-03 ENCOUNTER — Encounter (INDEPENDENT_AMBULATORY_CARE_PROVIDER_SITE_OTHER): Payer: Medicare Other

## 2019-12-03 ENCOUNTER — Other Ambulatory Visit: Payer: Self-pay

## 2019-12-03 ENCOUNTER — Encounter: Payer: Self-pay | Admitting: Physician Assistant

## 2019-12-03 ENCOUNTER — Ambulatory Visit (INDEPENDENT_AMBULATORY_CARE_PROVIDER_SITE_OTHER): Payer: Medicare Other | Admitting: Vascular Surgery

## 2019-12-03 ENCOUNTER — Ambulatory Visit (INDEPENDENT_AMBULATORY_CARE_PROVIDER_SITE_OTHER): Payer: Medicare Other | Admitting: Physician Assistant

## 2019-12-03 VITALS — BP 128/68 | Temp 97.5°F | Wt 142.2 lb

## 2019-12-03 DIAGNOSIS — E039 Hypothyroidism, unspecified: Secondary | ICD-10-CM | POA: Diagnosis not present

## 2019-12-03 DIAGNOSIS — E1122 Type 2 diabetes mellitus with diabetic chronic kidney disease: Secondary | ICD-10-CM | POA: Diagnosis not present

## 2019-12-03 DIAGNOSIS — N183 Chronic kidney disease, stage 3 unspecified: Secondary | ICD-10-CM

## 2019-12-03 DIAGNOSIS — F29 Unspecified psychosis not due to a substance or known physiological condition: Secondary | ICD-10-CM | POA: Diagnosis not present

## 2019-12-03 DIAGNOSIS — R4189 Other symptoms and signs involving cognitive functions and awareness: Secondary | ICD-10-CM

## 2019-12-03 DIAGNOSIS — I10 Essential (primary) hypertension: Secondary | ICD-10-CM

## 2019-12-03 DIAGNOSIS — R4689 Other symptoms and signs involving appearance and behavior: Secondary | ICD-10-CM

## 2019-12-03 DIAGNOSIS — R911 Solitary pulmonary nodule: Secondary | ICD-10-CM

## 2019-12-03 DIAGNOSIS — R159 Full incontinence of feces: Secondary | ICD-10-CM

## 2019-12-03 MED ORDER — SITAGLIPTIN PHOSPHATE 25 MG PO TABS: 25.0000 mg | ORAL_TABLET | Freq: Every day | ORAL | 0 refills | Status: DC
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
## 2019-12-04 LAB — CBC WITH DIFFERENTIAL/PLATELET
Basophils Absolute: 0 10*3/uL (ref 0.0–0.2)
Basos: 0 %
EOS (ABSOLUTE): 0.2 10*3/uL (ref 0.0–0.4)
Eos: 2 %
Hematocrit: 37.9 % (ref 37.5–51.0)
Hemoglobin: 12.9 g/dL — ABNORMAL LOW (ref 13.0–17.7)
Immature Grans (Abs): 0 10*3/uL (ref 0.0–0.1)
Immature Granulocytes: 0 %
Lymphocytes Absolute: 1.6 10*3/uL (ref 0.7–3.1)
Lymphs: 16 %
MCH: 33.9 pg — ABNORMAL HIGH (ref 26.6–33.0)
MCHC: 34 g/dL (ref 31.5–35.7)
MCV: 100 fL — ABNORMAL HIGH (ref 79–97)
Monocytes Absolute: 0.8 10*3/uL (ref 0.1–0.9)
Monocytes: 8 %
Neutrophils Absolute: 7.6 10*3/uL — ABNORMAL HIGH (ref 1.4–7.0)
Neutrophils: 74 %
Platelets: 170 10*3/uL (ref 150–450)
RBC: 3.8 x10E6/uL — ABNORMAL LOW (ref 4.14–5.80)
RDW: 12.5 % (ref 11.6–15.4)
WBC: 10.3 10*3/uL (ref 3.4–10.8)

## 2019-12-04 LAB — COMPREHENSIVE METABOLIC PANEL
ALT: 31 IU/L (ref 0–44)
AST: 37 IU/L (ref 0–40)
Albumin/Globulin Ratio: 1.1 — ABNORMAL LOW (ref 1.2–2.2)
Albumin: 3.5 g/dL — ABNORMAL LOW (ref 3.7–4.7)
Alkaline Phosphatase: 118 IU/L — ABNORMAL HIGH (ref 39–117)
BUN/Creatinine Ratio: 22 (ref 10–24)
BUN: 25 mg/dL (ref 8–27)
Bilirubin Total: 0.3 mg/dL (ref 0.0–1.2)
CO2: 25 mmol/L (ref 20–29)
Calcium: 9 mg/dL (ref 8.6–10.2)
Chloride: 104 mmol/L (ref 96–106)
Creatinine, Ser: 1.15 mg/dL (ref 0.76–1.27)
GFR calc Af Amer: 71 mL/min/{1.73_m2} (ref >59)
GFR calc non Af Amer: 61 mL/min/{1.73_m2} (ref >59)
Globulin, Total: 3.3 g/dL (ref 1.5–4.5)
Glucose: 175 mg/dL — ABNORMAL HIGH (ref 65–99)
Potassium: 4.3 mmol/L (ref 3.5–5.2)
Sodium: 141 mmol/L (ref 134–144)
Total Protein: 6.8 g/dL (ref 6.0–8.5)

## 2019-12-04 LAB — TSH: TSH: 5.27 u[IU]/mL — ABNORMAL HIGH (ref 0.450–4.500)

## 2019-12-06 ENCOUNTER — Telehealth: Payer: Self-pay

## 2019-12-06 MED ORDER — CLOPIDOGREL BISULFATE 75 MG PO TABS: 75.0000 mg | ORAL_TABLET | Freq: Every day | ORAL | 0 refills | Status: AC

## 2019-12-06 NOTE — Telephone Encounter (Signed)
Copied from CRM [id number]. Topic: General - Inquiry >> Dec 06, 2019  4:44 PM Deborha Payment wrote: Reason for CRM: Patient is requesting nurse to call back with lab results. Call back [phone]

## 2019-12-07 ENCOUNTER — Telehealth: Payer: Self-pay

## 2019-12-07 ENCOUNTER — Encounter: Payer: Self-pay | Admitting: Physician Assistant

## 2019-12-07 DIAGNOSIS — N183 Chronic kidney disease, stage 3 unspecified: Secondary | ICD-10-CM

## 2019-12-07 DIAGNOSIS — I771 Stricture of artery: Secondary | ICD-10-CM

## 2019-12-07 DIAGNOSIS — E1122 Type 2 diabetes mellitus with diabetic chronic kidney disease: Secondary | ICD-10-CM

## 2019-12-07 MED ORDER — JARDIANCE 10 MG PO TABS: 10.0000 mg | ORAL_TABLET | Freq: Every day | ORAL | 0 refills | Status: DC

## 2019-12-07 MED ORDER — METFORMIN HCL 500 MG PO TABS: 500.0000 mg | ORAL_TABLET | Freq: Two times a day (BID) | ORAL | 1 refills | Status: AC

## 2019-12-07 NOTE — Telephone Encounter (Signed)
Patients wife [name]) was advised. Donita states that she would like a different medication send into the pharmacy if possible because she will have to pay $141.00 copay for patients Januvia. Please advise.

## 2019-12-07 NOTE — Telephone Encounter (Signed)
Patient advised as below.  

## 2019-12-07 NOTE — Telephone Encounter (Signed)
Please see result note 

## 2019-12-07 NOTE — Telephone Encounter (Signed)
Mrs. Kevin Case advised below. Kevin Case and Kevin Case are requesting Metformin to be called into pharmacy. They refused to pick up Jardiance, due to high co-pay also. Please advise.

## 2019-12-07 NOTE — Telephone Encounter (Signed)
-----   Message from Trey Sailors, [place] sent at 12/07/2019 12:44 PM EST ----- Can we please add B12 and folate under macrocytic anemia? Thanks.     Kidney function improved from last time. Just slight anemia and large size of red blood cells. We can check a B12 and folate level to see if this is the cause. Thyroid levels improving on medicine but too early to tell full effect. We can recheck at follow up and adjust levels accordingly.   Best, Osvaldo Angst, PA-C

## 2019-12-07 NOTE — Telephone Encounter (Signed)
Sent!

## 2019-12-07 NOTE — Telephone Encounter (Signed)
Since his kidney function is looking better I will send in Jardiance 10 mg QD that he was previously on.

## 2019-12-07 NOTE — Addendum Note (Signed)
Addended by: Trey Sailors on: 12/07/2019 02:14 PM   Modules accepted: Orders

## 2019-12-07 NOTE — Telephone Encounter (Signed)
Patient advised of lab results

## 2019-12-08 ENCOUNTER — Encounter: Payer: Self-pay | Admitting: Physician Assistant

## 2019-12-08 LAB — SPECIMEN STATUS REPORT

## 2019-12-08 LAB — B12 AND FOLATE PANEL
Folate: 11.2 ng/mL (ref >3.0)
Vitamin B-12: 1383 pg/mL — ABNORMAL HIGH (ref 232–1245)

## 2019-12-09 ENCOUNTER — Telehealth: Payer: Self-pay | Admitting: Physician Assistant

## 2019-12-09 NOTE — Telephone Encounter (Signed)
Belenda Cruise with [employer].  States they received a very blury fax for depends and need to get that refaxed along with all pt's demographic information and insurance.  They also need the latest clinical notes as well. Fax number:  [phone].

## 2019-12-10 ENCOUNTER — Other Ambulatory Visit: Payer: Self-pay

## 2019-12-10 ENCOUNTER — Emergency Department: Payer: Medicare Other

## 2019-12-10 ENCOUNTER — Emergency Department
Admission: EM | Admit: 2019-12-10 | Discharge: 2019-12-10 | Disposition: A | Discharge status: Home / Self Care | Payer: Medicare Other | Attending: Emergency Medicine | Admitting: Emergency Medicine

## 2019-12-10 DIAGNOSIS — Z79899 Other long term (current) drug therapy: Secondary | ICD-10-CM | POA: Insufficient documentation

## 2019-12-10 DIAGNOSIS — Z87891 Personal history of nicotine dependence: Secondary | ICD-10-CM | POA: Insufficient documentation

## 2019-12-10 DIAGNOSIS — I110 Hypertensive heart disease with heart failure: Secondary | ICD-10-CM | POA: Insufficient documentation

## 2019-12-10 DIAGNOSIS — W010XXA Fall on same level from slipping, tripping and stumbling without subsequent striking against object, initial encounter: Secondary | ICD-10-CM | POA: Insufficient documentation

## 2019-12-10 DIAGNOSIS — Z7984 Long term (current) use of oral hypoglycemic drugs: Secondary | ICD-10-CM | POA: Diagnosis not present

## 2019-12-10 DIAGNOSIS — Z7982 Long term (current) use of aspirin: Secondary | ICD-10-CM | POA: Insufficient documentation

## 2019-12-10 DIAGNOSIS — S0990XA Unspecified injury of head, initial encounter: Secondary | ICD-10-CM | POA: Insufficient documentation

## 2019-12-10 DIAGNOSIS — E119 Type 2 diabetes mellitus without complications: Secondary | ICD-10-CM | POA: Diagnosis not present

## 2019-12-10 DIAGNOSIS — Y999 Unspecified external cause status: Secondary | ICD-10-CM | POA: Insufficient documentation

## 2019-12-10 DIAGNOSIS — S3992XA Unspecified injury of lower back, initial encounter: Secondary | ICD-10-CM | POA: Diagnosis present

## 2019-12-10 DIAGNOSIS — I509 Heart failure, unspecified: Secondary | ICD-10-CM | POA: Insufficient documentation

## 2019-12-10 DIAGNOSIS — Y92002 Bathroom of unspecified non-institutional (private) residence single-family (private) house as the place of occurrence of the external cause: Secondary | ICD-10-CM | POA: Insufficient documentation

## 2019-12-10 DIAGNOSIS — I2510 Atherosclerotic heart disease of native coronary artery without angina pectoris: Secondary | ICD-10-CM | POA: Insufficient documentation

## 2019-12-10 DIAGNOSIS — Y93E1 Activity, personal bathing and showering: Secondary | ICD-10-CM | POA: Insufficient documentation

## 2019-12-10 DIAGNOSIS — S32010A Wedge compression fracture of first lumbar vertebra, initial encounter for closed fracture: Secondary | ICD-10-CM | POA: Insufficient documentation

## 2019-12-10 MED ORDER — OXYCODONE HCL 5 MG PO TABS: 5.0000 mg | ORAL_TABLET | Freq: Four times a day (QID) | ORAL | 0 refills | Status: AC | PRN

## 2019-12-10 MED ORDER — OXYCODONE-ACETAMINOPHEN 5-325 MG PO TABS
1.0000 | ORAL_TABLET | Freq: Once | ORAL | Status: AC
  Administered 2019-12-10: 1 via ORAL
  Filled 2019-12-10: qty 1

## 2019-12-10 NOTE — ED Notes (Signed)
Bed alarm, yellow socks and bracelet in place. PT door to remain open.

## 2019-12-10 NOTE — Telephone Encounter (Signed)
Nichole w/ medical records is going to refax the rx, demographics and last office note to [facility] medical supply.

## 2019-12-10 NOTE — ED Triage Notes (Signed)
PT to ED via EMS from home. PT has had 2 falls today, first was around 2p and hit his head. This morning he fell again and could not get up. Small lac to back, bleeding controlled. PT demented at baseline, ambulatory on scene.  151/57 56HR 97% cbg 113

## 2019-12-10 NOTE — ED Provider Notes (Addendum)
[facility] Emergency Department Provider Note  ____________________________________________   First MD Initiated Contact with Patient 12/10/19 [phone]     (approximate)  I have reviewed the triage vital signs and the nursing notes.  Level 5 caveat history review of systems limited secondary to dementia HISTORY  Chief Complaint Fall   HPI Kevin Case is a 79 y.o. male   with below list of previous medical conditions including dementia presents to the emergency department via EMS following mechanical fall while in the shower tonight. EMS states that the patient had an additional fall yesterday at 2 PM that was mechanical in nature as well. Patient did strike his head tonight denies any loss of consciousness.       Past Medical History:  Diagnosis Date  . Anemia   . Blood transfusion without reported diagnosis   . CAD (coronary artery disease)    a. 03/2019 Cath: LM nl, LAD 50p CA2+, 52m, D1 50ost, LCX 20p, 55m, RCA small, mild diff dzs-->Med rx. Consider PCI LCX for refractory angina.  . Carotid arterial disease (HCC)    a. 03/2019 Carotid U/S: RICA 1-39%, RECA <50%, LICA 1-39%, LECA <50%.  . CHF (congestive heart failure) (HCC)   . Clotting disorder (HCC)   . Diabetes mellitus without complication (HCC)   . GIB (gastrointestinal bleeding)    a. 03/2019 BRBPR following PV procedure req anticoagulation-->2u PRBCs.  . Heart murmur   . HFrEF (heart failure with reduced ejection fraction) (HCC)    a. 03/2019 Echo: EF 30-35%, mild conc LVH. Mildly dil LA. Mod MV annular dil. Mod AS (mean grad , Valve area 0.76).  Marland Kitchen History of tobacco abuse   . Hypertension   . Ischemic cardiomyopathy    a. 03/2019 Echo: EF 30-35%.  . Left bundle branch block   . Moderate aortic stenosis    a. 03/2019 Echo:  Mod AS (mean grad , Valve area 0.76).  Marland Kitchen PAD (peripheral artery disease) (HCC)    a. 03/2019 PV Angio: RCFA 80, RSFA 100.  . Rib fracture   . Sinus  bradycardia   . Subclavian arterial stenosis (HCC)    a. 03/2019 PV Angio: RSCA 90 after origin of CCA. RCCA 30ost, LSCA 80 (PTA and covered stenting).    Patient Active Problem List   Diagnosis Date Noted  . Agitation 11/20/2019  . Psychosis (HCC) 11/20/2019  . Behavior concern   . Altered mental status 11/14/2019  . Arthritis 10/31/2019  . Atherosclerotic peripheral vascular disease with intermittent claudication (HCC) 07/29/2019  . Chronic mesenteric ischemia (HCC) 06/18/2019  . Superior mesenteric artery stenosis (HCC) 06/18/2019  . Gastric ulcer   . Ischemic gastroenteritis   . Type 2 diabetes mellitus with stage 3 chronic kidney disease, without long-term current use of insulin (HCC) 05/06/2019  . GI bleed 04/02/2019  . Hypotension 04/02/2019  . Stenosis of left subclavian artery (HCC) 03/31/2019  . Non-ST elevation (NSTEMI) myocardial infarction (HCC)   . Acute systolic heart failure (HCC)   . Acute respiratory failure with hypoxia (HCC) 03/12/2019  . GERD (gastroesophageal reflux disease) 11/24/2018  . Subclinical hypothyroidism 01/29/2018  . Actinic keratosis 06/25/2016  . Benign nodular prostatic hyperplasia 04/04/2016  . Sinus bradycardia 02/09/2016  . Aortic sclerosis 12/14/2015  . Bilateral carotid artery stenosis 12/14/2015  . Seborrheic dermatitis 02/13/2011  . Essential hypertension 02/01/2010  . Hypercholesterolemia 02/01/2010  . Nondependent alcohol abuse, in remission 02/01/2010  . Tobacco use disorder, moderate, in sustained remission 02/01/2010  Past Surgical History:  Procedure Laterality Date  . AORTIC ARCH ANGIOGRAPHY N/A 03/31/2019   Procedure: AORTIC ARCH ANGIOGRAPHY;  Surgeon: Iran Ouch, MD;  Location: [facility] INVASIVE CV LAB;  Service: Cardiovascular;  Laterality: N/A;  . CARDIAC CATHETERIZATION    . CORONARY ANGIOPLASTY    . ESOPHAGOGASTRODUODENOSCOPY (EGD) WITH PROPOFOL N/A 05/27/2019   Procedure: ESOPHAGOGASTRODUODENOSCOPY (EGD) WITH  PROPOFOL;  Surgeon: Toney Reil, MD;  Location[facility] ENDOSCOPY;  Service: Gastroenterology;  Laterality: N/A;  . ESOPHAGOGASTRODUODENOSCOPY (EGD) WITH PROPOFOL N/A 10/11/2019   Procedure: ESOPHAGOGASTRODUODENOSCOPY (EGD) WITH PROPOFOL;  Surgeon: Toney Reil, MD;  Location: [facility] ENDOSCOPY;  Service: Gastroenterology;  Laterality: N/A;  . LOWER EXTREMITY ANGIOGRAPHY Right 08/04/2019   Procedure: LOWER EXTREMITY ANGIOGRAPHY;  Surgeon: Annice Needy, MD;  Location: [facility] INVASIVE CV LAB;  Service: Cardiovascular;  Laterality: Right;  . PERIPHERAL VASCULAR INTERVENTION Left 03/31/2019   Procedure: PERIPHERAL VASCULAR INTERVENTION;  Surgeon: Iran Ouch, MD;  Location: [facility] INVASIVE CV LAB;  Service: Cardiovascular;  Laterality: Left;  . RIGHT/LEFT HEART CATH AND CORONARY ANGIOGRAPHY N/A 03/15/2019   Procedure: RIGHT/LEFT HEART CATH AND CORONARY ANGIOGRAPHY;  Surgeon: Iran Ouch, MD;  Location: [facility] INVASIVE CV LAB;  Service: Cardiovascular;  Laterality: N/A;  . VISCERAL ANGIOGRAPHY N/A 05/28/2019   Procedure: VISCERAL ANGIOGRAPHY;  Surgeon: Annice Needy, MD;  Location: [facility] INVASIVE CV LAB;  Service: Cardiovascular;  Laterality: N/A;    Prior to Admission medications   Medication Sig Start Date End Date Taking? Authorizing Provider  acetaminophen (TYLENOL) 500 MG tablet Take 1,000 mg by mouth every 6 (six) hours as needed (headache.).    [provider]  aspirin EC 81 MG tablet Take 1 tablet (81 mg total) by mouth daily. 08/04/19   Annice Needy, MD  atorvastatin (LIPITOR) 80 MG tablet TAKE 1 TABLET BY MOUTH ONCE DAILY AT BEDTIME Patient taking differently: Take 80 mg by mouth at bedtime.  10/18/19   Iran Ouch, MD  clopidogrel (PLAVIX) 75 MG tablet Take 1 tablet (75 mg total) by mouth daily. 12/06/19   Iran Ouch, MD  divalproex (DEPAKOTE ER) 250 MG 24 hr tablet Take 250 mg by mouth daily.    [provider]  docusate sodium (COLACE) 100 MG capsule  Take 100 mg by mouth daily as needed for mild constipation.     [provider]  empagliflozin (JARDIANCE) 10 MG TABS tablet Take 10 mg by mouth daily before breakfast. 12/07/19   Trey Sailors, PA-C  levothyroxine (SYNTHROID) 25 MCG tablet Take 25 mcg by mouth daily before breakfast.    [provider]  Melatonin 10 MG TABS Take 10 mg by mouth at bedtime.     [provider]  metFORMIN (GLUCOPHAGE) 500 MG tablet Take 1 tablet (500 mg total) by mouth 2 (two) times daily with a meal. 12/07/19   Trey Sailors, PA-C  mirtazapine (REMERON) 7.5 MG tablet Take 7.5 mg by mouth at bedtime.    [provider]  Multiple Vitamin (MULTIVITAMIN WITH MINERALS) TABS tablet Take 1 tablet by mouth daily.     [provider]  Omega-3 Fatty Acids (FISH OIL) 1000 MG CAPS Take 1,000 mg by mouth daily.    [provider]  omeprazole (PRILOSEC) 40 MG capsule Take 1 capsule (40 mg total) by mouth daily. 10/11/19 01/09/20  Toney Reil, MD  sitaGLIPtin (JANUVIA) 25 MG tablet Take 1 tablet (25 mg total) by mouth daily. 12/03/19   Trey Sailors,  PA-C    Allergies Patient has no known allergies.  Family History  Problem Relation Age of Onset  . Congestive Heart Failure Father        had AICD placed.   . Dementia Father   . Dementia Mother     Social History Social History   Tobacco Use  . Smoking status: Former Smoker    Packs/day: 1.50    Years: 57.00    Pack years: 85.50    Types: Cigarettes    Quit date: 2012    Years since quitting: 9.1  . Smokeless tobacco: Never Used  Substance Use Topics  . Alcohol use: No  . Drug use: Never    Review of Systems Constitutional: No fever/chills Eyes: No visual changes. ENT: No sore throat. Cardiovascular: Denies chest pain. Respiratory: Denies shortness of breath. Gastrointestinal: No abdominal pain.  No nausea, no vomiting.  No diarrhea.  No constipation. Genitourinary: Negative for  dysuria. Musculoskeletal: Negative for neck pain.  Negative for back pain. Integumentary: Negative for rash. Neurological: Negative for headaches, focal weakness or numbness.   ____________________________________________   PHYSICAL EXAM:  VITAL SIGNS: ED Triage Vitals  Enc Vitals Group     BP 12/10/19 0526 131/70     Pulse Rate 12/10/19 0526 (!) 57     Resp 12/10/19 0526 (!) 22     Temp 12/10/19 0530 97.9 F (36.6 C)     Temp Source 12/10/19 0530 Oral     SpO2 12/10/19 0526 100 %     Weight 12/10/19 0527 64 kg (141 lb)     Height 12/10/19 0527 1.753 m (5\' 9" )     Head Circumference --      Peak Flow --      Pain Score 12/10/19 0527 8     Pain Loc --      Pain Edu? --      Excl. in GC? --     Constitutional: Alert and oriented to self Eyes: Conjunctivae are normal.  Mouth/Throat: Patient is wearing a mask. Neck: No stridor.  No meningeal signs.   Cardiovascular: Normal rate, regular rhythm. Good peripheral circulation. Grossly normal heart sounds. Respiratory: Normal respiratory effort.  No retractions. Gastrointestinal: Soft and nontender. No distention.  Musculoskeletal: No lower extremity tenderness nor edema.  T12-L2 tenderness to palpation Neurologic:  Normal speech and language. No gross focal neurologic deficits are appreciated.  Skin:  Skin is warm, dry and intact. Psychiatric: Mood and affect are normal. Speech and behavior are normal.  ____________________________________________   LABS (all labs ordered are listed, but only abnormal results are displayed)  Labs Reviewed - No data to display ____________________________________________  EKG ED ECG REPORT I, [place] Salene Mohamud, the attending physician, personally viewed and interpreted this ECG.   Date: 12/10/2019  EKG Time: 5:25 AM  Rate: 58  Rhythm: Normal sinus rhythm with a left bundle branch block  Axis: Normal  Intervals: Normal  ST&T Change:  None  ______________________________  RADIOLOGY I, [place] 02/07/2020, personally viewed and evaluated these images (plain radiographs) as part of my medical decision making, as well as reviewing the written report by the radiologist.  ED MD interpretation:    Official radiology report(s): DG Thoracic Spine 2 View  Result Date: 12/10/2019 CLINICAL DATA:  79 year old male status post fall with pain. EXAM: THORACIC SPINE 2 VIEWS COMPARISON:  Chest radiographs 05/17/2019. FINDINGS: Thoracic segmentation appears to be normal. Mildly exaggerated thoracic kyphosis is stable since last year. Stable vertebral height and alignment.  Evidence of normal cervicothoracic junction alignment. Lower thoracic disc space loss and endplate spurring, perhaps with some levels of chronic interbody ankylosis. Posterior ribs appear intact. Visible chest and abdominal visceral contours within normal limits. IMPRESSION: 1. No acute osseous abnormality identified in the thoracic spine. 2.  Aortic Atherosclerosis (ICD10-I70.0). Electronically Signed   By: Genevie Ann M.D.   On: 12/10/2019 06:18   DG Lumbar Spine Complete  Result Date: 12/10/2019 CLINICAL DATA:  [place]. Back pain. EXAM: LUMBAR SPINE - COMPLETE 4+ VIEW COMPARISON:  CT scan 05/27/2019 FINDINGS: There is a fracture of the superior and anterior aspect of the L1 vertebral body without significant compression deformity. No obvious retropulsion. The other vertebral bodies are maintained. Advanced vascular calcifications without definite aneurysm. IMPRESSION: 1. L1 compression fracture without significant compression deformity or definite retropulsion. 2. Advanced vascular calcifications without definite aneurysm. Electronically Signed   By: Marijo Sanes M.D.   On: 12/10/2019 06:27   CT Head Wo Contrast  Result Date: 12/10/2019 CLINICAL DATA:  [place] and hit head. EXAM: CT HEAD WITHOUT CONTRAST TECHNIQUE: Contiguous axial images were obtained from the base of the skull through  the vertex without intravenous contrast. COMPARISON:  11/14/2019 FINDINGS: Brain: Stable age related cerebral atrophy, ventriculomegaly and periventricular white matter disease. No extra-axial fluid collections are identified. No CT findings for acute hemispheric infarction or intracranial hemorrhage. No mass lesions. The brainstem and cerebellum are normal. Vascular: Stable vascular calcifications. No hyperdense vessels or aneurysm. Skull: No skull fracture or bone lesions. Sinuses/Orbits: The paranasal sinuses and mastoid air cells are clear. The globes are intact. Other: No scalp lesions or hematoma. IMPRESSION: 1. Stable age related cerebral atrophy, ventriculomegaly and periventricular white matter disease. 2. No acute intracranial findings or skull fracture. Electronically Signed   By: Marijo Sanes M.D.   On: 12/10/2019 06:31   CT Cervical Spine Wo Contrast  Result Date: 12/10/2019 CLINICAL DATA:  [place] today. EXAM: CT CERVICAL SPINE WITHOUT CONTRAST TECHNIQUE: Multidetector CT imaging of the cervical spine was performed without intravenous contrast. Multiplanar CT image reconstructions were also generated. COMPARISON:  None. FINDINGS: Alignment: Normal Skull base and vertebrae: No acute fracture. No primary bone lesion or focal pathologic process. Soft tissues and spinal canal: No prevertebral fluid or swelling. No visible canal hematoma. Disc levels: Fairly generous spinal canal. No significant spinal stenosis. Mild multilevel foraminal stenosis due to uncinate spurring and facet disease. No facet or laminar fractures. Upper chest: 13 mm right apical lung lesion, unchanged since 2012. Other: Advanced carotid artery calcifications. IMPRESSION: 1. Degenerative cervical spondylosis with multilevel disc disease and facet disease. 2. No acute cervical spine fracture. Electronically Signed   By: Marijo Sanes M.D.   On: 12/10/2019 06:35     ____________________________________________   Procedures   ____________________________________________   INITIAL IMPRESSION / MDM / [place] / ED COURSE  As part of my medical decision making, I reviewed the following data within the electronic MEDICAL RECORD NUMBER  79 year old male presented with above-stated history and physical exam secondary to have mechanical fall.  Concern for possible intracranial versus spinal injury.  CT head revealed no acute intra cranial findings.  Lumbar spine x-ray does reveal an L1 compression fracture.  Patient given Percocet in the emergency department.  Prescribed same for home.     ____________________________________________  FINAL CLINICAL IMPRESSION(S) / ED DIAGNOSES  Final diagnoses:  Closed compression fracture of body of L1 vertebra ([place])     MEDICATIONS GIVEN DURING THIS VISIT:  Medications  oxyCODONE-acetaminophen (PERCOCET/ROXICET) 5-325 MG per tablet 1 tablet (1 tablet Oral Given 12/10/19 0720)     ED Discharge Orders    None      *Please note:  Kevin Case was evaluated in Emergency Department on 12/10/2019 for the symptoms described in the history of present illness. He was evaluated in the context of the global COVID-19 pandemic, which necessitated consideration that the patient might be at risk for infection with the SARS-CoV-2 virus that causes COVID-19. Institutional protocols and algorithms that pertain to the evaluation of patients at risk for COVID-19 are in a state of rapid change based on information released by regulatory bodies including the CDC and federal and state organizations. These policies and algorithms were followed during the patient's care in the ED.  Some ED evaluations and interventions may be delayed as a result of limited staffing during the pandemic.*  Note:  This document was prepared using Dragon voice recognition software and may include unintentional dictation errors.   Darci Current, MD 12/10/19 [id number]    Darci Current, MD 12/10/19 [phone]

## 2019-12-10 NOTE — ED Notes (Signed)
E-signature not working at this time. Pt and pt's wife verbalized understanding of D/C instructions, prescriptions and follow up care with no further questions at this time. Pt in NAD at time of D/C.

## 2019-12-14 ENCOUNTER — Encounter: Payer: Self-pay | Admitting: Physician Assistant

## 2019-12-15 ENCOUNTER — Ambulatory Visit (INDEPENDENT_AMBULATORY_CARE_PROVIDER_SITE_OTHER): Payer: Medicare Other | Admitting: Physician Assistant

## 2019-12-15 DIAGNOSIS — R634 Abnormal weight loss: Secondary | ICD-10-CM

## 2019-12-15 DIAGNOSIS — F0281 Dementia in other diseases classified elsewhere with behavioral disturbance: Secondary | ICD-10-CM | POA: Diagnosis not present

## 2019-12-15 DIAGNOSIS — G309 Alzheimer's disease, unspecified: Secondary | ICD-10-CM

## 2019-12-15 MED ORDER — DIVALPROEX SODIUM 125 MG PO DR TAB: 125.0000 mg | DELAYED_RELEASE_TABLET | Freq: Two times a day (BID) | ORAL | 0 refills | Status: AC

## 2019-12-15 NOTE — Telephone Encounter (Signed)
Called patients wife and schedule a visit for today, 12/15/2019 @ 2:20 PM.

## 2019-12-15 NOTE — Progress Notes (Signed)
Patient: Kevin Case Male    DOB: 1941/10/23   79 y.o.   MRN: [id number] Visit Date: 12/15/2019  Today's Provider: Trey Sailors, PA-C   Chief Complaint  Patient presents with  . Memory Loss  . Fall   Subjective:    Virtual Visit via Telephone Note  I connected with Kevin Case on 12/15/19 at  2:20 PM EST by telephone and verified that I am speaking with the correct person using two identifiers.  Location: Patient: Home Provider: Office   I discussed the limitations, risks, security and privacy concerns of performing an evaluation and management service by telephone and the availability of in person appointments. I also discussed with the patient that there may be a patient responsible charge related to this service. The patient expressed understanding and agreed to proceed.  HPI    Memory Loss & Fall Patient presents today via virtual visit for memory loss and fall. Patient was hospitalized on geriatric psychiatry unit at [facility] earlier this month for alzheimer's with behavioral disturbance. He was started on depakote ER 250 mg BID and remeron 7.5 mg QHS. He was discharged to home under the care of his wife.   Since then he has fallen a total of three times. Twice in the hallway which led to an ER visit on 12/10/2019 where imaging revealed lumbar compression fracture but otherwise no acute abnormality of his head. He was treated with narcotic pain medicine and discharged. Patient's wife reports he fell again today on his elbow.  Patient wife Kevin Case states that does have physical therapy comes out to help patient to learn how to walk with a walker and when the lady left today, 12/15/2019 patient had a fall. Donita states that patient hit his elbow and it was bleeding but she bandage it up. She states that patient seems to be forgetting to use the walker even when it is in front of him. She reports he walks well when he is using the walker but without it he  is very unstable. She reports he is awake and does not appear to be drowsy however she says he has declined significantly since the last office visit on 12/03/2019. She reports his weight is 112 lbs and not 142 lbs like previously stated in the office.  Wt Readings from Last 3 Encounters:  12/10/19 141 lb (64 kg)  12/03/19 142 lb 3.2 oz (64.5 kg)  11/14/19 158 lb (71.7 kg)    No Known Allergies   Current Outpatient Medications:  .  acetaminophen (TYLENOL) 500 MG tablet, Take 1,000 mg by mouth every 6 (six) hours as needed (headache.)., Disp: , Rfl:  .  aspirin EC 81 MG tablet, Take 1 tablet (81 mg total) by mouth daily., Disp: 150 tablet, Rfl: 2 .  atorvastatin (LIPITOR) 80 MG tablet, TAKE 1 TABLET BY MOUTH ONCE DAILY AT BEDTIME (Patient taking differently: Take 80 mg by mouth at bedtime. ), Disp: 30 tablet, Rfl: 5 .  clopidogrel (PLAVIX) 75 MG tablet, Take 1 tablet (75 mg total) by mouth daily., Disp: 90 tablet, Rfl: 0 .  divalproex (DEPAKOTE ER) 250 MG 24 hr tablet, Take 250 mg by mouth daily., Disp: , Rfl:  .  docusate sodium (COLACE) 100 MG capsule, Take 100 mg by mouth daily as needed for mild constipation. , Disp: , Rfl:  .  empagliflozin (JARDIANCE) 10 MG TABS tablet, Take 10 mg by mouth daily before breakfast., Disp: 90 tablet, Rfl: 0 .  levothyroxine (SYNTHROID) 25 MCG tablet, Take 25 mcg by mouth daily before breakfast., Disp: , Rfl:  .  Melatonin 10 MG TABS, Take 10 mg by mouth at bedtime. , Disp: , Rfl:  .  metFORMIN (GLUCOPHAGE) 500 MG tablet, Take 1 tablet (500 mg total) by mouth 2 (two) times daily with a meal., Disp: 180 tablet, Rfl: 1 .  mirtazapine (REMERON) 7.5 MG tablet, Take 7.5 mg by mouth at bedtime., Disp: , Rfl:  .  Multiple Vitamin (MULTIVITAMIN WITH MINERALS) TABS tablet, Take 1 tablet by mouth daily. , Disp: , Rfl:  .  Omega-3 Fatty Acids (FISH OIL) 1000 MG CAPS, Take 1,000 mg by mouth daily., Disp: , Rfl:  .  omeprazole (PRILOSEC) 40 MG capsule, Take 1 capsule (40  mg total) by mouth daily., Disp: 90 capsule, Rfl: 2 .  sitaGLIPtin (JANUVIA) 25 MG tablet, Take 1 tablet (25 mg total) by mouth daily., Disp: 90 tablet, Rfl: 0  Review of Systems  Constitutional: Negative.   HENT: Negative.   Respiratory: Negative.   Cardiovascular: Negative.   Gastrointestinal: Negative.   Hematological: Bruises/bleeds easily.    Social History   Tobacco Use  . Smoking status: Former Smoker    Packs/day: 1.50    Years: 57.00    Pack years: 85.50    Types: Cigarettes    Quit date: 2012    Years since quitting: 9.1  . Smokeless tobacco: Never Used  Substance Use Topics  . Alcohol use: No      Objective:   There were no vitals taken for this visit. There were no vitals filed for this visit.There is no height or weight on file to calculate BMI.   Physical Exam Constitutional:      Comments: Patient sounds sedated and words are slurred.       No results found for any visits on 12/15/19.     Assessment & Plan     1. Alzheimer's dementia with behavioral disturbance, unspecified timing of dementia onset [facility])  Concerned about multiple falls since discharge from hospital and addition of new sedating medicines. Will have him stop remeron which was started in geriatric psychiatry for sleep. Will reduce depakote for possible sedative effects. She reports he does appear awake and alert under direct supervision such as with his physical therapist this morning. He does have an appointment on 01/17/2020 with [facility]rology. Wife declines referral to psychiatry. Weight loss may be multifactorial, including due to memory loss or other process such as malignancy. He did have incidental lung nodule on chest CT and has follow up with pulmonology. Wife reports she has been introducing boost which he intermittently refuses. He continues to work with physical therapy. He will follow up with neurology or sooner with [place] if needed.   - divalproex (DEPAKOTE) 125 MG DR  tablet; Take 1 tablet (125 mg total) by mouth 2 (two) times daily.  Dispense: 180 tablet; Refill: 0  2. Weight loss   I discussed the assessment and treatment plan with the patient. The patient was provided an opportunity to ask questions and all were answered. The patient agreed with the plan and demonstrated an understanding of the instructions.   The patient was advised to call back or seek an in-person evaluation if the symptoms worsen or if the condition fails to improve as anticipated.  I provided 25 minutes of non-face-to-face time during this encounter.     Trinna Post, PA-C  [facility]

## 2019-12-15 NOTE — Patient Instructions (Signed)
Alzheimer Disease Caregiver Guide  Alzheimer disease causes a person to lose the ability to remember things and make decisions. A person who has Alzheimer disease may not be able to take care of himself or herself. He or she may need help with simple tasks. The tips below can help you care for the person. What kind of changes does this condition cause? This condition makes a person:  Forget things.  Feel confused.  Act differently.  Have different moods. These things get worse with time. Tips to help with symptoms  Be calm and patient.  Respond with a simple, short answer.  Avoid correcting the person in a negative way.  Try not to take things personally, even if the person forgets your name.  Do not argue with the person. This may make the person more upset. Tips to lessen frustration  Make appointments and do daily tasks when the person is at his or her best.  Take your time. Simple tasks may take longer. Allow plenty of time to complete tasks.  Limit choices for the person.  Involve the person in what you are doing.  Keep a daily routine.  Avoid new or crowded places, if possible.  Use simple words, short sentences, and a calm voice. Only give one direction at a time.  Buy clothes and shoes that are easy to put on and take off.  Organize medicines in a pillbox for each day of the week.  Keep a calendar in a central location to remind the person of meetings or other activities.  Let people help if they offer. Take a break when needed. Tips to prevent injury  Keep floors clear. Remove rugs, magazine racks, and floor lamps.  Keep hallways well-lit.  Put a handrail and non-slip mat in the bathtub or shower.  Put childproof locks on cabinets that have dangerous items in them. These items include medicine, alcohol, guns, toxic cleaning items, sharp tools, matches, and lighters.  Put locks on doors where the person cannot see or reach them. This helps the  person to not wander out of the house and get lost.  Be prepared for emergencies. Keep a list of emergency phone numbers and addresses close by.  Bracelets may be worn that track location and identify the person as having memory problems. This should be worn at all times for safety. Tips for the future  Discuss financial and legal planning early. People with this disease have trouble managing their money as the disease gets worse. Get help from a professional.  Talk about advance directives, safety, and daily care. Take these steps: ? Create a living will and choose a power of attorney. This is someone who can make decisions for the person with Alzheimer disease when he or she can no longer do so. ? Discuss driving safety and when to stop driving. The person's doctor can help with this. ? If the person lives alone, make sure he or she is safe. Some people need extra help at home. Other people need more care at a nursing home or care center. Where to find support You can find support by joining a support group near you. Some benefits of joining a support group include:  Learning ways to manage stress.  Sharing experiences with others.  Getting emotional comfort and support.  Learning about caregiving as the disease progresses.  Knowing what community resources are available and making use of them. Where to find more information  Alzheimer[employer] Association: www.alz.org Contact a doctor if:    The person has a fever.  The person has a sudden behavior change that does not get better with calming strategies.  The person is not able to take care of himself or herself at home.  The person threatens you or anyone else, including himself or herself.  You are no longer able to care for the person. Summary  Alzheimer disease causes a person to forget things and to be confused.  A person who has this condition may not be able to take care of himself or herself.  Take steps to keep the  person from getting hurt. Plan for future care.  You can find support by joining a support group near you. This information is not intended to replace advice given to you by your health care provider. Make sure you discuss any questions you have with your health care provider. Document Revised: 02/09/2019 Document Reviewed: 10/16/2017 Elsevier Patient Education  2020 [employer]vier Inc.  

## 2019-12-20 ENCOUNTER — Ambulatory Visit: Payer: Self-pay | Admitting: *Deleted

## 2019-12-20 ENCOUNTER — Other Ambulatory Visit: Payer: Self-pay

## 2019-12-20 ENCOUNTER — Encounter: Payer: Self-pay | Admitting: Emergency Medicine

## 2019-12-20 ENCOUNTER — Inpatient Hospital Stay
Admission: EM | Admit: 2019-12-20 | Discharge: 2020-01-03 | DRG: 871 | Disposition: E | Payer: Medicare Other | Attending: Internal Medicine | Admitting: Internal Medicine

## 2019-12-20 ENCOUNTER — Emergency Department: Payer: Medicare Other

## 2019-12-20 DIAGNOSIS — R778 Other specified abnormalities of plasma proteins: Secondary | ICD-10-CM | POA: Diagnosis not present

## 2019-12-20 DIAGNOSIS — I350 Nonrheumatic aortic (valve) stenosis: Secondary | ICD-10-CM | POA: Diagnosis present

## 2019-12-20 DIAGNOSIS — I69322 Dysarthria following cerebral infarction: Secondary | ICD-10-CM | POA: Diagnosis not present

## 2019-12-20 DIAGNOSIS — R0902 Hypoxemia: Secondary | ICD-10-CM

## 2019-12-20 DIAGNOSIS — M109 Gout, unspecified: Secondary | ICD-10-CM | POA: Diagnosis present

## 2019-12-20 DIAGNOSIS — K219 Gastro-esophageal reflux disease without esophagitis: Secondary | ICD-10-CM | POA: Diagnosis present

## 2019-12-20 DIAGNOSIS — I214 Non-ST elevation (NSTEMI) myocardial infarction: Secondary | ICD-10-CM | POA: Diagnosis present

## 2019-12-20 DIAGNOSIS — E785 Hyperlipidemia, unspecified: Secondary | ICD-10-CM | POA: Diagnosis present

## 2019-12-20 DIAGNOSIS — Z7984 Long term (current) use of oral hypoglycemic drugs: Secondary | ICD-10-CM

## 2019-12-20 DIAGNOSIS — A419 Sepsis, unspecified organism: Principal | ICD-10-CM | POA: Diagnosis present

## 2019-12-20 DIAGNOSIS — Z7189 Other specified counseling: Secondary | ICD-10-CM

## 2019-12-20 DIAGNOSIS — Z7982 Long term (current) use of aspirin: Secondary | ICD-10-CM

## 2019-12-20 DIAGNOSIS — J9601 Acute respiratory failure with hypoxia: Secondary | ICD-10-CM | POA: Diagnosis present

## 2019-12-20 DIAGNOSIS — Z20822 Contact with and (suspected) exposure to covid-19: Secondary | ICD-10-CM | POA: Diagnosis present

## 2019-12-20 DIAGNOSIS — E7800 Pure hypercholesterolemia, unspecified: Secondary | ICD-10-CM

## 2019-12-20 DIAGNOSIS — I428 Other cardiomyopathies: Secondary | ICD-10-CM | POA: Diagnosis present

## 2019-12-20 DIAGNOSIS — I5022 Chronic systolic (congestive) heart failure: Secondary | ICD-10-CM | POA: Diagnosis present

## 2019-12-20 DIAGNOSIS — E1122 Type 2 diabetes mellitus with diabetic chronic kidney disease: Secondary | ICD-10-CM | POA: Diagnosis present

## 2019-12-20 DIAGNOSIS — G309 Alzheimer's disease, unspecified: Secondary | ICD-10-CM | POA: Diagnosis present

## 2019-12-20 DIAGNOSIS — Z9861 Coronary angioplasty status: Secondary | ICD-10-CM

## 2019-12-20 DIAGNOSIS — R296 Repeated falls: Secondary | ICD-10-CM | POA: Diagnosis present

## 2019-12-20 DIAGNOSIS — I447 Left bundle-branch block, unspecified: Secondary | ICD-10-CM | POA: Diagnosis present

## 2019-12-20 DIAGNOSIS — R6521 Severe sepsis with septic shock: Secondary | ICD-10-CM | POA: Diagnosis present

## 2019-12-20 DIAGNOSIS — E1151 Type 2 diabetes mellitus with diabetic peripheral angiopathy without gangrene: Secondary | ICD-10-CM | POA: Diagnosis present

## 2019-12-20 DIAGNOSIS — I2510 Atherosclerotic heart disease of native coronary artery without angina pectoris: Secondary | ICD-10-CM | POA: Diagnosis present

## 2019-12-20 DIAGNOSIS — N1831 Chronic kidney disease, stage 3a: Secondary | ICD-10-CM | POA: Diagnosis present

## 2019-12-20 DIAGNOSIS — L8990 Pressure ulcer of unspecified site, unspecified stage: Secondary | ICD-10-CM | POA: Diagnosis present

## 2019-12-20 DIAGNOSIS — J690 Pneumonitis due to inhalation of food and vomit: Secondary | ICD-10-CM | POA: Diagnosis present

## 2019-12-20 DIAGNOSIS — Z66 Do not resuscitate: Secondary | ICD-10-CM | POA: Diagnosis present

## 2019-12-20 DIAGNOSIS — F418 Other specified anxiety disorders: Secondary | ICD-10-CM | POA: Diagnosis present

## 2019-12-20 DIAGNOSIS — Z515 Encounter for palliative care: Secondary | ICD-10-CM | POA: Diagnosis not present

## 2019-12-20 DIAGNOSIS — Z8711 Personal history of peptic ulcer disease: Secondary | ICD-10-CM

## 2019-12-20 DIAGNOSIS — Z8249 Family history of ischemic heart disease and other diseases of the circulatory system: Secondary | ICD-10-CM

## 2019-12-20 DIAGNOSIS — R0602 Shortness of breath: Secondary | ICD-10-CM

## 2019-12-20 DIAGNOSIS — I639 Cerebral infarction, unspecified: Secondary | ICD-10-CM | POA: Diagnosis present

## 2019-12-20 DIAGNOSIS — Z7902 Long term (current) use of antithrombotics/antiplatelets: Secondary | ICD-10-CM

## 2019-12-20 DIAGNOSIS — Z79890 Hormone replacement therapy (postmenopausal): Secondary | ICD-10-CM

## 2019-12-20 DIAGNOSIS — I252 Old myocardial infarction: Secondary | ICD-10-CM

## 2019-12-20 DIAGNOSIS — D689 Coagulation defect, unspecified: Secondary | ICD-10-CM | POA: Diagnosis present

## 2019-12-20 DIAGNOSIS — I255 Ischemic cardiomyopathy: Secondary | ICD-10-CM | POA: Diagnosis present

## 2019-12-20 DIAGNOSIS — M1990 Unspecified osteoarthritis, unspecified site: Secondary | ICD-10-CM | POA: Diagnosis present

## 2019-12-20 DIAGNOSIS — Z87891 Personal history of nicotine dependence: Secondary | ICD-10-CM

## 2019-12-20 DIAGNOSIS — E875 Hyperkalemia: Secondary | ICD-10-CM | POA: Diagnosis present

## 2019-12-20 DIAGNOSIS — N183 Chronic kidney disease, stage 3 unspecified: Secondary | ICD-10-CM | POA: Diagnosis present

## 2019-12-20 DIAGNOSIS — I351 Nonrheumatic aortic (valve) insufficiency: Secondary | ICD-10-CM | POA: Diagnosis not present

## 2019-12-20 DIAGNOSIS — I10 Essential (primary) hypertension: Secondary | ICD-10-CM | POA: Diagnosis not present

## 2019-12-20 DIAGNOSIS — F0280 Dementia in other diseases classified elsewhere without behavioral disturbance: Secondary | ICD-10-CM | POA: Diagnosis present

## 2019-12-20 DIAGNOSIS — Z79899 Other long term (current) drug therapy: Secondary | ICD-10-CM

## 2019-12-20 DIAGNOSIS — I130 Hypertensive heart and chronic kidney disease with heart failure and stage 1 through stage 4 chronic kidney disease, or unspecified chronic kidney disease: Secondary | ICD-10-CM | POA: Diagnosis present

## 2019-12-20 DIAGNOSIS — Z818 Family history of other mental and behavioral disorders: Secondary | ICD-10-CM

## 2019-12-20 LAB — CBC WITH DIFFERENTIAL/PLATELET
Abs Immature Granulocytes: 0.04 10*3/uL (ref 0.00–0.07)
Basophils Absolute: 0 10*3/uL (ref 0.0–0.1)
Basophils Relative: 0 %
Eosinophils Absolute: 0.1 10*3/uL (ref 0.0–0.5)
Eosinophils Relative: 1 %
HCT: 46.3 % (ref 39.0–52.0)
Hemoglobin: 15.4 g/dL (ref 13.0–17.0)
Immature Granulocytes: 1 %
Lymphocytes Relative: 26 %
Lymphs Abs: 2.3 10*3/uL (ref 0.7–4.0)
MCH: 33.3 pg (ref 26.0–34.0)
MCHC: 33.3 g/dL (ref 30.0–36.0)
MCV: 100.2 fL — ABNORMAL HIGH (ref 80.0–100.0)
Monocytes Absolute: 0.5 10*3/uL (ref 0.1–1.0)
Monocytes Relative: 6 %
Neutro Abs: 5.9 10*3/uL (ref 1.7–7.7)
Neutrophils Relative %: 66 %
Platelets: 202 10*3/uL (ref 150–400)
RBC: 4.62 MIL/uL (ref 4.22–5.81)
RDW: 13.5 % (ref 11.5–15.5)
WBC: 8.8 10*3/uL (ref 4.0–10.5)
nRBC: 0 % (ref 0.0–0.2)

## 2019-12-20 LAB — BLOOD GAS, VENOUS
Acid-Base Excess: 0.8 mmol/L (ref 0.0–2.0)
Bicarbonate: 25.3 mmol/L (ref 20.0–28.0)
O2 Saturation: 68.7 %
Patient temperature: 37
pCO2, Ven: 39 mmHg — ABNORMAL LOW (ref 44.0–60.0)
pH, Ven: 7.42 (ref 7.250–7.430)
pO2, Ven: 35 mmHg (ref 32.0–45.0)

## 2019-12-20 LAB — COMPREHENSIVE METABOLIC PANEL
ALT: 33 U/L (ref 0–44)
AST: 40 U/L (ref 15–41)
Albumin: 3.6 g/dL (ref 3.5–5.0)
Alkaline Phosphatase: 90 U/L (ref 38–126)
Anion gap: 8 (ref 5–15)
BUN: 34 mg/dL — ABNORMAL HIGH (ref 8–23)
CO2: 24 mmol/L (ref 22–32)
Calcium: 9.5 mg/dL (ref 8.9–10.3)
Chloride: 108 mmol/L (ref 98–111)
Creatinine, Ser: 1.27 mg/dL — ABNORMAL HIGH (ref 0.61–1.24)
GFR calc Af Amer: >60 mL/min (ref >60)
GFR calc non Af Amer: 55 mL/min — ABNORMAL LOW (ref >60)
Glucose, Bld: 181 mg/dL — ABNORMAL HIGH (ref 70–99)
Potassium: 5.3 mmol/L — ABNORMAL HIGH (ref 3.5–5.1)
Sodium: 140 mmol/L (ref 135–145)
Total Bilirubin: 0.4 mg/dL (ref 0.3–1.2)
Total Protein: 8.3 g/dL — ABNORMAL HIGH (ref 6.5–8.1)

## 2019-12-20 LAB — PROCALCITONIN: Procalcitonin: <0.1 ng/mL

## 2019-12-20 LAB — GLUCOSE, CAPILLARY: Glucose-Capillary: 154 mg/dL — ABNORMAL HIGH (ref 70–99)

## 2019-12-20 LAB — FERRITIN: Ferritin: 143 ng/mL (ref 24–336)

## 2019-12-20 LAB — BRAIN NATRIURETIC PEPTIDE: B Natriuretic Peptide: 138 pg/mL — ABNORMAL HIGH (ref 0.0–100.0)

## 2019-12-20 LAB — T4, FREE: Free T4: 0.91 ng/dL (ref 0.61–1.12)

## 2019-12-20 LAB — TROPONIN I (HIGH SENSITIVITY)
Troponin I (High Sensitivity): 26 ng/L — ABNORMAL HIGH (ref <18)
Troponin I (High Sensitivity): 83 ng/L — ABNORMAL HIGH (ref <18)
Troponin I (High Sensitivity): 1329 ng/L (ref <18)

## 2019-12-20 LAB — RESPIRATORY PANEL BY RT PCR (FLU A&B, COVID)
Influenza A by PCR: NEGATIVE
Influenza B by PCR: NEGATIVE
SARS Coronavirus 2 by RT PCR: NEGATIVE

## 2019-12-20 LAB — TSH: TSH: 4.313 u[IU]/mL (ref 0.350–4.500)

## 2019-12-20 LAB — POC SARS CORONAVIRUS 2 AG: SARS Coronavirus 2 Ag: NEGATIVE

## 2019-12-20 LAB — LACTATE DEHYDROGENASE: LDH: 188 U/L (ref 98–192)

## 2019-12-20 MED ORDER — METOPROLOL SUCCINATE ER 50 MG PO TB24
25.0000 mg | ORAL_TABLET | Freq: Every day | ORAL | Status: DC
  Filled 2019-12-20: qty 1

## 2019-12-20 MED ORDER — ONDANSETRON HCL 4 MG/2ML IJ SOLN: 4.0000 mg | Freq: Three times a day (TID) | INTRAMUSCULAR | Status: DC | PRN

## 2019-12-20 MED ORDER — IPRATROPIUM BROMIDE 0.02 % IN SOLN
0.5000 mg | RESPIRATORY_TRACT | Status: DC
  Administered 2019-12-20 – 2019-12-21 (×4): 0.5 mg via RESPIRATORY_TRACT
  Filled 2019-12-20 (×5): qty 2.5

## 2019-12-20 MED ORDER — HYDRALAZINE HCL 20 MG/ML IJ SOLN: 5.0000 mg | INTRAMUSCULAR | Status: DC | PRN

## 2019-12-20 MED ORDER — SODIUM CHLORIDE 0.9 % IV SOLN: INTRAVENOUS | Status: DC

## 2019-12-20 MED ORDER — SODIUM CHLORIDE 0.9 % IV BOLUS
500.0000 mL | Freq: Once | INTRAVENOUS | Status: AC
  Administered 2019-12-20: 500 mL via INTRAVENOUS

## 2019-12-20 MED ORDER — SODIUM CHLORIDE 0.9 % IV SOLN
3.0000 g | Freq: Once | INTRAVENOUS | Status: AC
  Administered 2019-12-20: 3 g via INTRAVENOUS
  Filled 2019-12-20: qty 8

## 2019-12-20 MED ORDER — ACETAMINOPHEN 650 MG RE SUPP: 650.0000 mg | Freq: Four times a day (QID) | RECTAL | Status: DC | PRN

## 2019-12-20 MED ORDER — LORAZEPAM 2 MG/ML IJ SOLN
0.5000 mg | Freq: Three times a day (TID) | INTRAMUSCULAR | Status: DC | PRN
  Filled 2019-12-20: qty 1

## 2019-12-20 MED ORDER — SODIUM CHLORIDE 0.9 % IV SOLN
3.0000 g | Freq: Three times a day (TID) | INTRAVENOUS | Status: DC
  Administered 2019-12-20 – 2019-12-21 (×2): 3 g via INTRAVENOUS
  Filled 2019-12-20: qty 8
  Filled 2019-12-20: qty 3
  Filled 2019-12-20 (×2): qty 8

## 2019-12-20 MED ORDER — IOHEXOL 350 MG/ML SOLN
75.0000 mL | Freq: Once | INTRAVENOUS | Status: AC | PRN
  Administered 2019-12-20: 15:00:00 75 mL via INTRAVENOUS

## 2019-12-20 MED ORDER — LEVOTHYROXINE SODIUM 100 MCG/5ML IV SOLN
12.5000 ug | Freq: Every day | INTRAVENOUS | Status: DC
  Administered 2019-12-21: 06:00:00 12.5 ug via INTRAVENOUS
  Filled 2019-12-20: qty 5

## 2019-12-20 MED ORDER — FAMOTIDINE IN NACL 20-0.9 MG/50ML-% IV SOLN
20.0000 mg | Freq: Two times a day (BID) | INTRAVENOUS | Status: DC
  Administered 2019-12-20 – 2019-12-21 (×2): 20 mg via INTRAVENOUS
  Filled 2019-12-20 (×2): qty 50

## 2019-12-20 MED ORDER — ENOXAPARIN SODIUM 40 MG/0.4ML ~~LOC~~ SOLN
40.0000 mg | SUBCUTANEOUS | Status: DC
  Administered 2019-12-20: 22:00:00 40 mg via SUBCUTANEOUS
  Filled 2019-12-20: qty 0.4

## 2019-12-20 MED ORDER — METOPROLOL TARTRATE 5 MG/5ML IV SOLN
5.0000 mg | Freq: Once | INTRAVENOUS | Status: AC
  Administered 2019-12-20: 5 mg via INTRAVENOUS
  Filled 2019-12-20: qty 5

## 2019-12-20 MED ORDER — INSULIN ASPART 100 UNIT/ML ~~LOC~~ SOLN
0.0000 [IU] | Freq: Three times a day (TID) | SUBCUTANEOUS | Status: DC
  Administered 2019-12-21: 1 [IU] via SUBCUTANEOUS
  Filled 2019-12-20: qty 1

## 2019-12-20 MED ORDER — INSULIN ASPART 100 UNIT/ML ~~LOC~~ SOLN: 0.0000 [IU] | Freq: Every day | SUBCUTANEOUS | Status: DC

## 2019-12-20 MED ORDER — ALBUTEROL SULFATE (2.5 MG/3ML) 0.083% IN NEBU: 2.5000 mg | INHALATION_SOLUTION | RESPIRATORY_TRACT | Status: DC | PRN

## 2019-12-20 MED ORDER — SODIUM ZIRCONIUM CYCLOSILICATE 5 G PO PACK
5.0000 g | PACK | Freq: Once | ORAL | Status: DC
  Filled 2019-12-20: qty 1

## 2019-12-20 NOTE — Consult Note (Signed)
Name: Kevin Case MRN: [id number] DOB: 06-Jan-1941    ADMISSION DATE:  12/06/2019 CONSULTATION DATE:  12/16/2019  REFERRING MD :  Dr. Clyde Lundborg  CHIEF COMPLAINT:  Acute Respiratory Distress  BRIEF PATIENT DESCRIPTION:  79 year old male with past medical history notable for HFrEF (EF 30-35%), CVA with dysarthria, dementia, CKD stage III, CAD, left bundle branch block, hypertension, aortic stenosis, diabetes mellitus, anemia who was admitted to stepdown unit on 12/24/2019 for acute hypoxic respiratory failure secondary to aspiration pneumonia.  SIGNIFICANT EVENTS  2/15-admission to stepdown 2/15-PCCM consulted  STUDIES:  2/15-chest x-ray>>1. Increased interstitial opacities at the lung bases could reflect mild interstitial edema or atypical infection such as viral Pneumonia. 2/15-CTA chest>>No evidence of pulmonary emboli. Mild reticulonodular infiltrate in the bases bilaterally which may be related to aspiration. Stable nodular density in the right upper lobe unchanged from 2012 consistent with a benign etiology. L1 compression deformity similar to that seen on recent plain film but new from prior CT examination. Aortic Atherosclerosis  CULTURES: 2/15-SARS-CoV-2 PCR>> negative 2/15-influenza PCR>> negative 2/15-blood culture x2>> 2/15-sputum culture>> 2/15-strep pneumo urinary antigen>> 2/15-Legionella urinary antigen>>  ANTIBIOTICS: Unasyn 2/15>>  HISTORY OF PRESENT ILLNESS:   Kevin Case is a 79 year old male with a past medical history notable for stroke with dysarthria, dementia, HFrEF (EF 30-35%), hypertension, hyperlipidemia, aortic stenosis, diabetes mellitus, GERD, gout, PAD, CAD, CKD stage III who presents to [facility] ED on 12/30/2019 due to progressive generalized weakness for approximately 3 weeks.  His wife reports that he is no longer able to walk (previously dependent for most of his ADLs) and has poor p.o. intake.  His wife also reports he had an episode yesterday of vomiting  after she fed him, along with cough and shortness of breath.  Patient currently has no nausea, vomiting, diarrhea, abdominal pain, or dysuria.  Upon EMS arrival he was noted to be hypoxic with O2 sats 85% on room air with labored respirations.  Upon presentation to the ED he is noted to be afebrile, blood pressure 186/87, tachycardic, tachypneic, with O2 saturations 94% on 6 L nasal cannula.  Initial work-up in the ED revealed WBC 8.8, BNP 138, high-sensitivity troponin 26, BUN 34, creatinine 1.27, potassium 5.3.  His Covid-19 PCR is negative, influenza PCR is negative.  Chest x-ray with bilateral basilar opacities concerning for edema versus atypical infection.  CTA chest obtained which was negative for PE, did reveal bilateral infiltrates concerning for aspiration pneumonia.  He is being admitted to the stepdown unit by the hospitalist for further work-up and treatment of acute hypoxic respiratory failure secondary to aspiration pneumonia.  PCCM is consulted for further management.  PAST MEDICAL HISTORY :   has a past medical history of Anemia, Blood transfusion without reported diagnosis, CAD (coronary artery disease), Carotid arterial disease (HCC), CHF (congestive heart failure) (HCC), Clotting disorder (HCC), Diabetes mellitus without complication (HCC), GIB (gastrointestinal bleeding), Heart murmur, HFrEF (heart failure with reduced ejection fraction) (HCC), History of tobacco abuse, Hypertension, Ischemic cardiomyopathy, Left bundle branch block, Moderate aortic stenosis, PAD (peripheral artery disease) (HCC), Rib fracture, Sinus bradycardia, and Subclavian arterial stenosis (HCC).  has a past surgical history that includes RIGHT/LEFT HEART CATH AND CORONARY ANGIOGRAPHY (N/A, 03/15/2019); Cardiac catheterization; AORTIC ARCH ANGIOGRAPHY (N/A, 03/31/2019); PERIPHERAL VASCULAR INTERVENTION (Left, 03/31/2019); Esophagogastroduodenoscopy (egd) with propofol (N/A, 05/27/2019); VISCERAL ANGIOGRAPHY (N/A,  05/28/2019); Lower Extremity Angiography (Right, 08/04/2019); Coronary angioplasty; and Esophagogastroduodenoscopy (egd) with propofol (N/A, 10/11/2019). Prior to Admission medications   Medication Sig Start Date End Date Taking? Authorizing Provider  acetaminophen (TYLENOL) 500 MG tablet Take 1,000 mg by mouth every 6 (six) hours as needed (headache.).   Yes [provider]  aspirin EC 81 MG tablet Take 1 tablet (81 mg total) by mouth daily. 08/04/19  Yes Dew, Erskine Squibb, MD  atorvastatin (LIPITOR) 80 MG tablet TAKE 1 TABLET BY MOUTH ONCE DAILY AT BEDTIME Patient taking differently: Take 80 mg by mouth at bedtime.  10/18/19  Yes Wellington Hampshire, MD  clopidogrel (PLAVIX) 75 MG tablet Take 1 tablet (75 mg total) by mouth daily. 12/06/19  Yes Wellington Hampshire, MD  divalproex (DEPAKOTE) 125 MG DR tablet Take 1 tablet (125 mg total) by mouth 2 (two) times daily. 12/15/19 03/14/20 Yes Trinna Post, PA-C  levothyroxine (SYNTHROID) 25 MCG tablet Take 25 mcg by mouth daily before breakfast.   Yes [provider]  LORazepam (ATIVAN) 1 MG tablet Take 1 mg by mouth daily as needed for anxiety. 11/27/19  Yes [provider]  losartan (COZAAR) 25 MG tablet Take 12.5 mg by mouth daily. 12/11/19  Yes [provider]  Melatonin 3 MG TABS Take 3 mg by mouth at bedtime.    Yes [provider]  metFORMIN (GLUCOPHAGE) 500 MG tablet Take 1 tablet (500 mg total) by mouth 2 (two) times daily with a meal. 12/07/19  Yes Terrilee Croak, Adriana M, PA-C  metoprolol succinate (TOPROL-XL) 25 MG 24 hr tablet Take 25 mg by mouth daily. 11/27/19  Yes [provider]  mirtazapine (REMERON) 7.5 MG tablet Take 7.5 mg by mouth at bedtime.   Yes [provider]  Multiple Vitamin (MULTIVITAMIN WITH MINERALS) TABS tablet Take 1 tablet by mouth daily.    Yes [provider]  Omega-3 Fatty Acids (FISH OIL) 1000 MG CAPS Take 1,000 mg by mouth daily.   Yes [provider]    omeprazole (PRILOSEC) 40 MG capsule Take 1 capsule (40 mg total) by mouth daily. 10/11/19 01/09/20 Yes Vanga, Tally Due, MD   No Known Allergies  FAMILY HISTORY:  family history includes Congestive Heart Failure in his father; Dementia in his father and mother. SOCIAL HISTORY:  reports that he quit smoking about 9 years ago. His smoking use included cigarettes. He has a 85.50 pack-year smoking history. He has never used smokeless tobacco. He reports that he does not drink alcohol or use drugs.  REVIEW OF SYSTEMS:  Unable to assess pt nonverbal at this time   SUBJECTIVE: Unable to assess pt nonverbal at this time   VITAL SIGNS: Temp:  [98.5 F (36.9 C)] 98.5 F (36.9 C) (02/15 1101) Pulse Rate:  [96-128] 105 (02/15 2008) Resp:  [30-44] 44 (02/15 2008) BP: (124-186)/(63-87) 124/63 (02/15 2005) SpO2:  [90 %-97 %] 96 % (02/15 2008) Weight:  [63 kg] 63 kg (02/15 1102)  PHYSICAL EXAMINATION: General: chronically ill appearing male resting in bed  Neuro: nonverbal at this time, alert/follows commands, PERRL  HEENT: supple, no JVD  Cardiovascular: sinus rhythm with depressed T wave, no R/G Lungs: faint crackles throughout, slightly tachypneic, and labored  Abdomen: +BS x4, soft, non distended, non tender Musculoskeletal: normal bulk and tone, no edema  Skin: reddened sacral spine, scattered abrasions bilateral upper and lower extremities   Recent Labs  Lab 12/26/2019 1137  NA 140  K 5.3*  CL 108  CO2 24  BUN 34*  CREATININE 1.27*  GLUCOSE 181*   Recent Labs  Lab 12/18/2019 1137  HGB 15.4  HCT 46.3  WBC 8.8  PLT 202  CT Angio Chest PE W/Cm &/Or Wo Cm  Result Date: 12/07/2019 CLINICAL DATA:  Weakness EXAM: CT ANGIOGRAPHY CHEST WITH CONTRAST TECHNIQUE: Multidetector CT imaging of the chest was performed using the standard protocol during bolus administration of intravenous contrast. Multiplanar CT image reconstructions and MIPs were obtained to evaluate the vascular  anatomy. CONTRAST:  31mL OMNIPAQUE IOHEXOL 350 MG/ML SOLN COMPARISON:  08/07/2011 CT, 05/27/2019 CT, plain film from 12/10/2019 FINDINGS: Cardiovascular: Atherosclerotic calcifications of the thoracic aorta are noted without aneurysmal dilatation. Coronary calcifications are seen. Pulmonary artery demonstrates a normal branching pattern without definitive pulmonary emboli. No pericardial effusion is seen. Mediastinum/Nodes: Thoracic inlet is within normal limits. No hilar or mediastinal adenopathy is noted. The esophagus as visualized is within normal limits. Lungs/Pleura: Lungs are well aerated bilaterally. Mild emphysematous changes are seen. Stable scarring/nodularity is noted in the right upper lobe best seen on image number 13 of series 6. This is stable from the prior exam of 2012 and felt to be benign in etiology. Mild patchy reticulonodular infiltrate is noted in the bases bilaterally. This could be related to aspiration. Upper Abdomen: Visualized upper abdomen is within normal limits. Musculoskeletal: Degenerative changes of the thoracic spine are noted. Superior compression deformity of L1 is seen. This is stable from the recent plain film examination but new from a prior exam of 05/27/2019 Review of the MIP images confirms the above findings. IMPRESSION: No evidence of pulmonary emboli. Mild reticulonodular infiltrate in the bases bilaterally which may be related to aspiration. Stable nodular density in the right upper lobe unchanged from 2012 consistent with a benign etiology. L1 compression deformity similar to that seen on recent plain film but new from prior CT examination. Aortic Atherosclerosis (ICD10-I70.0). Electronically Signed   By: Alcide Clever M.D.   On: 12/28/2019 15:02   DG Chest Port 1 View  Result Date: 12/27/2019 CLINICAL DATA:  Hypoxia. EXAM: PORTABLE CHEST 1 VIEW COMPARISON:  Chest x-ray dated June 09, 2019. FINDINGS: Macro cardiac increased interstitial opacities at the lung  bases. No focal consolidation, pleural effusion, or pneumothorax. No acute osseous abnormality. IMPRESSION: 1. Increased interstitial opacities at the lung bases could reflect mild interstitial edema or atypical infection such as viral pneumonia. Electronically Signed   By: Obie Dredge M.D.   On: 12/21/2019 11:24    ASSESSMENT / PLAN:  Acute hypoxic respiratory failure secondary to aspiration pneumonia -Supplemental O2 as needed to maintain O2 sats greater than 92% -Follow intermittent chest x-ray and ABG as needed -Chest x-ray and CTA chest on 2/15 consistent with aspiration pneumonia -IV Unasyn -As needed bronchodilators -High risk for intubation -Keep n.p.o. for now, pending speech eval  Aspiration pneumonia -Monitor fever curve -Trend WBCs and procalcitonin -Follow cultures as above -Continue IV Unasyn  Elevated troponin, in setting of demand ischemia versus NSTEMI Chronic HFrEF without acute exacerbation Hx: HFrEF (EF 30-35% on 03/13/2019), hypertension, MI, aortic stenosis -Continuous cardiac monitoring -Maintain MAP greater than 65 -Cautious IV fluids given CHF history -Vasopressors if needed to maintain MAP goal -Consult cardiology, appreciate input -Trend troponin's  -Hesitant to start heparin gtt at this time due to hx of GI secondary to anticoagulation, gross hematuria, and blood clotting disorder   CKD stage III (baseline creatinine 1.0-1.2) Hyperkalemia -Monitor I&O's / urinary output -Follow BMP -Ensure adequate renal perfusion -Avoid nephrotoxic agents as able -Replace electrolytes as indicated -Cautious IV fluids -Received Lokelma, repeat BMP in am -Continuous cardiac monitoring  Diabetes mellitus -CBGs -Sliding scale insulin -Follow ICU's hypo/hyperglycemia protocol  Will place palliative consult to discuss goals of treatment with pts wife.  DISPOSITION: Stepdown GOALS OF CARE: Full code VTE PROPHYLAXIS: Subcu Lovenox STRESS ULCER PROPHYLAXIS: IV  PEPCID UPDATES:  HIGH RISK FOR INTUBATION  PT'S PROGNOSIS IS GUARDED, HE IS HIGH RISK FOR CARDIAC ARREST AND DEATH.   Sonda Rumble, AGNP  Pulmonary/Critical Care Pager [phone] (please enter 7 digits) PCCM Consult Pager [phone] (please enter 7 digits)
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
## 2019-12-20 NOTE — H&P (Addendum)
History and Physical    Kevin Case ULA:[id number] DOB: February 17, 1941 DOA: 2020-01-04  Referring MD/NP/PA:   PCP: Kevin Sailors, PA-C   Patient coming from:  The patient is coming from home.  At baseline, pt is dependent for most of ADL.        Chief Complaint: Generalized weakness and shortness of breath  HPI: Kevin Case is a 79 y.o. male with medical history significant of hypertension, hyperlipidemia, diabetes mellitus, stroke with dysarthria, GERD, gout, depression with anxiety, subclavian artery stenosis, PVD, left bundle blockade, tobacco abuse, sCHF with EF 30-35%, GI bleeding, CAD, myocardial infarction, CKD--3, dementia, who presents with generalized weakness and shortness breath.  Per patient's wife, patient has been having generalized weakness for more than 3 weeks, which has been progressively getting worse.  He used to be able to walk, but cannot walk anymore.  Patient has physical therapy coming to his home, without significant help.  Patient also has cough, shortness of breath, no chest pain, fever or chills.  Wife states that yesterday he vomited once after she fed patient food.  Currently no nausea, vomiting, diarrhea or abdominal pain, no symptoms of UTI.  No facial droop or slurred speech.  ED Course: pt was found to have WBC 8.8, troponin 26 -->83, BUN 138, negative Covid PCR, slightly worsening renal function, potassium 5.3, temperature normal, blood pressure 186/87, tachycardia, tachypnea, oxygen saturation 94% on 6 L nasal cannula oxygen.  Chest x-ray showed interstitial prominence.  CT angiogram is negative for PE, but showed mild reticulonodular infiltrate in the bases bilaterally which may be related to aspiration. Pt is admitted to SDU bed as inpatient. PCCM, Dr. Belia Heman is consulted.   Review of Systems: Could not be reviewed due to dementia and dysarthria.   Allergy: No Known Allergies  Past Medical History:  Diagnosis Date  . Anemia   . Blood  transfusion without reported diagnosis   . CAD (coronary artery disease)    a. 03/2019 Cath: LM nl, LAD 50p CA2+, 28m, D1 50ost, LCX 20p, 64m, RCA small, mild diff dzs-->Med rx. Consider PCI LCX for refractory angina.  . Carotid arterial disease (HCC)    a. 03/2019 Carotid U/S: RICA 1-39%, RECA <50%, LICA 1-39%, LECA <50%.  . CHF (congestive heart failure) (HCC)   . Clotting disorder (HCC)   . Diabetes mellitus without complication (HCC)   . GIB (gastrointestinal bleeding)    a. 03/2019 BRBPR following PV procedure req anticoagulation-->2u PRBCs.  . Heart murmur   . HFrEF (heart failure with reduced ejection fraction) (HCC)    a. 03/2019 Echo: EF 30-35%, mild conc LVH. Mildly dil LA. Mod MV annular dil. Mod AS (mean grad , Valve area 0.76).  Marland Kitchen History of tobacco abuse   . Hypertension   . Ischemic cardiomyopathy    a. 03/2019 Echo: EF 30-35%.  . Left bundle branch block   . Moderate aortic stenosis    a. 03/2019 Echo:  Mod AS (mean grad , Valve area 0.76).  Marland Kitchen PAD (peripheral artery disease) (HCC)    a. 03/2019 PV Angio: RCFA 80, RSFA 100.  . Rib fracture   . Sinus bradycardia   . Subclavian arterial stenosis (HCC)    a. 03/2019 PV Angio: RSCA 90 after origin of CCA. RCCA 30ost, LSCA 80 (PTA and covered stenting).    Past Surgical History:  Procedure Laterality Date  . AORTIC ARCH ANGIOGRAPHY N/A 03/31/2019   Procedure: AORTIC ARCH ANGIOGRAPHY;  Surgeon: Iran Ouch, MD;  Location: [facility]  INVASIVE CV LAB;  Service: Cardiovascular;  Laterality: N/A;  . CARDIAC CATHETERIZATION    . CORONARY ANGIOPLASTY    . ESOPHAGOGASTRODUODENOSCOPY (EGD) WITH PROPOFOL N/A 05/27/2019   Procedure: ESOPHAGOGASTRODUODENOSCOPY (EGD) WITH PROPOFOL;  Surgeon: Toney Reil, MD;  Location: [facility] ENDOSCOPY;  Service: Gastroenterology;  Laterality: N/A;  . ESOPHAGOGASTRODUODENOSCOPY (EGD) WITH PROPOFOL N/A 10/11/2019   Procedure: ESOPHAGOGASTRODUODENOSCOPY (EGD) WITH PROPOFOL;  Surgeon: Toney Reil, MD;  Location: [facility] ENDOSCOPY;  Service: Gastroenterology;  Laterality: N/A;  . LOWER EXTREMITY ANGIOGRAPHY Right 08/04/2019   Procedure: LOWER EXTREMITY ANGIOGRAPHY;  Surgeon: Annice Needy, MD;  Location: [facility] INVASIVE CV LAB;  Service: Cardiovascular;  Laterality: Right;  . PERIPHERAL VASCULAR INTERVENTION Left 03/31/2019   Procedure: PERIPHERAL VASCULAR INTERVENTION;  Surgeon: Iran Ouch, MD;  Location: [facility] INVASIVE CV LAB;  Service: Cardiovascular;  Laterality: Left;  . RIGHT/LEFT HEART CATH AND CORONARY ANGIOGRAPHY N/A 03/15/2019   Procedure: RIGHT/LEFT HEART CATH AND CORONARY ANGIOGRAPHY;  Surgeon: Iran Ouch, MD;  Location: [facility] INVASIVE CV LAB;  Service: Cardiovascular;  Laterality: N/A;  . VISCERAL ANGIOGRAPHY N/A 05/28/2019   Procedure: VISCERAL ANGIOGRAPHY;  Surgeon: Annice Needy, MD;  Location: [facility] INVASIVE CV LAB;  Service: Cardiovascular;  Laterality: N/A;    Social History:  reports that he quit smoking about 9 years ago. His smoking use included cigarettes. He has a 85.50 pack-year smoking history. He has never used smokeless tobacco. He reports that he does not drink alcohol or use drugs.  Family History:  Family History  Problem Relation Age of Onset  . Congestive Heart Failure Father        had AICD placed.   . Dementia Father   . Dementia Mother      Prior to Admission medications   Medication Sig Start Date End Date Taking? Authorizing Provider  acetaminophen (TYLENOL) 500 MG tablet Take 1,000 mg by mouth every 6 (six) hours as needed (headache.).   Yes [provider]  aspirin EC 81 MG tablet Take 1 tablet (81 mg total) by mouth daily. 08/04/19  Yes Dew, Marlow Baars, MD  atorvastatin (LIPITOR) 80 MG tablet TAKE 1 TABLET BY MOUTH ONCE DAILY AT BEDTIME Patient taking differently: Take 80 mg by mouth at bedtime.  10/18/19  Yes Iran Ouch, MD  clopidogrel (PLAVIX) 75 MG tablet Take 1 tablet (75 mg total) by mouth daily. 12/06/19  Yes  Iran Ouch, MD  divalproex (DEPAKOTE) 125 MG DR tablet Take 1 tablet (125 mg total) by mouth 2 (two) times daily. 12/15/19 03/14/20 Yes Kevin Sailors, PA-C  levothyroxine (SYNTHROID) 25 MCG tablet Take 25 mcg by mouth daily before breakfast.   Yes [provider]  LORazepam (ATIVAN) 1 MG tablet Take 1 mg by mouth daily as needed for anxiety. 11/27/19  Yes [provider]  losartan (COZAAR) 25 MG tablet Take 12.5 mg by mouth daily. 12/11/19  Yes [provider]  Melatonin 3 MG TABS Take 3 mg by mouth at bedtime.    Yes [provider]  metFORMIN (GLUCOPHAGE) 500 MG tablet Take 1 tablet (500 mg total) by mouth 2 (two) times daily with a meal. 12/07/19  Yes Jodi Marble, Adriana M, PA-C  metoprolol succinate (TOPROL-XL) 25 MG 24 hr tablet Take 25 mg by mouth daily. 11/27/19  Yes [provider]  mirtazapine (REMERON) 7.5 MG tablet Take 7.5 mg by mouth at bedtime.   Yes [provider]  Multiple Vitamin (MULTIVITAMIN WITH MINERALS) TABS tablet Take 1 tablet  by mouth daily.    Yes [provider]  Omega-3 Fatty Acids (FISH OIL) 1000 MG CAPS Take 1,000 mg by mouth daily.   Yes [provider]  omeprazole (PRILOSEC) 40 MG capsule Take 1 capsule (40 mg total) by mouth daily. 10/11/19 01/09/20 Yes Toney Reil, MD    Physical Exam: Vitals:   2020-01-01 1600 January 01, 2020 1730 Jan 01, 2020 1800 01-01-20 1830  BP: (!) 175/75 (!) 145/74 136/71 139/66  Pulse: 97 (!) 106 (!) 105 (!) 106  Resp: (!) 32  (!) 40 (!) 35  Temp:      TempSrc:      SpO2: 94% 90% 95% 93%  Weight:      Height:       General: Not in acute distress HEENT:       Eyes: PERRL, EOMI, no scleral icterus.       ENT: No discharge from the ears and nose      Neck: No JVD, no bruit, no mass felt. Heme: No neck lymph node enlargement. Cardiac: S1/S2, RRR, No murmurs, No gallops or rubs. Respiratory: has coarse breathing sounds, tachypnea and labored breathing GI: Soft,  nondistended, nontender, no organomegaly, BS present. GU: No hematuria Ext: No pitting leg edema bilaterally. 1+DP/PT pulse bilaterally. Musculoskeletal: No joint deformities, No joint redness or warmth, no limitation of ROM in spin. Skin: No rashes.  Neuro: has dysarthria, seems to response to calling his name, cranial nerves II-XII grossly intact, moves all extremities slightly upon painful stimuli.   Psych: Patient is not psychotic, no suicidal or hemocidal ideation.  Labs on Admission: I have personally reviewed following labs and imaging studies  CBC: Recent Labs  Lab 01/01/2020 1137  WBC 8.8  NEUTROABS 5.9  HGB 15.4  HCT 46.3  MCV 100.2*  PLT 202   Basic Metabolic Panel: Recent Labs  Lab Jan 01, 2020 1137  NA 140  K 5.3*  CL 108  CO2 24  GLUCOSE 181*  BUN 34*  CREATININE 1.27*  CALCIUM 9.5   GFR: Estimated Creatinine Clearance: 44.1 mL/min (A) (by C-G formula based on SCr of 1.27 mg/dL (H)). Liver Function Tests: Recent Labs  Lab 2020/01/01 1137  AST 40  ALT 33  ALKPHOS 90  BILITOT 0.4  PROT 8.3*  ALBUMIN 3.6   No results for input(s): LIPASE, AMYLASE in the last 168 hours. No results for input(s): AMMONIA in the last 168 hours. Coagulation Profile: No results for input(s): INR, PROTIME in the last 168 hours. Cardiac Enzymes: No results for input(s): CKTOTAL, CKMB, CKMBINDEX, TROPONINI in the last 168 hours. BNP (last 3 results) No results for input(s): PROBNP in the last 8760 hours. HbA1C: No results for input(s): HGBA1C in the last 72 hours. CBG: No results for input(s): GLUCAP in the last 168 hours. Lipid Profile: No results for input(s): CHOL, HDL, LDLCALC, TRIG, CHOLHDL, LDLDIRECT in the last 72 hours. Thyroid Function Tests: Recent Labs    2020-01-01 1602  TSH 4.313  FREET4 0.91   Anemia Panel: Recent Labs    01-Jan-2020 1602  FERRITIN 143   Urine analysis:    Component Value Date/Time   COLORURINE YELLOW (A) 11/14/2019 2246   APPEARANCEUR  CLEAR (A) 11/14/2019 2246   LABSPEC 1.021 11/14/2019 2246   PHURINE 5.0 11/14/2019 2246   GLUCOSEU NEGATIVE 11/14/2019 2246   HGBUR NEGATIVE 11/14/2019 2246   BILIRUBINUR NEGATIVE 11/14/2019 2246   KETONESUR NEGATIVE 11/14/2019 2246   PROTEINUR 100 (A) 11/14/2019 2246   NITRITE NEGATIVE 11/14/2019 2246  LEUKOCYTESUR NEGATIVE 11/14/2019 2246   Sepsis Labs: @LABRCNTIP (procalcitonin:4,lacticidven:4) ) Recent Results (from the past 240 hour(s))  Respiratory Panel by RT PCR (Flu A&B, Covid) - Nasopharyngeal Swab     Status: None   Collection Time: 01/01/20  1:28 PM   Specimen: Nasopharyngeal Swab  Result Value Ref Range Status   SARS Coronavirus 2 by RT PCR NEGATIVE NEGATIVE Final    Comment: (NOTE) SARS-CoV-2 target nucleic acids are NOT DETECTED. The SARS-CoV-2 RNA is generally detectable in upper respiratoy specimens during the acute phase of infection. The lowest concentration of SARS-CoV-2 viral copies this assay can detect is 131 copies/mL. A negative result does not preclude SARS-Cov-2 infection and should not be used as the sole basis for treatment or other patient management decisions. A negative result may occur with  improper specimen collection/handling, submission of specimen other than nasopharyngeal swab, presence of viral mutation(s) within the areas targeted by this assay, and inadequate number of viral copies (<131 copies/mL). A negative result must be combined with clinical observations, patient history, and epidemiological information. The expected result is Negative. Fact Sheet for Patients:  https://www.moore.com/ Fact Sheet for Healthcare Providers:  https://www.young.biz/ This test is not yet ap proved or cleared by the [place] FDA and  has been authorized for detection and/or diagnosis of SARS-CoV-2 by FDA under an Emergency Use Authorization (EUA). This EUA will remain  in effect (meaning this test can be used)  for the duration of the COVID-19 declaration under Section 564(b)(1) of the Act, 21 U.S.C. section 360bbb-3(b)(1), unless the authorization is terminated or revoked sooner.    Influenza A by PCR NEGATIVE NEGATIVE Final   Influenza B by PCR NEGATIVE NEGATIVE Final    Comment: (NOTE) The Xpert Xpress SARS-CoV-2/FLU/RSV assay is intended as an aid in  the diagnosis of influenza from Nasopharyngeal swab specimens and  should not be used as a sole basis for treatment. Nasal washings and  aspirates are unacceptable for Xpert Xpress SARS-CoV-2/FLU/RSV  testing. Fact Sheet for Patients: https://www.moore.com/ Fact Sheet for Healthcare Providers: https://www.young.biz/ This test is not yet approved or cleared by the [place] FDA and  has been authorized for detection and/or diagnosis of SARS-CoV-2 by  FDA under an Emergency Use Authorization (EUA). This EUA will remain  in effect (meaning this test can be used) for the duration of the  Covid-19 declaration under Section 564(b)(1) of the Act, 21  U.S.C. section 360bbb-3(b)(1), unless the authorization is  terminated or revoked. Performed at [facility], [address] [place], [place] [address]      Radiological Exams on Admission: CT Angio Chest PE W/Cm &/Or Wo Cm  Result Date: 2020/01/01 CLINICAL DATA:  Weakness EXAM: CT ANGIOGRAPHY CHEST WITH CONTRAST TECHNIQUE: Multidetector CT imaging of the chest was performed using the standard protocol during bolus administration of intravenous contrast. Multiplanar CT image reconstructions and MIPs were obtained to evaluate the vascular anatomy. CONTRAST:  32mL OMNIPAQUE IOHEXOL 350 MG/ML SOLN COMPARISON:  08/07/2011 CT, 05/27/2019 CT, plain film from 12/10/2019 FINDINGS: Cardiovascular: Atherosclerotic calcifications of the thoracic aorta are noted without aneurysmal dilatation. Coronary calcifications are seen. Pulmonary artery demonstrates a  normal branching pattern without definitive pulmonary emboli. No pericardial effusion is seen. Mediastinum/Nodes: Thoracic inlet is within normal limits. No hilar or mediastinal adenopathy is noted. The esophagus as visualized is within normal limits. Lungs/Pleura: Lungs are well aerated bilaterally. Mild emphysematous changes are seen. Stable scarring/nodularity is noted in the right upper lobe best seen on image number 13 of  series 6. This is stable from the prior exam of 2012 and felt to be benign in etiology. Mild patchy reticulonodular infiltrate is noted in the bases bilaterally. This could be related to aspiration. Upper Abdomen: Visualized upper abdomen is within normal limits. Musculoskeletal: Degenerative changes of the thoracic spine are noted. Superior compression deformity of L1 is seen. This is stable from the recent plain film examination but new from a prior exam of 05/27/2019 Review of the MIP images confirms the above findings. IMPRESSION: No evidence of pulmonary emboli. Mild reticulonodular infiltrate in the bases bilaterally which may be related to aspiration. Stable nodular density in the right upper lobe unchanged from 2012 consistent with a benign etiology. L1 compression deformity similar to that seen on recent plain film but new from prior CT examination. Aortic Atherosclerosis (ICD10-I70.0). Electronically Signed   By: Alcide Clever M.D.   On: 12/09/2019 15:02   DG Chest Port 1 View  Result Date: 12/11/2019 CLINICAL DATA:  Hypoxia. EXAM: PORTABLE CHEST 1 VIEW COMPARISON:  Chest x-ray dated June 09, 2019. FINDINGS: Macro cardiac increased interstitial opacities at the lung bases. No focal consolidation, pleural effusion, or pneumothorax. No acute osseous abnormality. IMPRESSION: 1. Increased interstitial opacities at the lung bases could reflect mild interstitial edema or atypical infection such as viral pneumonia. Electronically Signed   By: Obie Dredge M.D.   On: 12/21/2019 11:24       EKG: Independently reviewed.  Sinus rhythm, QTC 498, left bundle blockage, poor R wave progression, ST depression in V5-V6.  Assessment/Plan Principal Problem:   Aspiration pneumonia (HCC) Active Problems:   Acute respiratory failure with hypoxia (HCC)   Type 2 diabetes mellitus with stage 3 chronic kidney disease, without long-term current use of insulin (HCC)   Essential hypertension   GERD (gastroesophageal reflux disease)   Hypercholesterolemia   Stroke (HCC)   CKD (chronic kidney disease), stage IIIa   Elevated troponin   CAD (coronary artery disease)   Chronic systolic CHF (congestive heart failure) (HCC)   Hyperkalemia   Acute respiratory failure with hypoxia due to aspiration pneumonia: Patient is on 6 L nasal cannula oxygen with oxygen saturation 94% on room air.  Has tachypnea with RR 30 to 40s.  - will admit to SDU as inpt - IV unasyn - Bonchodilators - Urine legionella and S. pneumococcal antigen - Follow up blood culture x2, sputum culture  - IVF: 500cc x 2 of NS bolus in ED, followed by 75 mL per hour of NS  - NPO and get SLP - will hold all oral meds until SLP done - consulted ICU, Dr. Belia Heman  Type 2 diabetes mellitus with stage 3 chronic kidney disease, without long-term current use of insulin (HCC): Most recent A1c 6.1, well controled. Patient is taking Metformin at home -SSI  Essential hypertension: -IV prn hydralazine  GERD (gastroesophageal reflux disease): -IV pepcid  Hypercholesterolemia: -hold home lipitor  Stroke [facility]): -Hold ASA and lipitor  CKD (chronic kidney disease), stage IIIa: Slightly worsened than baseline.  Baseline creatinine 1.0-1.2.  His creatinine is 1.027, BUN 34 -f/u by BMP -IVF as above  Elevated troponin and hx of CAD: Troponin 26, 83.  No chest pain, most likely due to demand ischemia. -Trend troponin -Check A1c, FLP -Repeat EKG in morning -hold oral meds  Chronic systolic CHF (congestive heart failure) (HCC):  BNP 138. No leg edema or JVD.  CHF seem to be compensated.  2D echo on 03/13/2019 showed EF 30-35% -Watch volume status closely.  Hyperkalemia: K5.3 -On IV fluid as above   Inpatient status:  # Patient requires inpatient status due to high intensity of service, high risk for further deterioration and high frequency of surveillance required.  I certify that at the point of admission it is my clinical judgment that the patient will require inpatient hospital care spanning beyond 2 midnights from the point of admission.  . This patient has multiple chronic comorbidities including  hypertension, hyperlipidemia, diabetes mellitus, stroke with dysarthria, GERD, gout, depression with anxiety, subclavian artery stenosis, PVD, left bundle blockade, tobacco abuse, sCHF with EF 30-35%, GI bleeding, CAD, myocardial infarction, CKD--3, dementia . Now patient has presenting with  acute respiratory failure with hypoxia due to possible aspiration pneumonia. . The initial radiographic and laboratory data are worrisome because of hyperkalemia, elevated troponin, possible aspiration by CTA scan . Current medical needs: please see my assessment and plan Predictability of an adverse outcome (risk): patient has multiple comorbidities as listed above. Now presents with  acute respiratory failure with hypoxia due to possible aspiration pneumonia. Patient's presentation is highly complicated.  Patient is at high risk of deteriorating.  Will need to be treated in hospital for at least 2 days.           DVT ppx: SQ Lovenox Code Status: Full code per his wife Family Communication: Yes, patient's wife   at bed side Disposition Plan:  Anticipate discharge back to previous home environment Consults called:  Dr. Mortimer Fries of ICU Admission status: SDU bed as inpt     Date of Service 2020/01/04    [facility]   If 7PM-7AM, please contact night-coverage [email or web address] 2020/01/04, 7:52 PM

## 2019-12-20 NOTE — ED Notes (Addendum)
Attempted to give pt PO medication with applesauce when given the applesauce pt held the applesauce in his mouth. He did not swallow it. Tried giving a straw pt was not able to drink anything. Notified Dr. Clyde Lundborg, MD that I did not feel safe giving PO medications at this time. Per MD, hold oral meds.

## 2019-12-20 NOTE — ED Notes (Addendum)
Date and time results received: [id number]  Test: Troponin Critical Value: increased to 43  Name of Provider Notified: Dr. Colon Branch, MD

## 2019-12-20 NOTE — ED Provider Notes (Signed)
[facility] Emergency Department Provider Note  ____________________________________________   First MD Initiated Contact with Patient 01/07/2020 1100     (approximate)  I have reviewed the triage vital signs and the nursing notes.  History  Chief Complaint Weakness    HPI Kevin Case is a 79 y.o. male with history of CAD, HFrEF 30 to 35%, CVA with resultant dysarthria, DM, dementia who presents via EMS from home.  Per EMS wife called out due to increasing weakness over the last week or two.  Progressively worsening since onset.  No alleviating components.  Last month, patient was able to ambulate at his baseline, now is unable to get up or care for himself at all, wife has to bath him in bed.  On arrival patient noted to have labored breathing, oxygen 85% on RA, does not normally wear supplemental oxygen.  Placed on 6 L nasal cannula on arrival.  Caveat: History somewhat limited due to to his dysarthria.  Per wife via phone, he hasn't been eating/drinking well over last several weeks as well. Only taking small amount of PO. Has been having progressive decline over the last month or so. Prior to this he was ambulatory with walker and fairly independent. Hx dementia.    Past Medical Hx Past Medical History:  Diagnosis Date  . Anemia   . Blood transfusion without reported diagnosis   . CAD (coronary artery disease)    a. 03/2019 Cath: LM nl, LAD 50p CA2+, 28m, D1 50ost, LCX 20p, 101m, RCA small, mild diff dzs-->Med rx. Consider PCI LCX for refractory angina.  . Carotid arterial disease ([place])    a. 03/2019 Carotid U/S: RICA 7-40%, RECA <81%, LICA 4-48%, LECA <18%.  . CHF (congestive heart failure) ([place])   . Clotting disorder ([place])   . Diabetes mellitus without complication ([place])   . GIB (gastrointestinal bleeding)    a. 03/2019 BRBPR following PV procedure req anticoagulation-->2u PRBCs.  . Heart murmur   . HFrEF (heart failure with reduced ejection  fraction) ([place])    a. 03/2019 Echo: EF 30-35%, mild conc LVH. Mildly dil LA. Mod MV annular dil. Mod AS (mean grad 93mmHg, Valve area 0.76).  Marland Kitchen History of tobacco abuse   . Hypertension   . Ischemic cardiomyopathy    a. 03/2019 Echo: EF 30-35%.  . Left bundle branch block   . Moderate aortic stenosis    a. 03/2019 Echo:  Mod AS (mean grad 72mmHg, Valve area 0.76).  Marland Kitchen PAD (peripheral artery disease) ([place])    a. 03/2019 PV Angio: RCFA 80, RSFA 100.  . Rib fracture   . Sinus bradycardia   . 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 03/2019 PV Angio: RSCA 90 after origin of CCA. RCCA 30ost, LSCA 80 (PTA and covered stenting).    Problem List Patient Active Problem List   Diagnosis Date Noted  . Agitation 11/20/2019  . Psychosis ([place]) 11/20/2019  . Behavior concern   . Altered mental status 11/14/2019  . Arthritis 10/31/2019  . Atherosclerotic peripheral vascular disease with intermittent claudication ([place]) 07/29/2019  . Chronic mesenteric ischemia ([place]) 06/18/2019  . Superior mesenteric artery stenosis ([place]) 06/18/2019  . Gastric ulcer   . Ischemic gastroenteritis   . Type 2 diabetes mellitus with stage 3 chronic kidney disease, without long-term current use of insulin ([place]) 05/06/2019  . GI bleed 04/02/2019  . Hypotension 04/02/2019  . Stenosis of left subclavian artery ([place]) 03/31/2019  . Non-ST elevation (NSTEMI) myocardial infarction ([place])   .  Acute systolic heart failure (HCC)   . Acute respiratory failure with hypoxia (HCC) 03/12/2019  . GERD (gastroesophageal reflux disease) 11/24/2018  . Subclinical hypothyroidism 01/29/2018  . Actinic keratosis 06/25/2016  . Benign nodular prostatic hyperplasia 04/04/2016  . Sinus bradycardia 02/09/2016  . Aortic sclerosis 12/14/2015  . Bilateral carotid artery stenosis 12/14/2015  . Seborrheic dermatitis 02/13/2011  . Essential hypertension 02/01/2010  . Hypercholesterolemia 02/01/2010  . Nondependent alcohol abuse, in remission  02/01/2010  . Tobacco use disorder, moderate, in sustained remission 02/01/2010    Past Surgical Hx Past Surgical History:  Procedure Laterality Date  . AORTIC ARCH ANGIOGRAPHY N/A 03/31/2019   Procedure: AORTIC ARCH ANGIOGRAPHY;  Surgeon: Iran Ouch, MD;  [facility]: [facility] INVASIVE CV LAB;  Service: Cardiovascular;  Laterality: N/A;  . CARDIAC CATHETERIZATION    . CORONARY ANGIOPLASTY    . ESOPHAGOGASTRODUODENOSCOPY (EGD) WITH PROPOFOL N/A 05/27/2019   Procedure: ESOPHAGOGASTRODUODENOSCOPY (EGD) WITH PROPOFOL;  Surgeon: Toney Reil, MD;  Location: [facility] ENDOSCOPY;  Service: Gastroenterology;  Laterality: N/A;  . ESOPHAGOGASTRODUODENOSCOPY (EGD) WITH PROPOFOL N/A 10/11/2019   Procedure: ESOPHAGOGASTRODUODENOSCOPY (EGD) WITH PROPOFOL;  Surgeon: Toney Reil, MD;  Location: [facility] ENDOSCOPY;  Service: Gastroenterology;  Laterality: N/A;  . LOWER EXTREMITY ANGIOGRAPHY Right 08/04/2019   Procedure: LOWER EXTREMITY ANGIOGRAPHY;  Surgeon: Annice Needy, MD;  Location: [facility] INVASIVE CV LAB;  Service: Cardiovascular;  Laterality: Right;  . PERIPHERAL VASCULAR INTERVENTION Left 03/31/2019   Procedure: PERIPHERAL VASCULAR INTERVENTION;  Surgeon: Iran Ouch, MD;  Location: [facility] INVASIVE CV LAB;  Service: Cardiovascular;  Laterality: Left;  . RIGHT/LEFT HEART CATH AND CORONARY ANGIOGRAPHY N/A 03/15/2019   Procedure: RIGHT/LEFT HEART CATH AND CORONARY ANGIOGRAPHY;  Surgeon: Iran Ouch, MD;  Location: [facility] INVASIVE CV LAB;  Service: Cardiovascular;  Laterality: N/A;  . VISCERAL ANGIOGRAPHY N/A 05/28/2019   Procedure: VISCERAL ANGIOGRAPHY;  Surgeon: Annice Needy, MD;  Location: [facility] INVASIVE CV LAB;  Service: Cardiovascular;  Laterality: N/A;    Medications Prior to Admission medications   Medication Sig Start Date End Date Taking? Authorizing Provider  acetaminophen (TYLENOL) 500 MG tablet Take 1,000 mg by mouth every 6 (six) hours as needed (headache.).    [provider]    aspirin EC 81 MG tablet Take 1 tablet (81 mg total) by mouth daily. 08/04/19   Annice Needy, MD  atorvastatin (LIPITOR) 80 MG tablet TAKE 1 TABLET BY MOUTH ONCE DAILY AT BEDTIME Patient taking differently: Take 80 mg by mouth at bedtime.  10/18/19   Iran Ouch, MD  clopidogrel (PLAVIX) 75 MG tablet Take 1 tablet (75 mg total) by mouth daily. 12/06/19   Iran Ouch, MD  divalproex (DEPAKOTE) 125 MG DR tablet Take 1 tablet (125 mg total) by mouth 2 (two) times daily. 12/15/19 03/14/20  Trey Sailors, PA-C  docusate sodium (COLACE) 100 MG capsule Take 100 mg by mouth daily as needed for mild constipation.     [provider]  empagliflozin (JARDIANCE) 10 MG TABS tablet Take 10 mg by mouth daily before breakfast. 12/07/19   Trey Sailors, PA-C  levothyroxine (SYNTHROID) 25 MCG tablet Take 25 mcg by mouth daily before breakfast.    [provider]  Melatonin 10 MG TABS Take 10 mg by mouth at bedtime.     [provider]  metFORMIN (GLUCOPHAGE) 500 MG tablet Take 1 tablet (500 mg total) by mouth 2 (two) times daily with a meal. 12/07/19   Trey Sailors, PA-C  mirtazapine (REMERON)  7.5 MG tablet Take 7.5 mg by mouth at bedtime.    [provider]  Multiple Vitamin (MULTIVITAMIN WITH MINERALS) TABS tablet Take 1 tablet by mouth daily.     [provider]  Omega-3 Fatty Acids (FISH OIL) 1000 MG CAPS Take 1,000 mg by mouth daily.    [provider]  omeprazole (PRILOSEC) 40 MG capsule Take 1 capsule (40 mg total) by mouth daily. 10/11/19 01/09/20  Toney Reil, MD  sitaGLIPtin (JANUVIA) 25 MG tablet Take 1 tablet (25 mg total) by mouth daily. 12/03/19   Trey Sailors, PA-C    Allergies Patient has no known allergies.  Family Hx Family History  Problem Relation Age of Onset  . Congestive Heart Failure Father        had AICD placed.   . Dementia Father   . Dementia Mother     Social Hx Social History   Tobacco Use  .  Smoking status: Former Smoker    Packs/day: 1.50    Years: 57.00    Pack years: 85.50    Types: Cigarettes    Quit date: 2012    Years since quitting: 9.1  . Smokeless tobacco: Never Used  Substance Use Topics  . Alcohol use: No  . Drug use: Never     Review of Systems  Constitutional: Negative for fever, chills.  Positive for weakness. Eyes: Negative for visual changes. ENT: Negative for sore throat. Cardiovascular: Negative for chest pain. Respiratory: Positive for shortness of breath. Gastrointestinal: Negative for nausea, vomiting.  Genitourinary: Negative for dysuria. Musculoskeletal: Negative for leg swelling. Skin: Negative for rash. Neurological: Negative for headaches.   Physical Exam  Vital Signs: ED Triage Vitals  Enc Vitals Group     BP 12/21/2019 1101 (!) 150/66     Pulse Rate 12/21/2019 1101 96     Resp 12/11/2019 1101 (!) 33     Temp 12/26/2019 1101 98.5 F (36.9 C)     Temp Source 12/16/2019 1101 Oral     SpO2 12/25/2019 1101 94 %     Weight 12/08/2019 1102 138 lb 14.2 oz (63 kg)     Height 12/10/2019 1102 5\' 9"  (1.753 m)     Head Circumference --      Peak Flow --      Pain Score --      Pain Loc --      Pain Edu? --      Excl. in GC? --     Constitutional: Awake and alert, baseline dysarthria, difficult to understand.  Head: Normocephalic. Atraumatic. Eyes: Conjunctivae clear. Sclera anicteric. Nose: No congestion. No rhinorrhea. Mouth/Throat: Wearing mask. MM extremely dry. Neck: No stridor.   Cardiovascular: Normal rate, regular rhythm. Extremities well perfused. Respiratory: Increased respiratory effort.  Tachypneic to 30s.  On 6 L nasal cannula via mouth.  Coarse lung sounds at bases. Gastrointestinal: Soft. Non-tender. Non-distended.  Musculoskeletal: No lower extremity edema or asymmetry. No deformities. Neurologic: Baseline dysarthria. No gross focal neurologic deficits are appreciated.  Skin: Skin is warm, dry and intact. No rash  noted. Psychiatric: Mood and affect are appropriate for situation.  EKG  Personally reviewed.   Rate: 97 Rhythm: sinus Axis: LAD Intervals: wide QRS 2/2 LBBB LBBB, negative Sgarbossa criteria     Radiology  CXR:  IMPRESSION:  1. Increased interstitial opacities at the lung bases could reflect  mild interstitial edema or atypical infection such as viral  pneumonia.   CT PE: IMPRESSION:  No evidence of pulmonary  emboli.   Mild reticulonodular infiltrate in the bases bilaterally which may  be related to aspiration.   Stable nodular density in the right upper lobe unchanged from 2012  consistent with a benign etiology.   L1 compression deformity similar to that seen on recent plain film  but new from prior CT examination.     Procedures  Procedure(s) performed (including critical care):  .Critical Care Performed by: Miguel Aschoff., MD Authorized by: Miguel Aschoff., MD   Critical care provider statement:    Critical care time (minutes):  35   Critical care was necessary to treat or prevent imminent or life-threatening deterioration of the following conditions:  Respiratory failure   Critical care was time spent personally by me on the following activities:  Discussions with consultants, evaluation of patient's response to treatment, examination of patient, ordering and performing treatments and interventions, ordering and review of laboratory studies, ordering and review of radiographic studies, pulse oximetry, re-evaluation of patient's condition, obtaining history from patient or surrogate and review of old charts     Initial Impression / Assessment and Plan / ED Course  79 y.o. male who presents to the ED for weakness, hypoxia as above  Ddx: pulmonary infection, ACS, HF (though clinically actually appears dehydrated rather than volume overloaded), COVID, dehydration, electrolyte abnormality  Plan for labs, imaging, EKG, supplemental oxygen  Work-up appears  to be concerning for possible aspiration pneumonia (likely in setting of CVA).  CT negative for PE, but infiltrates at the bases concerning for aspiration.  Given his new hypoxia, will opt to treat with Unasyn.  COVID testing negative.  BNP unremarkable.  HS troponin minimally elevated at 26, trending.   HR recently increased from 90s into 120s. In discussion w/ wife on phone, he has not taken his medications today, including his metoprolol XL. Will give his dose and IVF and continue to monitor.   Will plan to admit.    Final Clinical Impression(s) / ED Diagnosis  Final diagnoses:  SOB (shortness of breath)  Hypoxia       Note:  This document was prepared using Dragon voice recognition software and may include unintentional dictation errors.   Miguel Aschoff., MD 01-07-2020 1520

## 2019-12-20 NOTE — ED Triage Notes (Signed)
Pt via EMS from home. Per EMS, wife called due to weakness for the past week. Pt was ambulatory last week and now is unable to get up. On arrival, pt is having labored breathing and O2 sat on arrival was 85% on RA, pt does not wear O2 at home. Placed on 4L [place] and pt is sat 95%. Pt is alert and oriented x4 but has a baseline of garbled speech.

## 2019-12-20 NOTE — Telephone Encounter (Signed)
FYI

## 2019-12-21 ENCOUNTER — Encounter (INDEPENDENT_AMBULATORY_CARE_PROVIDER_SITE_OTHER): Payer: Medicare Other

## 2019-12-21 ENCOUNTER — Ambulatory Visit (INDEPENDENT_AMBULATORY_CARE_PROVIDER_SITE_OTHER): Payer: Medicare Other | Admitting: Vascular Surgery

## 2019-12-21 ENCOUNTER — Inpatient Hospital Stay (HOSPITAL_COMMUNITY)
Admit: 2019-12-21 | Discharge: 2019-12-21 | Disposition: A | Discharge status: Home / Self Care | Payer: Medicare Other | Attending: Physician Assistant | Admitting: Physician Assistant

## 2019-12-21 ENCOUNTER — Encounter: Payer: Self-pay | Admitting: Internal Medicine

## 2019-12-21 DIAGNOSIS — Z7189 Other specified counseling: Secondary | ICD-10-CM

## 2019-12-21 DIAGNOSIS — L8990 Pressure ulcer of unspecified site, unspecified stage: Secondary | ICD-10-CM | POA: Insufficient documentation

## 2019-12-21 DIAGNOSIS — I350 Nonrheumatic aortic (valve) stenosis: Secondary | ICD-10-CM

## 2019-12-21 DIAGNOSIS — I351 Nonrheumatic aortic (valve) insufficiency: Secondary | ICD-10-CM

## 2019-12-21 DIAGNOSIS — Z515 Encounter for palliative care: Secondary | ICD-10-CM

## 2019-12-21 DIAGNOSIS — G309 Alzheimer's disease, unspecified: Secondary | ICD-10-CM

## 2019-12-21 DIAGNOSIS — I214 Non-ST elevation (NSTEMI) myocardial infarction: Secondary | ICD-10-CM

## 2019-12-21 LAB — BASIC METABOLIC PANEL
Anion gap: 9 (ref 5–15)
BUN: 28 mg/dL — ABNORMAL HIGH (ref 8–23)
CO2: 23 mmol/L (ref 22–32)
Calcium: 8.6 mg/dL — ABNORMAL LOW (ref 8.9–10.3)
Chloride: 111 mmol/L (ref 98–111)
Creatinine, Ser: 1.3 mg/dL — ABNORMAL HIGH (ref 0.61–1.24)
GFR calc Af Amer: >60 mL/min (ref >60)
GFR calc non Af Amer: 53 mL/min — ABNORMAL LOW (ref >60)
Glucose, Bld: 164 mg/dL — ABNORMAL HIGH (ref 70–99)
Potassium: 5.2 mmol/L — ABNORMAL HIGH (ref 3.5–5.1)
Sodium: 143 mmol/L (ref 135–145)

## 2019-12-21 LAB — CBC
HCT: 40.2 % (ref 39.0–52.0)
Hemoglobin: 13.2 g/dL (ref 13.0–17.0)
MCH: 33.3 pg (ref 26.0–34.0)
MCHC: 32.8 g/dL (ref 30.0–36.0)
MCV: 101.5 fL — ABNORMAL HIGH (ref 80.0–100.0)
Platelets: 187 10*3/uL (ref 150–400)
RBC: 3.96 MIL/uL — ABNORMAL LOW (ref 4.22–5.81)
RDW: 13.2 % (ref 11.5–15.5)
WBC: 20 10*3/uL — ABNORMAL HIGH (ref 4.0–10.5)
nRBC: 0 % (ref 0.0–0.2)

## 2019-12-21 LAB — GLUCOSE, CAPILLARY
Glucose-Capillary: 140 mg/dL — ABNORMAL HIGH (ref 70–99)
Glucose-Capillary: 136 mg/dL — ABNORMAL HIGH (ref 70–99)

## 2019-12-21 LAB — LIPID PANEL
Cholesterol: 120 mg/dL (ref 0–200)
HDL: 35 mg/dL — ABNORMAL LOW (ref >40)
LDL Cholesterol: 70 mg/dL (ref 0–99)
Total CHOL/HDL Ratio: 3.4 RATIO
Triglycerides: 76 mg/dL (ref <150)
VLDL: 15 mg/dL (ref 0–40)

## 2019-12-21 LAB — ECHOCARDIOGRAM COMPLETE
Height: 70 in
Weight: 2218.71 oz

## 2019-12-21 LAB — C-REACTIVE PROTEIN: CRP: 7.4 mg/dL — ABNORMAL HIGH (ref <1.0)

## 2019-12-21 LAB — APTT: aPTT: 35 seconds (ref 24–36)

## 2019-12-21 LAB — PROCALCITONIN: Procalcitonin: 0.21 ng/mL

## 2019-12-21 LAB — MRSA PCR SCREENING: MRSA by PCR: NEGATIVE

## 2019-12-21 LAB — HEMOGLOBIN A1C
Hgb A1c MFr Bld: 6.1 % — ABNORMAL HIGH (ref 4.8–5.6)
Mean Plasma Glucose: 128.37 mg/dL

## 2019-12-21 LAB — TROPONIN I (HIGH SENSITIVITY)
Troponin I (High Sensitivity): 2600 ng/L (ref <18)
Troponin I (High Sensitivity): 2931 ng/L (ref <18)

## 2019-12-21 LAB — PROTIME-INR
INR: 1.2 (ref 0.8–1.2)
Prothrombin Time: 15 seconds (ref 11.4–15.2)

## 2019-12-21 MED ORDER — HALOPERIDOL LACTATE 2 MG/ML PO CONC
0.5000 mg | ORAL | Status: DC | PRN
  Filled 2019-12-21: qty 0.3

## 2019-12-21 MED ORDER — CHLORHEXIDINE GLUCONATE CLOTH 2 % EX PADS
6.0000 | MEDICATED_PAD | Freq: Every day | CUTANEOUS | Status: DC
  Administered 2019-12-21: 01:00:00 6 via TOPICAL

## 2019-12-21 MED ORDER — SODIUM CHLORIDE 0.9 % IV SOLN
250.0000 mL | INTRAVENOUS | Status: DC
  Administered 2019-12-21: 04:00:00 250 mL via INTRAVENOUS

## 2019-12-21 MED ORDER — GLYCOPYRROLATE 0.2 MG/ML IJ SOLN: 0.2000 mg | INTRAMUSCULAR | Status: DC | PRN

## 2019-12-21 MED ORDER — HALOPERIDOL LACTATE 5 MG/ML IJ SOLN: 0.5000 mg | INTRAMUSCULAR | Status: DC | PRN

## 2019-12-21 MED ORDER — LORAZEPAM 2 MG/ML IJ SOLN
0.5000 mg | INTRAMUSCULAR | Status: DC | PRN
  Administered 2019-12-21: 11:00:00 2 mg via INTRAVENOUS
  Administered 2019-12-21 (×2): 1 mg via INTRAVENOUS
  Filled 2019-12-21 (×2): qty 1

## 2019-12-21 MED ORDER — MORPHINE 100MG IN NS 100ML (1MG/ML) PREMIX INFUSION
2.0000 mg/h | INTRAVENOUS | Status: DC
  Administered 2019-12-21: 2 mg/h via INTRAVENOUS
  Filled 2019-12-21: qty 100

## 2019-12-21 MED ORDER — GLYCOPYRROLATE 1 MG PO TABS
1.0000 mg | ORAL_TABLET | ORAL | Status: DC | PRN
  Filled 2019-12-21: qty 1

## 2019-12-21 MED ORDER — HEPARIN (PORCINE) 25000 UT/250ML-% IV SOLN
750.0000 [IU]/h | INTRAVENOUS | Status: DC
  Administered 2019-12-21: 750 [IU]/h via INTRAVENOUS
  Filled 2019-12-21: qty 250

## 2019-12-21 MED ORDER — NOREPINEPHRINE 4 MG/250ML-% IV SOLN
2.0000 ug/min | INTRAVENOUS | Status: DC
  Administered 2019-12-21: 04:00:00 2 ug/min via INTRAVENOUS
  Filled 2019-12-21: qty 250

## 2019-12-21 MED ORDER — HEPARIN BOLUS VIA INFUSION
3700.0000 [IU] | Freq: Once | INTRAVENOUS | Status: AC
  Administered 2019-12-21: 3700 [IU] via INTRAVENOUS
  Filled 2019-12-21: qty 3700

## 2019-12-21 MED ORDER — BIOTENE DRY MOUTH MT LIQD: 15.0000 mL | OROMUCOSAL | Status: DC | PRN

## 2019-12-21 MED ORDER — ASPIRIN 300 MG RE SUPP
300.0000 mg | Freq: Every day | RECTAL | Status: DC
  Administered 2019-12-21: 300 mg via RECTAL
  Filled 2019-12-21: qty 1

## 2019-12-21 MED ORDER — POLYVINYL ALCOHOL 1.4 % OP SOLN: 1.0000 [drp] | Freq: Four times a day (QID) | OPHTHALMIC | Status: DC | PRN

## 2019-12-21 MED ORDER — ONDANSETRON HCL 4 MG/2ML IJ SOLN: 4.0000 mg | Freq: Four times a day (QID) | INTRAMUSCULAR | Status: DC | PRN

## 2019-12-21 MED ORDER — ACETAMINOPHEN 650 MG RE SUPP: 650.0000 mg | Freq: Four times a day (QID) | RECTAL | Status: DC | PRN

## 2019-12-21 MED ORDER — ONDANSETRON 4 MG PO TBDP
4.0000 mg | ORAL_TABLET | Freq: Four times a day (QID) | ORAL | Status: DC | PRN
  Filled 2019-12-21: qty 1

## 2019-12-21 MED ORDER — HALOPERIDOL 0.5 MG PO TABS
0.5000 mg | ORAL_TABLET | ORAL | Status: DC | PRN
  Filled 2019-12-21: qty 1

## 2019-12-21 MED ORDER — ACETAMINOPHEN 325 MG PO TABS: 650.0000 mg | ORAL_TABLET | Freq: Four times a day (QID) | ORAL | Status: DC | PRN

## 2019-12-21 NOTE — ED Notes (Signed)
Patient mouth and lips cleaned and moisturizer placed by this Clinical research associate.

## 2019-12-21 NOTE — Progress Notes (Signed)
*  PRELIMINARY RESULTS* Echocardiogram 2D Echocardiogram has been performed.  Joanette Gula Courtney Fenlon 12/21/2019, 9:59 AM

## 2019-12-21 NOTE — Progress Notes (Signed)
SLP Cancellation Note  Patient Details Name: Kevin Case MRN: [id number] DOB: August 18, 1941   Cancelled treatment:       Reason Eval/Treat Not Completed: Patient not medically ready;Patient's level of consciousness;Medical issues which prohibited therapy(chart reviewed; consulted NSG re: pt's status). Upon observation, noted open-mouth posture and mouth breathing; increased RR at rest. Pt is not able to maintain alertness for safe oral intake at this time. Recommend continuing NPO w/ frequent oral care for hygiene and stimulation of swallowing. Aspiration precautions.     Jerilynn Som, MS, CCC-SLP Cortny Bambach 12/21/2019, 10:10 AM

## 2019-12-21 NOTE — Progress Notes (Signed)
Pt appears warm to touch temp taken. 102.3 ax. Wife at bedside made aware and requested for patient not to receive any interventions. Requested to receive a visit from chaplin, chaplin notified.

## 2019-12-21 NOTE — Progress Notes (Signed)
CH visited pt. after observing bereavement cart outside rm. and consulting w/RN.  Pt. in bed sedated w/nasal canula for O2; pt.'s wife @ bedside tearful and afraid.  Wife immediately grateful for [facility] visit; appeared to be experiencing acute anticipatory grief and fear of pt.'s death; wife expressed 'I'm not sure he's going to make it...'.  Palliative care team had visited earlier; discussed pt.'s prognosis and comfort care measures; wife decided to proceed w/comfort care status.  When [facility] visited, wife was attempting to contact church to ask if pastor could come visit; family members also enroute, wife reported, but would not be able to get to [facility] until afternoon/evening.  CH provided prayer for pt. and wife and assisted in procuirng warm blankets for pt. and some snacks/coffee for wife.  CH also made wife aware of bereavement cart food's availability; CH had meeting to attend and made referral to [facility] for follow-up; further referral to night chaplain is recommended.         12/21/19 1350  Clinical Encounter Type  Visited With Patient and family together  Visit Type Initial;Psychological support;Spiritual support;Social support;Critical Care;Patient actively dying  Recommendations Chaplain follow-up; grief support  Spiritual Encounters  Spiritual Needs Prayer;Emotional;Grief support  Stress Factors  Family Stress Factors Health changes;Loss;Loss of control;Major life changes

## 2019-12-21 NOTE — Progress Notes (Signed)
Pt lying in bed pt. Remains on comfort care family at bedside . Pt has increased breathing ativan given an morphine increased will cont.to monitor.

## 2019-12-21 NOTE — Consult Note (Signed)
Consultation Note Date: 12/21/2019   Patient Name: Kevin Case  DOB: [birth year]  MRN: [id number]  Age / Sex: 79 y.o., male  PCP: Trinna Post, PA-C Referring Physician: Sidney Ace, MD  Reason for Consultation: Establishing goals of care and Psychosocial/spiritual support  HPI/Patient Profile: 79 y.o. male  with past medical history of hypertension, hyperlipidemia, diabetes mellitus, stroke with dysarthria, GERD, gout, depression with anxiety, subclavian artery stenosis, PVD, left bundle blockade, tobacco abuse, sCHF with EF 30-35%, GI bleeding, CAD, myocardial infarction, CKD--3, dementia admitted on 12/19/2019 with aspiration pneumonia.   Clinical Assessment and Goals of Care: I have reviewed medical records including EPIC notes, labs and imaging, received report from attending and bedside nursing, examined the patient and met at bedside with wife of 42 years, Doroteo Bradford to discuss diagnosis prognosis, GOC, EOL wishes, disposition and options.   Kevin Case appears acutely/chronically ill and frail.  He will make but not keep eye contact.  He has trouble speaking due to prior stroke, unable to make his basic needs known.  He is tearful during our conversation.  I introduced Palliative Medicine as specialized medical care for people living with serious illness. It focuses on providing relief from the symptoms and stress of a serious illness.   As far as functional and nutritional status, Doroteo Bradford endorses a decline over the last few months, the last 1 week in particular.  We discussed current illness and what it means in the larger context of on-going co-morbidities.  Natural disease trajectory and expectations at EOL were discussed.  The difference between aggressive medical intervention and comfort care was considered in light of the patient's goals of care.  Wife quickly endorses comfort care,  stating that she does not want her husband to suffer anymore.  We talked about on burdening him from painful procedures, medicines that are changing what is happening.  We also talked about liberalizing medications for comfort.  Advanced directives, concepts specific to code status, artifical feeding and hydration, and rehospitalization were considered and discussed.    Questions and concerns were addressed.  The family was encouraged to call with questions or concerns.   Doroteo Bradford shares that Kevin Case son, Hassell Done and Martin's wife Lyda Jester may come for a visit.  HCPOA    NEXT OF KIN - wife of 51 years, Bonita    SUMMARY OF RECOMMENDATIONS   Full comfort care Anticipate hospital death  Code Status/Advance Care Planning:  DNR  Symptom Management:   Orders adjusted for comfort care, morphine infusion, Ativan  Palliative Prophylaxis:   Aspiration, Frequent Pain Assessment and Turn Reposition  Additional Recommendations (Limitations, Scope, Preferences):  Full Comfort Care  Psycho-social/Spiritual:   Desire for further Chaplaincy support:no  Additional Recommendations: Caregiving  Support/Resources and Grief/Bereavement Support  Prognosis:   Hours - Days  Discharge Planning: Anticipated Hospital Death      Primary Diagnoses: Present on Admission: . Type 2 diabetes mellitus with stage 3 chronic kidney disease, without long-term current use of insulin ([place]) . GERD (gastroesophageal reflux disease) .  Hypercholesterolemia . Essential hypertension . Acute respiratory failure with hypoxia ([place]) . Stroke ([place]) . CKD (chronic kidney disease), stage IIIa . Aspiration pneumonia ([place]) . Elevated troponin . CAD (coronary artery disease) . Chronic systolic CHF (congestive heart failure) ([place]) . Hyperkalemia   I have reviewed the medical record, interviewed the patient and family, and examined the patient. The following aspects are pertinent.  Past Medical History:    Diagnosis Date  . Anemia   . Blood transfusion without reported diagnosis   . CAD (coronary artery disease)    a. 03/2019 Cath: LM nl, LAD 50p CA2+, 75m D1 50ost, LCX 20p, 858mRCA small, mild diff dzs-->Med rx. Consider PCI LCX for refractory angina.  . Carotid arterial disease ([facility]   a. 03/2019 Carotid U/S: RICA 11-09-36%RECA <5<75%LICA 11-09-41%LECA <5<32% . Clotting disorder ([place]  . Diabetes mellitus without complication ([place]  . GIB (gastrointestinal bleeding)    a. 03/2019 BRBPR following PV procedure req anticoagulation-->2u PRBCs.  . Heart murmur   . HFrEF (heart failure with reduced ejection fraction) ([place]   a. 03/2019 Echo: EF 30-35%, mild conc LVH. Mildly dil LA. Mod MV annular dil. Mod AS (mean grad 1825m, Valve area 0.76).  . HMarland Kitchenstory of tobacco abuse   . Hypertension   . Ischemic cardiomyopathy    a. 03/2019 Echo: EF 30-35%.  . Left bundle branch block   . Moderate aortic stenosis    a. 03/2019 Echo:  Mod AS (mean grad 56m90m Valve area 0.76).  . PAMarland Kitchen (peripheral artery disease) (HCC)[place] a. 03/2019 PV Angio: RCFA 80, RSFA 100.  . Rib fracture   . Sinus bradycardia   . Subclavian arterial stenosis (HCC)[place] a. 03/2019 PV Angio: RSCA 90 after origin of CCA. RCCA 30ost, LSCA 80 (PTA and covered stenting).   Social History   Socioeconomic History  . Marital status: Married    Spouse name: Donita  . Number of children: 2  . Years of education: Not on file  . Highest education level: Not on file  Occupational History  . Not on file  Tobacco Use  . Smoking status: Former Smoker    Packs/day: 1.50    Years: 57.00    Pack years: 85.50    Types: Cigarettes    Quit date: 2012    Years since quitting: 9.1  . Smokeless tobacco: Never Used  Substance and Sexual Activity  . Alcohol use: No  . Drug use: Never  . Sexual activity: Not on file  Other Topics Concern  . Not on file  Social History Narrative  . Not on file   Social Determinants of Health   Financial  Resource Strain: Low Risk   . Difficulty of Paying Living Expenses: Not very hard  Food Insecurity: No Food Insecurity  . Worried About RunnCharity fundraiserthe Last Year: Never true  . Ran Out of Food in the Last Year: Never true  Transportation Needs: No Transportation Needs  . Lack of Transportation (Medical): No  . Lack of Transportation (Non-Medical): No  Physical Activity:   . Days of Exercise per Week: Not on file  . Minutes of Exercise per Session: Not on file  Stress: No Stress Concern Present  . Feeling of Stress : Only a little  Social Connections: Unknown  . Frequency of Communication with Friends and Family: More than three times a week  . Frequency of Social Gatherings with Friends  and Family: Not on file  . Attends Religious Services: Not on file  . Active Member of Clubs or Organizations: Not on file  . Attends Archivist Meetings: Not on file  . Marital Status: Married   Family History  Problem Relation Age of Onset  . Congestive Heart Failure Father        had AICD placed.   . Dementia Father   . Dementia Mother    Scheduled Meds: Continuous Infusions: . sodium chloride 10 mL/hr at 12/21/19 0837  . morphine 2 mg/hr (12/21/19 1202)   PRN Meds:.acetaminophen **OR** acetaminophen, albuterol, antiseptic oral rinse, glycopyrrolate **OR** glycopyrrolate **OR** glycopyrrolate, haloperidol **OR** haloperidol **OR** haloperidol lactate, hydrALAZINE, LORazepam, ondansetron **OR** ondansetron (ZOFRAN) IV, polyvinyl alcohol Medications Prior to Admission:  Prior to Admission medications   Medication Sig Start Date End Date Taking? Authorizing Provider  acetaminophen (TYLENOL) 500 MG tablet Take 1,000 mg by mouth every 6 (six) hours as needed (headache.).   Yes [provider]  aspirin EC 81 MG tablet Take 1 tablet (81 mg total) by mouth daily. 08/04/19  Yes Dew, Erskine Squibb, MD  atorvastatin (LIPITOR) 80 MG tablet TAKE 1 TABLET BY MOUTH ONCE DAILY AT  BEDTIME Patient taking differently: Take 80 mg by mouth at bedtime.  10/18/19  Yes Wellington Hampshire, MD  clopidogrel (PLAVIX) 75 MG tablet Take 1 tablet (75 mg total) by mouth daily. 12/06/19  Yes Wellington Hampshire, MD  divalproex (DEPAKOTE) 125 MG DR tablet Take 1 tablet (125 mg total) by mouth 2 (two) times daily. 12/15/19 03/14/20 Yes Trinna Post, PA-C  levothyroxine (SYNTHROID) 25 MCG tablet Take 25 mcg by mouth daily before breakfast.   Yes [provider]  LORazepam (ATIVAN) 1 MG tablet Take 1 mg by mouth daily as needed for anxiety. 11/27/19  Yes [provider]  losartan (COZAAR) 25 MG tablet Take 12.5 mg by mouth daily. 12/11/19  Yes [provider]  Melatonin 3 MG TABS Take 3 mg by mouth at bedtime.    Yes [provider]  metFORMIN (GLUCOPHAGE) 500 MG tablet Take 1 tablet (500 mg total) by mouth 2 (two) times daily with a meal. 12/07/19  Yes Terrilee Croak, Adriana M, PA-C  metoprolol succinate (TOPROL-XL) 25 MG 24 hr tablet Take 25 mg by mouth daily. 11/27/19  Yes [provider]  mirtazapine (REMERON) 7.5 MG tablet Take 7.5 mg by mouth at bedtime.   Yes [provider]  Multiple Vitamin (MULTIVITAMIN WITH MINERALS) TABS tablet Take 1 tablet by mouth daily.    Yes [provider]  Omega-3 Fatty Acids (FISH OIL) 1000 MG CAPS Take 1,000 mg by mouth daily.   Yes [provider]  omeprazole (PRILOSEC) 40 MG capsule Take 1 capsule (40 mg total) by mouth daily. 10/11/19 01/09/20 Yes Vanga, Tally Due, MD   No Known Allergies Review of Systems  Unable to perform ROS: Acuity of condition    Physical Exam Vitals and nursing note reviewed.  Constitutional:      General: He is in acute distress.     Appearance: He is ill-appearing.  Cardiovascular:     Rate and Rhythm: Normal rate.  Pulmonary:     Effort: Respiratory distress present.  Skin:    General: Skin is warm and dry.  Neurological:     Comments: Dysphagia    Psychiatric:     Comments: Tearful at times     Vital Signs: BP 101/67   Pulse 74   Temp  99.1 F (37.3 C) (Axillary)   Resp (!) 37   Ht '5\' 10"'  (1.778 m)   Wt 62.9 kg   SpO2 96%   BMI 19.90 kg/m  Pain Scale: CPOT       SpO2: SpO2: 96 % O2 Device:SpO2: 96 % O2 Flow Rate: .O2 Flow Rate (L/min): 6 L/min  IO: Intake/output summary:   Intake/Output Summary (Last 24 hours) at 12/21/2019 1302 Last data filed at 12/21/2019 5516 Gross per 24 hour  Intake 2397.51 ml  Output 0 ml  Net 2397.51 ml    LBM: Last BM Date: 12/19/19 Baseline Weight: Weight: 63 kg Most recent weight: Weight: 62.9 kg     Palliative Assessment/Data:   Flowsheet Rows     Most Recent Value  Intake Tab  Referral Department  Hospitalist  Unit at Time of Referral  Intermediate Care Unit  Palliative Care Primary Diagnosis  Pulmonary  Date Notified  12/21/19  Palliative Care Type  Return patient Palliative Care  Reason for referral  Clarify Goals of Care  Date of Admission  12/07/2019  Date first seen by Palliative Care  12/21/19  # of days Palliative referral response time  0 Day(s)  # of days IP prior to Palliative referral  1  Clinical Assessment  Palliative Performance Scale Score  30%  Pain Max last 24 hours  Not able to report  Pain Min Last 24 hours  Not able to report  Dyspnea Max Last 24 Hours  Not able to report  Dyspnea Min Last 24 hours  Not able to report  Psychosocial & Spiritual Assessment  Palliative Care Outcomes      Time In: 0910  Time Out: 1020  Time Total: 70 minutes  Greater than 50%  of this time was spent counseling and coordinating care related to the above assessment and plan.  Signed by: Drue Novel, NP   Please contact Palliative Medicine Team phone at [phone] for questions and concerns.  For individual provider: See Shea Evans

## 2019-12-21 NOTE — Progress Notes (Signed)
Pt now comfort care. Family at bedside. IV morphine administered and titrated per orders. PRN ativan administered for anxiety.

## 2019-12-21 NOTE — Progress Notes (Signed)
Chaplain continued to provide grief support. Wife continues to make meaning of decline. "I do not want him to suffer." She is experiencing anticipatory grief. Patients son and daughter-in-law arrived which gave wife additional support. Wife was able to share life review which identifies spaces of decline for which she is claiming as signs of decline and dying process. Through her tears she is able to identify her support network. She did return home to tend to her self-care needs. She is verbally thankful to be able to be with her husband. Prayer was offered which wife was verbally thankful for and acknowledges brings her comfort.     12/21/19 1900  Clinical Encounter Type  Visited With Patient;Family  Visit Type Follow-up  Referral From Chaplain  Consult/Referral To Chaplain  Spiritual Encounters  Spiritual Needs Prayer;Grief support

## 2019-12-21 NOTE — Progress Notes (Signed)
Met with patient, wife at bedside. She is grieving heavily. I sat with her, listened to her and prayed with her. The staff is allowing her to stay, she is awaiting other family members who are traveling here. I assured wife we are but a page away and someone will follow up with her today.

## 2019-12-21 NOTE — Progress Notes (Signed)
PROGRESS NOTE    Kevin Case  EXB:[id number] DOB: Feb 06, 1941 DOA: 01/01/2020 PCP: Trey Sailors, PA-C   Brief Narrative:  HPI: Kevin Case is a 79 y.o. male with medical history significant of hypertension, hyperlipidemia, diabetes mellitus, stroke with dysarthria, GERD, gout, depression with anxiety, subclavian artery stenosis, PVD, left bundle blockade, tobacco abuse, sCHF with EF 30-35%, GI bleeding, CAD, myocardial infarction, CKD--3, dementia, who presents with generalized weakness and shortness breath.  Per patient's wife, patient has been having generalized weakness for more than 3 weeks, which has been progressively getting worse.  He used to be able to walk, but cannot walk anymore.  Patient has physical therapy coming to his home, without significant help.  Patient also has cough, shortness of breath, no chest pain, fever or chills.  Wife states that yesterday he vomited once after she fed patient food.  Currently no nausea, vomiting, diarrhea or abdominal pain, no symptoms of UTI.  No facial droop or slurred speech.  2/16: Patient seen.  In ICU on stepdown status currently.  On vasopressor support.  Tachypneic with increased work of breathing.  Shallow respirations.  Saturation mid 90s on 5 to 6 L nasal cannula.  Critical care consulted overnight who then consulted the palliative care service.  I discussed the case with PCCM attending.  Palliative care NP then reached out to the patient's family.  After discussion family has elected to proceed with DNR/DNI/comfort measures.  Comfort measures initiated.  All aggressive medical interventions have been stopped at this time.     Assessment & Plan:   Principal Problem:   Aspiration pneumonia (HCC) Active Problems:   Acute respiratory failure with hypoxia (HCC)   Type 2 diabetes mellitus with stage 3 chronic kidney disease, without long-term current use of insulin (HCC)   Essential hypertension   GERD (gastroesophageal reflux  disease)   Hypercholesterolemia   Stroke (HCC)   CKD (chronic kidney disease), stage IIIa   Elevated troponin   CAD (coronary artery disease)   Chronic systolic CHF (congestive heart failure) (HCC)   Hyperkalemia   Pressure injury of skin   Goals of care, counseling/discussion   DNR (do not resuscitate) discussion   Palliative care by specialist   End of life care  Acute hypoxemic respiratory failure Hypotension/shock, suspect septic  Patient was initially admitted to stepdown unit and started on IV antibiotics and fluid resuscitated.  Patient remains hypotensive and requiring pressor support.  Palliative care service has discussed with patient's family who elected to proceed with DNR/DNI/comfort measures.  All aggressive medical interventions have been withheld at this time.  Remainder of patient's care will be focused on comfort and symptom management.   DVT prophylaxis: None, comfort measures Code Status: DNR Family Communication: Palliative care has discussed with family Disposition Plan: Anticipate discharge with hospice versus anticipated hospital death   Consultants:   ICU  Palliative care  Procedures:   None  Antimicrobials:   None   Subjective: Seen and examined Response to name and follow simple commands Grunts, unintelligible speech   Objective: Vitals:   12/21/19 1430 12/21/19 1435 12/21/19 1440 12/21/19 1442  BP:  119/70    Pulse: 80 79 81 81  Resp: (!) 29 (!) 29 (!) 28 (!) 29  Temp:      TempSrc:      SpO2: 96% 96% 96% 96%  Weight:      Height:        Intake/Output Summary (Last 24 hours) at 12/21/2019 1450 Last data filed  at 12/21/2019 0837 Gross per 24 hour  Intake 2397.51 ml  Output 0 ml  Net 2397.51 ml   Filed Weights   12/11/2019 1102 12/21/19 0116  Weight: 63 kg 62.9 kg    Examination:  Physical exam deferred given comfort measures status    Data Reviewed: I have personally reviewed following labs and imaging  studies  CBC: Recent Labs  Lab 12/18/2019 1137 12/21/19 0237  WBC 8.8 20.0*  NEUTROABS 5.9  --   HGB 15.4 13.2  HCT 46.3 40.2  MCV 100.2* 101.5*  PLT 202 187   Basic Metabolic Panel: Recent Labs  Lab 12/29/2019 1137 12/21/19 0237  NA 140 143  K 5.3* 5.2*  CL 108 111  CO2 24 23  GLUCOSE 181* 164*  BUN 34* 28*  CREATININE 1.27* 1.30*  CALCIUM 9.5 8.6*   GFR: Estimated Creatinine Clearance: 43 mL/min (A) (by C-G formula based on SCr of 1.3 mg/dL (H)). Liver Function Tests: Recent Labs  Lab 12/21/2019 1137  AST 40  ALT 33  ALKPHOS 90  BILITOT 0.4  PROT 8.3*  ALBUMIN 3.6   No results for input(s): LIPASE, AMYLASE in the last 168 hours. No results for input(s): AMMONIA in the last 168 hours. Coagulation Profile: Recent Labs  Lab 12/21/19 1009  INR 1.2   Cardiac Enzymes: No results for input(s): CKTOTAL, CKMB, CKMBINDEX, TROPONINI in the last 168 hours. BNP (last 3 results) No results for input(s): PROBNP in the last 8760 hours. HbA1C: Recent Labs    12/21/19 0237  HGBA1C 6.1*   CBG: Recent Labs  Lab 12/12/2019 2219 12/21/19 0103 12/21/19 0728  GLUCAP 154* 140* 136*   Lipid Profile: Recent Labs    12/21/19 0237  CHOL 120  HDL 35*  LDLCALC 70  TRIG 76  CHOLHDL 3.4   Thyroid Function Tests: Recent Labs    12/09/2019 1602  TSH 4.313  FREET4 0.91   Anemia Panel: Recent Labs    12/06/2019 1602  FERRITIN 143   Sepsis Labs: Recent Labs  Lab 12/06/2019 1602 12/21/19 0237  PROCALCITON <0.10 0.21    Recent Results (from the past 240 hour(s))  Respiratory Panel by RT PCR (Flu A&B, Covid) - Nasopharyngeal Swab     Status: None   Collection Time: 12/26/2019  1:28 PM   Specimen: Nasopharyngeal Swab  Result Value Ref Range Status   SARS Coronavirus 2 by RT PCR NEGATIVE NEGATIVE Final    Comment: (NOTE) SARS-CoV-2 target nucleic acids are NOT DETECTED. The SARS-CoV-2 RNA is generally detectable in upper respiratoy specimens during the acute phase of  infection. The lowest concentration of SARS-CoV-2 viral copies this assay can detect is 131 copies/mL. A negative result does not preclude SARS-Cov-2 infection and should not be used as the sole basis for treatment or other patient management decisions. A negative result may occur with  improper specimen collection/handling, submission of specimen other than nasopharyngeal swab, presence of viral mutation(s) within the areas targeted by this assay, and inadequate number of viral copies (<131 copies/mL). A negative result must be combined with clinical observations, patient history, and epidemiological information. The expected result is Negative. Fact Sheet for Patients:  https://www.moore.com/ Fact Sheet for Healthcare Providers:  https://www.young.biz/ This test is not yet ap proved or cleared by the [place] FDA and  has been authorized for detection and/or diagnosis of SARS-CoV-2 by FDA under an Emergency Use Authorization (EUA). This EUA will remain  in effect (meaning this test can be used) for the duration of  the COVID-19 declaration under Section 564(b)(1) of the Act, 21 U.S.C. section 360bbb-3(b)(1), unless the authorization is terminated or revoked sooner.    Influenza A by PCR NEGATIVE NEGATIVE Final   Influenza B by PCR NEGATIVE NEGATIVE Final    Comment: (NOTE) The Xpert Xpress SARS-CoV-2/FLU/RSV assay is intended as an aid in  the diagnosis of influenza from Nasopharyngeal swab specimens and  should not be used as a sole basis for treatment. Nasal washings and  aspirates are unacceptable for Xpert Xpress SARS-CoV-2/FLU/RSV  testing. Fact Sheet for Patients: https://www.moore.com/ Fact Sheet for Healthcare Providers: https://www.young.biz/ This test is not yet approved or cleared by the [place] FDA and  has been authorized for detection and/or diagnosis of SARS-CoV-2 by  FDA under  an Emergency Use Authorization (EUA). This EUA will remain  in effect (meaning this test can be used) for the duration of the  Covid-19 declaration under Section 564(b)(1) of the Act, 21  U.S.C. section 360bbb-3(b)(1), unless the authorization is  terminated or revoked. Performed at [facility], [address] [place], [place] [address]   Culture, blood (routine x 2) Call MD if unable to obtain prior to antibiotics being given     Status: None (Preliminary result)   Collection Time: January 04, 2020  7:10 PM   Specimen: BLOOD  Result Value Ref Range Status   Specimen Description BLOOD LEFT ANTECUBITAL  Final   Special Requests   Final    BOTTLES DRAWN AEROBIC AND ANAEROBIC Blood Culture adequate volume   Culture   Final    NO GROWTH < 24 HOURS Performed at [facility], [address], [place], [place] [address]    Report Status PENDING  Incomplete  Culture, blood (routine x 2) Call MD if unable to obtain prior to antibiotics being given     Status: None (Preliminary result)   Collection Time: 04-Jan-2020  7:10 PM   Specimen: BLOOD  Result Value Ref Range Status   Specimen Description BLOOD RIGHT ANTECUBITAL  Final   Special Requests   Final    BOTTLES DRAWN AEROBIC AND ANAEROBIC Blood Culture adequate volume   Culture   Final    NO GROWTH < 24 HOURS Performed at [facility], [address], [place], [place] [address]    Report Status PENDING  Incomplete  MRSA PCR Screening     Status: None   Collection Time: 12/21/19  1:08 AM   Specimen: Nasopharyngeal  Result Value Ref Range Status   MRSA by PCR NEGATIVE NEGATIVE Final    Comment:        The GeneXpert MRSA Assay (FDA approved for NASAL specimens only), is one component of a comprehensive MRSA colonization surveillance program. It is not intended to diagnose MRSA infection nor to guide or monitor treatment for MRSA infections. Performed at [facility], [address] [place],  [place] [address]          Radiology Studies: CT Angio Chest PE W/Cm &/Or Wo Cm  Result Date: 01-04-20 CLINICAL DATA:  Weakness EXAM: CT ANGIOGRAPHY CHEST WITH CONTRAST TECHNIQUE: Multidetector CT imaging of the chest was performed using the standard protocol during bolus administration of intravenous contrast. Multiplanar CT image reconstructions and MIPs were obtained to evaluate the vascular anatomy. CONTRAST:  25mL OMNIPAQUE IOHEXOL 350 MG/ML SOLN COMPARISON:  08/07/2011 CT, 05/27/2019 CT, plain film from 12/10/2019 FINDINGS: Cardiovascular: Atherosclerotic calcifications of the thoracic aorta are noted without aneurysmal dilatation. Coronary calcifications are seen. Pulmonary artery demonstrates a normal  branching pattern without definitive pulmonary emboli. No pericardial effusion is seen. Mediastinum/Nodes: Thoracic inlet is within normal limits. No hilar or mediastinal adenopathy is noted. The esophagus as visualized is within normal limits. Lungs/Pleura: Lungs are well aerated bilaterally. Mild emphysematous changes are seen. Stable scarring/nodularity is noted in the right upper lobe best seen on image number 13 of series 6. This is stable from the prior exam of 2012 and felt to be benign in etiology. Mild patchy reticulonodular infiltrate is noted in the bases bilaterally. This could be related to aspiration. Upper Abdomen: Visualized upper abdomen is within normal limits. Musculoskeletal: Degenerative changes of the thoracic spine are noted. Superior compression deformity of L1 is seen. This is stable from the recent plain film examination but new from a prior exam of 05/27/2019 Review of the MIP images confirms the above findings. IMPRESSION: No evidence of pulmonary emboli. Mild reticulonodular infiltrate in the bases bilaterally which may be related to aspiration. Stable nodular density in the right upper lobe unchanged from 2012 consistent with a benign etiology. L1 compression deformity similar  to that seen on recent plain film but new from prior CT examination. Aortic Atherosclerosis (ICD10-I70.0). Electronically Signed   By: Inez Catalina M.D.   On: 12/06/2019 15:02   DG Chest Port 1 View  Result Date: 12/07/2019 CLINICAL DATA:  Hypoxia. EXAM: PORTABLE CHEST 1 VIEW COMPARISON:  Chest x-ray dated June 09, 2019. FINDINGS: Macro cardiac increased interstitial opacities at the lung bases. No focal consolidation, pleural effusion, or pneumothorax. No acute osseous abnormality. IMPRESSION: 1. Increased interstitial opacities at the lung bases could reflect mild interstitial edema or atypical infection such as viral pneumonia. Electronically Signed   By: Titus Dubin M.D.   On: 01/01/2020 11:24   ECHOCARDIOGRAM COMPLETE  Result Date: 12/21/2019    ECHOCARDIOGRAM REPORT   Patient Name:   Kevin Case Date of Exam: 12/21/2019 Medical Rec #:  [id number]       Height:       70.0 in Accession #:    [id number]      Weight:       138.7 lb Date of Birth:  12-08-1940       BSA:          1.79 m Patient Age:    30 years        BP:           103/67 mmHg Patient Gender: M               HR:           69 bpm. Exam Location:  [facility] Procedure: 2D Echo, Color Doppler and Cardiac Doppler Indications:     Elevated troponin  History:         Patient has prior history of Echocardiogram examinations. ICM,                  HFpEF and CHF, CAD, PAD; Risk Factors:Hypertension and Current                  Smoker.  Sonographer:     Charmayne Sheer RDCS (AE) Referring Phys:  [id number] Rise Mu Diagnosing Phys: Nelva Bush MD  Sonographer Comments: Suboptimal parasternal window and no subcostal window. Image acquisition challenging due to respiratory motion. IMPRESSIONS  1. Left ventricular ejection fraction, by estimation, is 25 to 30%. The left ventricle has severely decreased function. LV endocardial border not optimally defined to evaluate regional wall motion. There is mild left ventricular  hypertrophy. Left ventricular  diastolic parameters are consistent with Grade I diastolic dysfunction (impaired relaxation).  2. Right ventricular systolic function is normal. The right ventricular size is normal.  3. Left atrial size was mildly dilated.  4. The mitral valve was not well visualized. No evidence of mitral valve regurgitation. No evidence of mitral stenosis.  5. The aortic valve was not well visualized. Aortic valve regurgitation is mild. Mild aortic valve stenosis.  6. Pulmonic valve regurgitation not well assessed. FINDINGS  Left Ventricle: Left ventricular ejection fraction, by estimation, is 25 to 30%. The left ventricle has severely decreased function. LV endocardial border not optimally defined to evaluate regional wall motion. The left ventricular internal cavity size was normal in size. There is mild left ventricular hypertrophy. Abnormal (paradoxical) septal motion, consistent with left bundle branch block. Left ventricular diastolic parameters are consistent with Grade I diastolic dysfunction (impaired relaxation). Right Ventricle: The right ventricular size is normal. Right vetricular wall thickness was not assessed. Right ventricular systolic function is normal. Left Atrium: Left atrial size was mildly dilated. Right Atrium: Right atrial size was normal in size. Pericardium: The pericardium was not well visualized. Mitral Valve: The mitral valve was not well visualized. Moderate mitral annular calcification. No evidence of mitral valve regurgitation. No evidence of mitral valve stenosis. MV peak gradient, 6.1 mmHg. The mean mitral valve gradient is 2.0 mmHg. Tricuspid Valve: The tricuspid valve is not well visualized. Tricuspid valve regurgitation is trivial. Aortic Valve: The aortic valve was not well visualized. Aortic valve regurgitation is mild. Aortic regurgitation PHT measures 632 msec. Mild aortic stenosis is present. There is moderate calcification of the aortic valve. Aortic valve mean gradient measures 11.8  mmHg. Aortic valve peak gradient measures 22.2 mmHg. Aortic valve area, by VTI measures 1.60 cm. Pulmonic Valve: The pulmonic valve was not well visualized. Pulmonic valve regurgitation not well assessed. Aorta: The aortic root was not well visualized. Pulmonary Artery: The pulmonary artery is not well seen. IAS/Shunts: The interatrial septum was not well visualized.  LEFT VENTRICLE PLAX 2D LV PW:         1.00 cm      Diastology LV IVS:        1.20 cm      LV e' lateral:   3.92 cm/s LVOT diam:     1.90 cm      LV E/e' lateral: 15.3 LV SV:         69.18 ml     LV e' medial:    3.81 cm/s LVOT Area:     2.84 cm     LV E/e' medial:  15.8  LV Volumes (MOD) LV vol d, MOD A2C: 117.0 ml LV vol d, MOD A4C: 107.0 ml LV vol s, MOD A2C: 86.1 ml LV vol s, MOD A4C: 81.4 ml LV SV MOD A2C:     30.9 ml LV SV MOD A4C:     107.0 ml LV SV MOD BP:      28.4 ml RIGHT VENTRICLE RV Basal diam:  2.56 cm LEFT ATRIUM             Index       RIGHT ATRIUM           Index LA Vol (A2C):   41.4 ml 23.17 ml/m RA Area:     10.80 cm LA Vol (A4C):   39.9 ml 22.33 ml/m RA Volume:   22.40 ml  12.54 ml/m LA Biplane Vol: 40.8 ml 22.84 ml/m  AORTIC  VALVE                    PULMONIC VALVE AV Area (Vmax):    1.67 cm     PV Vmax:       1.26 m/s AV Area (Vmean):   1.69 cm     PV Vmean:      86.200 cm/s AV Area (VTI):     1.60 cm     PV VTI:        0.241 m AV Vmax:           235.75 cm/s  PV Peak grad:  6.4 mmHg AV Vmean:          157.250 cm/s PV Mean grad:  3.0 mmHg AV VTI:            0.431 m AV Peak Grad:      22.2 mmHg AV Mean Grad:      11.8 mmHg LVOT Vmax:         139.00 cm/s LVOT Vmean:        94.000 cm/s LVOT VTI:          0.244 m LVOT/AV VTI ratio: 0.57 AI PHT:            632 msec  AORTA Ao Root diam: 2.70 cm MITRAL VALVE MV Area (PHT): 3.03 cm     SHUNTS MV Peak grad:  6.1 mmHg     Systemic VTI:  0.24 m MV Mean grad:  2.0 mmHg     Systemic Diam: 1.90 cm MV Vmax:       1.23 m/s MV Vmean:      67.3 cm/s MV Decel Time: 250 msec MV E velocity:  60.10 cm/s MV A velocity: 100.00 cm/s MV E/A ratio:  0.60 Cristal Deer End MD Electronically signed by Yvonne Kendall MD Signature Date/Time: 12/21/2019/11:05:20 AM    Final         Scheduled Meds: Continuous Infusions:  sodium chloride 10 mL/hr at 12/21/19 0837   morphine 2 mg/hr (12/21/19 1202)     LOS: 1 day    Time spent: 25 minutes    Tresa Moore, MD [facility] Pager [phone]  If 7PM-7AM, please contact night-coverage 12/21/2019, 2:50 PM
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
## 2019-12-25 LAB — CULTURE, BLOOD (ROUTINE X 2)
Culture: NO GROWTH
Culture: NO GROWTH
Special Requests: ADEQUATE
Special Requests: ADEQUATE

## 2019-12-31 ENCOUNTER — Ambulatory Visit (INDEPENDENT_AMBULATORY_CARE_PROVIDER_SITE_OTHER): Payer: Medicare Other | Admitting: Vascular Surgery

## 2019-12-31 ENCOUNTER — Encounter (INDEPENDENT_AMBULATORY_CARE_PROVIDER_SITE_OTHER): Payer: Medicare Other

## 2020-01-03 LAB — BLOOD GAS, VENOUS
Acid-Base Excess: 1.8 mmol/L (ref 0.0–2.0)
Bicarbonate: 28.6 mmol/L — ABNORMAL HIGH (ref 20.0–28.0)
O2 Saturation: 33.1 %
Patient temperature: 37
pCO2, Ven: 53 mmHg (ref 44.0–60.0)
pH, Ven: 7.34 (ref 7.250–7.430)

## 2020-01-03 NOTE — Discharge Summary (Signed)
Discharge summary/death note  Patient was initially admitted to the stepdown unit under the care of the hospitalist service.  On my evaluation on 12/21/2019 patient was in stepdown status currently requiring pressor support.  Breathing pattern as well as tachypneic and with shallow respirations.  Mental status, patient responded to name with audible grunts but was unable to hold a conversation.  Palliative care was consulted and spoke to the patient's family who elected for comfort measures.  Patient was placed on comfort measures at this time with no further invasive diagnostic or therapeutic testing.  Remainder of care focused on patient comfort.  Patient's wife was at bedside and remained stable.  On 2020/01/07 patient pronounced deceased at 03/02/19.  Family at bedside.  Chaplain at bedside.  Lolita Patella MD

## 2020-01-03 NOTE — Progress Notes (Signed)
Chaplain provided supportive presence to wife at time of death. She was grieving his death while recognizing "he is no longer suffering." Chaplain aided wife in getting in contact with family who came to assist her in getting home. Family is using [employer] cabin crew. Family had no questions or concerns as they left hospital.     01-09-2020 0100  Clinical Encounter Type  Visited With Family  Visit Type Follow-up  Referral From Nurse  Spiritual Encounters  Spiritual Needs Emotional;Grief support

## 2020-01-03 NOTE — Progress Notes (Signed)
Pt pronounced at 1220 am family at bedside grieving appropriately chaplin at bedside.wife left bedside shortly after  with belongings given.

## 2020-01-03 DEATH — deceased

## 2020-01-10 ENCOUNTER — Institutional Professional Consult (permissible substitution): Payer: Medicare Other | Admitting: Pulmonary Disease

## 2020-02-15 ENCOUNTER — Ambulatory Visit: Payer: Medicare Other | Admitting: Cardiovascular Disease

## 2020-03-01 ENCOUNTER — Ambulatory Visit: Payer: Self-pay | Admitting: Physician Assistant

## 2020-10-02 IMAGING — DX DG CHEST 1V PORT
1 series · 1 of 1 positions shown · non-contrast
Comparison: Chest x-ray dated June 09, 2019.

CLINICAL DATA: Hypoxia.

EXAM:
PORTABLE CHEST 1 VIEW

[chest ap]
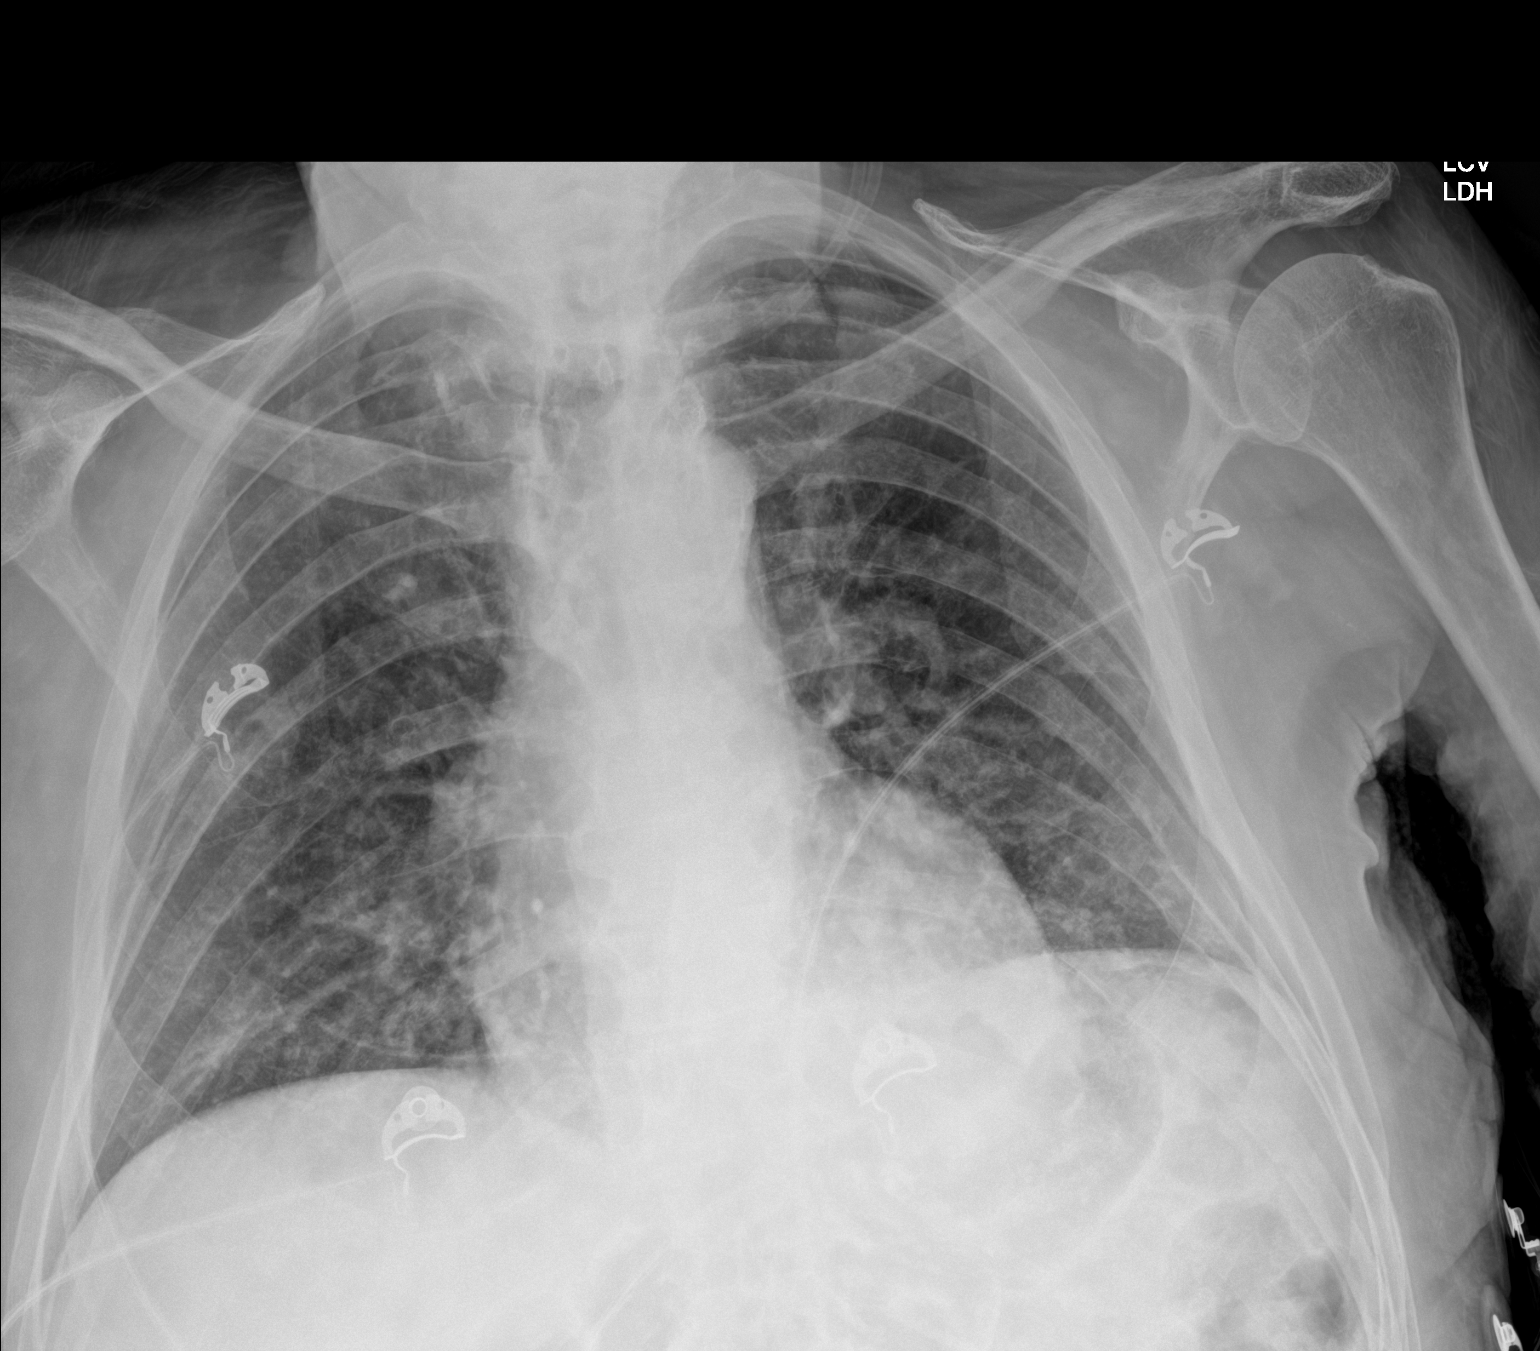

[1 of 1 positions shown; findings below may reference images not displayed]

FINDINGS: Macro cardiac increased interstitial opacities at the lung bases. No
focal consolidation, pleural effusion, or pneumothorax. No acute
osseous abnormality.
IMPRESSION: 1. Increased interstitial opacities at the lung bases could reflect
mild interstitial edema or atypical infection such as viral
pneumonia.
